# Patient Record
Sex: Female | Born: 1955 | Race: White | Hispanic: No | Smoking: Former smoker
Health system: Southern US, Community
[De-identification: ages and names within clinical notes are randomized; demographics above are authoritative.]

## PROBLEM LIST (undated history)

## (undated) ENCOUNTER — Emergency Department (HOSPITAL_COMMUNITY): Admission: EM | Payer: Self-pay

## (undated) DIAGNOSIS — M069 Rheumatoid arthritis, unspecified: Secondary | ICD-10-CM

## (undated) DIAGNOSIS — Q750 Craniosynostosis: Secondary | ICD-10-CM

## (undated) DIAGNOSIS — K2270 Barrett's esophagus without dysplasia: Secondary | ICD-10-CM

## (undated) DIAGNOSIS — Z8489 Family history of other specified conditions: Secondary | ICD-10-CM

## (undated) DIAGNOSIS — M545 Low back pain, unspecified: Secondary | ICD-10-CM

## (undated) DIAGNOSIS — E7800 Pure hypercholesterolemia, unspecified: Secondary | ICD-10-CM

## (undated) DIAGNOSIS — K589 Irritable bowel syndrome without diarrhea: Secondary | ICD-10-CM

## (undated) DIAGNOSIS — J45909 Unspecified asthma, uncomplicated: Secondary | ICD-10-CM

## (undated) DIAGNOSIS — K219 Gastro-esophageal reflux disease without esophagitis: Secondary | ICD-10-CM

## (undated) DIAGNOSIS — Z8719 Personal history of other diseases of the digestive system: Secondary | ICD-10-CM

## (undated) DIAGNOSIS — IMO0002 Reserved for concepts with insufficient information to code with codable children: Secondary | ICD-10-CM

## (undated) DIAGNOSIS — I10 Essential (primary) hypertension: Secondary | ICD-10-CM

## (undated) DIAGNOSIS — B029 Zoster without complications: Secondary | ICD-10-CM

## (undated) DIAGNOSIS — Z9289 Personal history of other medical treatment: Secondary | ICD-10-CM

## (undated) DIAGNOSIS — K8042 Calculus of bile duct with acute cholecystitis without obstruction: Secondary | ICD-10-CM

## (undated) DIAGNOSIS — F419 Anxiety disorder, unspecified: Secondary | ICD-10-CM

## (undated) DIAGNOSIS — G43909 Migraine, unspecified, not intractable, without status migrainosus: Secondary | ICD-10-CM

## (undated) DIAGNOSIS — Q75009 Craniosynostosis unspecified: Secondary | ICD-10-CM

## (undated) DIAGNOSIS — G8929 Other chronic pain: Secondary | ICD-10-CM

## (undated) DIAGNOSIS — R112 Nausea with vomiting, unspecified: Secondary | ICD-10-CM

## (undated) DIAGNOSIS — H34219 Partial retinal artery occlusion, unspecified eye: Secondary | ICD-10-CM

## (undated) DIAGNOSIS — J449 Chronic obstructive pulmonary disease, unspecified: Secondary | ICD-10-CM

## (undated) DIAGNOSIS — Z9889 Other specified postprocedural states: Secondary | ICD-10-CM

## (undated) DIAGNOSIS — M5126 Other intervertebral disc displacement, lumbar region: Secondary | ICD-10-CM

## (undated) HISTORY — DX: Craniosynostosis unspecified: Q75.009

## (undated) HISTORY — DX: Chronic obstructive pulmonary disease, unspecified: J44.9

## (undated) HISTORY — PX: CRANIECTOMY FOR CRANIOSYNOSTOSIS: SUR323

## (undated) HISTORY — DX: Other intervertebral disc displacement, lumbar region: M51.26

## (undated) HISTORY — PX: APPENDECTOMY: SHX54

## (undated) HISTORY — PX: COLONOSCOPY: SHX174

## (undated) HISTORY — PX: BACK SURGERY: SHX140

## (undated) HISTORY — PX: UPPER GASTROINTESTINAL ENDOSCOPY: SHX188

## (undated) HISTORY — DX: Craniosynostosis: Q75.0

## (undated) HISTORY — PX: POSTERIOR LUMBAR FUSION: SHX6036

## (undated) HISTORY — DX: Partial retinal artery occlusion, unspecified eye: H34.219

## (undated) HISTORY — DX: Calculus of bile duct with acute cholecystitis without obstruction: K80.42

## (undated) HISTORY — DX: Pure hypercholesterolemia, unspecified: E78.00

---

## 1989-08-01 DIAGNOSIS — M5126 Other intervertebral disc displacement, lumbar region: Secondary | ICD-10-CM

## 1989-08-01 HISTORY — DX: Other intervertebral disc displacement, lumbar region: M51.26

## 1989-08-01 HISTORY — PX: BACK SURGERY: SHX140

## 1989-08-01 HISTORY — PX: POSTERIOR LUMBAR FUSION: SHX6036

## 1990-12-01 HISTORY — PX: ABDOMINAL HYSTERECTOMY: SHX81

## 1998-06-12 ENCOUNTER — Encounter: Admission: RE | Admit: 1998-06-12 | Discharge: 1998-09-07 | Payer: Self-pay | Admitting: Anesthesiology

## 1998-09-11 ENCOUNTER — Encounter: Payer: Self-pay | Admitting: Neurosurgery

## 1998-09-11 ENCOUNTER — Ambulatory Visit (HOSPITAL_COMMUNITY): Admission: RE | Admit: 1998-09-11 | Discharge: 1998-09-11 | Payer: Self-pay | Admitting: Neurosurgery

## 1999-08-06 ENCOUNTER — Ambulatory Visit (HOSPITAL_COMMUNITY): Admission: RE | Admit: 1999-08-06 | Discharge: 1999-08-06 | Payer: Self-pay | Admitting: Neurosurgery

## 1999-08-06 ENCOUNTER — Encounter: Payer: Self-pay | Admitting: Neurosurgery

## 2004-12-31 HISTORY — PX: TOTAL HIP ARTHROPLASTY: SHX124

## 2005-12-01 HISTORY — PX: CHOLECYSTECTOMY: SHX55

## 2006-04-07 ENCOUNTER — Encounter: Admission: RE | Admit: 2006-04-07 | Discharge: 2006-04-07 | Payer: Self-pay | Admitting: Neurosurgery

## 2010-07-04 ENCOUNTER — Emergency Department (HOSPITAL_COMMUNITY): Admission: EM | Admit: 2010-07-04 | Discharge: 2010-07-04 | Payer: Self-pay | Admitting: Emergency Medicine

## 2010-12-22 ENCOUNTER — Encounter: Payer: Self-pay | Admitting: Neurosurgery

## 2011-02-07 ENCOUNTER — Emergency Department (HOSPITAL_COMMUNITY): Payer: Worker's Compensation

## 2011-02-07 ENCOUNTER — Emergency Department (HOSPITAL_COMMUNITY)
Admission: EM | Admit: 2011-02-07 | Discharge: 2011-02-07 | Disposition: A | Discharge status: Home / Self Care | Payer: Worker's Compensation | Attending: Emergency Medicine | Admitting: Emergency Medicine

## 2011-02-07 DIAGNOSIS — S5000XA Contusion of unspecified elbow, initial encounter: Secondary | ICD-10-CM | POA: Insufficient documentation

## 2011-02-07 DIAGNOSIS — S139XXA Sprain of joints and ligaments of unspecified parts of neck, initial encounter: Secondary | ICD-10-CM | POA: Insufficient documentation

## 2011-02-07 DIAGNOSIS — W010XXA Fall on same level from slipping, tripping and stumbling without subsequent striking against object, initial encounter: Secondary | ICD-10-CM | POA: Insufficient documentation

## 2011-02-07 DIAGNOSIS — G8929 Other chronic pain: Secondary | ICD-10-CM | POA: Insufficient documentation

## 2011-02-07 DIAGNOSIS — I10 Essential (primary) hypertension: Secondary | ICD-10-CM | POA: Insufficient documentation

## 2011-02-07 DIAGNOSIS — M25519 Pain in unspecified shoulder: Secondary | ICD-10-CM | POA: Insufficient documentation

## 2011-02-07 DIAGNOSIS — S300XXA Contusion of lower back and pelvis, initial encounter: Secondary | ICD-10-CM | POA: Insufficient documentation

## 2011-02-07 DIAGNOSIS — E785 Hyperlipidemia, unspecified: Secondary | ICD-10-CM | POA: Insufficient documentation

## 2011-02-07 DIAGNOSIS — Z79899 Other long term (current) drug therapy: Secondary | ICD-10-CM | POA: Insufficient documentation

## 2011-02-07 DIAGNOSIS — M542 Cervicalgia: Secondary | ICD-10-CM | POA: Insufficient documentation

## 2011-02-07 DIAGNOSIS — Y990 Civilian activity done for income or pay: Secondary | ICD-10-CM | POA: Insufficient documentation

## 2011-02-07 DIAGNOSIS — M79609 Pain in unspecified limb: Secondary | ICD-10-CM | POA: Insufficient documentation

## 2011-02-07 DIAGNOSIS — M25529 Pain in unspecified elbow: Secondary | ICD-10-CM | POA: Insufficient documentation

## 2011-02-07 DIAGNOSIS — M549 Dorsalgia, unspecified: Secondary | ICD-10-CM | POA: Insufficient documentation

## 2011-04-17 ENCOUNTER — Ambulatory Visit
Admission: RE | Admit: 2011-04-17 | Discharge: 2011-04-17 | Disposition: A | Discharge status: Home / Self Care | Payer: Worker's Compensation | Source: Ambulatory Visit | Attending: Family Medicine | Admitting: Family Medicine

## 2011-04-17 ENCOUNTER — Other Ambulatory Visit: Payer: Self-pay | Admitting: Family Medicine

## 2011-04-17 DIAGNOSIS — R05 Cough: Secondary | ICD-10-CM

## 2011-04-17 DIAGNOSIS — R053 Chronic cough: Secondary | ICD-10-CM

## 2011-08-26 ENCOUNTER — Emergency Department (HOSPITAL_BASED_OUTPATIENT_CLINIC_OR_DEPARTMENT_OTHER)
Admission: EM | Admit: 2011-08-26 | Discharge: 2011-08-26 | Disposition: A | Discharge status: Home / Self Care | Payer: No Typology Code available for payment source | Attending: Emergency Medicine | Admitting: Emergency Medicine

## 2011-08-26 DIAGNOSIS — I10 Essential (primary) hypertension: Secondary | ICD-10-CM | POA: Insufficient documentation

## 2011-08-26 DIAGNOSIS — M25519 Pain in unspecified shoulder: Secondary | ICD-10-CM | POA: Insufficient documentation

## 2011-08-26 DIAGNOSIS — F17200 Nicotine dependence, unspecified, uncomplicated: Secondary | ICD-10-CM | POA: Insufficient documentation

## 2011-08-26 DIAGNOSIS — G8929 Other chronic pain: Secondary | ICD-10-CM | POA: Insufficient documentation

## 2011-08-26 DIAGNOSIS — R109 Unspecified abdominal pain: Secondary | ICD-10-CM | POA: Insufficient documentation

## 2011-08-26 HISTORY — DX: Essential (primary) hypertension: I10

## 2011-08-26 MED ORDER — HYDROCODONE-ACETAMINOPHEN 5-325 MG PO TABS: 2.0000 | ORAL_TABLET | ORAL | Status: AC | PRN

## 2011-08-26 NOTE — ED Notes (Signed)
Pt reports she sustained a fall at work last year and has had pain in right leg, bilateral groin and hips and left shoulder

## 2011-08-26 NOTE — ED Provider Notes (Signed)
History     CSN: [id number] Arrival date & time: 08/26/2011  1:04 PM  Chief Complaint  Patient presents with  . Leg Pain  . Shoulder Pain  . Groin Pain    HPI  (Consider location/radiation/quality/duration/timing/severity/associated sxs/prior treatment)  HPI Pt reports multiple chronic pain complaints since falling at work 18 months ago. She has pain in R leg, hips, groin, L shoulder. No new injuries. She has been seen by a PA who has been prescribing her pain meds and xanax, but she is out. She was advised by her attorney to come to the ER. Pain is severe, aching and worse with movement.   Past Medical History  Diagnosis Date  . Hypertension     Past Surgical History  Procedure Date  . Abdominal hysterectomy   . Cholecystectomy   . Joint replacement   . Back surgery     No family history on file.  History  Substance Use Topics  . Smoking status: Current Everyday Smoker -- 1.0 packs/day  . Smokeless tobacco: Not on file  . Alcohol Use: Yes     occasional    OB History    Grav Para Term Preterm Abortions TAB SAB Ect Mult Living                  Review of Systems  Review of Systems All other systems reviewed and are negative except as noted in HPI.   Allergies  Penicillins; Morphine and related; and Sulfa antibiotics  Home Medications   Current Outpatient Rx  Name Route Sig Dispense Refill  . ALPRAZOLAM 0.25 MG PO TABS Oral Take 1 mg by mouth at bedtime as needed.      Marland Kitchen HYDROCODONE-ACETAMINOPHEN 5-500 MG PO TABS Oral Take 1 tablet by mouth 3 (three) times daily.        Physical Exam    BP 122/89  Pulse 103  Temp(Src) 98.5 F (36.9 C) (Oral)  Resp 16  Ht 5\' 2"  (1.575 m)  Wt 127 lb (57.607 kg)  BMI 23.23 kg/m2  SpO2 100%  Physical Exam  Nursing note and vitals reviewed. Constitutional: She is oriented to person, place, and time. She appears well-developed and well-nourished.  HENT:  Head: Normocephalic and atraumatic.  Eyes: EOM are normal.  Pupils are equal, round, and reactive to light.  Neck: Normal range of motion. Neck supple.  Cardiovascular: Normal rate, normal heart sounds and intact distal pulses.   Pulmonary/Chest: Effort normal and breath sounds normal.  Abdominal: Bowel sounds are normal. She exhibits no distension. There is no tenderness.  Musculoskeletal: Normal range of motion. She exhibits no edema and no tenderness.       Pt reports pain with ROM of multiple joints, but no deformity and ROM is normal, no physical exam evidence of acute injury  Neurological: She is alert and oriented to person, place, and time. She has normal strength. No cranial nerve deficit or sensory deficit.  Skin: Skin is warm and dry. No rash noted.  Psychiatric: She has a normal mood and affect.    ED Course  Procedures (including critical care time)    MDM Pt's pain syndrome is chronic. There is no evidence of acute injury. No need for further ED eval. Advised to see PCP and/or Ortho for further evaluation and for long term control of her chronic pain.         Natalija Mavis B. Bernette Mayers, MD 08/26/11 1408

## 2011-10-24 ENCOUNTER — Emergency Department (HOSPITAL_COMMUNITY)
Admission: EM | Admit: 2011-10-24 | Discharge: 2011-10-25 | Discharge status: Against Medical Advice | Payer: Self-pay | Attending: Emergency Medicine | Admitting: Emergency Medicine

## 2011-10-24 DIAGNOSIS — Z0389 Encounter for observation for other suspected diseases and conditions ruled out: Secondary | ICD-10-CM | POA: Insufficient documentation

## 2012-04-14 ENCOUNTER — Ambulatory Visit: Payer: Self-pay

## 2012-04-27 ENCOUNTER — Encounter (HOSPITAL_BASED_OUTPATIENT_CLINIC_OR_DEPARTMENT_OTHER): Payer: Self-pay | Admitting: *Deleted

## 2012-04-27 ENCOUNTER — Emergency Department (HOSPITAL_BASED_OUTPATIENT_CLINIC_OR_DEPARTMENT_OTHER)
Admission: EM | Admit: 2012-04-27 | Discharge: 2012-04-27 | Disposition: A | Discharge status: Home / Self Care | Payer: Self-pay | Attending: Emergency Medicine | Admitting: Emergency Medicine

## 2012-04-27 DIAGNOSIS — J019 Acute sinusitis, unspecified: Secondary | ICD-10-CM

## 2012-04-27 DIAGNOSIS — I10 Essential (primary) hypertension: Secondary | ICD-10-CM | POA: Insufficient documentation

## 2012-04-27 DIAGNOSIS — R062 Wheezing: Secondary | ICD-10-CM | POA: Insufficient documentation

## 2012-04-27 DIAGNOSIS — R51 Headache: Secondary | ICD-10-CM | POA: Insufficient documentation

## 2012-04-27 DIAGNOSIS — F17200 Nicotine dependence, unspecified, uncomplicated: Secondary | ICD-10-CM | POA: Insufficient documentation

## 2012-04-27 DIAGNOSIS — J029 Acute pharyngitis, unspecified: Secondary | ICD-10-CM | POA: Insufficient documentation

## 2012-04-27 DIAGNOSIS — H9209 Otalgia, unspecified ear: Secondary | ICD-10-CM | POA: Insufficient documentation

## 2012-04-27 DIAGNOSIS — J3489 Other specified disorders of nose and nasal sinuses: Secondary | ICD-10-CM | POA: Insufficient documentation

## 2012-04-27 MED ORDER — AZITHROMYCIN 250 MG PO TABS: 250.0000 mg | ORAL_TABLET | Freq: Every day | ORAL | Status: AC

## 2012-04-27 MED ORDER — ALBUTEROL SULFATE HFA 108 (90 BASE) MCG/ACT IN AERS
2.0000 | INHALATION_SPRAY | Freq: Once | RESPIRATORY_TRACT | Status: AC
  Administered 2012-04-27: 2 via RESPIRATORY_TRACT
  Filled 2012-04-27: qty 6.7

## 2012-04-27 MED ORDER — AZITHROMYCIN 250 MG PO TABS: 250.0000 mg | ORAL_TABLET | Freq: Every day | ORAL | Status: DC

## 2012-04-27 NOTE — ED Provider Notes (Signed)
History     CSN: [id number]  Arrival date & time 04/27/12  4098   First MD Initiated Contact with Patient 04/27/12 2213      Chief Complaint  Patient presents with  . Facial Pain    (Consider location/radiation/quality/duration/timing/severity/associated sxs/prior treatment) Patient is a 56 y.o. female presenting with URI. The history is provided by the patient. No language interpreter was used.  URI The primary symptoms include fever, headaches, ear pain, sore throat, cough and wheezing. The current episode started 3 to 5 days ago. This is a new problem. The problem has been gradually worsening.  The cough began 3 to 5 days ago. The cough is new. The cough is productive. The sputum is green.  Symptoms associated with the illness include facial pain, sinus pressure, congestion and rhinorrhea. The illness is not associated with chills.  Pt complains of sinus pain.  Pt reports she has had sinus infections in the past  Past Medical History  Diagnosis Date  . Hypertension     Past Surgical History  Procedure Date  . Abdominal hysterectomy   . Cholecystectomy   . Joint replacement   . Back surgery     No family history on file.  History  Substance Use Topics  . Smoking status: Current Everyday Smoker -- 1.0 packs/day  . Smokeless tobacco: Not on file  . Alcohol Use: Yes     occasional    OB History    Grav Para Term Preterm Abortions TAB SAB Ect Mult Living                  Review of Systems  Constitutional: Positive for fever. Negative for chills.  HENT: Positive for ear pain, congestion, sore throat, rhinorrhea and sinus pressure.   Respiratory: Positive for cough and wheezing.   Neurological: Positive for headaches.  All other systems reviewed and are negative.    Allergies  Penicillins; Demerol; Morphine and related; Sulfa antibiotics; and Benadryl  Home Medications   Current Outpatient Rx  Name Route Sig Dispense Refill  . BUTALBITAL-APAP-CAFFEINE  50-325-40 MG PO TABS Oral Take 1 tablet by mouth daily as needed. For migraines    . CIMETIDINE 800 MG PO TABS Oral Take 800 mg by mouth 3 (three) times daily as needed. For acid reflux    . LIDOCAINE 5 % EX PTCH Transdermal Place 1 patch onto the skin daily. Remove & Discard patch within 12 hours or as directed by MD    . ADULT MULTIVITAMIN W/MINERALS CH Oral Take 1 tablet by mouth daily.    Marland Kitchen OMEPRAZOLE 40 MG PO CPDR Oral Take 40 mg by mouth 2 (two) times daily.    Marland Kitchen PRESCRIPTION MEDICATION Oral Take 1 tablet by mouth daily. Blood pressure medication    . PRESCRIPTION MEDICATION Oral Take 1 tablet by mouth daily. Cholesterol medication    . VITAMIN D (ERGOCALCIFEROL) PO Oral Take 1 tablet by mouth daily.      BP 161/83  Pulse 96  Temp(Src) 98 F (36.7 C) (Oral)  Resp 22  SpO2 99%  Physical Exam  Nursing note and vitals reviewed. Constitutional: She is oriented to person, place, and time. She appears well-developed and well-nourished.  HENT:  Head: Normocephalic and atraumatic.  Right Ear: External ear normal.  Left Ear: External ear normal.  Nose: Nose normal.  Mouth/Throat: Oropharynx is clear and moist.       Tender maxillary sinuses  Eyes: Conjunctivae and EOM are normal. Pupils are equal, round, and  reactive to light.  Neck: Normal range of motion. Neck supple.  Cardiovascular: Normal rate and normal heart sounds.   Pulmonary/Chest: Effort normal. She has wheezes.  Abdominal: Soft.  Musculoskeletal: Normal range of motion.  Neurological: She is alert and oriented to person, place, and time. She has normal reflexes.  Skin: Skin is warm.  Psychiatric: She has a normal mood and affect.    ED Course  Procedures (including critical care time)  Labs Reviewed - No data to display No results found.   No diagnosis found.    MDM  Pt request z pack,  Pt given 2 puffs albuterol here.   Pt advised to see her MD for recheck        Lonia Skinner [place], [place] 04/27/12 2226

## 2012-04-27 NOTE — ED Notes (Signed)
Nasal drainage, facial pressure, cough, ear pain, and sore throat x 5 days. No relief with otc medication. Hx of sinusitis.

## 2012-04-27 NOTE — Discharge Instructions (Signed)

## 2012-04-27 NOTE — ED Provider Notes (Signed)
Medical screening examination/treatment/procedure(s) were performed by non-physician practitioner and as supervising physician I was immediately available for consultation/collaboration.   Rolan Bucco, MD 04/27/12 [phone]

## 2012-04-28 NOTE — ED Notes (Signed)
Pt called concerned about allergic reaction to medications. Pt states she was itching all over while in the ED after the inhaler, but did not notify anyone. Pt states she ate on the way home and then took the zithromax.with one episode of vomiting. Pt has vistaril at home, advised pt to take vistaril for the itching, avoid the inhaler and try to take the zithromax again tomorrow. Pt instructed to contact PMD tomorrow if symptoms persist.

## 2012-05-20 ENCOUNTER — Ambulatory Visit (INDEPENDENT_AMBULATORY_CARE_PROVIDER_SITE_OTHER): Payer: No Typology Code available for payment source | Admitting: Family Medicine

## 2012-05-20 ENCOUNTER — Encounter: Payer: Self-pay | Admitting: Family Medicine

## 2012-05-20 VITALS — BP 142/92 | HR 94 | Ht 60.83 in | Wt 130.0 lb

## 2012-05-20 DIAGNOSIS — F419 Anxiety disorder, unspecified: Secondary | ICD-10-CM

## 2012-05-20 DIAGNOSIS — E7800 Pure hypercholesterolemia, unspecified: Secondary | ICD-10-CM

## 2012-05-20 DIAGNOSIS — R0789 Other chest pain: Secondary | ICD-10-CM

## 2012-05-20 DIAGNOSIS — M549 Dorsalgia, unspecified: Secondary | ICD-10-CM

## 2012-05-20 DIAGNOSIS — G8929 Other chronic pain: Secondary | ICD-10-CM | POA: Insufficient documentation

## 2012-05-20 DIAGNOSIS — G2581 Restless legs syndrome: Secondary | ICD-10-CM

## 2012-05-20 DIAGNOSIS — G4700 Insomnia, unspecified: Secondary | ICD-10-CM

## 2012-05-20 DIAGNOSIS — I10 Essential (primary) hypertension: Secondary | ICD-10-CM

## 2012-05-20 DIAGNOSIS — K2270 Barrett's esophagus without dysplasia: Secondary | ICD-10-CM

## 2012-05-20 DIAGNOSIS — F17209 Nicotine dependence, unspecified, with unspecified nicotine-induced disorders: Secondary | ICD-10-CM

## 2012-05-20 DIAGNOSIS — F17200 Nicotine dependence, unspecified, uncomplicated: Secondary | ICD-10-CM

## 2012-05-20 DIAGNOSIS — R071 Chest pain on breathing: Secondary | ICD-10-CM

## 2012-05-20 DIAGNOSIS — K449 Diaphragmatic hernia without obstruction or gangrene: Secondary | ICD-10-CM

## 2012-05-20 DIAGNOSIS — F341 Dysthymic disorder: Secondary | ICD-10-CM

## 2012-05-20 NOTE — Progress Notes (Signed)
  Subjective:    Patient ID: Diana [name], female    DOB: [birth year], 56 y.o.   MRN: [id number]  HPI   Chest Tenderness: centrally located, worse with deep inspiration, not related to activity, does not radiate to shoulder or jaw, not associated with diaphoresis or nausea, no history of MI or trauma   Review of Systems Positive for chronic back pain Negative all other systems    Objective:   Physical Exam BP 142/92  Pulse 94  Ht 5' 0.83" (1.545 m)  Wt 130 lb (58.968 kg)  BMI 24.70 kg/m2 Gen: alert, oriented x 4, middle age WF, non distressed CV: RRR, no murmurs Lungs: CTA-B Chest: exquisite tenderness to palpation along right sterno-clavicular border      Assessment & Plan:  56 y.o. F who is new to our clinic who presents for her first visit and has chest tenderness which may be representative of costochondritis.

## 2012-05-20 NOTE — Patient Instructions (Addendum)
Dear Mrs. Diana [name],   Thank you for coming to clinic today. Please read below regarding the issues that we discussed.   1. Chest Tenderness - I believe that this is costochondritis, which is inflammation of the joints between your sternum and ribs. You can take up to 800 mg three times a day. I would take at least 400 mg three times a day for 2 weeks.   2. Blood Test - I will read about the concern for stryker hips and follow-up at the next visit.   Please follow up in clinic in 3 weeks . Please call earlier if you have any questions or concerns.   Sincerely,   Dr. [name] Sawyer

## 2012-05-28 DIAGNOSIS — E785 Hyperlipidemia, unspecified: Secondary | ICD-10-CM | POA: Insufficient documentation

## 2012-05-28 DIAGNOSIS — R0789 Other chest pain: Secondary | ICD-10-CM | POA: Insufficient documentation

## 2012-05-28 DIAGNOSIS — F17209 Nicotine dependence, unspecified, with unspecified nicotine-induced disorders: Secondary | ICD-10-CM | POA: Insufficient documentation

## 2012-05-28 DIAGNOSIS — G4700 Insomnia, unspecified: Secondary | ICD-10-CM | POA: Insufficient documentation

## 2012-05-28 DIAGNOSIS — E7800 Pure hypercholesterolemia, unspecified: Secondary | ICD-10-CM

## 2012-05-28 DIAGNOSIS — F419 Anxiety disorder, unspecified: Secondary | ICD-10-CM | POA: Insufficient documentation

## 2012-05-28 DIAGNOSIS — I10 Essential (primary) hypertension: Secondary | ICD-10-CM | POA: Insufficient documentation

## 2012-05-28 DIAGNOSIS — G2581 Restless legs syndrome: Secondary | ICD-10-CM | POA: Insufficient documentation

## 2012-05-28 HISTORY — DX: Pure hypercholesterolemia, unspecified: E78.00

## 2012-05-28 NOTE — Assessment & Plan Note (Signed)
Although this is my first time meeting the patient, I know that she has several chronic pain issues. This always makes is harder to delineate possible new acute problems. Currently, her chest pain is so localized and reproducible, that it will be treated as costochondritis. She should take ibuprofen 800 mg TID for 7 days. Follow-up in 4 weeks to reassess.

## 2012-06-07 ENCOUNTER — Ambulatory Visit (INDEPENDENT_AMBULATORY_CARE_PROVIDER_SITE_OTHER): Payer: Self-pay | Admitting: Family Medicine

## 2012-06-07 ENCOUNTER — Encounter: Payer: Self-pay | Admitting: Family Medicine

## 2012-06-07 VITALS — BP 146/85 | HR 86 | Ht 62.0 in | Wt 129.0 lb

## 2012-06-07 DIAGNOSIS — F341 Dysthymic disorder: Secondary | ICD-10-CM

## 2012-06-07 DIAGNOSIS — G2581 Restless legs syndrome: Secondary | ICD-10-CM

## 2012-06-07 DIAGNOSIS — R252 Cramp and spasm: Secondary | ICD-10-CM

## 2012-06-07 DIAGNOSIS — E7800 Pure hypercholesterolemia, unspecified: Secondary | ICD-10-CM

## 2012-06-07 DIAGNOSIS — G8929 Other chronic pain: Secondary | ICD-10-CM

## 2012-06-07 DIAGNOSIS — K589 Irritable bowel syndrome without diarrhea: Secondary | ICD-10-CM

## 2012-06-07 DIAGNOSIS — F17209 Nicotine dependence, unspecified, with unspecified nicotine-induced disorders: Secondary | ICD-10-CM

## 2012-06-07 DIAGNOSIS — R0789 Other chest pain: Secondary | ICD-10-CM

## 2012-06-07 DIAGNOSIS — M549 Dorsalgia, unspecified: Secondary | ICD-10-CM

## 2012-06-07 DIAGNOSIS — E785 Hyperlipidemia, unspecified: Secondary | ICD-10-CM

## 2012-06-07 DIAGNOSIS — F329 Major depressive disorder, single episode, unspecified: Secondary | ICD-10-CM

## 2012-06-07 DIAGNOSIS — F419 Anxiety disorder, unspecified: Secondary | ICD-10-CM

## 2012-06-07 DIAGNOSIS — F17200 Nicotine dependence, unspecified, uncomplicated: Secondary | ICD-10-CM

## 2012-06-07 MED ORDER — LOVASTATIN 10 MG PO TABS: 10.0000 mg | ORAL_TABLET | Freq: Every day | ORAL | Status: DC

## 2012-06-07 MED ORDER — HYDROCODONE-ACETAMINOPHEN 10-325 MG PO TABS: 1.0000 | ORAL_TABLET | Freq: Four times a day (QID) | ORAL | Status: DC | PRN

## 2012-06-07 NOTE — Assessment & Plan Note (Signed)
Has tried Chantix in past according to records from former PCP Philemon Kingdom, MD.

## 2012-06-07 NOTE — Assessment & Plan Note (Signed)
Check CK to rule-out statin myopathy. 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

## 2012-06-07 NOTE — Assessment & Plan Note (Signed)
No time for a full work up as this was the last of many chronic issues that she wanted adressed today. No red flags for MI. MSK still possible and patient most likely has a pain syndrome. Possibly GI related. Consider GI referral. Readdress at future visit.

## 2012-06-07 NOTE — Progress Notes (Signed)
  Subjective:    Patient ID: Diana [name], female    DOB: [birth year], 56 y.o.   MRN: [id number]  HPI  56 year old patient presenting with many complaints and concerns:  1. 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 The symptoms include a rash on her legs, cramping the back of the calves, hands and feet; spots in her vision and headache that she describes as distinct from her migraines. The chronological sequence of symptoms is not well noted. the vision problems are described as dots in front of the eye, described as a black space that is very transient. It last occurred 3 months ago and lasted a few minutes. It was painful, relieved after sleeping and taking alprazolam, and was accompanied by nausea and vomiting. It was not monocular blindness. At baseline she wears glasses and has poor vision at night.   2. Chest Pain - Seen for this at last visit and working diagnosis was costochondritis; patient tried on ibuprofen 800 mg TID, pain not relieved, comes in episodes that last from 12 hours to several days, located under right breast, denies shortness of breath, radiations, moderately relieved by tagament, exacerbated by nothing; has hx of work-up for MI multiples times that she reports as negative   3. Chronic Lower Back Pain - The patient is requesting a refill for hydrocodone-acetaminophen which she takes for lower back pain.   Back Pain: Location: lower Duration: since mid 1990's Quality: 8/10 Preceding Events: hx falls. hx trauma Alleviating Factors: percocet-acetaminophen Exacerbating Factors: movement Hx of intervention: L4-L5 Cage fusion in 1998 surgery, yes PT Hx of imaging: many - myelogram in 2007 negative for impingement in lumbar spine; X ray 2011 negative for acute changes  Red Flags: no recent  falls, no numbness and tingling, no impaired bowel or bladder function    Review of Systems Positive for left neck and shoulder pain (former physician thought it may be related to rotator cuff)    Objective:   Physical Exam  Constitutional: She is oriented to person, place, and time. She appears well-developed and well-nourished. No distress.  HENT:  Head: Normocephalic and atraumatic.  Eyes: Conjunctivae, EOM and lids are normal. Pupils are equal, round, and reactive to light.  Fundoscopic exam:      The right eye shows no arteriolar narrowing, no AV nicking, no hemorrhage and no papilledema.       The left eye shows no arteriolar narrowing, no AV nicking, no hemorrhage and no papilledema.  Musculoskeletal:       Hips non tender bilaterally without edema or erythema   Neurological: She is alert and oriented to person, place, and time. She has normal reflexes.  Skin: Skin is warm and dry. She is not diaphoretic.   BP 146/85  Pulse 86  Ht 5\' 2"  (1.575 m)  Wt 129 lb (58.514 kg)  BMI 23.59 kg/m2        Assessment & Plan:  56 year old female with many medical complaints that she is trying to relate to her hip implant, but there is an extremely low likeliod so no work-up will be performed at the moment for that.

## 2012-06-07 NOTE — Assessment & Plan Note (Signed)
Check direct LDL and refill statin.

## 2012-06-07 NOTE — Assessment & Plan Note (Signed)
The patient claims to have had this since age 56. Medical records show previous treatment with Dicyclomine.

## 2012-06-07 NOTE — Assessment & Plan Note (Signed)
Obtain UDS to screen for recreational drugs. Refill norco. Forgot to have the patient sign a pain contract. Have the patient sign contract and perform functional assessment at the next visit.

## 2012-06-07 NOTE — Patient Instructions (Addendum)
Dear Diana [name],   Thank you for coming to clinic today. Please read below regarding the issues that we discussed.   1. Lab Results - I will send you a letter with there results of your lab.   2. Chest Pain - We will address this at a future visit.   Please follow up in clinic in 4 weeks . Please call earlier if you have any questions or concerns.   Sincerely,   Dr. [name] Sawyer

## 2012-06-08 LAB — DRUG SCR UR, PAIN MGMT, REFLEX CONF
Benzodiazepines.: NEGATIVE
Cocaine Metabolites: NEGATIVE
Creatinine,U: 81.42 mg/dL
Marijuana Metabolite: NEGATIVE
Opiates: NEGATIVE
Phencyclidine (PCP): NEGATIVE
Propoxyphene: NEGATIVE

## 2012-06-12 ENCOUNTER — Encounter: Payer: Self-pay | Admitting: Family Medicine

## 2012-06-30 ENCOUNTER — Ambulatory Visit (HOSPITAL_COMMUNITY)
Admission: RE | Admit: 2012-06-30 | Discharge: 2012-06-30 | Disposition: A | Discharge status: Home / Self Care | Payer: Self-pay | Source: Ambulatory Visit | Attending: Family Medicine | Admitting: Family Medicine

## 2012-06-30 ENCOUNTER — Ambulatory Visit (INDEPENDENT_AMBULATORY_CARE_PROVIDER_SITE_OTHER): Payer: Self-pay | Admitting: Family Medicine

## 2012-06-30 ENCOUNTER — Encounter: Payer: Self-pay | Admitting: Family Medicine

## 2012-06-30 VITALS — BP 129/82 | HR 70 | Ht 61.0 in | Wt 133.0 lb

## 2012-06-30 DIAGNOSIS — E7800 Pure hypercholesterolemia, unspecified: Secondary | ICD-10-CM

## 2012-06-30 DIAGNOSIS — M549 Dorsalgia, unspecified: Secondary | ICD-10-CM

## 2012-06-30 DIAGNOSIS — G8929 Other chronic pain: Secondary | ICD-10-CM

## 2012-06-30 DIAGNOSIS — Z9181 History of falling: Secondary | ICD-10-CM | POA: Insufficient documentation

## 2012-06-30 DIAGNOSIS — M25559 Pain in unspecified hip: Secondary | ICD-10-CM | POA: Insufficient documentation

## 2012-06-30 DIAGNOSIS — M545 Low back pain, unspecified: Secondary | ICD-10-CM | POA: Insufficient documentation

## 2012-06-30 DIAGNOSIS — E785 Hyperlipidemia, unspecified: Secondary | ICD-10-CM

## 2012-06-30 DIAGNOSIS — Z96649 Presence of unspecified artificial hip joint: Secondary | ICD-10-CM | POA: Insufficient documentation

## 2012-06-30 MED ORDER — HYDROCODONE-ACETAMINOPHEN 10-325 MG PO TABS: 1.0000 | ORAL_TABLET | Freq: Three times a day (TID) | ORAL | Status: DC | PRN

## 2012-06-30 MED ORDER — LOVASTATIN 20 MG PO TABS: 20.0000 mg | ORAL_TABLET | Freq: Every day | ORAL | Status: DC

## 2012-06-30 MED ORDER — LOVASTATIN 10 MG PO TABS: 20.0000 mg | ORAL_TABLET | Freq: Every day | ORAL | Status: DC

## 2012-06-30 NOTE — Assessment & Plan Note (Addendum)
Worsening pain and needing to stay in bed. Taking Norco 10-325 BID or TID PRN, lidoderm patch, and voltaren. Patient also has recent falls therefore ordered a repeat imaging of her lumbar spine and right hip. Additionally have given her a prescription for Norco 10-325 3 times a day when necessary for 2 months. No other additional narcotic medication should be prescribing this period Unless there is an acute injury.

## 2012-06-30 NOTE — Progress Notes (Signed)
  Subjective:    Patient ID: Diana [name], female    DOB: 06-Mar-1956, 56 y.o.   MRN: [id number]  HPI  56 year old female chronic back pain and high cholesterol who presents for evaluation.   Back Pain: Location: lower back and right hip Duration: years, acute on chronic Starting in 1998 she had L4-L5 fusion. She reports worsening pain and multiple falls. Quality: 6/10 Preceding Events: Yes falls. Previous trauma Alleviating Factors: Rest, Norco, Lidoderm Exacerbating Factors: Activity Intervention: L4-L5 Cage fusion in 1998 surgery, yes PT, These massage and acupuncture in the past when she can afford it, has also tried Neurontin but did not tolerate it.  Hx of imaging: many - myelogram in 2007 negative for impingement in lumbar spine; X ray 2011 negative for acute changes  Red Flags: recent falls, tingling Since before the surgery 1998, no impaired bowel or bladder function    2. High cholesterol- the patient recently had an LDL measured 185. She is currently taking lovastatin 10 mg daily. She is not missing doses. She does note a history of CVA in her mother.   Review of Systems  All other systems reviewed and are negative.       Objective:   Physical Exam  Constitutional: She is oriented to person, place, and time. She appears well-developed and well-nourished. No distress.  HENT:  Head: Normocephalic and atraumatic.  Eyes: Conjunctivae are normal. Pupils are equal, round, and reactive to light.  Cardiovascular: Normal rate, regular rhythm and normal heart sounds.   Pulmonary/Chest: Effort normal and breath sounds normal.  Musculoskeletal:       Right hip: She exhibits decreased strength, tenderness and bony tenderness. She exhibits normal range of motion.       Lumbar back: She exhibits tenderness and bony tenderness. She exhibits normal range of motion, no edema and no deformity.       Back:  Neurological: She is alert and oriented to person, place, and time. She has  normal reflexes.  Skin: She is not diaphoretic.    BP 129/82  Pulse 70  Ht 5\' 1"  (1.549 m)  Wt 133 lb (60.328 kg)  BMI 25.13 kg/m2      Assessment & Plan:

## 2012-06-30 NOTE — Assessment & Plan Note (Signed)
Recent LDL is greater 180. Therefore we'll increase her lovastatin to 20 mg daily. This will likely not be sufficient and I see [place] increasing it to 40 within several months. I'll recheck her lipids a fasting lipid panel in 3-4 months.

## 2012-08-27 ENCOUNTER — Ambulatory Visit (INDEPENDENT_AMBULATORY_CARE_PROVIDER_SITE_OTHER): Payer: Self-pay | Admitting: Family Medicine

## 2012-08-27 ENCOUNTER — Encounter: Payer: Self-pay | Admitting: Family Medicine

## 2012-08-27 VITALS — BP 132/84 | HR 68 | Temp 98.5°F | Ht 61.0 in | Wt 133.0 lb

## 2012-08-27 DIAGNOSIS — N898 Other specified noninflammatory disorders of vagina: Secondary | ICD-10-CM

## 2012-08-27 DIAGNOSIS — G8929 Other chronic pain: Secondary | ICD-10-CM

## 2012-08-27 DIAGNOSIS — Z23 Encounter for immunization: Secondary | ICD-10-CM

## 2012-08-27 DIAGNOSIS — N952 Postmenopausal atrophic vaginitis: Secondary | ICD-10-CM

## 2012-08-27 DIAGNOSIS — G2581 Restless legs syndrome: Secondary | ICD-10-CM

## 2012-08-27 DIAGNOSIS — Z720 Tobacco use: Secondary | ICD-10-CM

## 2012-08-27 DIAGNOSIS — F17209 Nicotine dependence, unspecified, with unspecified nicotine-induced disorders: Secondary | ICD-10-CM

## 2012-08-27 DIAGNOSIS — F17200 Nicotine dependence, unspecified, uncomplicated: Secondary | ICD-10-CM

## 2012-08-27 DIAGNOSIS — M549 Dorsalgia, unspecified: Secondary | ICD-10-CM

## 2012-08-27 LAB — POCT URINALYSIS DIPSTICK
Bilirubin, UA: NEGATIVE
Blood, UA: NEGATIVE
Glucose, UA: NEGATIVE
Nitrite, UA: NEGATIVE
Urobilinogen, UA: 0.2

## 2012-08-27 LAB — POCT WET PREP (WET MOUNT)

## 2012-08-27 MED ORDER — ESTROGENS, CONJUGATED 0.625 MG/GM VA CREA: TOPICAL_CREAM | VAGINAL | Status: DC

## 2012-08-27 MED ORDER — VARENICLINE TARTRATE 0.5 MG PO TABS: 0.5000 mg | ORAL_TABLET | Freq: Two times a day (BID) | ORAL | Status: DC

## 2012-08-27 MED ORDER — CLONAZEPAM 0.5 MG PO TABS: 0.5000 mg | ORAL_TABLET | Freq: Two times a day (BID) | ORAL | Status: DC | PRN

## 2012-08-27 MED ORDER — HYDROCODONE-ACETAMINOPHEN 10-325 MG PO TABS: 1.0000 | ORAL_TABLET | Freq: Three times a day (TID) | ORAL | Status: DC | PRN

## 2012-08-28 DIAGNOSIS — N952 Postmenopausal atrophic vaginitis: Secondary | ICD-10-CM | POA: Insufficient documentation

## 2012-08-28 LAB — DRUG SCREEN, URINE
Amphetamine Screen, Ur: NEGATIVE
Barbiturate Quant, Ur: POSITIVE — AB
Cocaine Metabolites: NEGATIVE
Marijuana Metabolite: NEGATIVE
Opiates: POSITIVE — AB
Phencyclidine (PCP): NEGATIVE

## 2012-08-28 NOTE — Assessment & Plan Note (Signed)
Start premarin cream twice weekly with OTC lubricant as well. Follow up in 1 month.

## 2012-08-28 NOTE — Assessment & Plan Note (Signed)
Refill Norco 10-325 for BID use. 3 months prescription given.

## 2012-08-28 NOTE — Progress Notes (Signed)
  Subjective:    Patient ID: Diana [name], female    DOB: [birth year], 56 y.o.   MRN: [id number]  HPI 56 year old F with chronic MSK pain, restless leg syndrome and tobacco use disorder among other problems who presents for evaluation.   1. Smoking Cessation - Has > 20 pack year history. Smoke 0.5 ppd. Has quit previously using Chantix and felt that is it worked very well. Denies any side effects of mood changes, hallucinations, or problems with decision making while on Chantix. Would like to restart Chantix.  2. Chronic MSK Pain - Pain in lower back and right hip/groin. Has a history of lumbar spinal fusion and right hip total arthroplasty. Currently takes hydrocodone/acetominophen 10-325 BID for pain and has been on this regimen for several year prior to being my patient. Pain is mostly located in the groin, but not interrupting her ALD's since last visit.  Would like a refill on the hydrocodone/acetominophen.   3. Restless Leg Syndrome -  Treated by previous PCP: Diana Height, PA with hydroxyzine, vicodin and clonazepam. Last saw this provider 03/16/12. Appears to have been tried on Zyprexa and Requip in the past and taken off 2/2 side effects. The patient was also tried on Mirapex by pervious PCP but patient stopped that b/c she says it caused her to gamble and spend excessive money. She most recently took her clonazapem two nights ago and needs a refill.   4. Vaginal Discharge - Yellow color. 2 weeks duration. Associated with itching and dryness of the vagina. Is sexually active with one partner - her 1st husband to whom she is no longer married. Notes pain with intercourse secondary to dryness. Has not use OTC lube. Does not use condoms. Hysterectomy > 20 years ago. Denies malodorous d/c, fever, or abdominal pain.   Review of Systems Negative for chest pain, SOB, urinary retention, saddle anesthesia    Objective:   Physical Exam BP 132/84  Pulse 68  Temp 98.5 F (36.9 C) (Oral)  Ht 5\' 1"   (1.549 m)  Wt 133 lb (60.328 kg)  BMI 25.13 kg/m2 Gen: middle aged WF, appears older than stated age, non distressed MSK: TTP of lumbar spine and hips bilaterally, no obvious deformities, erythema or swelling GU: normal external genitalia, no inguinal lymphadenopathy, no lesions, normal vagina without blood or discharge, mild atrophy of vaginal tissue     Assessment & Plan:  56 year old F with complaints of vaginal discharge and atrophic vaginitis as well as chronic back pain and restless leg syndrome.

## 2012-08-28 NOTE — Assessment & Plan Note (Signed)
Patient to restart Chantix. Successful with this in past. Follow up in 1 month.

## 2012-08-28 NOTE — Assessment & Plan Note (Signed)
Continue current regimen of Clonazepam. Obtain urine drug screen today.

## 2012-12-23 ENCOUNTER — Ambulatory Visit (INDEPENDENT_AMBULATORY_CARE_PROVIDER_SITE_OTHER): Payer: Self-pay | Admitting: Family Medicine

## 2012-12-23 ENCOUNTER — Encounter: Payer: Self-pay | Admitting: Family Medicine

## 2012-12-23 ENCOUNTER — Telehealth: Payer: Self-pay | Admitting: Family Medicine

## 2012-12-23 VITALS — BP 139/83 | HR 90 | Temp 98.5°F | Ht 61.0 in | Wt 139.0 lb

## 2012-12-23 DIAGNOSIS — J209 Acute bronchitis, unspecified: Secondary | ICD-10-CM

## 2012-12-23 DIAGNOSIS — F17209 Nicotine dependence, unspecified, with unspecified nicotine-induced disorders: Secondary | ICD-10-CM

## 2012-12-23 DIAGNOSIS — F17200 Nicotine dependence, unspecified, uncomplicated: Secondary | ICD-10-CM

## 2012-12-23 DIAGNOSIS — R05 Cough: Secondary | ICD-10-CM

## 2012-12-23 DIAGNOSIS — R059 Cough, unspecified: Secondary | ICD-10-CM

## 2012-12-23 DIAGNOSIS — J329 Chronic sinusitis, unspecified: Secondary | ICD-10-CM

## 2012-12-23 MED ORDER — ALBUTEROL SULFATE (2.5 MG/3ML) 0.083% IN NEBU
2.5000 mg | INHALATION_SOLUTION | Freq: Once | RESPIRATORY_TRACT | Status: AC
  Administered 2012-12-23: 2.5 mg via RESPIRATORY_TRACT

## 2012-12-23 MED ORDER — PREDNISONE 20 MG PO TABS: 40.0000 mg | ORAL_TABLET | Freq: Every day | ORAL | Status: DC

## 2012-12-23 MED ORDER — IPRATROPIUM BROMIDE 0.02 % IN SOLN
0.5000 mg | Freq: Once | RESPIRATORY_TRACT | Status: AC
  Administered 2012-12-23: 0.5 mg via RESPIRATORY_TRACT

## 2012-12-23 MED ORDER — ALBUTEROL SULFATE HFA 108 (90 BASE) MCG/ACT IN AERS: 2.0000 | INHALATION_SPRAY | Freq: Four times a day (QID) | RESPIRATORY_TRACT | Status: DC | PRN

## 2012-12-23 MED ORDER — AZITHROMYCIN 250 MG PO TABS
500.0000 mg | ORAL_TABLET | Freq: Every day | ORAL | Status: DC
Start: 2012-12-23 — End: 2013-01-13

## 2012-12-23 MED ORDER — GUAIFENESIN-CODEINE 200-10 MG/5ML PO LIQD: 5.0000 mL | Freq: Three times a day (TID) | ORAL | Status: DC | PRN

## 2012-12-23 NOTE — Telephone Encounter (Signed)
Cough med called to [facility] per Dr Cristal Ford

## 2012-12-23 NOTE — Patient Instructions (Addendum)
I think you have a sinus infection. This may trigger your asthma and bronchitis. Use albuterol inhaler every 2-4 hours for next day. Start taking azithromycin. If you have fevers, worsened symptoms or do not improve then call doctor for another appointment. If you have trouble breathing or chest pain then return to ER.   Sinusitis Sinusitis is redness, soreness, and swelling (inflammation) of the paranasal sinuses. Paranasal sinuses are air pockets within the bones of your face (beneath the eyes, the middle of the forehead, or above the eyes). In healthy paranasal sinuses, mucus is able to drain out, and air is able to circulate through them by way of your nose. However, when your paranasal sinuses are inflamed, mucus and air can become trapped. This can allow bacteria and other germs to grow and cause infection. Sinusitis can develop quickly and last only a short time (acute) or continue over a long period (chronic). Sinusitis that lasts for more than 12 weeks is considered chronic.  CAUSES  Causes of sinusitis include:  Allergies.  Structural abnormalities, such as displacement of the cartilage that separates your nostrils (deviated septum), which can decrease the air flow through your nose and sinuses and affect sinus drainage.  Functional abnormalities, such as when the small hairs (cilia) that line your sinuses and help remove mucus do not work properly or are not present. SYMPTOMS  Symptoms of acute and chronic sinusitis are the same. The primary symptoms are pain and pressure around the affected sinuses. Other symptoms include:  Upper toothache.  Earache.  Headache.  Bad breath.  Decreased sense of smell and taste.  A cough, which worsens when you are lying flat.  Fatigue.  Fever.  Thick drainage from your nose, which often is green and may contain pus (purulent).  Swelling and warmth over the affected sinuses. DIAGNOSIS  Your caregiver will perform a physical exam.  During the exam, your caregiver may:  Look in your nose for signs of abnormal growths in your nostrils (nasal polyps).  Tap over the affected sinus to check for signs of infection.  View the inside of your sinuses (endoscopy) with a special imaging device with a light attached (endoscope), which is inserted into your sinuses. If your caregiver suspects that you have chronic sinusitis, one or more of the following tests may be recommended:  Allergy tests.  Nasal culture A sample of mucus is taken from your nose and sent to a lab and screened for bacteria.  Nasal cytology A sample of mucus is taken from your nose and examined by your caregiver to determine if your sinusitis is related to an allergy. TREATMENT  Most cases of acute sinusitis are related to a viral infection and will resolve on their own within 10 days. Sometimes medicines are prescribed to help relieve symptoms (pain medicine, decongestants, nasal steroid sprays, or saline sprays).  However, for sinusitis related to a bacterial infection, your caregiver will prescribe antibiotic medicines. These are medicines that will help kill the bacteria causing the infection.  Rarely, sinusitis is caused by a fungal infection. In theses cases, your caregiver will prescribe antifungal medicine. For some cases of chronic sinusitis, surgery is needed. Generally, these are cases in which sinusitis recurs more than 3 times per year, despite other treatments. HOME CARE INSTRUCTIONS   Drink plenty of water. Water helps thin the mucus so your sinuses can drain more easily.  Use a humidifier.  Inhale steam 3 to 4 times a day (for example, sit in the bathroom with  the shower running).  Apply a warm, moist washcloth to your face 3 to 4 times a day, or as directed by your caregiver.  Use saline nasal sprays to help moisten and clean your sinuses.  Take over-the-counter or prescription medicines for pain, discomfort, or fever only as directed by  your caregiver. SEEK IMMEDIATE MEDICAL CARE IF:  You have increasing pain or severe headaches.  You have nausea, vomiting, or drowsiness.  You have swelling around your face.  You have vision problems.  You have a stiff neck.  You have difficulty breathing. MAKE SURE YOU:   Understand these instructions.  Will watch your condition.  Will get help right away if you are not doing well or get worse. Document Released: 11/17/2005 Document Revised: 02/09/2012 Document Reviewed: 12/02/2011 [facility] Patient Information 2013 [place], [place].

## 2012-12-23 NOTE — Progress Notes (Signed)
  Subjective:    Patient ID: Diana [name], female    DOB: [birth year], 57 y.o.   MRN: [id number]  HPI  1. Cough. Started nearly 2 weeks ago. Seen at [facility] 4 days ago and diagnosed with flu, given cough syrup with codeine. Today she is still having cough productive of purulent yellow mucus, sinus pressure and headache which is not improved. She was having fevers up to 102, the last one was 2 days ago. She is also having some mild wheezing and chest tightness that is resolved with albuterol. Used albuterol inhaler once this morning, but requests refill for this as she may run out soon. Her main concern today is getting the sinus pressure and cough under control.  She is scheduled to get a new orange card later this month.  History of asthma.  Has not required treatment for attack in many years, never required hospitalization as an adult.  Active smoker for >30 years  Review of Systems Denies chest pain, dyspnea, ear pain, emesis, myalgias.     Objective:   Physical Exam  Vitals reviewed. Constitutional: She is oriented to person, place, and time. She appears well-developed and well-nourished. No distress.  HENT:  Right Ear: External ear normal.  Left Ear: External ear normal.       No OP lesions, slight erythema present. + TTP in bilateral maxillary Sinus, not frontal. Purulent nasal rhinorrhea.  Eyes: EOM are normal.  Neck: Normal range of motion. Neck supple.  Cardiovascular: Normal rate, regular rhythm and normal heart sounds.   No murmur heard. Pulmonary/Chest: Effort normal. No respiratory distress. She has wheezes. She has no rales.       Bilateral exp wheezing throughout. The wheezing resolved nearly completely after duoneb is administered in office.  No focal lung findings.  Abdominal: Soft.  Lymphadenopathy:    She has no cervical adenopathy.  Neurological: She is alert and oriented to person, place, and time.  Skin: No rash noted. She is not diaphoretic.    Psychiatric: She has a normal mood and affect.          Assessment & Plan:

## 2012-12-23 NOTE — Assessment & Plan Note (Signed)
Seems motivated to quit and I encouraged her to continue progress (down from 1,5 ppd to 0.5 ppd) and f/u with PCP for options if desired.

## 2012-12-23 NOTE — Assessment & Plan Note (Signed)
Post-influenza. Empiric azithromycin in this PCN allergic patient.

## 2012-12-23 NOTE — Assessment & Plan Note (Signed)
Smoker with history of asthma and symptoms of sinusitis with bronchitis. Her wheezing resolves with duonebs x1. No hypoxia, distress, sign of pneumonia, or history of hospitalization for asthma since adulthood. Will treat empirically for sinusitis and bronchitis with azithromycin, prednisone 40 mg x 5 days, refill albuterol. Refill codeine-guaf cough syrup. Advised to f/u if not improving after few days or if wheezing worsens as she is at risk for developing pneumonia with her active smoking. Advised to seek emergency care for chest pain, dyspnea or other concerning symptoms.

## 2013-01-13 ENCOUNTER — Encounter (HOSPITAL_COMMUNITY): Payer: Self-pay | Admitting: *Deleted

## 2013-01-13 ENCOUNTER — Telehealth: Payer: Self-pay | Admitting: Sports Medicine

## 2013-01-13 ENCOUNTER — Emergency Department (HOSPITAL_COMMUNITY)
Admission: EM | Admit: 2013-01-13 | Discharge: 2013-01-13 | Disposition: A | Discharge status: Home / Self Care | Payer: Self-pay | Attending: Emergency Medicine | Admitting: Emergency Medicine

## 2013-01-13 ENCOUNTER — Ambulatory Visit: Payer: Self-pay | Admitting: Family Medicine

## 2013-01-13 DIAGNOSIS — F17200 Nicotine dependence, unspecified, uncomplicated: Secondary | ICD-10-CM | POA: Insufficient documentation

## 2013-01-13 DIAGNOSIS — Z8619 Personal history of other infectious and parasitic diseases: Secondary | ICD-10-CM | POA: Insufficient documentation

## 2013-01-13 DIAGNOSIS — Z8739 Personal history of other diseases of the musculoskeletal system and connective tissue: Secondary | ICD-10-CM | POA: Insufficient documentation

## 2013-01-13 DIAGNOSIS — I10 Essential (primary) hypertension: Secondary | ICD-10-CM | POA: Insufficient documentation

## 2013-01-13 DIAGNOSIS — Q759 Congenital malformation of skull and face bones, unspecified: Secondary | ICD-10-CM | POA: Insufficient documentation

## 2013-01-13 DIAGNOSIS — E7800 Pure hypercholesterolemia, unspecified: Secondary | ICD-10-CM | POA: Insufficient documentation

## 2013-01-13 DIAGNOSIS — L089 Local infection of the skin and subcutaneous tissue, unspecified: Secondary | ICD-10-CM | POA: Insufficient documentation

## 2013-01-13 DIAGNOSIS — Z8719 Personal history of other diseases of the digestive system: Secondary | ICD-10-CM | POA: Insufficient documentation

## 2013-01-13 DIAGNOSIS — Z79899 Other long term (current) drug therapy: Secondary | ICD-10-CM | POA: Insufficient documentation

## 2013-01-13 HISTORY — DX: Zoster without complications: B02.9

## 2013-01-13 MED ORDER — OXYCODONE-ACETAMINOPHEN 5-325 MG PO TABS: 2.0000 | ORAL_TABLET | ORAL | Status: DC | PRN

## 2013-01-13 MED ORDER — DOXYCYCLINE HYCLATE 100 MG PO CAPS: 100.0000 mg | ORAL_CAPSULE | Freq: Two times a day (BID) | ORAL | Status: DC

## 2013-01-13 MED ORDER — OXYCODONE-ACETAMINOPHEN 5-325 MG PO TABS
2.0000 | ORAL_TABLET | Freq: Once | ORAL | Status: AC
  Administered 2013-01-13: 2 via ORAL
  Filled 2013-01-13: qty 2

## 2013-01-13 NOTE — Telephone Encounter (Signed)
24 Hour emergency Call Line: Snow day 01/13/13  Pt called stating that she was evaluated in the ED and dx with shingles recently.  Was to follow up with Dr. [name] Sawyer today but clinic closed due to weather.  Pain is significant from shingles and only has 1 Norco left.  ED offered pain medication when dx but due to contract with Dr. [name] Sawyer pt did not accept Rx.    Pt has tramadol at home and will try this.  If continued pain will need to be re-evaluated in ED.  I told the pt it is okay to accept short term (#20) pain medicine if felt appropriate by EDP.  Will need follow up with Dr. [name] Sawyer ASAP.  Also notes she is having problems with her hip prothesis that she would like to discuss with Dr. [name] Sawyer.  Pt agreeable to plans and will follow up in ED if pain is not controlled by Tramadol.  Pt to call and schedule appointment ASAP.  [name], DO [facility] Resident - PGY-2 01/13/2013 9:19 AM

## 2013-01-13 NOTE — ED Provider Notes (Signed)
History     CSN: [id number]  Arrival date & time 01/13/13  1002   First MD Initiated Contact with Patient 01/13/13 1008      Chief Complaint  Patient presents with  . Herpes Zoster    (Consider location/radiation/quality/duration/timing/severity/associated sxs/prior treatment) HPI Comments: Patient is a 57 year old who presents with a 1 day history of right lateral upper thigh rash. The rash started gradually and progressively worsened since the onset. The rash is located on right lateral upper thigh. Patient has tried nothing without relief. Patient denies new exposures to medications, soaps, lotions, detergent. Patient reports associated occasional itching. No aggravating/alleviating factors. Patient denies fever, chills, NVD, sore throat, oral lesions, ocular involvement, throat closing, wheezing, SOB, chest pain, abdominal pain. Patient reports scalp pain also. She was seen 2 days ago at urgent care for the scalp pain where she was diagnosed with zoster.      Past Medical History  Diagnosis Date  . Hypertension   . Ruptured lumbar disc 1990    L4-L5  . Choledocholithiasis with acute cholecystitis     removal of surgery   . Craniosynostosis     congenital  . Hypercholesterolemia 05/28/2012    Has taken Crestor and Lipitor in the past. Switched to lovastatin b/c its $4.    . Shingles     Past Surgical History  Procedure Laterality Date  . Abdominal hysterectomy  1992    Total for chronic pelvic pain   . Cholecystectomy  2007    Performed in [place]   . 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 Back surgery  1998    L4-L5 fusion @ [facility]  . Appendectomy    . Craniectomy for craniosynostosis  1959    surgery x 3; congenital     No family history on file.  History  Substance Use Topics  . Smoking status: Current Every Day Smoker -- 0.50 packs/day for 30 years     Types: Cigarettes  . Smokeless tobacco: Not on file  . Alcohol Use: Yes     Comment: occasional    OB History   Grav Para Term Preterm Abortions TAB SAB Ect Mult Living                  Review of Systems  Skin: Positive for rash.  All other systems reviewed and are negative.    Allergies  Penicillins; Demerol; Gabapentin; Morphine and related; Sulfa antibiotics; Benadryl; and Cephalexin  Home Medications   Current Outpatient Rx  Name  Route  Sig  Dispense  Refill  . albuterol (PROVENTIL HFA;VENTOLIN HFA) 108 (90 BASE) MCG/ACT inhaler   Inhalation   Inhale 3 puffs into the lungs every 6 (six) hours as needed for wheezing.         Marland Kitchen ALPRAZolam (XANAX) 1 MG tablet   Oral   Take 1 mg by mouth daily.         . cimetidine (TAGAMET) 800 MG tablet   Oral   Take 800 mg by mouth 3 (three) times daily as needed. For acid reflux         . clonazePAM (KLONOPIN) 0.5 MG tablet   Oral   Take 1 tablet (0.5 mg total) by mouth 2 (two) times daily as needed.   60 tablet   2   . diclofenac (VOLTAREN) 75 MG EC tablet   Oral  Take 75 mg by mouth 2 (two) times daily as needed.         . Guaifenesin-Codeine 200-10 MG/5ML LIQD   Oral   Take 5-10 mLs by mouth every 8 (eight) hours as needed.   200 mL   0   . HYDROcodone-acetaminophen (NORCO) 10-325 MG per tablet   Oral   Take 1 tablet by mouth every 8 (eight) hours as needed.   60 tablet   2   . lovastatin (MEVACOR) 20 MG tablet   Oral   Take 1 tablet (20 mg total) by mouth at bedtime.   30 tablet   3   . Multiple Vitamin (MULITIVITAMIN WITH MINERALS) TABS   Oral   Take 1 tablet by mouth daily.         Marland Kitchen omeprazole (PRILOSEC) 40 MG capsule   Oral   Take 40 mg by mouth 2 (two) times daily.         . propranolol (INDERAL) 20 MG tablet   Oral   Take 20 mg by mouth 2 (two) times daily.         Marland Kitchen triamterene-hydrochlorothiazide (MAXZIDE-25) 37.5-25 MG per tablet   Oral   Take 1 tablet by mouth  daily.         Marland Kitchen VITAMIN D, ERGOCALCIFEROL, PO   Oral   Take 1 tablet by mouth daily.         Marland Kitchen zolpidem (AMBIEN) 10 MG tablet   Oral   Take 10 mg by mouth at bedtime as needed.         . butalbital-acetaminophen-caffeine (FIORICET, ESGIC) 50-325-40 MG per tablet   Oral   Take 1 tablet by mouth daily as needed. For migraines         . conjugated estrogens (PREMARIN) vaginal cream   Vaginal   Place vaginally 2 (two) times a week.   42.5 g   12   . lidocaine (LIDODERM) 5 %   Transdermal   Place 1 patch onto the skin daily. Remove & Discard patch within 12 hours or as directed by MD           BP 144/73  Pulse 102  Temp(Src) 98.5 F (36.9 C) (Oral)  SpO2 100%  Physical Exam  Nursing note and vitals reviewed. Constitutional: She is oriented to person, place, and time. She appears well-developed and well-nourished. No distress.  HENT:  Head: Normocephalic and atraumatic.  Eyes: Conjunctivae and EOM are normal.  Neck: Normal range of motion.  Cardiovascular: Normal rate and regular rhythm.  Exam reveals no gallop and no friction rub.   No murmur heard. Pulmonary/Chest: Effort normal and breath sounds normal. She has no wheezes. She has no rales. She exhibits no tenderness.  Abdominal: Soft. There is no tenderness.  Musculoskeletal: Normal range of motion.  Neurological: She is alert and oriented to person, place, and time. Coordination normal.  Speech is goal-oriented. Moves limbs without ataxia.   Skin: Skin is warm and dry.  No rash or ulcerations noted on patient's scalp. Area of erythema and central ulceration on skin overlying patient's right lateral hip. Area of right hip is tender to palpation.   Psychiatric: She has a normal mood and affect. Her behavior is normal.    ED Course  Procedures (including critical care time)  Labs Reviewed - No data to display No results found.   1. Skin infection       MDM  10:57 AM I see no signs of zoster on  the patient's scalp where she complains of pain. Patient likely has cellulitis of right hip. I will treat her with Bactrim and Percocet for pain. Patient instructed to follow up with PCP for further evaluation and management. Vitals stable for discharge. Patient afebrile.         Emilia Hoaglin, PA-C 01/13/13 1359

## 2013-01-13 NOTE — ED Notes (Signed)
Pt was diagnosed with shingles 2 days ago and is here for evaluation of severe pain.  Pt has pain on her scalp ("like bees")

## 2013-01-14 ENCOUNTER — Telehealth: Payer: Self-pay | Admitting: Sports Medicine

## 2013-01-14 MED ORDER — CLINDAMYCIN HCL 300 MG PO CAPS: 300.0000 mg | ORAL_CAPSULE | Freq: Three times a day (TID) | ORAL | Status: DC

## 2013-01-14 NOTE — Telephone Encounter (Signed)
24 Hour Emergency Call Line (snow day)  Pt called after being seen in the ED yesterday for her pain from Shingles and Left hip cellulitis.  Given doxycycline but not tolerating.  Vomiting ~1 hour after each episode.   Requesting phenergan.  No appropriate given extensive polysubstance medication list and pt unknown to me.  (Told her policy was to not write different classes of meds; addition of anti-nausea medication vs changing ABX)  Will change her antibiotic to Clindamycin given extensive allergies and coverage for cellulitis.  Pt reports hx of diarrhea with Clindamycin and I encouraged her to increase her pro-biotic usage and to increase natural forms of probiotics.  To follow up next week with PCP.  [name], DO [facility] Resident - PGY-2 01/14/2013 3:37 PM

## 2013-01-15 NOTE — ED Provider Notes (Signed)
Medical screening examination/treatment/procedure(s) were conducted as a shared visit with non-physician practitioner(s) and myself.  I personally evaluated the patient during the encounter  Pt well appearing, no distress.  Stable for d/c  Joya Gaskins, MD 01/15/13 [phone]

## 2013-01-16 ENCOUNTER — Telehealth: Payer: Self-pay | Admitting: Sports Medicine

## 2013-01-16 NOTE — Telephone Encounter (Signed)
24 Hour Emergency Line:  Please see other phone notes: Pt called because wound is "worsening".  Reports some increased redness.  Minimal pain. Tolerating PO.  Taking ABX.    Gave red flags to be seen in ED including worsening fevers, uncontrolled pain, Vomiting, inability to tolerate PO.  Can use heat and ibuprofen.  Told to call when clinic opens to schedule f/u appointment.  [name], DO [facility] Resident - PGY-2 01/16/2013 12:33 PM

## 2013-01-17 ENCOUNTER — Encounter: Payer: Self-pay | Admitting: Family Medicine

## 2013-01-17 ENCOUNTER — Ambulatory Visit (INDEPENDENT_AMBULATORY_CARE_PROVIDER_SITE_OTHER): Payer: Self-pay | Admitting: Family Medicine

## 2013-01-17 VITALS — BP 144/88 | HR 101 | Temp 97.9°F | Ht 61.0 in | Wt 140.0 lb

## 2013-01-17 DIAGNOSIS — L0291 Cutaneous abscess, unspecified: Secondary | ICD-10-CM

## 2013-01-17 DIAGNOSIS — L0390 Cellulitis, unspecified: Secondary | ICD-10-CM | POA: Insufficient documentation

## 2013-01-17 MED ORDER — PROMETHAZINE HCL 12.5 MG PO TABS: 12.5000 mg | ORAL_TABLET | Freq: Four times a day (QID) | ORAL | Status: DC | PRN

## 2013-01-17 NOTE — Assessment & Plan Note (Signed)
Cellulitis improving based on patient's photos from several days ago.  Wound is closed and healing.  Cellulitis is mild.  Continue Clindamycin 300 TID (Day 4 or 7).  Will give Phenergan PRN for nausea prior to taking Clindamycin.  No signs of systemic infection today.  Red flags reviewed with patient.  Patient to follow up in 3-4 days to recheck wound.

## 2013-01-17 NOTE — Progress Notes (Signed)
  Subjective:    Patient ID: Diana [name], female    DOB: [birth year], 57 y.o.   MRN: [id number]  HPI  Patient presents to clinic for wound on RT hip.  Onset: 5 days ago.  Patient woke up and noticed a "boil" on RT hip over an old scar from a hip replacement.  She went to ED on Thursday for wound and given Rx for doxycycline.  She developed dry heaves and vomiting and stopped taking Doxy.  She was then started on Clindamycin over the weekend.  Patient thinks wound is getting worse.  Wound is very painful and started itching 2 days ago, but itching went away.  She has taken Percocet and Norco for pain which makes pain tolerable.  She has seen pus in wound, but no rupture of drainage.  No bleeding.  Denies any fever, chills, NS, or vomiting at this time.  Does endorse nausea.  Review of Systems Per HPI    Objective:   Physical Exam  Constitutional: She appears well-nourished. No distress.  Skin:  Rt hip: 1 cm round wound, with central opening that is scabbed and healing; mild erythema surrounding scab that is tender on palpation; no abscess or drainage at this time; old scar from previous total hip replacement      Assessment & Plan:

## 2013-01-25 ENCOUNTER — Encounter: Payer: Self-pay | Admitting: Family Medicine

## 2013-01-25 ENCOUNTER — Ambulatory Visit (INDEPENDENT_AMBULATORY_CARE_PROVIDER_SITE_OTHER): Payer: Self-pay | Admitting: Family Medicine

## 2013-01-25 VITALS — BP 126/85 | HR 82 | Ht 61.0 in | Wt 138.0 lb

## 2013-01-25 DIAGNOSIS — E785 Hyperlipidemia, unspecified: Secondary | ICD-10-CM

## 2013-01-25 DIAGNOSIS — M549 Dorsalgia, unspecified: Secondary | ICD-10-CM

## 2013-01-25 DIAGNOSIS — G8929 Other chronic pain: Secondary | ICD-10-CM

## 2013-01-25 MED ORDER — PROPRANOLOL HCL 20 MG PO TABS: 20.0000 mg | ORAL_TABLET | Freq: Two times a day (BID) | ORAL | Status: DC

## 2013-01-25 MED ORDER — ZOLPIDEM TARTRATE 10 MG PO TABS: 10.0000 mg | ORAL_TABLET | Freq: Every evening | ORAL | Status: DC | PRN

## 2013-01-25 MED ORDER — HYDROCODONE-ACETAMINOPHEN 10-325 MG PO TABS: 1.0000 | ORAL_TABLET | Freq: Three times a day (TID) | ORAL | Status: DC | PRN

## 2013-01-25 MED ORDER — BUTALBITAL-APAP-CAFFEINE 50-325-40 MG PO TABS: 1.0000 | ORAL_TABLET | Freq: Every day | ORAL | Status: DC | PRN

## 2013-01-25 MED ORDER — OMEPRAZOLE 40 MG PO CPDR: 40.0000 mg | DELAYED_RELEASE_CAPSULE | Freq: Two times a day (BID) | ORAL | Status: DC

## 2013-01-25 MED ORDER — CIMETIDINE 800 MG PO TABS: 800.0000 mg | ORAL_TABLET | Freq: Three times a day (TID) | ORAL | Status: DC | PRN

## 2013-01-25 NOTE — Patient Instructions (Addendum)
Please follow up at your convenience for a cholesterol panel and then with me a week later.   I am glad that you pain is improving.   See you soon,   Dr. [name] Sawyer

## 2013-01-25 NOTE — Progress Notes (Signed)
  Subjective:    Patient ID: Diana [name], female    DOB: 01/09/1956, 57 y.o.   MRN: [id number]  HPI  1. Cellulitis Right Hip - Improving after Clindamycin 300 md TID x 7 days; denies fever, still with tenderness, but swelling has decreased   2. Hypertension  Home BP monitoring:  BP Readings from Last 3 Encounters:  01/25/13 126/85  01/17/13 144/88  01/13/13 144/73    Prescribed meds: triamterene-HCTZ 37.5-25  Hypertension ROS: taking medications as instructed, no medication side effects noted, no TIA's, no chest pain on exertion, no dyspnea on exertion and no swelling of ankles    Review of Systems Positive: Nausea     Objective:   Physical Exam BP 126/85  Pulse 82  Ht 5\' 1"  (1.549 m)  Wt 138 lb (62.596 kg)  BMI 26.09 kg/m2  Gen: well appearing, middle aged WF, pleasant and conversant HEENT: NCAT, PERRLA CV: RRR, no m,r,g; no carotid bruits Pulm: CTA-B Skin over right hip: well healed midline incision with 1cm scab, no surrounding erythema or mass; tender Ankles: no edema      Assessment & Plan:  57 year old with stable HTN and resolved cellulitis.

## 2013-02-04 ENCOUNTER — Other Ambulatory Visit: Payer: Self-pay

## 2013-02-08 ENCOUNTER — Other Ambulatory Visit: Payer: No Typology Code available for payment source

## 2013-02-08 DIAGNOSIS — E785 Hyperlipidemia, unspecified: Secondary | ICD-10-CM

## 2013-02-08 NOTE — Progress Notes (Signed)
FLP DONE TODAY Diana [name] 

## 2013-02-09 LAB — LIPID PANEL
Cholesterol: 281 mg/dL — ABNORMAL HIGH (ref 0–200)
Total CHOL/HDL Ratio: 5.1 Ratio

## 2013-02-15 ENCOUNTER — Other Ambulatory Visit: Payer: Self-pay | Admitting: Family Medicine

## 2013-02-16 ENCOUNTER — Encounter: Payer: Self-pay | Admitting: Family Medicine

## 2013-02-16 ENCOUNTER — Ambulatory Visit (INDEPENDENT_AMBULATORY_CARE_PROVIDER_SITE_OTHER): Payer: No Typology Code available for payment source | Admitting: Family Medicine

## 2013-02-16 VITALS — BP 137/87 | HR 91 | Ht 61.0 in | Wt 143.0 lb

## 2013-02-16 DIAGNOSIS — R300 Dysuria: Secondary | ICD-10-CM

## 2013-02-16 DIAGNOSIS — E7800 Pure hypercholesterolemia, unspecified: Secondary | ICD-10-CM

## 2013-02-16 DIAGNOSIS — G2581 Restless legs syndrome: Secondary | ICD-10-CM

## 2013-02-16 DIAGNOSIS — E785 Hyperlipidemia, unspecified: Secondary | ICD-10-CM

## 2013-02-16 LAB — POCT URINALYSIS DIPSTICK
Bilirubin, UA: NEGATIVE
Ketones, UA: NEGATIVE
Leukocytes, UA: NEGATIVE

## 2013-02-16 MED ORDER — HYDROXYZINE PAMOATE 25 MG PO CAPS: 25.0000 mg | ORAL_CAPSULE | Freq: Every evening | ORAL | Status: DC | PRN

## 2013-02-16 MED ORDER — SIMVASTATIN 40 MG PO TABS: 40.0000 mg | ORAL_TABLET | Freq: Every day | ORAL | Status: DC

## 2013-02-16 NOTE — Patient Instructions (Addendum)
Dear Ms. Spenser,   Please read below regarding the issues that we discussed.   1. Urinary Frequency - I will call you this afternoon when we have he results back from the urine test.   2. Restless Leg - You can get 60 vistaril for 3.99 from [facility].   3. High Cholesterol - As I showed you, your risk for heart disease is 9.9% over 10 years. So we need to start a cholesterol medication called Simvastatin 40 mg, which is 4.99 at [facility]. Also, stopping smoking will decrease your risk significantly. Please let me know if I can help.   In the future, we need to get a colonoscopy and mammogram and also a Tetanus vaccine.   Please follow up in 1 month.   Take Care,   Dr. [name] Sawyer

## 2013-02-16 NOTE — Progress Notes (Signed)
  Subjective:    Patient ID: Diana [name], female    DOB: October 24, 1956, 57 y.o.   MRN: [id number]  HPI  57 year old F with chronic back pain, tobacco abuse, and HLD who presents for follow up.   1. Rt hip cellulits  - Started on month ago, took clindamycin x 7 days and continued to improve, still with scab but not red or swollen, not putting on any cream  2. Hyperlipidemia - Patient with recent Lipid screen, previously on Lovastatin 20 mg, denies chest pain, SOB, leg claudication; no hx of stroke  3. Urinary frequency/Right sided flank pain - 1.5 weeks duration, pelvic pain and dysuria, no discharge, right sided flank pain present, denies nausea, vomiting, fever, chills   Review of Systems Restless leg syndrome, needs refill of vistaril    Objective:   Physical Exam  BP 137/87  Pulse 91  Ht 5\' 1"  (1.549 m)  Wt 143 lb (64.864 kg)  BMI 27.03 kg/m2 Gen: middle age white female, well appearing, NAD, pleasant and conversant CV: RRR, no m/r/g, no JVD or carotid bruits Pulm: normal WOB, CTA-B Abd: lower abdominal pain with palpation;  Skin: warm, dry, no rashes  Lipid Panel     Component Value Date/Time   CHOL 281* 02/08/2013 0852   TRIG 144 02/08/2013 0852   HDL 55 02/08/2013 0852   CHOLHDL 5.1 02/08/2013 0852   VLDL 29 02/08/2013 0852   LDLCALC 197* 02/08/2013 0852    10 year CVD risk 9.9%     Assessment & Plan:  57 year old F with resolved cellulitis over the right hip, restless leg syndrome, hyperlipidemia, and possible UTI.

## 2013-02-17 NOTE — Assessment & Plan Note (Signed)
Patient with 10 year CVD risk of 9.9%, so needs treatment with moderate intensity stating. Therefore, start Simvastatin 40 mg daily. F/u in 1 year.

## 2013-02-17 NOTE — Assessment & Plan Note (Signed)
Refill vistaril 25 mg PRN.

## 2013-02-24 ENCOUNTER — Other Ambulatory Visit: Payer: Self-pay | Admitting: *Deleted

## 2013-02-25 MED ORDER — DICLOFENAC SODIUM 75 MG PO TBEC: 75.0000 mg | DELAYED_RELEASE_TABLET | Freq: Two times a day (BID) | ORAL | Status: DC | PRN

## 2013-03-21 ENCOUNTER — Encounter: Payer: Self-pay | Admitting: Family Medicine

## 2013-03-21 ENCOUNTER — Ambulatory Visit (INDEPENDENT_AMBULATORY_CARE_PROVIDER_SITE_OTHER): Payer: Self-pay | Admitting: Family Medicine

## 2013-03-21 VITALS — BP 132/67 | HR 89 | Temp 98.8°F | Resp 17 | Ht 61.0 in | Wt 141.5 lb

## 2013-03-21 DIAGNOSIS — K589 Irritable bowel syndrome without diarrhea: Secondary | ICD-10-CM

## 2013-03-21 DIAGNOSIS — Z1211 Encounter for screening for malignant neoplasm of colon: Secondary | ICD-10-CM

## 2013-03-21 DIAGNOSIS — Z23 Encounter for immunization: Secondary | ICD-10-CM

## 2013-03-21 NOTE — Patient Instructions (Addendum)
Please increase the use of stool softeners to at least twice a day.   Also, use the stool cards in order to make sure that there is no blood in the stool.   Please return in 2-3 months.   Dr. [name] Sawyer

## 2013-03-21 NOTE — Progress Notes (Signed)
  Subjective:    Patient ID: Diana [name], female    DOB: 11-17-1956, 57 y.o.   MRN: [id number]  HPI  57 year old F with chronic pain and HLD who presents for follow up and health maintenance visit.   Colon Cancer Screening - No history of colonoscopy, no family hx of colon or rectal cancer, denies rectal bleeding or dark stools   Breast Cancer Screening - Through health department of [facility] last year, has it done annually, no history of abnormal finding, denies current probs   History of Irritable Bowel Syndrome - diagnosed at age 49, constipation type, uses generic stool softener, worsening constipation over last year characterized by firm balls ; using miralax only once daily and wants to know what to do to improve stools   Review of Systems     Objective:   Physical Exam  BP 132/67  Pulse 89  Temp(Src) 98.8 F (37.1 C) (Oral)  Resp 17  Ht 5\' 1"  (1.549 m)  Wt 141 lb 8 oz (64.184 kg)  BMI 26.75 kg/m2  SpO2 100%  Gen: middle aged WF, non ill appearing, pleasant and conversant  Abd: soft, non distended, non tender, hypoactive bowel sounds      Assessment & Plan:

## 2013-03-27 DIAGNOSIS — Z1211 Encounter for screening for malignant neoplasm of colon: Secondary | ICD-10-CM | POA: Insufficient documentation

## 2013-03-27 NOTE — Assessment & Plan Note (Signed)
Patient without health insurance so it will be difficult to obtain colonoscopy. Given stool cards for collection.

## 2013-03-27 NOTE — Assessment & Plan Note (Signed)
Encouraged to use stool softener, two doses BID. If not effective add senna. If that is not effective, then suppository. Patient voices understanding.

## 2013-05-23 ENCOUNTER — Encounter: Payer: Self-pay | Admitting: Family Medicine

## 2013-05-23 ENCOUNTER — Ambulatory Visit (INDEPENDENT_AMBULATORY_CARE_PROVIDER_SITE_OTHER): Payer: Self-pay | Admitting: Family Medicine

## 2013-05-23 VITALS — BP 137/80 | HR 72 | Temp 98.3°F | Wt 141.0 lb

## 2013-05-23 DIAGNOSIS — E876 Hypokalemia: Secondary | ICD-10-CM

## 2013-05-23 DIAGNOSIS — G2581 Restless legs syndrome: Secondary | ICD-10-CM

## 2013-05-23 DIAGNOSIS — M549 Dorsalgia, unspecified: Secondary | ICD-10-CM

## 2013-05-23 DIAGNOSIS — G8929 Other chronic pain: Secondary | ICD-10-CM

## 2013-05-23 LAB — BASIC METABOLIC PANEL
CO2: 28 mEq/L (ref 19–32)
Calcium: 9.9 mg/dL (ref 8.4–10.5)
Chloride: 101 mEq/L (ref 96–112)
Sodium: 136 mEq/L (ref 135–145)

## 2013-05-23 MED ORDER — CLONAZEPAM 0.5 MG PO TABS: 0.5000 mg | ORAL_TABLET | Freq: Every evening | ORAL | Status: DC | PRN

## 2013-05-23 MED ORDER — HYDROCODONE-ACETAMINOPHEN 10-325 MG PO TABS: 1.0000 | ORAL_TABLET | Freq: Three times a day (TID) | ORAL | Status: DC | PRN

## 2013-05-23 NOTE — Patient Instructions (Signed)
We will check your potassium today and then readdress your use of the potassium pills. I will call you about that.   Please continue with your pain medication regimen and let me know if you have any falls. I will let you know when the forms are completed.   Sincerely,   Dr. [name] Sawyer

## 2013-05-23 NOTE — Progress Notes (Signed)
Subjective:    Patient ID: Diana [name], female    DOB: 1956-08-01, 57 y.o.   MRN: [id number]  HPI    Pain Follow Up  Location: central lower back and right hip Symptoms: pressure - patient concerned about flares   Source: lumbar back s/p fusion and right hip arthroplasty Current Treatment/Recent Course: Voltaren 75 mg q 2-3 days, Norco 10-325 1-2 pills per day during flare (last fill 01/10/13 # 60 with 3 refills), lidoderm 0.5 patches q 2 days, also gets most relief from lying down, getting into hot tub, TENS unit Character 8/10 Analgesia 6/10 - lidoderm patch every other day 1/2 patch  Activity Increased: No - specifically  Aberant Behavior: No Adverse Reaction: No Recent Imaging: No - 7/13 stable lumbar spine and right hip arthroplast Patient's Feelings About Condition: would like non-medicine method for reducing severe pain, does not want to try SNRI or Lyrica, Also couldn't tolerate Gabapentin in past   Restless Leg Syndrome: - Occurs nightly - Controlled with vistaril 25 mg PRN and clonazepam 0.5 mg PRN - Has not been well controlled on vistaril so would like refill on Clonazepam, last refilled in Sept 2013 - No adverse effects to meds   Past Surgical History  Procedure Laterality Date  . Abdominal hysterectomy  1992    Total for chronic pelvic pain   . Cholecystectomy  2007    Performed in [place]   . 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 Back surgery  1998    L4-L5 fusion @ [facility]  . Appendectomy    . 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 albuterol (PROVENTIL HFA;VENTOLIN HFA) 108 (90 BASE) MCG/ACT inhaler Inhale 3 puffs into the lungs every 6 (six) hours as needed for wheezing.      Marland Kitchen ALPRAZolam (XANAX) 1 MG tablet Take 1 mg by mouth  daily.      . butalbital-acetaminophen-caffeine (FIORICET, ESGIC) 50-325-40 MG per tablet Take 1 tablet by mouth daily as needed. For migraines  30 tablet  1  . cimetidine (TAGAMET) 800 MG tablet Take 1 tablet (800 mg total) by mouth 3 (three) times daily as needed. For acid reflux  90 tablet  11  . conjugated estrogens (PREMARIN) vaginal cream Place vaginally 2 (two) times a week.  42.5 g  12  . diclofenac (VOLTAREN) 75 MG EC tablet Take 1 tablet (75 mg total) by mouth 2 (two) times daily as needed.  60 tablet  2  . Guaifenesin-Codeine 200-10 MG/5ML LIQD Take 5-10 mLs by mouth every 8 (eight) hours as needed.  200 mL  0  . hydrOXYzine (VISTARIL) 25 MG capsule Take 1 capsule (25 mg total) by mouth at bedtime as needed for itching.  60 capsule  2  . lidocaine (LIDODERM) 5 % Place 1 patch onto the skin daily. Remove & Discard patch within 12 hours or as directed by MD      . Multiple Vitamin (MULITIVITAMIN WITH MINERALS) TABS Take 1 tablet by mouth daily.      Marland Kitchen omeprazole (PRILOSEC) 40 MG capsule Take 1 capsule (40 mg total) by mouth 2 (two) times daily.  60 capsule  11  . promethazine (PHENERGAN) 12.5 MG tablet Take 1 tablet (12.5 mg total) by mouth every 6 (  six) hours as needed for nausea.  30 tablet  0  . propranolol (INDERAL) 20 MG tablet Take 1 tablet (20 mg total) by mouth 2 (two) times daily.  60 tablet  11  . simvastatin (ZOCOR) 40 MG tablet Take 1 tablet (40 mg total) by mouth at bedtime.  30 tablet  11  . triamterene-hydrochlorothiazide (MAXZIDE-25) 37.5-25 MG per tablet Take 1 tablet by mouth daily.      Marland Kitchen VITAMIN D, ERGOCALCIFEROL, PO Take 1 tablet by mouth daily.      Marland Kitchen zolpidem (AMBIEN) 10 MG tablet Take 1 tablet (10 mg total) by mouth at bedtime as needed.  30 tablet  5   No current facility-administered medications on file prior to visit.     Review of Systems     Objective:   Physical Exam  Musculoskeletal:       Right hip: She exhibits decreased range of motion. She  exhibits normal strength and no tenderness.       Left hip: She exhibits normal range of motion, normal strength and no tenderness.       Right knee: Normal.       Left knee: Normal.  Neurological: She has normal strength.  Reflex Scores:      Patellar reflexes are 2+ on the right side and 2+ on the left side.      Achilles reflexes are 2+ on the right side and 2+ on the left side.  BP 137/80  Pulse 72  Temp(Src) 98.3 F (36.8 C) (Oral)  Wt 141 lb (63.957 kg)  BMI 26.66 kg/m2      Assessment & Plan:

## 2013-05-24 NOTE — Assessment & Plan Note (Signed)
No new intervention suggested as problem stable.  - Continue TENS unit, hot tub PRN - Cont voltaren daily as tolerated, Norco 10-325 PRN (given Rx for # 60 with 2 refills), lidoderm patches PRN

## 2013-05-24 NOTE — Assessment & Plan Note (Addendum)
Given Rx for clonazepam 0.5 mg PO QHS PRN, #30 with 2 refills

## 2013-06-01 ENCOUNTER — Telehealth: Payer: Self-pay | Admitting: Family Medicine

## 2013-06-01 NOTE — Telephone Encounter (Signed)
LMOM advising pt of Dr Yetta Numbers recommedations

## 2013-06-01 NOTE — Telephone Encounter (Signed)
Please call the patient and let her know that her potassium was normal at the last visit. Therefore, I do not think this is playing a role in her restless leg syndrome. I would like her to stop her potassium pills for right now and then recheck her potassium level in one month. Thank you.

## 2013-06-09 ENCOUNTER — Encounter: Payer: Self-pay | Admitting: Family Medicine

## 2013-06-09 ENCOUNTER — Ambulatory Visit (INDEPENDENT_AMBULATORY_CARE_PROVIDER_SITE_OTHER): Payer: Self-pay | Admitting: Family Medicine

## 2013-06-09 VITALS — BP 140/88 | HR 99 | Temp 98.4°F | Ht 61.0 in | Wt 140.5 lb

## 2013-06-09 DIAGNOSIS — J329 Chronic sinusitis, unspecified: Secondary | ICD-10-CM

## 2013-06-09 MED ORDER — OXYMETAZOLINE HCL 0.05 % NA SOLN: 2.0000 | Freq: Two times a day (BID) | NASAL | Status: DC

## 2013-06-09 MED ORDER — AZITHROMYCIN 250 MG PO TABS: ORAL_TABLET | ORAL | Status: DC

## 2013-06-09 NOTE — Patient Instructions (Addendum)
It was nice to meet you today!  I am sorry you are not feeling well. Your sinus infection is most likely related to a virus. I recommend you try afrin nasal spray for the next 3 days. If you aren't feeling better in four days, start the azithromycin. If you start feeling a lot worse, return to clinic to be seen again.  Be well, Dr. Pollie Meyer

## 2013-06-09 NOTE — Progress Notes (Signed)
Patient ID: Diana [name], female   DOB: 04/30/1956, 57 y.o.   MRN: [id number]   HPI:  Sinusitis: Pt presents for a same day appointment to discuss possible sinus infection. She has had symptoms for the last 3 days. She has had a lot of nasal congestion and pain underneath her cheekbones and forehead. Has been coughing a lot and has vomited from the cough. Has sore glands. Denies chest pain or severe shortness of breath (just SOB from coughing). Isn't producing much sputum but when she does it it is green. She is a current smoker. Denies teeth pain. Has used cepachol and generic sinus medicine, as well as a neti pot and tea tree oil. Has been getting "chunks" out with irrigation. Also tried sitting in a hot tub to let the steam clear her sinuses. Uses albuterol prn. She feels like she is overall getting worse. Has had fever of 100-101 at home.   ROS: See HPI  PMFSH: current smoker  PHYSICAL EXAM: BP 140/88  Pulse 99  Temp(Src) 98.4 F (36.9 C) (Oral)  Ht 5\' 1"  (1.549 m)  Wt 140 lb 8 oz (63.73 kg)  BMI 26.56 kg/m2 Gen: NAD HEENT: TM's with fluid behind bilateral drums, no erythema or bulging. Nasal turbinates red with some clear mucous present, oropharynx clear. Some anterior LAD present. Maxillary and frontal sinuses tender to palpation Heart: RRR Lungs: CTAB. Normal respiratory effort. Frequent cough. Neuro: grossly nonfocal, speech intact

## 2013-06-13 DIAGNOSIS — J329 Chronic sinusitis, unspecified: Secondary | ICD-10-CM | POA: Insufficient documentation

## 2013-06-13 NOTE — Assessment & Plan Note (Addendum)
Most likely viral in etiology given that pt has only had sx for 3 days. Afebrile here in clinic, and NAD. Ambulated with pulse ox and O2 sat was 99% on room air. Will rx afrin nasal spray to be used for no more than 3 days. I do not think pt's presentation warrants antibiotic therapy at this time but will give her a printed rx which she can fill in the event that her symptoms continue to last beyond a week without improvement. Pt has multiple rx allergies but can take azithromycin, so will rx z pack for use if no improvement in 4 days.

## 2013-07-25 ENCOUNTER — Encounter: Payer: Self-pay | Admitting: Family Medicine

## 2013-07-25 ENCOUNTER — Ambulatory Visit (HOSPITAL_COMMUNITY)
Admission: RE | Admit: 2013-07-25 | Discharge: 2013-07-25 | Disposition: A | Discharge status: Home / Self Care | Payer: Medicaid Other | Source: Ambulatory Visit | Attending: Family Medicine | Admitting: Family Medicine

## 2013-07-25 ENCOUNTER — Ambulatory Visit (INDEPENDENT_AMBULATORY_CARE_PROVIDER_SITE_OTHER): Payer: Medicaid Other | Admitting: Family Medicine

## 2013-07-25 ENCOUNTER — Telehealth: Payer: Self-pay | Admitting: Family Medicine

## 2013-07-25 VITALS — BP 153/89 | HR 99 | Ht 61.0 in | Wt 139.0 lb

## 2013-07-25 DIAGNOSIS — M25579 Pain in unspecified ankle and joints of unspecified foot: Secondary | ICD-10-CM

## 2013-07-25 DIAGNOSIS — M25476 Effusion, unspecified foot: Secondary | ICD-10-CM | POA: Insufficient documentation

## 2013-07-25 DIAGNOSIS — M25473 Effusion, unspecified ankle: Secondary | ICD-10-CM | POA: Insufficient documentation

## 2013-07-25 DIAGNOSIS — M25571 Pain in right ankle and joints of right foot: Secondary | ICD-10-CM

## 2013-07-25 NOTE — Telephone Encounter (Signed)
I called the patient to inform her that she does not have a fracture or dislocation of her ankle. She should continue with the plan we discussed at her visit and follow up in 7-10 days. She voiced understanding.

## 2013-07-25 NOTE — Patient Instructions (Addendum)
Dear Ms. Kight,   I am so sorry that you have had such an injury to your ankle. The first thing that we need to do is rule out a fracture. Please [facility] to have [facility] X-ray this morning. Ice it regularly and take anti-inflammatories consistently. Wear the brace while walking and try to minimize the amount that your walk today. Also, when the pain is improved, start to move your ankle in circles to maintain the range of motion.  I will call you with the results of the X-ray and discuss when you need to return.   Also, I will complete the forms and mail them to the lawyer.   Sincerely,   Dr. [name] Sawyer

## 2013-07-25 NOTE — Progress Notes (Signed)
  Subjective:    Patient ID: Diana [name], female    DOB: 10/27/1956, 57 y.o.   MRN: [id number]  HPI  57 year old F who presents for evaluation of right ankle pain. She also needs is completed for her disability claim.  Ankle pain:  Laterality: right sided Character: 9/10 most painful posterior lateral malleoulus Onset: fell while walking in her home, rolled ankle laterally and became swollen and "blue" immediately; she fell because she was having severe rt-sided groin pain which is typical for her Alleviating: none - tried Voltaren and Norco, which she takes for back pain Exacerbating: walking History: no hx of injury or surgeries the patient can remember  Disability paperwork: The patient has correspondence from a law firm in [place] but she is requesting for me to complete and fax back to them. Specifically address her disability and pain in her low back and right hip.  Review of Systems Positive for back pain and right hip pain which is chronic    Objective:   Physical Exam BP 153/89  Pulse 99  Ht 5\' 1"  (1.549 m)  Wt 139 lb (63.05 kg)  BMI 26.28 kg/m2  General: Middle-aged female, very uncomfortable Right ankle: Patient has it wrapped in Ace bandage  - No obvious bony deformity  - Swelling of the lateral midfoot and lateral malleolus  - Exquisite tenderness to palpation posterior-superior lateral malleolus, midfoot, and with ankle squeeze  - Negative Thompson test  - Decreased active plantar flexion and dorsiflexion  - Using a cane to walk and cannot walk without assistance Left ankle: No obvious bony deformity, no swelling or tenderness and normal range of motion       Assessment & Plan:  Greater than 15 minutes spent completing disability forms

## 2013-07-26 DIAGNOSIS — M25571 Pain in right ankle and joints of right foot: Secondary | ICD-10-CM | POA: Insufficient documentation

## 2013-07-26 NOTE — Assessment & Plan Note (Signed)
Assessment: Patient with acute injury of the right ankle due to inversion consistent with sprain of ATFL and PTFL. She could also have a syndesmosis injury based on a positive squeeze test. Additionally, the patient's exam is positive for severe tenderness over the lateral posterior malleolus and midfoot, therefore according to the [place] ankle rules a plain film of her leg will be ordered. Plan: - Immobilize with Aircast in office - Encouraged rest, ice, elevation and immobilization today - For pain control and, the patient will use Voltaren and Norco which she has at home - if the patient has a fracture, and referred to orthopedic surgery, if not return to clinic in one week

## 2013-08-05 ENCOUNTER — Ambulatory Visit: Payer: No Typology Code available for payment source

## 2013-08-10 ENCOUNTER — Ambulatory Visit (INDEPENDENT_AMBULATORY_CARE_PROVIDER_SITE_OTHER): Payer: No Typology Code available for payment source | Admitting: Family Medicine

## 2013-08-10 ENCOUNTER — Encounter: Payer: Self-pay | Admitting: Family Medicine

## 2013-08-10 ENCOUNTER — Telehealth: Payer: Self-pay | Admitting: Family Medicine

## 2013-08-10 VITALS — BP 170/94 | HR 96 | Ht 61.0 in | Wt 141.0 lb

## 2013-08-10 DIAGNOSIS — M25579 Pain in unspecified ankle and joints of unspecified foot: Secondary | ICD-10-CM

## 2013-08-10 DIAGNOSIS — M25571 Pain in right ankle and joints of right foot: Secondary | ICD-10-CM

## 2013-08-10 DIAGNOSIS — M549 Dorsalgia, unspecified: Secondary | ICD-10-CM

## 2013-08-10 DIAGNOSIS — I10 Essential (primary) hypertension: Secondary | ICD-10-CM

## 2013-08-10 DIAGNOSIS — IMO0002 Reserved for concepts with insufficient information to code with codable children: Secondary | ICD-10-CM

## 2013-08-10 DIAGNOSIS — G2581 Restless legs syndrome: Secondary | ICD-10-CM

## 2013-08-10 DIAGNOSIS — K2270 Barrett's esophagus without dysplasia: Secondary | ICD-10-CM

## 2013-08-10 DIAGNOSIS — S93401S Sprain of unspecified ligament of right ankle, sequela: Secondary | ICD-10-CM

## 2013-08-10 DIAGNOSIS — G8929 Other chronic pain: Secondary | ICD-10-CM

## 2013-08-10 MED ORDER — CIMETIDINE 800 MG PO TABS: 800.0000 mg | ORAL_TABLET | Freq: Three times a day (TID) | ORAL | Status: DC | PRN

## 2013-08-10 MED ORDER — HYDROCODONE-ACETAMINOPHEN 10-325 MG PO TABS: 1.0000 | ORAL_TABLET | Freq: Three times a day (TID) | ORAL | Status: DC | PRN

## 2013-08-10 MED ORDER — OMEPRAZOLE 40 MG PO CPDR: 40.0000 mg | DELAYED_RELEASE_CAPSULE | Freq: Two times a day (BID) | ORAL | Status: DC

## 2013-08-10 MED ORDER — TRIAMTERENE-HCTZ 37.5-25 MG PO TABS: 1.0000 | ORAL_TABLET | Freq: Every day | ORAL | Status: DC

## 2013-08-10 MED ORDER — CLONAZEPAM 0.5 MG PO TABS: 0.5000 mg | ORAL_TABLET | Freq: Every evening | ORAL | Status: DC | PRN

## 2013-08-10 MED ORDER — DICLOFENAC SODIUM 75 MG PO TBEC: 75.0000 mg | DELAYED_RELEASE_TABLET | Freq: Two times a day (BID) | ORAL | Status: DC | PRN

## 2013-08-10 NOTE — Patient Instructions (Addendum)
I think that you are suffering from several problems causing the pain in the back, hip, and right ankle.   First, I am concerned that the chronic pain in the right hip has caused lots of weakness to the muscles that stabilize your hip and pelvis and this leads to pain and increased risk of falls. With the fall in August when you turned your right ankle, you sprained ligaments of the ankle as well and a tendon that wraps around the outside of the ankle and possible the achilles.   For now, please continue to use the Norco, Voltaren, hot tub, and TENS units. We need to get you into sports medicine sometime this month, because I would like to look at the foot and hip with an ultrasound. I can be there anytime except Wednesdays and Friday mornings, but you should schedule whenever an appointment is available for you.   We will need to increase your exercise regimen once the pain is improved.   Sincerely,   Dr. [name] Sawyer

## 2013-08-10 NOTE — Telephone Encounter (Signed)
Patient wanted to let you know that she has ultrasound of ankle scheduled at [facility] on 09/22 at 2pm.

## 2013-08-14 ENCOUNTER — Telehealth: Payer: Self-pay | Admitting: Family Medicine

## 2013-08-14 ENCOUNTER — Emergency Department (HOSPITAL_COMMUNITY)
Admission: EM | Admit: 2013-08-14 | Discharge: 2013-08-14 | Disposition: A | Discharge status: Home / Self Care | Payer: Medicaid Other | Attending: Emergency Medicine | Admitting: Emergency Medicine

## 2013-08-14 ENCOUNTER — Emergency Department (HOSPITAL_COMMUNITY): Payer: Medicaid Other

## 2013-08-14 ENCOUNTER — Encounter (HOSPITAL_COMMUNITY): Payer: Self-pay | Admitting: *Deleted

## 2013-08-14 DIAGNOSIS — K219 Gastro-esophageal reflux disease without esophagitis: Secondary | ICD-10-CM | POA: Insufficient documentation

## 2013-08-14 DIAGNOSIS — G43909 Migraine, unspecified, not intractable, without status migrainosus: Secondary | ICD-10-CM | POA: Insufficient documentation

## 2013-08-14 DIAGNOSIS — Z880 Allergy status to penicillin: Secondary | ICD-10-CM | POA: Insufficient documentation

## 2013-08-14 DIAGNOSIS — K5289 Other specified noninfective gastroenteritis and colitis: Secondary | ICD-10-CM | POA: Insufficient documentation

## 2013-08-14 DIAGNOSIS — Z8719 Personal history of other diseases of the digestive system: Secondary | ICD-10-CM | POA: Insufficient documentation

## 2013-08-14 DIAGNOSIS — Z79899 Other long term (current) drug therapy: Secondary | ICD-10-CM | POA: Insufficient documentation

## 2013-08-14 DIAGNOSIS — J45909 Unspecified asthma, uncomplicated: Secondary | ICD-10-CM | POA: Insufficient documentation

## 2013-08-14 DIAGNOSIS — M069 Rheumatoid arthritis, unspecified: Secondary | ICD-10-CM | POA: Insufficient documentation

## 2013-08-14 DIAGNOSIS — F17200 Nicotine dependence, unspecified, uncomplicated: Secondary | ICD-10-CM | POA: Insufficient documentation

## 2013-08-14 DIAGNOSIS — Z9889 Other specified postprocedural states: Secondary | ICD-10-CM | POA: Insufficient documentation

## 2013-08-14 DIAGNOSIS — F411 Generalized anxiety disorder: Secondary | ICD-10-CM | POA: Insufficient documentation

## 2013-08-14 DIAGNOSIS — K529 Noninfective gastroenteritis and colitis, unspecified: Secondary | ICD-10-CM

## 2013-08-14 DIAGNOSIS — Z9089 Acquired absence of other organs: Secondary | ICD-10-CM | POA: Insufficient documentation

## 2013-08-14 DIAGNOSIS — Z8619 Personal history of other infectious and parasitic diseases: Secondary | ICD-10-CM | POA: Insufficient documentation

## 2013-08-14 DIAGNOSIS — G8929 Other chronic pain: Secondary | ICD-10-CM | POA: Insufficient documentation

## 2013-08-14 DIAGNOSIS — I10 Essential (primary) hypertension: Secondary | ICD-10-CM | POA: Insufficient documentation

## 2013-08-14 DIAGNOSIS — Z90710 Acquired absence of both cervix and uterus: Secondary | ICD-10-CM | POA: Insufficient documentation

## 2013-08-14 DIAGNOSIS — E7800 Pure hypercholesterolemia, unspecified: Secondary | ICD-10-CM | POA: Insufficient documentation

## 2013-08-14 DIAGNOSIS — Z8739 Personal history of other diseases of the musculoskeletal system and connective tissue: Secondary | ICD-10-CM | POA: Insufficient documentation

## 2013-08-14 LAB — URINALYSIS, ROUTINE W REFLEX MICROSCOPIC
Bilirubin Urine: NEGATIVE
Glucose, UA: NEGATIVE mg/dL
Leukocytes, UA: NEGATIVE
Nitrite: NEGATIVE
Specific Gravity, Urine: 1.022 (ref 1.005–1.030)
pH: 6.5 (ref 5.0–8.0)

## 2013-08-14 LAB — COMPREHENSIVE METABOLIC PANEL
AST: 25 U/L (ref 0–37)
Albumin: 4 g/dL (ref 3.5–5.2)
Alkaline Phosphatase: 105 U/L (ref 39–117)
BUN: 9 mg/dL (ref 6–23)
Chloride: 88 mEq/L — ABNORMAL LOW (ref 96–112)
Potassium: 3.1 mEq/L — ABNORMAL LOW (ref 3.5–5.1)
Sodium: 130 mEq/L — ABNORMAL LOW (ref 135–145)
Total Bilirubin: 0.6 mg/dL (ref 0.3–1.2)
Total Protein: 8.1 g/dL (ref 6.0–8.3)

## 2013-08-14 LAB — CBC WITH DIFFERENTIAL/PLATELET
Basophils Absolute: 0 10*3/uL (ref 0.0–0.1)
Basophils Relative: 0 % (ref 0–1)
Eosinophils Absolute: 0.3 10*3/uL (ref 0.0–0.7)
Hemoglobin: 16.7 g/dL — ABNORMAL HIGH (ref 12.0–15.0)
MCH: 30.5 pg (ref 26.0–34.0)
MCHC: 35.5 g/dL (ref 30.0–36.0)
Monocytes Relative: 6 % (ref 3–12)
Neutro Abs: 6.9 10*3/uL (ref 1.7–7.7)
Neutrophils Relative %: 53 % (ref 43–77)
Platelets: 218 10*3/uL (ref 150–400)
RDW: 12 % (ref 11.5–15.5)

## 2013-08-14 LAB — LIPASE, BLOOD: Lipase: 36 U/L (ref 11–59)

## 2013-08-14 LAB — ABO/RH: ABO/RH(D): A POS

## 2013-08-14 LAB — TYPE AND SCREEN: Antibody Screen: NEGATIVE

## 2013-08-14 MED ORDER — CIPROFLOXACIN HCL 500 MG PO TABS: 500.0000 mg | ORAL_TABLET | Freq: Two times a day (BID) | ORAL | Status: DC

## 2013-08-14 MED ORDER — HYDROMORPHONE HCL PF 1 MG/ML IJ SOLN
0.5000 mg | Freq: Once | INTRAMUSCULAR | Status: AC
  Administered 2013-08-14: 0.5 mg via INTRAVENOUS
  Filled 2013-08-14: qty 1

## 2013-08-14 MED ORDER — HYDROCODONE-ACETAMINOPHEN 5-325 MG PO TABS: 1.0000 | ORAL_TABLET | Freq: Four times a day (QID) | ORAL | Status: DC | PRN

## 2013-08-14 MED ORDER — IOHEXOL 300 MG/ML  SOLN
25.0000 mL | INTRAMUSCULAR | Status: DC | PRN
  Administered 2013-08-14: 25 mL via ORAL

## 2013-08-14 MED ORDER — METRONIDAZOLE 500 MG PO TABS
500.0000 mg | ORAL_TABLET | Freq: Once | ORAL | Status: AC
  Administered 2013-08-14: 500 mg via ORAL
  Filled 2013-08-14: qty 1

## 2013-08-14 MED ORDER — ONDANSETRON 8 MG PO TBDP: 8.0000 mg | ORAL_TABLET | Freq: Three times a day (TID) | ORAL | Status: DC | PRN

## 2013-08-14 MED ORDER — METRONIDAZOLE 500 MG PO TABS: 500.0000 mg | ORAL_TABLET | Freq: Two times a day (BID) | ORAL | Status: DC

## 2013-08-14 MED ORDER — ONDANSETRON HCL 4 MG/2ML IJ SOLN
4.0000 mg | Freq: Once | INTRAMUSCULAR | Status: AC
  Administered 2013-08-14: 4 mg via INTRAVENOUS
  Filled 2013-08-14: qty 2

## 2013-08-14 MED ORDER — CIPROFLOXACIN IN D5W 400 MG/200ML IV SOLN
400.0000 mg | Freq: Two times a day (BID) | INTRAVENOUS | Status: DC
  Administered 2013-08-14: 400 mg via INTRAVENOUS
  Filled 2013-08-14: qty 200

## 2013-08-14 MED ORDER — IOHEXOL 300 MG/ML  SOLN
100.0000 mL | Freq: Once | INTRAMUSCULAR | Status: AC | PRN
  Administered 2013-08-14: 100 mL via INTRAVENOUS

## 2013-08-14 NOTE — Telephone Encounter (Signed)
Received call on the emergency line.  The patient states started getting sick 2 days ago with nausea and stomach cramping followed by vomiting. Also notes some flushing. Now complaining of liquid BMs with bright red blood in them. Note several episodes of being light headed upon standing from a sitting position. Also notes cramping [ain underneath her right breast. I advised her to come to the ED to be evaluated for these bloody stools given that she has some symptoms with the light headedness. She agreed with this plan.  Marikay Alar, MD [facility]

## 2013-08-14 NOTE — ED Provider Notes (Signed)
CSN: [id number]     Arrival date & time 08/14/13  1139 History   First MD Initiated Contact with Patient 08/14/13 1217     Chief Complaint  Patient presents with  . Abdominal Pain    HPI Pt started having pain in her abdomen about three days ago.  The pain is on the right side under her ribs as well as in the lower abdomen.  She has had recent vomiting and diarrhea.  She has had 2-3 times of vomiting and diarrhea.  She denies fever or trouble urinating.  She has noticed some bright red blood in stool when wiping. Past Medical History  Diagnosis Date  . Hypertension   . Ruptured lumbar disc 1990    L4-L5  . Choledocholithiasis with acute cholecystitis     removal of surgery   . Craniosynostosis     congenital  . Hypercholesterolemia 05/28/2012    Has taken Crestor and Lipitor in the past. Switched to lovastatin b/c its $4.    . Shingles    Past Surgical History  Procedure Laterality Date  . Abdominal hysterectomy  1992    Total for chronic pelvic pain   . Cholecystectomy  2007    Performed in [place]   . 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 Back surgery  1998    L4[facility]L5 fusion @ [facility]  . Appendectomy    . 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 History  Substance Use Topics  . Smoking status: Current Every Day Smoker -- 0.50 packs/day for 30 years    Types: Cigarettes  . Smokeless tobacco: Not on file  . Alcohol Use: Yes     Comment: occasional   OB History   Grav Para Term Preterm Abortions TAB SAB Ect Mult Living                 Review of Systems  All other systems reviewed and are negative.    Allergies  Penicillins; Demerol; Doxycycline hyclate; Gabapentin; Morphine and related; Sulfa antibiotics; Benadryl; and Cephalexin  Home Medications   Current Outpatient Rx  Name  Route  Sig   Dispense  Refill  . albuterol (PROVENTIL HFA;VENTOLIN HFA) 108 (90 BASE) MCG/ACT inhaler   Inhalation   Inhale 3 puffs into the lungs every 6 (six) hours as needed for wheezing.         . butalbital-acetaminophen-caffeine (FIORICET, ESGIC) 50-325-40 MG per tablet   Oral   Take 1 tablet by mouth daily as needed. For migraines   30 tablet   1   . CALCIUM-MAGNESIUM-ZINC PO   Oral   Take 1 tablet by mouth daily.          . cimetidine (TAGAMET) 800 MG tablet   Oral   Take 1 tablet (800 mg total) by mouth 3 (three) times daily as needed. For acid reflux   90 tablet   3     Dispense as written.   . clonazePAM (KLONOPIN) 0.5 MG tablet   Oral   Take 1 tablet (0.5 mg total) by mouth at bedtime as needed.   30 tablet   2   . Coenzyme Q10 (CO Q-10 PO)   Oral   Take 1 capsule by mouth daily.          . diclofenac (VOLTAREN)  75 MG EC tablet   Oral   Take 1 tablet (75 mg total) by mouth 2 (two) times daily as needed.   60 tablet   2   . Ergocalciferol (VITAMIN D2) 2000 UNITS TABS   Oral   Take 2,000 Units by mouth daily.         Marland Kitchen HYDROcodone-acetaminophen (NORCO) 10-325 MG per tablet   Oral   Take 1 tablet by mouth every 8 (eight) hours as needed.   90 tablet   0   . hydrOXYzine (VISTARIL) 25 MG capsule   Oral   Take 1 capsule (25 mg total) by mouth at bedtime as needed for itching.   60 capsule   2   . lidocaine (LIDODERM) 5 %   Transdermal   Place 1 patch onto the skin daily. Remove & Discard patch within 12 hours or as directed by MD         . Multiple Vitamin (MULITIVITAMIN WITH MINERALS) TABS   Oral   Take 1 tablet by mouth daily.         Marland Kitchen omeprazole (PRILOSEC) 40 MG capsule   Oral   Take 1 capsule (40 mg total) by mouth 2 (two) times daily.   180 capsule   3   . propranolol (INDERAL) 20 MG tablet   Oral   Take 1 tablet (20 mg total) by mouth 2 (two) times daily.   60 tablet   11   . simvastatin (ZOCOR) 40 MG tablet   Oral   Take 1  tablet (40 mg total) by mouth at bedtime.   30 tablet   11   . triamterene-hydrochlorothiazide (MAXZIDE-25) 37.5-25 MG per tablet   Oral   Take 1 tablet by mouth daily.   90 tablet   3   . TURMERIC PO   Oral   Take 1 tablet by mouth 2 (two) times daily.          Marland Kitchen zolpidem (AMBIEN) 10 MG tablet   Oral   Take 1 tablet (10 mg total) by mouth at bedtime as needed.   30 tablet   5   . ciprofloxacin (CIPRO) 500 MG tablet   Oral   Take 1 tablet (500 mg total) by mouth every 12 (twelve) hours.   20 tablet   0   . HYDROcodone-acetaminophen (NORCO) 5-325 MG per tablet   Oral   Take 1-2 tablets by mouth every 6 (six) hours as needed for pain.   30 tablet   0   . metroNIDAZOLE (FLAGYL) 500 MG tablet   Oral   Take 1 tablet (500 mg total) by mouth 2 (two) times daily.   30 tablet   0   . ondansetron (ZOFRAN ODT) 8 MG disintegrating tablet   Oral   Take 1 tablet (8 mg total) by mouth every 8 (eight) hours as needed for nausea.   20 tablet   0    BP 115/74  Pulse 68  Temp(Src) 98.2 F (36.8 C) (Oral)  Resp 14  SpO2 97% Physical Exam  Nursing note and vitals reviewed. Constitutional: She appears well-developed and well-nourished. No distress.  HENT:  Head: Normocephalic and atraumatic.  Right Ear: External ear normal.  Left Ear: External ear normal.  Eyes: Conjunctivae are normal. Right eye exhibits no discharge. Left eye exhibits no discharge. No scleral icterus.  Neck: Neck supple. No tracheal deviation present.  Cardiovascular: Normal rate, regular rhythm and intact distal pulses.   Pulmonary/Chest: Effort normal and breath  sounds normal. No stridor. No respiratory distress. She has no wheezes. She has no rales.  Abdominal: Soft. Bowel sounds are normal. She exhibits no distension. There is tenderness in the right lower quadrant. There is no rebound and no guarding. No hernia.  Genitourinary:  Mucoid bloody stool on rectal exam  Musculoskeletal: She exhibits no  edema and no tenderness.  Neurological: She is alert. She has normal strength. No sensory deficit. Cranial nerve deficit:  no gross defecits noted. She exhibits normal muscle tone. She displays no seizure activity. Coordination normal.  Skin: Skin is warm and dry. No rash noted.  Psychiatric: She has a normal mood and affect.    ED Course  Procedures (including critical care time) Labs Review Labs Reviewed  CBC WITH DIFFERENTIAL - Abnormal; Notable for the following:    WBC 13.0 (*)    RBC 5.48 (*)    Hemoglobin 16.7 (*)    HCT 47.0 (*)    Lymphs Abs 5.0 (*)    All other components within normal limits  COMPREHENSIVE METABOLIC PANEL - Abnormal; Notable for the following:    Sodium 130 (*)    Potassium 3.1 (*)    Chloride 88 (*)    Glucose, Bld 102 (*)    GFR calc non Af Amer 56 (*)    GFR calc Af Amer 65 (*)    All other components within normal limits  LIPASE, BLOOD  URINALYSIS, ROUTINE W REFLEX MICROSCOPIC  OCCULT BLOOD GASTRIC / DUODENUM (SPECIMEN CUP)  TYPE AND SCREEN  ABO/RH   Imaging Review Ct Abdomen Pelvis W Contrast  08/14/2013   CLINICAL DATA:  Abdominal pain, cramping for 3 days  EXAM: CT ABDOMEN AND PELVIS WITH CONTRAST  TECHNIQUE: Multidetector CT imaging of the abdomen and pelvis was performed using the standard protocol following bolus administration of intravenous contrast.  CONTRAST:  OMNIPAQUE IOHEXOL 300 MG/ML  SOLN  COMPARISON:  12/06/2012  FINDINGS: Sagittal images of the spine shows no destructive bony lesions. Postsurgical changes are noted at L4-L5 disc space.  Lung bases are unremarkable. Enhanced liver shows no focal mass. The patient is status postcholecystectomy. CBD measures 9 mm in diameter. The pancreas, spleen and adrenal glands are unremarkable. Mild atherosclerotic calcifications and plaques are noted within abdominal aorta and iliac arteries.  No aortic aneurysm. Kidneys are symmetrical in size and enhancement. No hydronephrosis or  hydroureter.  Delayed renal images shows bilateral renal symmetrical excretion.  No small bowel obstruction. There is no pericecal inflammation. The terminal ileum is unremarkable.  There are streaky artifacts from right hip prosthesis. Urinary bladder is unremarkable.  In axial images 51 through 60 there is short segment thickening of the proximal sigmoid colon wall. Findings are consistent with short segment colitis. This is confirmed in coronal image 44. This abnormal segment of the colon measures at least 10 cm in length. Minimal stranding of surrounding fat. No pericolonic abscess or fluid collection. Some stool noted in distal sigmoid colon. The urinary bladder is unremarkable. The patient is status post hysterectomy. Appendix is surgically absent.  IMPRESSION: 1. There is abnormal thickening of colonic wall in proximal sigmoid colon with mild stranding of surrounding fat. Findings are consistent with segmental colitis. No pericolonic fluid collection or pericolonic abscess. 2. No small bowel obstruction. No ascites or free air. 3. No pericecal inflammation. Surgically absent appendix 4. Status postcholecystectomy. 5. Status post hysterectomy. 6. Limited evaluation of the pelvis due to streak artifacts from right hip prosthesis.   Electronically Signed  ByNatasha Mead   On: 08/14/2013 15:21   Medications  iohexol (OMNIPAQUE) 300 MG/ML solution 25 mL (25 mLs Oral Contrast Given 08/14/13 1357)  ciprofloxacin (CIPRO) IVPB 400 mg (400 mg Intravenous New Bag/Given 08/14/13 1618)  metroNIDAZOLE (FLAGYL) tablet 500 mg (not administered)  HYDROmorphone (DILAUDID) injection 0.5 mg (0.5 mg Intravenous Given 08/14/13 1330)  ondansetron (ZOFRAN) injection 4 mg (4 mg Intravenous Given 08/14/13 1328)  iohexol (OMNIPAQUE) 300 MG/ML solution 100 mL (100 mLs Intravenous Contrast Given 08/14/13 1455)  HYDROmorphone (DILAUDID) injection 0.5 mg (0.5 mg Intravenous Given 08/14/13 1528)  HYDROmorphone (DILAUDID) injection 0.5  mg (0.5 mg Intravenous Given 08/14/13 1616)    MDM   1. Colitis    Patient's CT scan shows a focal colitis. I suspect this is infectious in etiology. Patient has no risk factors for ischemic bowel disease.  She does not have a history of Crohn's or ulcerative colitis. I discussed inpatient versus outpatient treatment. Patient would prefer to be discharged home. I will discharge her home on a course of oral antibiotics and medications for pain and nausea. Patient understands to return for any worsening symptoms.  Recommended she follow up with her primary Dr. to make sure the symptoms resolve.    Celene Kras, MD 08/14/13 [phone]

## 2013-08-14 NOTE — ED Notes (Signed)
Pt is here for abdominal pain which began 3 days ago.  Pt has lower abdominal cramping and pressure and cramping in RUQ.  Pt also has had n/v/d with this.  Pt has had bright red blood in her stool.  Pt has been feeling generally weak with this.  She spoke with her PCP and he advised her to come to ED.

## 2013-08-14 NOTE — ED Notes (Signed)
Patient transported to X-ray 

## 2013-08-14 NOTE — Assessment & Plan Note (Signed)
Assessment: Patient with sprain of ATFL and possible inflammation of achilles tendon on exam, marked weakness of the right abductor muscles likely sequelae of deconditiong after hip arthoplasty years ago, but will likely continue to high risk for falls if nt strengthened Plan: continue pain medicine and treatment regimen that is used for chronic pain; use brace only if walking otherwise needs to work  ROM of ankle; will refer to sports med for further eval of ankle pain and hip weakness

## 2013-08-14 NOTE — Progress Notes (Signed)
  Subjective:    Patient ID: Diana [name], female    DOB: Apr 17, 1956, 57 y.o.   MRN: [id number]  HPI  57 year old F who presents today as follow up for right ankle pain that is subacute and right hip and back pain that is chronic. Patient seen for initial evaluation of right ankle pain on 07/25/13 and diagnosed with ankle sprain after obtaining X-ray negative for a fracture. Since that time, the patient has had persistent right ankle pain. She is wearing an air cast, walking with a cane for support, taking diclofenac and norco as usual for her chronic hip and back pain as well as using the TENs unit and hot tub which she also does routinely for back pain. The pain is worsened by walking. It wakes her from sleep. She has not had any new falls or trauma. Her right anterior hip pain and chronic lower back pain are also prevalent.    Review of Systems See HPI    Objective:   Physical Exam BP 170/94  Pulse 96  Ht 5\' 1"  (1.549 m)  Wt 141 lb (63.957 kg)  BMI 26.66 kg/m2 WUJ:[id number] non ill appearing but lying on table due to pain when I enter the room Right ankle: no edema of bruising, passive and active ROM limited by pain, poor effort when measuring strength of dorsi and plantar flexion,inversion and eversion; marked TTP on AFTL and sub-lateral malleolus region, marked tenderness along achilles 2-3 cm distal to gastrocnemius, negative thompson test Right Hip: marked weakness of hip abduction in neutral positon, flexion and extension       Assessment & Plan:

## 2013-08-17 ENCOUNTER — Encounter (HOSPITAL_COMMUNITY): Payer: Self-pay | Admitting: General Practice

## 2013-08-17 ENCOUNTER — Inpatient Hospital Stay (HOSPITAL_COMMUNITY)
Admission: AD | Admit: 2013-08-17 | Discharge: 2013-08-19 | DRG: 392 | Disposition: A | Discharge status: Home / Self Care | Payer: Medicaid Other | Source: Ambulatory Visit | Attending: Family Medicine | Admitting: Family Medicine

## 2013-08-17 ENCOUNTER — Inpatient Hospital Stay (HOSPITAL_COMMUNITY): Payer: Medicaid Other

## 2013-08-17 ENCOUNTER — Ambulatory Visit (INDEPENDENT_AMBULATORY_CARE_PROVIDER_SITE_OTHER): Payer: No Typology Code available for payment source | Admitting: Family Medicine

## 2013-08-17 ENCOUNTER — Encounter: Payer: Self-pay | Admitting: Family Medicine

## 2013-08-17 VITALS — BP 112/76 | HR 75 | Temp 97.6°F | Ht 61.0 in | Wt 136.0 lb

## 2013-08-17 DIAGNOSIS — M25559 Pain in unspecified hip: Secondary | ICD-10-CM | POA: Diagnosis present

## 2013-08-17 DIAGNOSIS — Z79899 Other long term (current) drug therapy: Secondary | ICD-10-CM

## 2013-08-17 DIAGNOSIS — F3289 Other specified depressive episodes: Secondary | ICD-10-CM | POA: Diagnosis present

## 2013-08-17 DIAGNOSIS — I10 Essential (primary) hypertension: Secondary | ICD-10-CM

## 2013-08-17 DIAGNOSIS — F411 Generalized anxiety disorder: Secondary | ICD-10-CM | POA: Diagnosis present

## 2013-08-17 DIAGNOSIS — Z23 Encounter for immunization: Secondary | ICD-10-CM

## 2013-08-17 DIAGNOSIS — E871 Hypo-osmolality and hyponatremia: Secondary | ICD-10-CM | POA: Diagnosis present

## 2013-08-17 DIAGNOSIS — Z981 Arthrodesis status: Secondary | ICD-10-CM

## 2013-08-17 DIAGNOSIS — A09 Infectious gastroenteritis and colitis, unspecified: Principal | ICD-10-CM | POA: Diagnosis present

## 2013-08-17 DIAGNOSIS — R112 Nausea with vomiting, unspecified: Secondary | ICD-10-CM

## 2013-08-17 DIAGNOSIS — Z880 Allergy status to penicillin: Secondary | ICD-10-CM

## 2013-08-17 DIAGNOSIS — E861 Hypovolemia: Secondary | ICD-10-CM | POA: Diagnosis present

## 2013-08-17 DIAGNOSIS — F329 Major depressive disorder, single episode, unspecified: Secondary | ICD-10-CM

## 2013-08-17 DIAGNOSIS — F17200 Nicotine dependence, unspecified, uncomplicated: Secondary | ICD-10-CM

## 2013-08-17 DIAGNOSIS — K529 Noninfective gastroenteritis and colitis, unspecified: Secondary | ICD-10-CM

## 2013-08-17 DIAGNOSIS — E7800 Pure hypercholesterolemia, unspecified: Secondary | ICD-10-CM | POA: Diagnosis present

## 2013-08-17 DIAGNOSIS — E876 Hypokalemia: Secondary | ICD-10-CM | POA: Diagnosis present

## 2013-08-17 DIAGNOSIS — G8929 Other chronic pain: Secondary | ICD-10-CM

## 2013-08-17 DIAGNOSIS — F17209 Nicotine dependence, unspecified, with unspecified nicotine-induced disorders: Secondary | ICD-10-CM

## 2013-08-17 DIAGNOSIS — F419 Anxiety disorder, unspecified: Secondary | ICD-10-CM

## 2013-08-17 DIAGNOSIS — Z96649 Presence of unspecified artificial hip joint: Secondary | ICD-10-CM

## 2013-08-17 DIAGNOSIS — E785 Hyperlipidemia, unspecified: Secondary | ICD-10-CM | POA: Diagnosis present

## 2013-08-17 DIAGNOSIS — M549 Dorsalgia, unspecified: Secondary | ICD-10-CM | POA: Diagnosis present

## 2013-08-17 DIAGNOSIS — K449 Diaphragmatic hernia without obstruction or gangrene: Secondary | ICD-10-CM

## 2013-08-17 DIAGNOSIS — K589 Irritable bowel syndrome without diarrhea: Secondary | ICD-10-CM | POA: Diagnosis present

## 2013-08-17 HISTORY — DX: Nausea with vomiting, unspecified: Z98.890

## 2013-08-17 HISTORY — DX: Unspecified asthma, uncomplicated: J45.909

## 2013-08-17 HISTORY — DX: Low back pain, unspecified: M54.50

## 2013-08-17 HISTORY — DX: Low back pain: M54.5

## 2013-08-17 HISTORY — DX: Irritable bowel syndrome, unspecified: K58.9

## 2013-08-17 HISTORY — DX: Personal history of other medical treatment: Z92.89

## 2013-08-17 HISTORY — DX: Other specified postprocedural states: R11.2

## 2013-08-17 HISTORY — DX: Migraine, unspecified, not intractable, without status migrainosus: G43.909

## 2013-08-17 HISTORY — DX: Reserved for concepts with insufficient information to code with codable children: IMO0002

## 2013-08-17 HISTORY — DX: Personal history of other diseases of the digestive system: Z87.19

## 2013-08-17 HISTORY — DX: Barrett's esophagus without dysplasia: K22.70

## 2013-08-17 HISTORY — DX: Family history of other specified conditions: Z84.89

## 2013-08-17 HISTORY — DX: Other chronic pain: G89.29

## 2013-08-17 HISTORY — DX: Rheumatoid arthritis, unspecified: M06.9

## 2013-08-17 HISTORY — DX: Anxiety disorder, unspecified: F41.9

## 2013-08-17 HISTORY — DX: Gastro-esophageal reflux disease without esophagitis: K21.9

## 2013-08-17 LAB — CBC WITH DIFFERENTIAL/PLATELET
Eosinophils Relative: 2 % (ref 0–5)
Hemoglobin: 13.8 g/dL (ref 12.0–15.0)
Lymphocytes Relative: 39 % (ref 12–46)
Lymphs Abs: 3.9 10*3/uL (ref 0.7–4.0)
MCV: 86.7 fL (ref 78.0–100.0)
Neutrophils Relative %: 51 % (ref 43–77)
Platelets: 169 10*3/uL (ref 150–400)
RBC: 4.58 MIL/uL (ref 3.87–5.11)
WBC: 10 10*3/uL (ref 4.0–10.5)

## 2013-08-17 LAB — COMPREHENSIVE METABOLIC PANEL
ALT: 18 U/L (ref 0–35)
Alkaline Phosphatase: 82 U/L (ref 39–117)
CO2: 27 mEq/L (ref 19–32)
GFR calc Af Amer: 37 mL/min — ABNORMAL LOW (ref >90)
GFR calc non Af Amer: 32 mL/min — ABNORMAL LOW (ref >90)
Glucose, Bld: 141 mg/dL — ABNORMAL HIGH (ref 70–99)
Potassium: 3.4 mEq/L — ABNORMAL LOW (ref 3.5–5.1)
Sodium: 131 mEq/L — ABNORMAL LOW (ref 135–145)
Total Bilirubin: 0.5 mg/dL (ref 0.3–1.2)

## 2013-08-17 MED ORDER — METRONIDAZOLE IN NACL 5-0.79 MG/ML-% IV SOLN
500.0000 mg | Freq: Three times a day (TID) | INTRAVENOUS | Status: DC
  Administered 2013-08-17 – 2013-08-19 (×6): 500 mg via INTRAVENOUS
  Filled 2013-08-17 (×9): qty 100

## 2013-08-17 MED ORDER — ACETAMINOPHEN 650 MG RE SUPP: 650.0000 mg | Freq: Four times a day (QID) | RECTAL | Status: DC | PRN

## 2013-08-17 MED ORDER — PNEUMOCOCCAL VAC POLYVALENT 25 MCG/0.5ML IJ INJ
0.5000 mL | INJECTION | INTRAMUSCULAR | Status: AC
  Administered 2013-08-18: 0.5 mL via INTRAMUSCULAR
  Filled 2013-08-17: qty 0.5

## 2013-08-17 MED ORDER — IOHEXOL 300 MG/ML  SOLN
80.0000 mL | Freq: Once | INTRAMUSCULAR | Status: AC | PRN
  Administered 2013-08-17: 80 mL via INTRAVENOUS

## 2013-08-17 MED ORDER — POTASSIUM CHLORIDE IN NACL 20-0.9 MEQ/L-% IV SOLN
INTRAVENOUS | Status: DC
  Administered 2013-08-17 – 2013-08-19 (×3): via INTRAVENOUS
  Filled 2013-08-17 (×7): qty 1000

## 2013-08-17 MED ORDER — IOHEXOL 300 MG/ML  SOLN
25.0000 mL | INTRAMUSCULAR | Status: AC
  Administered 2013-08-17 (×2): 25 mL via ORAL

## 2013-08-17 MED ORDER — HYDROMORPHONE HCL PF 1 MG/ML IJ SOLN
1.0000 mg | INTRAMUSCULAR | Status: DC | PRN
  Administered 2013-08-17 – 2013-08-18 (×3): 1 mg via INTRAVENOUS
  Filled 2013-08-17 (×3): qty 1

## 2013-08-17 MED ORDER — INFLUENZA VAC SPLIT QUAD 0.5 ML IM SUSP
0.5000 mL | INTRAMUSCULAR | Status: AC
  Administered 2013-08-18: 0.5 mL via INTRAMUSCULAR
  Filled 2013-08-17: qty 0.5

## 2013-08-17 MED ORDER — PROMETHAZINE HCL 25 MG PO TABS: 12.5000 mg | ORAL_TABLET | Freq: Four times a day (QID) | ORAL | Status: DC | PRN

## 2013-08-17 MED ORDER — PROMETHAZINE HCL 25 MG/ML IJ SOLN
12.5000 mg | Freq: Four times a day (QID) | INTRAMUSCULAR | Status: DC | PRN
  Administered 2013-08-17: 12.5 mg via INTRAVENOUS

## 2013-08-17 MED ORDER — HYDROMORPHONE HCL 2 MG PO TABS: 0.5000 mg | ORAL_TABLET | Freq: Once | ORAL | Status: DC

## 2013-08-17 MED ORDER — CIPROFLOXACIN IN D5W 400 MG/200ML IV SOLN
400.0000 mg | Freq: Two times a day (BID) | INTRAVENOUS | Status: DC
  Filled 2013-08-17 (×2): qty 200

## 2013-08-17 MED ORDER — CIPROFLOXACIN IN D5W 400 MG/200ML IV SOLN
400.0000 mg | Freq: Two times a day (BID) | INTRAVENOUS | Status: DC
  Administered 2013-08-17 – 2013-08-19 (×4): 400 mg via INTRAVENOUS
  Filled 2013-08-17 (×6): qty 200

## 2013-08-17 MED ORDER — ACETAMINOPHEN 325 MG PO TABS
650.0000 mg | ORAL_TABLET | Freq: Four times a day (QID) | ORAL | Status: DC | PRN
  Administered 2013-08-18: 650 mg via ORAL
  Filled 2013-08-17: qty 2

## 2013-08-17 MED ORDER — PROMETHAZINE HCL 25 MG/ML IJ SOLN
25.0000 mg | Freq: Once | INTRAMUSCULAR | Status: AC
  Administered 2013-08-17: 25 mg via INTRAMUSCULAR

## 2013-08-17 MED ORDER — PROMETHAZINE HCL 25 MG/ML IJ SOLN
25.0000 mg | Freq: Four times a day (QID) | INTRAMUSCULAR | Status: DC | PRN
  Filled 2013-08-17 (×2): qty 1

## 2013-08-17 NOTE — H&P (Signed)
Family Medicine Teaching [facility] Admission History and Physical Service Pager: [phone]  Patient name: Diana [name] Medical record number: [id number] Date of birth: 06-08-1956 Age: 56 y.o. Gender: female  Primary Care Provider: Mat Carne, MD Consultants: none Code Status: full  Chief Complaint: abdominal pain, nausea vomiting, hematochezia  Assessment and Plan: Diana [name] is a 57 y.o. female presenting with severe abdominal pain, nausea, vomiting and bloody diarrhea consistent with colitis which was diagnosed via CT scan on 08/14/2013 . PMH is significant for Barrett's esophagus, chronic pain, and irritable bowel syndrome.   # Infectious colitis - Nausea, vomiting, bloody diarrhea consistent with previous symptoms of colitis. Unfortunately, the patient is having worsening pain and symptoms and needs further evaluation. At this time her exam is not consistent with an acute abdomen, no need for emergent surgery consult. - Obtain CBC to assess blood loss and inflammatory reaction - Start IV Cipro and Flagyl - Obtain repeat CT scan of abdomen given worsening pain on the right side - Give Dilaudid when necessary for pain given intolerance of morphine, avoid NSAIDs - Phenergan when necessary for nausea - Consider alternative causes of bloody diarrhea such as colon cancer, diverticular bleed, or ischemic colitis is planned treatment course does not improve symptoms  # Electrolyte derangements - patient with hyponatremia and hypokalemia while in the ED 4 days ago, most likely due to diarrhea from colitis - Check metabolic panel to reassess  # Hypertension - restart home meds as needed for elevated blood pressure and as tolerated after treatment for nausea  # Hyperlipidemia - hold statin until tolerating by mouth  # Chronic back and hip pain - hold all by mouth pain medications and Lidoderm patches while patient receiving IV pain medication  # Tobacco abuse - patient  half-pack per day smoker, may need nicotine patch  # FEN/GI: - Start patient on normal saline with 20 of KCl at 100 mL per hour - Clear liquid diet  # Prophylaxis: for blood thinners and antithrombotic skin and active bleeding, use SCDs  Disposition: Admit to family medicine teaching service  History of Present Illness: Diana [name] is a 57 y.o. female presenting with severe abdominal pain, nausea and vomiting, and bloody diarrhea for 6 days duration. The patient presented to the emergency department on September 14 for this problem. She was noted to have evidence of colitis on CT scan. Therefore she was treated with Cipro and Flagyl as well as IV pain medications. It was suggested that she be admitted for pain control, but patient resisted and was discharged to home. The patient notes that since returning home her pain has worsened and is located on the right side of her lower abdomen. It is accompanied by persistent nausea and vomiting. She cannot keep down her antibiotics. She is also having persistent diarrhea and fecal incontinence. She has noted persistent bloody diarrhea. She denies fever or chills.   Review Of Systems: Per HPI with the following additions: none Otherwise 12 point review of systems was performed and was unremarkable.  Patient Active Problem List   Diagnosis Date Noted  . Right ankle pain 07/26/2013    Priority: Medium  . Irritable bowel syndrome (IBS) 06/07/2012    Priority: Medium  . Anxiety and depression 05/28/2012    Priority: Medium  . Hypertension, essential, benign 05/28/2012    Priority: Medium  . Tobacco use disorder, continuous 05/28/2012    Priority: Medium  . Hypercholesterolemia 05/28/2012    Priority: Medium  .  Chronic back pain 05/20/2012    Priority: Medium  . Barrett's esophagus 05/20/2012    Priority: Medium  . Colon cancer screening 03/27/2013    Priority: Low  . Atrophic vaginitis 08/28/2012    Priority: Low  . Restless leg syndrome  05/28/2012    Priority: Low  . Insomnia 05/28/2012    Priority: Low  . Hiatal hernia 05/20/2012    Priority: Low  . Sinusitis 06/13/2013   Past Medical History: Past Medical History  Diagnosis Date  . Hypertension   . Ruptured lumbar disc 1990    L4-L5  . Choledocholithiasis with acute cholecystitis     removal of surgery   . Craniosynostosis     congenital  . Hypercholesterolemia 05/28/2012    Has taken Crestor and Lipitor in the past. Switched to lovastatin b/c its $4.    . Shingles    Past Surgical History: Past Surgical History  Procedure Laterality Date  . Abdominal hysterectomy  1992    Total for chronic pelvic pain   . Cholecystectomy  2007    Performed in [place]   . 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 Back surgery  1998    L4-L5 fusion @ [facility]  . Appendectomy    . Craniectomy for craniosynostosis  1959    surgery x 3; congenital    Social History: History  Substance Use Topics  . Smoking status: Current Every Day Smoker -- 0.50 packs/day for 30 years    Types: Cigarettes  . Smokeless tobacco: Not on file  . Alcohol Use: Yes     Comment: occasional   Additional social history: none  Please also refer to relevant sections of EMR.  Family History: No family history on file. Allergies and Medications: Allergies  Allergen Reactions  . Penicillins Anaphylaxis  . Demerol [Meperidine] Nausea And Vomiting  . Doxycycline Hyclate Nausea And Vomiting  . Gabapentin Other (See Comments)    Malaise  . Morphine And Related Nausea And Vomiting  . Sulfa Antibiotics Hives  . Benadryl [Diphenhydramine Hcl] Rash  . Cephalexin Rash   No current facility-administered medications on file prior to encounter.   Current Outpatient Prescriptions on File Prior to Encounter  Medication Sig Dispense Refill  . albuterol (PROVENTIL HFA;VENTOLIN HFA) 108 (90  BASE) MCG/ACT inhaler Inhale 3 puffs into the lungs every 6 (six) hours as needed for wheezing.      . butalbital-acetaminophen-caffeine (FIORICET, ESGIC) 50-325-40 MG per tablet Take 1 tablet by mouth daily as needed. For migraines  30 tablet  1  . CALCIUM-MAGNESIUM-ZINC PO Take 1 tablet by mouth daily.       . cimetidine (TAGAMET) 800 MG tablet Take 1 tablet (800 mg total) by mouth 3 (three) times daily as needed. For acid reflux  90 tablet  3  . ciprofloxacin (CIPRO) 500 MG tablet Take 1 tablet (500 mg total) by mouth every 12 (twelve) hours.  20 tablet  0  . clonazePAM (KLONOPIN) 0.5 MG tablet Take 1 tablet (0.5 mg total) by mouth at bedtime as needed.  30 tablet  2  . Coenzyme Q10 (CO Q-10 PO) Take 1 capsule by mouth daily.       . diclofenac (VOLTAREN) 75 MG EC tablet Take 1 tablet (75 mg total) by mouth 2 (two) times daily as needed.  60 tablet  2  . Ergocalciferol (VITAMIN D2) 2000 UNITS TABS  Take 2,000 Units by mouth daily.      Marland Kitchen HYDROcodone-acetaminophen (NORCO) 10-325 MG per tablet Take 1 tablet by mouth every 8 (eight) hours as needed.  90 tablet  0  . HYDROcodone-acetaminophen (NORCO) 5-325 MG per tablet Take 1-2 tablets by mouth every 6 (six) hours as needed for pain.  30 tablet  0  . hydrOXYzine (VISTARIL) 25 MG capsule Take 1 capsule (25 mg total) by mouth at bedtime as needed for itching.  60 capsule  2  . lidocaine (LIDODERM) 5 % Place 1 patch onto the skin daily. Remove & Discard patch within 12 hours or as directed by MD      . metroNIDAZOLE (FLAGYL) 500 MG tablet Take 1 tablet (500 mg total) by mouth 2 (two) times daily.  30 tablet  0  . Multiple Vitamin (MULITIVITAMIN WITH MINERALS) TABS Take 1 tablet by mouth daily.      Marland Kitchen omeprazole (PRILOSEC) 40 MG capsule Take 1 capsule (40 mg total) by mouth 2 (two) times daily.  180 capsule  3  . ondansetron (ZOFRAN ODT) 8 MG disintegrating tablet Take 1 tablet (8 mg total) by mouth every 8 (eight) hours as needed for nausea.  20 tablet   0  . propranolol (INDERAL) 20 MG tablet Take 1 tablet (20 mg total) by mouth 2 (two) times daily.  60 tablet  11  . simvastatin (ZOCOR) 40 MG tablet Take 1 tablet (40 mg total) by mouth at bedtime.  30 tablet  11  . triamterene-hydrochlorothiazide (MAXZIDE-25) 37.5-25 MG per tablet Take 1 tablet by mouth daily.  90 tablet  3  . TURMERIC PO Take 1 tablet by mouth 2 (two) times daily.       Marland Kitchen zolpidem (AMBIEN) 10 MG tablet Take 1 tablet (10 mg total) by mouth at bedtime as needed.  30 tablet  5    Objective: BP 134/71  Pulse 72  Temp(Src) 98.1 F (36.7 C) (Oral)  Resp 16  Ht 5\' 1"  (1.549 m)  Wt 135 lb 4.8 oz (61.372 kg)  BMI 25.58 kg/m2  SpO2 100% Exam: General: middle-aged white female, very distressed, nontoxic-appearing HEENT: pupils equal round react to light, oropharynx clear and moist, no lymphadenopathy Cardiovascular: regular rate and rhythm, no murmurs rubs or gallops Respiratory: lungs clear to auscultation bilaterally Abdomen: soft without guarding exquisitely tender to palpation in the right lower quadrant, hypoactive bowel sounds Rectal: normal tone no gross bleeding Extremities: chronic tenderness palpation of right hip and currently with right ankle sprain in brace Skin: warm and dry, no rashes or mottling Neuro: alert and oriented x3, no focal deficits  Labs and Imaging: CBC BMET   Recent Labs Lab 08/14/13 1211  WBC 13.0*  HGB 16.7*  HCT 47.0*  PLT 218    Recent Labs Lab 08/14/13 1211  NA 130*  K 3.1*  CL 88*  CO2 28  BUN 9  CREATININE 1.10  GLUCOSE 102*  CALCIUM 10.0      Garnetta Buddy, MD 08/17/2013, 1:55 PM PGY-3, [place] [facility] FPTS Intern pager: [phone], text pages welcome   FMTS Attending Admit Note  Patient seen and examined with Dr [name] Sawyer, discussed care plan with him and I agree with assess/plan.  Patient presents in follow up for worsening abdominal pain and bloody diarrhea with nausea and intractable  vomiting, inability to tolerate by mouth.  Abdominal tenderness that is worse in the RLQ than the LLQ; abdomen is soft, she has audible bowel sounds. She is  status post appy and hysterectomy.  Reviewed results of labs and CT done in ED 3 days prior. Plan to admit for IV hydration, labs including CBC with diff, metabolic panel, C diff PCR, stool guaiac.  Repeat CT abd/pelvis imaging given worsening pain that is lateralizing to the RLQ.  IV Cipro/Flagyl (patient doubts she has been able to tolerate oral abx given at discharge from ED). Paula Compton, MD

## 2013-08-17 NOTE — Progress Notes (Signed)
Interim Progress Note S: Patient reports improvement in her nausea with the IM phenergan.  Just received dilaudid for pain.  Abdominal pain currently in the RLQ.  Tolerating some clears now.  Still with liquid stools.  O: BP 134/71  Pulse 72  Temp(Src) 98.1 F (36.7 C) (Oral)  Resp 16  Ht 5\' 1"  (1.549 m)  Wt 135 lb 4.8 oz (61.372 kg)  BMI 25.58 kg/m2  SpO2 100% Gen: alert, cooperative, NAD, drinking sweet tea CV: RRR, no murmurs Pulm: CTAB Abd: +BS, soft, ND, tender in RLQ less so in LLQ, no rebound or guarding, no peritoneal signs  A/P: 57 yo woman with n/v/d/abd pain and hematochezia.  Colitis seen on CT on Sunday. - Likely colitis based on symptoms and CT scan, some concern for progression of infection - IV cipro and flagyl - phenergan and dilaudid prn - will add esr to evaluate for new onset IBD - will also check for c diff - clears for now, advance as tolerated  No SQ heparin for now given positive FOBT.  SCDs for DVT ppx.  If hemoglobin stable will likely start pharmacologic ppx.  Vishnu Moeller 08/17/2013, 3:56 PM

## 2013-08-17 NOTE — Progress Notes (Signed)
  Subjective:    Patient ID: Rance Muir, female    DOB: 22-Mar-1956, 57 y.o.   MRN: [id number]  HPI  57 year old female with chronic low back and right hip pain who presents for followup from her recent visit to the emergency department. On September 14, the patient presented to the ED with pain in her abdomen vomiting and diarrhea as well as bright red blood in her stool. Physical exam noted bloody mucoid stool.a CT scan demonstrated segmental colitis in the proximal sigmoid colon. She was diagnosed with infectious colitis and started on ciprofloxacin and metronidazole. Additional studies noted hypokalemia and hyponatremia.   Patient with worsening pain, nausea, vomiting and diarrhea.   Review of Systems     Objective:   Physical Exam BP 112/76  Pulse 75  Temp(Src) 97.6 F (36.4 C) (Oral)  Ht 5\' 1"  (1.549 m)  Wt 136 lb (61.689 kg)  BMI 25.71 kg/m2   See history and physical.      Assessment & Plan:  Patient to be admitted to the hospital and all documentation of the history and physical.

## 2013-08-17 NOTE — Progress Notes (Signed)
Dr Waynetta Sandy notified of arrival

## 2013-08-17 NOTE — Progress Notes (Signed)
Arrived to 6n11 from office, c/o abd pain, oriented to room and surroundings,

## 2013-08-18 DIAGNOSIS — K529 Noninfective gastroenteritis and colitis, unspecified: Secondary | ICD-10-CM

## 2013-08-18 LAB — BASIC METABOLIC PANEL
BUN: 6 mg/dL (ref 6–23)
Calcium: 8.5 mg/dL (ref 8.4–10.5)
Calcium: 8.8 mg/dL (ref 8.4–10.5)
Creatinine, Ser: 1.26 mg/dL — ABNORMAL HIGH (ref 0.50–1.10)
GFR calc non Af Amer: 47 mL/min — ABNORMAL LOW (ref >90)
GFR calc non Af Amer: 59 mL/min — ABNORMAL LOW (ref >90)
Glucose, Bld: 111 mg/dL — ABNORMAL HIGH (ref 70–99)
Glucose, Bld: 92 mg/dL (ref 70–99)
Sodium: 132 mEq/L — ABNORMAL LOW (ref 135–145)

## 2013-08-18 LAB — CBC
Hemoglobin: 12.4 g/dL (ref 12.0–15.0)
MCH: 30.2 pg (ref 26.0–34.0)
MCHC: 35.3 g/dL (ref 30.0–36.0)
Platelets: 159 10*3/uL (ref 150–400)

## 2013-08-18 MED ORDER — METOCLOPRAMIDE HCL 5 MG PO TABS
5.0000 mg | ORAL_TABLET | Freq: Four times a day (QID) | ORAL | Status: DC | PRN
Start: 2013-08-18 — End: 2013-08-19

## 2013-08-18 MED ORDER — HYDROCODONE-ACETAMINOPHEN 5-325 MG PO TABS
1.0000 | ORAL_TABLET | Freq: Four times a day (QID) | ORAL | Status: DC | PRN
  Administered 2013-08-18 – 2013-08-19 (×2): 2 via ORAL
  Filled 2013-08-18 (×2): qty 2

## 2013-08-18 MED ORDER — SIMVASTATIN 40 MG PO TABS
40.0000 mg | ORAL_TABLET | Freq: Every day | ORAL | Status: DC
  Administered 2013-08-18: 40 mg via ORAL
  Filled 2013-08-18: qty 4
  Filled 2013-08-18: qty 1

## 2013-08-18 MED ORDER — CLONAZEPAM 0.5 MG PO TABS
0.5000 mg | ORAL_TABLET | Freq: Once | ORAL | Status: AC
  Administered 2013-08-18: 0.5 mg via ORAL
  Filled 2013-08-18: qty 1

## 2013-08-18 MED ORDER — PROMETHAZINE HCL 25 MG RE SUPP
25.0000 mg | Freq: Four times a day (QID) | RECTAL | Status: DC | PRN
  Administered 2013-08-18: 25 mg via RECTAL
  Filled 2013-08-18: qty 1

## 2013-08-18 NOTE — Progress Notes (Signed)
Family Medicine Teaching Service Daily Progress Note Intern Pager: [phone]  Patient name: Diana [name] Medical record number: [id number] Date of birth: 1955-12-11 Age: 57 y.o. Gender: female  Primary Care Provider: Mat Carne, MD Consultants: None Code Status: Full  Pt Overview and Major Events to Date:  09/17- no evidence of perf, interval improvement  Assessment and Plan: # Infectious colitis - Nausea, vomiting, bloody diarrhea consistent with previous symptoms of colitis. CT scan shows interval improvement no perf or abscess. On IV cipro and flagyl. Still having RLQ pain and diarrhea but no longer bloody (only with wiping). Symptomatically improved. Less concerning for IBD picture given ESR 8. Plan: -phenergan and reglan prn -tylenol and norco -advance diet as tolerated, blands today -continue rehdyration -will need transition to po abx and completion of 14 day course -need for colonoscopy as outpt?  # Electrolyte derangements - patient still with hyponatremia and hypokalemia most likely due to diarrhea from colitis  Plan -NS with 20kcl -advance diet -BMET  # Hypertension - normotensive this am Plan -cont home meds as needed for elevated blood pressure   # Hyperlipidemia - was holding statin until tolerating by mouth  Plan -give statin today  # Chronic back and hip pain - on pain meds at home Plan -managing as above  # Tobacco abuse - patient half-pack per day smoker Plan -may need nicotine patch  FEN/GI: NS @100  with 20kcl PPx: SCDs  Disposition: pending toleration of orals, symptomatic improvement  Subjective: Still with copious diarrhea but no more n/v. Having abd discomfort and rectal soreness. Burning at IV site  Objective: Temp:  [97.6 F (36.4 C)-98.8 F (37.1 C)] 98.8 F (37.1 C) (09/18 0534) Pulse Rate:  [70-76] 76 (09/18 0534) Resp:  [15-16] 16 (09/18 0534) BP: (112-134)/(51-76) 119/64 mmHg (09/18 0534) SpO2:  [100 %] 100 % (09/18  0534) Weight:  [135 lb 4.8 oz (61.372 kg)-136 lb (61.689 kg)] 135 lb 4.8 oz (61.372 kg) (09/17 1353) Physical Exam: General: NAD, ambulating from bathroom to bed Cardiovascular: RRR, no murmurs Respiratory: CTAB Abdomen: hyperactive BS, soft/ND/tender in right lower diet, no peritoneal signs Extremities: warm and well perfused   Laboratory:  Recent Labs Lab 08/14/13 1211 08/17/13 1524 08/18/13 0420  WBC 13.0* 10.0 8.5  HGB 16.7* 13.8 12.4  HCT 47.0* 39.7 35.1*  PLT 218 169 159    Recent Labs Lab 08/14/13 1211 08/17/13 1524 08/18/13 0420  NA 130* 131* 128*  K 3.1* 3.4* 3.2*  CL 88* 91* 89*  CO2 28 27 31   BUN 9 10 6   CREATININE 1.10 1.76* 1.26*  CALCIUM 10.0 9.3 8.5  PROT 8.1 6.6  --   BILITOT 0.6 0.5  --   ALKPHOS 105 82  --   ALT 28 18  --   AST 25 20  --   GLUCOSE 102* 141* 111*    Imaging/Diagnostic Tests: CT 08/17/13 Improved colitis. No evidence of perforation or abscess.  Anselm Lis, MD 08/18/2013, 7:52 AM PGY-1, [facility] FPTS Intern pager: [phone], text pages welcome

## 2013-08-18 NOTE — Progress Notes (Signed)
Pt assessed by 2 RN's KOA and SA peripheral IV insertion

## 2013-08-18 NOTE — Progress Notes (Signed)
INITIAL NUTRITION ASSESSMENT  DOCUMENTATION CODES Per approved criteria  -Not Applicable   INTERVENTION: Diet advancement per MD discretion Encouraged adequate PO intake as tolerated RD to monitor and add supplements if po intake remains <75% of meals  NUTRITION DIAGNOSIS: Unintentional wt loss related to nausea and vomiting as evidenced by 4% wt loss in one week.   Goal: Pt to meet >/= 90% of their estimated nutrition needs   Monitor:  PO intake Diet advancement Weight Labs  Reason for Assessment: Malnutrition Screening Tool, score of 2  57 y.o. female  Admitting Dx: Colitis  ASSESSMENT: 57 y.o. female presenting with severe abdominal pain, nausea, vomiting and bloody diarrhea consistent with colitis which was diagnosed via CT scan on 08/14/2013 . PMH is significant for Barrett's esophagus, chronic pain, and irritable bowel syndrome.  Pt reports that she usually weighs 140 lbs; for the past week she has been eating but, has been unable to keep any food down causing her to lose 5 lbs. Pt reports feeling better today with a good appetite, no nausea and vomiting but, continued diarrhea. Discussed diet tips to help resolve diarrhea. She states she ate 100 % of clear liquid tray early this morning but, only 25% of bland diet breakfast tray because she didn't like the food and was already somewhat full.   Height: Ht Readings from Last 1 Encounters:  08/17/13 5\' 1"  (1.549 m)    Weight: Wt Readings from Last 1 Encounters:  08/17/13 135 lb 4.8 oz (61.372 kg)    Ideal Body Weight: 105 lbs  % Ideal Body Weight: 129%  Wt Readings from Last 10 Encounters:  08/17/13 135 lb 4.8 oz (61.372 kg)  08/17/13 136 lb (61.689 kg)  08/10/13 141 lb (63.957 kg)  07/25/13 139 lb (63.05 kg)  06/09/13 140 lb 8 oz (63.73 kg)  05/23/13 141 lb (63.957 kg)  03/21/13 141 lb 8 oz (64.184 kg)  02/16/13 143 lb (64.864 kg)  01/25/13 138 lb (62.596 kg)  01/17/13 140 lb (63.504 kg)    Usual Body  Weight: 140 lbs  % Usual Body Weight: 96%  BMI:  Body mass index is 25.58 kg/(m^2).  Estimated Nutritional Needs: Kcal: 1500-1700 Protein: 60-70 grams Fluid: 1.8-2.1 L/day  Skin: intact  Diet Order: Parke Simmers  EDUCATION NEEDS: -No education needs identified at this time   Intake/Output Summary (Last 24 hours) at 08/18/13 1309 Last data filed at 08/18/13 0600  Gross per 24 hour  Intake   1600 ml  Output      4 ml  Net   1596 ml    Last BM: 9/17   Labs:   Recent Labs Lab 08/14/13 1211 08/17/13 1524 08/18/13 0420  NA 130* 131* 128*  K 3.1* 3.4* 3.2*  CL 88* 91* 89*  CO2 28 27 31   BUN 9 10 6   CREATININE 1.10 1.76* 1.26*  CALCIUM 10.0 9.3 8.5  GLUCOSE 102* 141* 111*    CBG (last 3)  No results found for this basename: GLUCAP,  in the last 72 hours  Scheduled Meds: . ciprofloxacin  400 mg Intravenous Q12H  . metronidazole  500 mg Intravenous Q8H  . simvastatin  40 mg Oral q1800    Continuous Infusions: . 0.9 % NaCl with KCl 20 mEq / L 100 mL/hr at 08/18/13 1102    Past Medical History  Diagnosis Date  . Hypertension   . Ruptured lumbar disc 1990's    L4-L5  . Choledocholithiasis with acute cholecystitis   . Craniosynostosis  congenital  . Hypercholesterolemia 05/28/2012    Has taken Crestor and Lipitor in the past. Switched to lovastatin b/c its $4.    . Shingles   . IBS (irritable bowel syndrome)   . PONV (postoperative nausea and vomiting)   . Family history of anesthesia complication     "PONV; both parents" (08/17/2013)  . Barrett esophagus     "don't know that I've ever been stretched" (08/17/2013)  . Asthma   . History of blood transfusion 1959-1960  . GERD (gastroesophageal reflux disease)   . H/O hiatal hernia   . Migraines     "since age 67; probably twice/month" (08/17/2013)  . Rheumatoid arthritis     "in my knuckles" (08/17/2013)  . Chronic lower back pain   . DDD (degenerative disc disease)   . Anxiety     Past Surgical  History  Procedure Laterality Date  . Abdominal hysterectomy  1992    Total for chronic pelvic pain   . Cholecystectomy  2007    Performed in [place]   . 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 Craniectomy for craniosynostosis  19591 1960    "1st time got infected; 2nd time didn't take; fix the 3rd time" (08/17/2013)  . Appendectomy    . Back surgery  1990's    L4-L5 fusion @ [facility]  . Posterior lumbar fusion  1990's    "took piece off my hip" (08/17/2013)    Ian Malkin RD, LDN Inpatient Clinical Dietitian Pager: [phone] After Hours Pager: [phone]

## 2013-08-18 NOTE — Progress Notes (Signed)
FMTS Attending Note  I personally saw and evaluated the patient. The plan of care was discussed with the resident team. I agree with the assessment and plan as documented by the resident.   Patient still has RLQ abdominal pain, improved from yesterday, still has nausea and loose stools, has had previous cholecystectomy and appendectomy  1. Infectious Colitis - improved on Cipro/Flagyl, continue IVF and antiemetics, advance diet as tolerated 2. Hypokalemia/hypovolemia hyponatremia - due to #1, replace electrolytes, monitor BMP's  If tolerating diet could consider discharge in next 24-48 hours.  Donnella Sham MD

## 2013-08-18 NOTE — H&P (Signed)
Patient seen and examined by me on day of admission; please see my note appended to the resident H&P for this patient.  Paula Compton, MD

## 2013-08-19 DIAGNOSIS — F341 Dysthymic disorder: Secondary | ICD-10-CM

## 2013-08-19 NOTE — Care Management Note (Signed)
  Page 1 of 1   08/19/2013     10:14:26 AM   CARE MANAGEMENT NOTE 08/19/2013  Patient:  Diana [name], Diana [name]   Account Number:  [id number]  Date Initiated:  08/19/2013  Documentation initiated by:  Ronny Flurry  Subjective/Objective Assessment:     Action/Plan:   Anticipated DC Date:  08/19/2013   Anticipated DC Plan:  HOME/SELF CARE      DC Planning Services  MATCH Program      Choice offered to / List presented to:             Status of service:  Completed, signed off Medicare Important Message given?   (If response is "NO", the following Medicare IM given date fields will be blank) Date Medicare IM given:   Date Additional Medicare IM given:    Discharge Disposition:    Per UR Regulation:  Reviewed for med. necessity/level of care/duration of stay  If discussed at Long Length of Stay Meetings, dates discussed:    Comments:  08-19-13 medication assistance letter given and explained to patient . Patient voiced understanding and that she can pay $3 , and that pain medication is not covered . Ronny Flurry RN [name] [phone]

## 2013-08-19 NOTE — Discharge Summary (Signed)
FMTS Attending Note  I personally saw and evaluated the patient. The plan of care was discussed with the resident team. I agree with the assessment and plan as documented by the resident.   Agree with discharge home.   Donnella Sham MD

## 2013-08-19 NOTE — Discharge Summary (Signed)
[facility] Discharge Summary  Patient name: Diana [name] Medical record number: [id number] Date of birth: February 12, 1956 Age: 57 y.o. Gender: female Date of Admission: 08/17/2013  Date of Discharge: 08/19/13 Admitting Physician: Uvaldo Rising, MD  Primary Care Provider: Mat Carne, MD Consultants: None  Indication for Hospitalization: N/V/copious diarrhea  Discharge Diagnoses/Problem List:  IBS Colitis HTN Tobacco use Chronic back pain Anxiety/depression  Disposition: discharge to home  Discharge Condition: improved  Discharge Exam:  BP 111/55  Pulse 67  Temp(Src) 98 F (36.7 C) (Oral)  Resp 16  Ht 5\' 1"  (1.549 m)  Wt 135 lb 4.8 oz (61.372 kg)  BMI 25.58 kg/m2  SpO2 98% General: NAD, appears much better this am,ambulating from bathroom to bed, husband sleeping quietly at side Cardiovascular: RRR, no murmurs  Respiratory: CTAB  Abdomen: hyperactive BS, soft/ND/tender in left lower diet, no peritoneal signs  Extremities: warm and well perfused    Brief Hospital Course:  Diana [name] is a 57 y.o. female who presented for persistent severe abdominal pain, nausea, vomiting and bloody diarrhea which was consistent with colitis prev diagnosed via CT scan on 08/14/2013. PMH is significant for Barrett's esophagus, chronic pain, and irritable bowel syndrome  # Infectious colitis - Patient initially presented to the ED 09/14 with symptoms of abd pain, n, v and diarrhea where she was noted to have colitis on CT. She was given Rx of cipro and flagyl and was d/c per patient's wishes (although ED wanted admission for pain control) Since d/c patient had worsening pain in right lower quad, persistent bloody diarrhea and episodes of fecal incontinence which prompted her to rtc 09/17. Patient was admitted for iv hydration, CBC with diff, BMET, cdiff and stool guaiac. Was placed on cipro/flaygl IV. Started on dilaudid and tylenol for pain control. Cdiff noted  to be neg. CT show interval improvement in colitis no perf or abscess. FOBT was post. ESR was obtained given concern for IBD picture with bloody diarrhea, but was 8. Of note patient has not been able to have colonoscopy due to lack of insurance. Has had normal fobt in the past. Patient continued to improve slowly with rehydration and small introduction of solid foods. Pain meds transitioned to tylenol and norco for breakthrough. By day of discharge was taking reg full diet, had significant less abdominal discomfort and was having more formed non bloody stools. She was d/c to complete 14 day abx course. (will need an additional 8 days)  # Hyponatremia/hypokalemia - Metabolic derangements most likely 2/2 to dehydration/GI losses. Seems to be consistent with hypovolemic hyponatremia. K was added to NS given concern for poor absorption with diarrhea. Bmets were trended. Expect to improve with continued improvement in diet over the next few days. K 3.5 after repletion.  # Hypertension - Stable. Continued home meds for elevated BPs.    # Hyperlipidemia -Initially held statin until pt tolerating PO. Restarted prior to d/c.   # Chronic back and hip pain - On pain meds at home. Patient's home meds were held and she was started on tylenol and dilaudid for break through. Once she was tolerating PO she was transitioned to norco.  # Tobacco abuse - Patient is a half-pack per day smoker  Would recommend discuss of cessation as outpt.  Issues for Follow Up:  1. Medication compliance- completion of 14 day course abx  2. Resolution of hematochezia 3. FOBT cards if patient cannot get colonoscopy 2/2 no insurance 4. Tobacco cessation  Significant Procedures: None  Significant Labs and Imaging:   Recent Labs Lab 08/14/13 1211 08/17/13 1524 08/18/13 0420  WBC 13.0* 10.0 8.5  HGB 16.7* 13.8 12.4  HCT 47.0* 39.7 35.1*  PLT 218 169 159    Recent Labs Lab 08/14/13 1211 08/17/13 1524 08/18/13 0420  08/18/13 1537  NA 130* 131* 128* 132*  K 3.1* 3.4* 3.2* 3.5  CL 88* 91* 89* 95*  CO2 28 27 31 28   GLUCOSE 102* 141* 111* 92  BUN 9 10 6  4*  CREATININE 1.10 1.76* 1.26* 1.05  CALCIUM 10.0 9.3 8.5 8.8  ALKPHOS 105 82  --   --   AST 25 20  --   --   ALT 28 18  --   --   ALBUMIN 4.0 3.5  --   --      Results/Tests Pending at Time of Discharge: None  Discharge Medications:    Medication List         albuterol 108 (90 BASE) MCG/ACT inhaler  Commonly known as:  PROVENTIL HFA;VENTOLIN HFA  Inhale 3 puffs into the lungs every 6 (six) hours as needed for wheezing.     butalbital-acetaminophen-caffeine 50-325-40 MG per tablet  Commonly known as:  FIORICET, ESGIC  Take 1 tablet by mouth daily as needed. For migraines     CALCIUM-MAGNESIUM-ZINC PO  Take 1 tablet by mouth daily.     cimetidine 800 MG tablet  Commonly known as:  TAGAMET  Take 1 tablet (800 mg total) by mouth 3 (three) times daily as needed. For acid reflux     ciprofloxacin 500 MG tablet  Commonly known as:  CIPRO  Take 1 tablet (500 mg total) by mouth every 12 (twelve) hours.     clonazePAM 0.5 MG tablet  Commonly known as:  KLONOPIN  Take 1 tablet (0.5 mg total) by mouth at bedtime as needed.     CO Q-10 PO  Take 1 capsule by mouth daily.     diclofenac 75 MG EC tablet  Commonly known as:  VOLTAREN  Take 1 tablet (75 mg total) by mouth 2 (two) times daily as needed.     HYDROcodone-acetaminophen 10-325 MG per tablet  Commonly known as:  NORCO  Take 1 tablet by mouth every 8 (eight) hours as needed.     hydrOXYzine 25 MG capsule  Commonly known as:  VISTARIL  Take 1 capsule (25 mg total) by mouth at bedtime as needed for itching.     lidocaine 5 %  Commonly known as:  LIDODERM  Place 1 patch onto the skin daily. Remove & Discard patch within 12 hours or as directed by MD     metroNIDAZOLE 500 MG tablet  Commonly known as:  FLAGYL  Take 1 tablet (500 mg total) by mouth 2 (two) times daily.      multivitamin with minerals Tabs tablet  Take 1 tablet by mouth daily.     omeprazole 40 MG capsule  Commonly known as:  PRILOSEC  Take 1 capsule (40 mg total) by mouth 2 (two) times daily.     ondansetron 8 MG disintegrating tablet  Commonly known as:  ZOFRAN ODT  Take 1 tablet (8 mg total) by mouth every 8 (eight) hours as needed for nausea.     PROBIOTIC DAILY PO  Take 2 tablets by mouth daily.     propranolol 20 MG tablet  Commonly known as:  INDERAL  Take 1 tablet (20 mg total) by mouth 2 (two)  times daily.     simvastatin 40 MG tablet  Commonly known as:  ZOCOR  Take 1 tablet (40 mg total) by mouth at bedtime.     triamterene-hydrochlorothiazide 37.5-25 MG per tablet  Commonly known as:  MAXZIDE-25  Take 1 tablet by mouth daily.     TURMERIC PO  Take 1 tablet by mouth 2 (two) times daily.     Vitamin D2 2000 UNITS Tabs  Take 2,000 Units by mouth daily.     zolpidem 10 MG tablet  Commonly known as:  AMBIEN  Take 1 tablet (10 mg total) by mouth at bedtime as needed.        Discharge Instructions: Please refer to Patient Instructions section of EMR for full details.  Patient was counseled important signs and symptoms that should prompt return to medical care, changes in medications, dietary instructions, activity restrictions, and follow up appointments.   Follow-Up Appointments: Follow-up Information   Follow up with Mat Carne, MD On 08/24/2013. (at 1015)    Specialty:  Family Medicine   Contact information:   [address] [place] [address] [phone]       Anselm Lis, MD 08/19/2013, 12:22 PM PGY-1, [facility]

## 2013-08-19 NOTE — Progress Notes (Signed)
DC home with daughter, verbally understood dc instructions, no questions ask

## 2013-08-22 ENCOUNTER — Ambulatory Visit (INDEPENDENT_AMBULATORY_CARE_PROVIDER_SITE_OTHER): Payer: No Typology Code available for payment source | Admitting: Family Medicine

## 2013-08-22 ENCOUNTER — Encounter: Payer: Self-pay | Admitting: Family Medicine

## 2013-08-22 VITALS — BP 108/69 | HR 83 | Ht 61.0 in | Wt 135.0 lb

## 2013-08-22 DIAGNOSIS — M25569 Pain in unspecified knee: Secondary | ICD-10-CM

## 2013-08-22 DIAGNOSIS — M25571 Pain in right ankle and joints of right foot: Secondary | ICD-10-CM

## 2013-08-22 DIAGNOSIS — M25579 Pain in unspecified ankle and joints of unspecified foot: Secondary | ICD-10-CM

## 2013-08-22 DIAGNOSIS — M25561 Pain in right knee: Secondary | ICD-10-CM | POA: Insufficient documentation

## 2013-08-22 NOTE — Assessment & Plan Note (Signed)
Assessment: persistent ankle pain due to torn ATFL ligament Plan: patient given instructions for phase I of ankle rehabilitation exercises as well as a rubber band to use to perform these exercises. She will do this for 4 weeks and followup with family medicine clinic. No medications were given. Indications for further imaging.

## 2013-08-22 NOTE — Assessment & Plan Note (Signed)
Assessment: right posterior knee pain likely due to effusion from antalgic gait, but also the Baker cyst, no evidence of infection or gout Plan: Ice when necessary, reassess in 4 weeks

## 2013-08-22 NOTE — Progress Notes (Signed)
  Subjective:    Patient ID: Diana [name], female    DOB: [birth year], 57 y.o.   MRN: [id number]  HPI  57 year old female with history of ankle injury one month ago. It occurred while she was walking in her home and turned her ankle. She was evaluated by her primary care physician immediately after this injury. Plain films were obtained to rule out fracture, and the patient was diagnosed with a severe sprain of the ATFL. She is instructed to use an air cast for immobilization and continue with her home pain medication regimen for her chronic pain which includes Voltaren twice a day and Norco when necessary. She presented to research her PCP with worsening pain around the lateral malleolus and decreased range of motion concern for possible tear of the peroneal tendons. Therefore she was sent to sports medicine for further evaluation. Today she notes that her pain is worsening. It is located right lateral ankle posterior and inferior to the lateral malleolus. Also moves onto the dorsum of her midfoot. She continues to wear her Aircast. She is also taking her medication daily. She's had no new falls.  Of note patient hospitalized last week for infectious colitis.  Review of Systems Right posterior knee pain of one week's duration     Objective:   Physical Exam BP 108/69  Pulse 83  Ht 5\' 1"  (1.549 m)  Wt 135 lb (61.236 kg)  BMI 25.52 kg/m2 Gen. Middle-aged white female, non-ill appearing, pleasant and conversant VOZ:DGUYQIH non ill appearing but lying on table due to pain when I enter the room  Right ankle: no edema of bruising, passive and active ROM limited by pain, poor effort when measuring strength of dorsi and plantar flexion,inversion and eversion; marked TTP on AFTL and sub-lateral malleolus region, no tenderness along achilles, negative thompson test Knee: right posterior knee with tenderness to palpation and fullness consistent with effusion  Musculoskeletal ultrasound performed in long  and short axis views: hernias longus and Proteus brevis tendons visualized posterior and inferior to the lateral malleolus and both were intact without evidence of injury; ATFL visualize with large hypoechoic area and possible complete tear of the ligament     Assessment & Plan:

## 2013-08-22 NOTE — Patient Instructions (Addendum)
Dear [name],   It was nice to see you today. Please read below about the issues that we discussed.   1. Right ankle pain - The good news is that your tendons are intact, so we don't need surgery. The bad news is you have a severe injury and tear to the ligaments on the outside of the foot. We need to rehab your foot. Please see handout.   2. Right Hip Weakness - Please use the sheet for guided exercises.   3. Poster Knee Pain on Right - this may be due to your walking style. We will have to take this a couple weeks at a time.   Please follow up with me in 4 weeks.   Sincerely,   Dr. [name] Sawyer

## 2013-08-24 ENCOUNTER — Ambulatory Visit: Payer: Self-pay | Admitting: Family Medicine

## 2013-09-08 ENCOUNTER — Encounter (HOSPITAL_COMMUNITY): Payer: Self-pay | Admitting: Emergency Medicine

## 2013-09-08 ENCOUNTER — Emergency Department (HOSPITAL_COMMUNITY)
Admission: EM | Admit: 2013-09-08 | Discharge: 2013-09-09 | Disposition: A | Discharge status: Home / Self Care | Payer: Medicaid Other | Attending: Emergency Medicine | Admitting: Emergency Medicine

## 2013-09-08 DIAGNOSIS — Z79899 Other long term (current) drug therapy: Secondary | ICD-10-CM | POA: Insufficient documentation

## 2013-09-08 DIAGNOSIS — F411 Generalized anxiety disorder: Secondary | ICD-10-CM | POA: Insufficient documentation

## 2013-09-08 DIAGNOSIS — Z8739 Personal history of other diseases of the musculoskeletal system and connective tissue: Secondary | ICD-10-CM | POA: Insufficient documentation

## 2013-09-08 DIAGNOSIS — T07XXXA Unspecified multiple injuries, initial encounter: Secondary | ICD-10-CM | POA: Insufficient documentation

## 2013-09-08 DIAGNOSIS — S79919A Unspecified injury of unspecified hip, initial encounter: Secondary | ICD-10-CM | POA: Insufficient documentation

## 2013-09-08 DIAGNOSIS — Z880 Allergy status to penicillin: Secondary | ICD-10-CM | POA: Insufficient documentation

## 2013-09-08 DIAGNOSIS — E7800 Pure hypercholesterolemia, unspecified: Secondary | ICD-10-CM | POA: Insufficient documentation

## 2013-09-08 DIAGNOSIS — Z8619 Personal history of other infectious and parasitic diseases: Secondary | ICD-10-CM | POA: Insufficient documentation

## 2013-09-08 DIAGNOSIS — K219 Gastro-esophageal reflux disease without esophagitis: Secondary | ICD-10-CM | POA: Insufficient documentation

## 2013-09-08 DIAGNOSIS — J45909 Unspecified asthma, uncomplicated: Secondary | ICD-10-CM | POA: Insufficient documentation

## 2013-09-08 DIAGNOSIS — W19XXXA Unspecified fall, initial encounter: Secondary | ICD-10-CM

## 2013-09-08 DIAGNOSIS — I10 Essential (primary) hypertension: Secondary | ICD-10-CM | POA: Insufficient documentation

## 2013-09-08 DIAGNOSIS — W010XXA Fall on same level from slipping, tripping and stumbling without subsequent striking against object, initial encounter: Secondary | ICD-10-CM | POA: Insufficient documentation

## 2013-09-08 DIAGNOSIS — IMO0002 Reserved for concepts with insufficient information to code with codable children: Secondary | ICD-10-CM | POA: Insufficient documentation

## 2013-09-08 DIAGNOSIS — Y9389 Activity, other specified: Secondary | ICD-10-CM | POA: Insufficient documentation

## 2013-09-08 DIAGNOSIS — Y9289 Other specified places as the place of occurrence of the external cause: Secondary | ICD-10-CM | POA: Insufficient documentation

## 2013-09-08 DIAGNOSIS — F17200 Nicotine dependence, unspecified, uncomplicated: Secondary | ICD-10-CM | POA: Insufficient documentation

## 2013-09-09 ENCOUNTER — Emergency Department (HOSPITAL_COMMUNITY): Payer: Medicaid Other

## 2013-09-09 MED ORDER — ONDANSETRON 4 MG PO TBDP
8.0000 mg | ORAL_TABLET | Freq: Once | ORAL | Status: AC
  Administered 2013-09-09: 8 mg via ORAL
  Filled 2013-09-09: qty 2

## 2013-09-09 MED ORDER — HYDROMORPHONE HCL PF 1 MG/ML IJ SOLN
1.0000 mg | Freq: Once | INTRAMUSCULAR | Status: AC
  Administered 2013-09-09: 1 mg via INTRAMUSCULAR
  Filled 2013-09-09: qty 1

## 2013-09-09 NOTE — ED Provider Notes (Signed)
CSN: [id number]     Arrival date & time 09/08/13  2159 History   First MD Initiated Contact with Patient 09/09/13 0034     Chief Complaint  Patient presents with  . Fall  . Shoulder Pain  . Back Pain  . Hip Pain   HPI  History provided by the patient and significant other. Patient is a 57 year old female with history of hypertension, IBS, chronic back pain who presents with complaints of left shoulder and left hip pain after a fall. Patient reports coming out of the restroom and slipping on a wet floor causing her to fall onto her left hip and shoulder area. Patient denies any head injury or LOC. She has been able to stand and walk some with her cane which she always uses. This was very difficult with severe pains in the hip specialist bearing weight as well as pain in her left shoulder and arm area. Patient did not use any medications for her symptoms. She denies any other aggravating or alleviating factors. She denies any associated weakness or numbness in extremities. Denies any neck or back pain. No other associated symptoms.    Past Medical History  Diagnosis Date  . Hypertension   . Ruptured lumbar disc 1990's    L4-L5  . Choledocholithiasis with acute cholecystitis   . Craniosynostosis     congenital  . Hypercholesterolemia 05/28/2012    Has taken Crestor and Lipitor in the past. Switched to lovastatin b/c its $4.    . Shingles   . IBS (irritable bowel syndrome)   . PONV (postoperative nausea and vomiting)   . Family history of anesthesia complication     "PONV; both parents" (08/17/2013)  . Barrett esophagus     "don't know that I've ever been stretched" (08/17/2013)  . Asthma   . History of blood transfusion 1959-1960  . GERD (gastroesophageal reflux disease)   . H/O hiatal hernia   . Migraines     "since age 48; probably twice/month" (08/17/2013)  . Rheumatoid arthritis     "in my knuckles" (08/17/2013)  . Chronic lower back pain   . DDD (degenerative disc disease)   .  Anxiety    Past Surgical History  Procedure Laterality Date  . Abdominal hysterectomy  1992    Total for chronic pelvic pain   . Cholecystectomy  2007    Performed in [place]   . 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 Craniectomy for craniosynostosis  19591 1960    "1st time got infected; 2nd time didn't take; fix the 3rd time" (08/17/2013)  . Appendectomy    . Back surgery  1990's    L4-L5 fusion @ [facility]  . Posterior lumbar fusion  1990's    "took piece off my hip" (08/17/2013)   No family history on file. History  Substance Use Topics  . Smoking status: Current Every Day Smoker -- 1.00 packs/day for 32 years    Types: Cigarettes  . Smokeless tobacco: Never Used  . Alcohol Use: Yes     Comment: 08/17/2013 "couple mixed drinks/wk"   OB History   Grav Para Term Preterm Abortions TAB SAB Ect Mult Living                 Review of Systems  Respiratory: Negative for shortness of breath.   Cardiovascular: Negative for chest pain.  Musculoskeletal: Negative for back pain,  neck pain and neck stiffness.  Neurological: Negative for weakness, numbness and headaches.  All other systems reviewed and are negative.    Allergies  Penicillins; Demerol; Doxycycline hyclate; Gabapentin; Morphine and related; Sulfa antibiotics; Benadryl; and Cephalexin  Home Medications   Current Outpatient Rx  Name  Route  Sig  Dispense  Refill  . albuterol (PROVENTIL HFA;VENTOLIN HFA) 108 (90 BASE) MCG/ACT inhaler   Inhalation   Inhale 3 puffs into the lungs every 6 (six) hours as needed for wheezing.         . butalbital-acetaminophen-caffeine (FIORICET, ESGIC) 50-325-40 MG per tablet   Oral   Take 1 tablet by mouth daily as needed. For migraines   30 tablet   1   . CALCIUM-MAGNESIUM-ZINC PO   Oral   Take 1 tablet by mouth daily.          . cimetidine (TAGAMET) 800 MG tablet    Oral   Take 1 tablet (800 mg total) by mouth 3 (three) times daily as needed. For acid reflux   90 tablet   3     Dispense as written.   . clonazePAM (KLONOPIN) 0.5 MG tablet   Oral   Take 1 tablet (0.5 mg total) by mouth at bedtime as needed.   30 tablet   2   . Coenzyme Q10 (CO Q-10 PO)   Oral   Take 1 capsule by mouth daily.          . diclofenac (VOLTAREN) 75 MG EC tablet   Oral   Take 1 tablet (75 mg total) by mouth 2 (two) times daily as needed.   60 tablet   2   . Ergocalciferol (VITAMIN D2) 2000 UNITS TABS   Oral   Take 2,000 Units by mouth daily.         Marland Kitchen HYDROcodone-acetaminophen (NORCO) 10-325 MG per tablet   Oral   Take 1 tablet by mouth every 8 (eight) hours as needed.   90 tablet   0   . hydrOXYzine (VISTARIL) 25 MG capsule   Oral   Take 1 capsule (25 mg total) by mouth at bedtime as needed for itching.   60 capsule   2   . lidocaine (LIDODERM) 5 %   Transdermal   Place 1 patch onto the skin daily. Remove & Discard patch within 12 hours or as directed by MD         . Multiple Vitamin (MULITIVITAMIN WITH MINERALS) TABS   Oral   Take 1 tablet by mouth daily.         Marland Kitchen omeprazole (PRILOSEC) 40 MG capsule   Oral   Take 1 capsule (40 mg total) by mouth 2 (two) times daily.   180 capsule   3   . ondansetron (ZOFRAN ODT) 8 MG disintegrating tablet   Oral   Take 1 tablet (8 mg total) by mouth every 8 (eight) hours as needed for nausea.   20 tablet   0   . Probiotic Product (PROBIOTIC DAILY PO)   Oral   Take 2 tablets by mouth daily.         . propranolol (INDERAL) 20 MG tablet   Oral   Take 1 tablet (20 mg total) by mouth 2 (two) times daily.   60 tablet   11   . simvastatin (ZOCOR) 40 MG tablet   Oral   Take 1 tablet (40 mg total) by mouth at bedtime.   30 tablet   11   .  triamterene-hydrochlorothiazide (MAXZIDE-25) 37.5-25 MG per tablet   Oral   Take 1 tablet by mouth daily.   90 tablet   3   . TURMERIC PO   Oral    Take 1 tablet by mouth 2 (two) times daily.          Marland Kitchen zolpidem (AMBIEN) 10 MG tablet   Oral   Take 1 tablet (10 mg total) by mouth at bedtime as needed.   30 tablet   5    BP 150/71  Pulse 116  Temp(Src) 98.4 F (36.9 C) (Oral)  Resp 12  SpO2 97% Physical Exam  Nursing note and vitals reviewed. Constitutional: She is oriented to person, place, and time. She appears well-developed and well-nourished. No distress.  HENT:  Head: Normocephalic.  Neck: Normal range of motion. Neck supple.  No cervical spine tenderness  Cardiovascular: Normal rate and regular rhythm.   Pulmonary/Chest: Effort normal and breath sounds normal. No respiratory distress. She has no wheezes. She has no rales.  Abdominal: Soft. There is no tenderness.  Musculoskeletal: She exhibits tenderness.  Reduced range of motion of left shoulder secondary to pain. There is no gross deformity. Mild pains also along the clavicle without deformity or step-off. Normal distal pulses in the left wrist. Normal grip strength, sensation in hand and capillary refill.  Pain over the lateral left hip area. Pain with passive range of motion otherwise full. No gross deformity. No shortening or rotation of the lower leg. Normal sensation and pulses in the foot.  Neurological: She is alert and oriented to person, place, and time.  Skin: Skin is warm and dry. No rash noted.  Psychiatric: She has a normal mood and affect. Her behavior is normal.    ED Course  Procedures   DIAGNOSTIC STUDIES: Oxygen Saturation is 97% on room air.    COORDINATION OF CARE:  Nursing notes reviewed. Vital signs reviewed. Initial pt interview and examination performed.  Treatment plan initiated: Medications  HYDROmorphone (DILAUDID) injection 1 mg (1 mg Intramuscular Given 09/09/13 0144)  ondansetron (ZOFRAN-ODT) disintegrating tablet 8 mg (8 mg Oral Given 09/09/13 0144)   Patient feeling better after medications. X-rays were reviewed without  signs of acute fractures or other injuries. Patient was able to stand and bear weight on her left leg. She did have some pain and slight limp but was able to take several steps with mild assistance.  Discussed x-ray findings and treatment plan with pt at bedside, which includes continued home pain medications and rest with ice over her sore areas. Pt agrees with plan.        Imaging Review Dg Hip Complete Left  09/09/2013   *RADIOLOGY REPORT*  Clinical Data: Status post fall; left lateral hip pain.  LEFT HIP - COMPLETE 2+ VIEW  Comparison: None.  Findings: There is no evidence of fracture or dislocation.  The left femoral head remains seated at its acetabulum.  No significant degenerative change is seen.  The right hip total arthroplasty is grossly unremarkable in appearance, though the femoral stem is incompletely imaged.  The patient is status post interbody fusion at L4-L5.  The lower lumbar spine is otherwise unremarkable in appearance.  The sacroiliac joints are within normal limits.  The visualized bowel gas pattern is grossly unremarkable in appearance.  IMPRESSION: No evidence of fracture or dislocation.   Original Report Authenticated By: Tonia Ghent, M.D.   Dg Shoulder Left  09/09/2013   *RADIOLOGY REPORT*  Clinical Data: Status post fall; anterior left  shoulder pain.  LEFT SHOULDER - 2+ VIEW  Comparison: Left shoulder radiographs performed 01/15/2006  Findings: There is no evidence of fracture or dislocation.  The left humeral head is seated within the glenoid fossa.  The acromioclavicular joint is unremarkable in appearance.  No significant soft tissue abnormalities are seen.  The visualized portions of the left lung are clear.  IMPRESSION: No evidence of fracture or dislocation.   Original Report Authenticated By: Tonia Ghent, M.D.      MDM   1. Fall, initial encounter   2. Multiple contusions          Angus Seller, PA-C 09/09/13 [phone]

## 2013-09-09 NOTE — ED Notes (Signed)
Pt st's she slipped in water and fell earlier tonight.  Pt c/o pain in left hip, back and left shoulder.

## 2013-09-09 NOTE — ED Provider Notes (Signed)
Medical screening examination/treatment/procedure(s) were performed by non-physician practitioner and as supervising physician I was immediately available for consultation/collaboration.  Sunnie Nielsen, MD 09/09/13 [phone]

## 2013-09-19 ENCOUNTER — Encounter: Payer: Self-pay | Admitting: Family Medicine

## 2013-09-19 ENCOUNTER — Ambulatory Visit (INDEPENDENT_AMBULATORY_CARE_PROVIDER_SITE_OTHER): Payer: No Typology Code available for payment source | Admitting: Family Medicine

## 2013-09-19 ENCOUNTER — Ambulatory Visit (HOSPITAL_COMMUNITY)
Admission: RE | Admit: 2013-09-19 | Discharge: 2013-09-19 | Disposition: A | Discharge status: Home / Self Care | Payer: No Typology Code available for payment source | Source: Ambulatory Visit | Attending: Family Medicine | Admitting: Family Medicine

## 2013-09-19 VITALS — BP 137/84 | HR 77 | Ht 61.0 in | Wt 134.0 lb

## 2013-09-19 DIAGNOSIS — M25519 Pain in unspecified shoulder: Secondary | ICD-10-CM

## 2013-09-19 DIAGNOSIS — I10 Essential (primary) hypertension: Secondary | ICD-10-CM

## 2013-09-19 DIAGNOSIS — M542 Cervicalgia: Secondary | ICD-10-CM | POA: Insufficient documentation

## 2013-09-19 DIAGNOSIS — W19XXXA Unspecified fall, initial encounter: Secondary | ICD-10-CM | POA: Insufficient documentation

## 2013-09-19 DIAGNOSIS — G8929 Other chronic pain: Secondary | ICD-10-CM

## 2013-09-19 DIAGNOSIS — M25512 Pain in left shoulder: Secondary | ICD-10-CM | POA: Insufficient documentation

## 2013-09-19 DIAGNOSIS — F1120 Opioid dependence, uncomplicated: Secondary | ICD-10-CM

## 2013-09-19 DIAGNOSIS — M549 Dorsalgia, unspecified: Secondary | ICD-10-CM

## 2013-09-19 DIAGNOSIS — F1190 Opioid use, unspecified, uncomplicated: Secondary | ICD-10-CM

## 2013-09-19 MED ORDER — HYDROCODONE-ACETAMINOPHEN 10-325 MG PO TABS: 1.0000 | ORAL_TABLET | Freq: Four times a day (QID) | ORAL | Status: DC | PRN

## 2013-09-19 MED ORDER — HYDROCODONE-ACETAMINOPHEN 10-325 MG PO TABS: 1.0000 | ORAL_TABLET | Freq: Three times a day (TID) | ORAL | Status: DC | PRN

## 2013-09-19 NOTE — Assessment & Plan Note (Signed)
Patient given refill for Norco 10-325 mg #90x2; obtain urine drug screen

## 2013-09-19 NOTE — Patient Instructions (Signed)
Dear Ms. Greenbaum,   Thank you for coming to clinic today. Please read below regarding the issues that we discussed.   1. Neck Pain - I do not think he injured the bones of your neck however since you are so tender in that area I would like to get an x-ray to make sure. Please continue with your normal pain medication regimen.   2. Left shoulder pain - I think this is due to some inflammation and bursitis after the fall. I do not think you have a rotator cuff tear. We can continue to watch it and get studies in the future if needed.   3. High blood pressure - please continue on your triamterene- HCTZ  and propranolol  Please follow up in clinic in 2 months or sooner if needed. Please call earlier if you have any questions or concerns.   Sincerely,   Dr. [name] Sawyer

## 2013-09-19 NOTE — Assessment & Plan Note (Signed)
Assessment: well controlled Plan: continue triamterene HCTZ and propranolol

## 2013-09-19 NOTE — Assessment & Plan Note (Signed)
Assessment: shoulder with tenderness and decreased range of motion likely due to traumatic bursitis making full violation difficult at this time Plan: continue to monitor for improving symptoms and if patient still has persistent deficits in the next few months and I'll consider an MRI

## 2013-09-19 NOTE — Assessment & Plan Note (Signed)
Assessment: tenderness over spinous process C7 makes for possible fracture in that location however doubtful based on history Plan: will rule out injury to C7 process with x-ray

## 2013-09-19 NOTE — Progress Notes (Signed)
  Subjective:    Patient ID: Diana [name], female    DOB: 14-Apr-1956, 57 y.o.   MRN: [id number]  HPI  57 year old F presenting for evaluation of MSK pain.   Left Shoulder Pain/Neck Pain: the patient fell on a wet floor approximately 10 days ago and struck her left shoulder and left hip. At the time she went to the emergency room for dilation and was found to have contusions of those areas but no evidence of fracture or dislocations on x-rays of her left shoulder and left hip. She complains of soreness in the series today with her left shoulder pain more painful than her left hip. However she says overall it is improving. She does have pain radiating to her neck from her shoulder she believes is muscular in origin. She is continuing to use her regular regimen for chronic pain including Lidoderm patches, Norco when necessary, and diclofenac BID.   Hypertension  Office BP: BP Readings from Last 3 Encounters:  09/19/13 137/84  09/09/13 119/66  08/22/13 108/69    Prescribed meds: triameterene-HCTZ, propranolol  Hypertension ROS:  Taking medications as prescribed:Yes Chest pain: No Shortness of breath: No Swelling of extremities: No TIA symptoms: No   Review of Systems Right ankle pain resolved     Objective:   Physical Exam Gen: middle age WF, non ill appearing, pleasant and conversant  CV: RRR, no murmurs  Back: tenderness to palpation over spinous process of C7 Shoulder Exam - left Inspection: normal Palpation:   Clavicle:  Non tender, normal  AC Joint: Non tender, normal  Scapula: Non tender, normal             Biceps Tendon: Non tender ROM/Strength:  Abduction (Suprapsinatus): normal  Internal Rotation/Liftoff (Subsapularis):normal  External Rotation (Infraspinatus/Teres Minor): normal Maneuvers:   Neer's: limited  Empty Can: limited  Obrien's: limited   Lower extremities: no edema, right ankle tenderness much improved from previous examination      Assessment &  Plan:

## 2013-09-20 LAB — DRUG SCREEN, URINE
Benzodiazepines.: NEGATIVE
Cocaine Metabolites: NEGATIVE
Creatinine,U: 183.43 mg/dL
Phencyclidine (PCP): NEGATIVE
Propoxyphene: NEGATIVE

## 2013-11-15 ENCOUNTER — Encounter: Payer: Self-pay | Admitting: Family Medicine

## 2013-11-15 ENCOUNTER — Ambulatory Visit (INDEPENDENT_AMBULATORY_CARE_PROVIDER_SITE_OTHER): Payer: No Typology Code available for payment source | Admitting: Family Medicine

## 2013-11-15 VITALS — BP 153/88 | HR 76 | Temp 97.6°F | Ht 61.0 in | Wt 136.0 lb

## 2013-11-15 DIAGNOSIS — Z9181 History of falling: Secondary | ICD-10-CM

## 2013-11-15 DIAGNOSIS — G2581 Restless legs syndrome: Secondary | ICD-10-CM

## 2013-11-15 DIAGNOSIS — M549 Dorsalgia, unspecified: Secondary | ICD-10-CM

## 2013-11-15 DIAGNOSIS — G8929 Other chronic pain: Secondary | ICD-10-CM

## 2013-11-15 DIAGNOSIS — R296 Repeated falls: Secondary | ICD-10-CM | POA: Insufficient documentation

## 2013-11-15 MED ORDER — HYDROCODONE-ACETAMINOPHEN 10-325 MG PO TABS: 1.0000 | ORAL_TABLET | Freq: Three times a day (TID) | ORAL | Status: DC | PRN

## 2013-11-15 MED ORDER — HYDROCODONE-ACETAMINOPHEN 10-325 MG PO TABS: 1.0000 | ORAL_TABLET | Freq: Four times a day (QID) | ORAL | Status: DC | PRN

## 2013-11-15 MED ORDER — CLONAZEPAM 0.5 MG PO TABS: 0.5000 mg | ORAL_TABLET | Freq: Every evening | ORAL | Status: DC | PRN

## 2013-11-15 NOTE — Assessment & Plan Note (Signed)
A: likely due to pain and weakness, but also worried about polypharmacy P: - stop vistaril - only use clonazepam at night (already doing) - encouraged continued hip strengthening exercises on right - would like for patient to get eval by orthopedist for cont'd right groin pain, but pt does not want referral for financial reasons at this time

## 2013-11-15 NOTE — Assessment & Plan Note (Signed)
A: stable back pain P: Cont hydrocodone, lidocaine, voltaren, TENS unit, heating; refill for Norco 10-325 # 90 x 2 given today

## 2013-11-15 NOTE — Progress Notes (Signed)
Subjective:    Patient ID: Diana [name], female    DOB: [birth year], 57 y.o.   MRN: [id number]  HPI  57 year old F with chronic pain right hip and lower back pain who is s/p right hip replacement and lumbar spinal fusion who presents with frequent falls and numerous pain complaints.   Falls: She fell in August and sprained her ankle and fell in October and had a contusion of the left hip and shoulder. Today she notes that she had a fall this morning after her right leg failed to prop her up. She was using a cane but still could not stabilize herself. She struck her right side including elbow and hip. She denies hitting her head or LOC. She is taking numerous medications that could lead to falls including clonazepam, vistaril, and hydrocodone. She has been on all of these medications for several years.   Muscle Cramps/Pain in her legs: bilateral, "shin splints" on right side, "in bone", this occurred while at rest, it brought her to tears, this was distinct from typical pain, this was not preceded by any falls, it resolved slowly after 4-5 days for resolution, no pain today  Chronic Lower Back Pain: Location: central lower back and right hip  Source: lumbar back s/p fusion 1998 and right hip arthroplasty 1998  Current Treatment/Recent Course: Voltaren 75 mg q 2-3 days, Norco 10-325 1-2 pills per day during flare (last fill 09/19/13 # 90 with 3 refills), lidoderm 0.5 patches q 2 days, also gets most relief from lying down, getting into hot tub, TENS unit  Character 8/10  Analgesia 6/10 - lidoderm patch every other day 1/2 patch  Activity Increased: No - specifically  Aberant Behavior: No  Adverse Reaction: No  Recent Imaging: No - 7/13 stable lumbar spine and right hip arthroplast   Red flags: pt with recent falls and chronic weakness of RLE, no saddle anesthesia or bowel/bladder dysfunction   Patient Active Problem List   Diagnosis Date Noted  . Right ankle pain 07/26/2013    Priority:  Medium  . Irritable bowel syndrome (IBS) 06/07/2012    Priority: Medium  . Anxiety and depression 05/28/2012    Priority: Medium  . Hypertension, essential, benign 05/28/2012    Priority: Medium  . Tobacco use disorder, continuous 05/28/2012    Priority: Medium  . Hypercholesterolemia 05/28/2012    Priority: Medium  . Chronic back pain 05/20/2012    Priority: Medium  . Barrett's esophagus 05/20/2012    Priority: Medium  . Right knee pain 08/22/2013    Priority: Low  . Colon cancer screening 03/27/2013    Priority: Low  . Atrophic vaginitis 08/28/2012    Priority: Low  . Restless leg syndrome 05/28/2012    Priority: Low  . Insomnia 05/28/2012    Priority: Low  . Hiatal hernia 05/20/2012    Priority: Low  . Neck pain 09/19/2013  . Left shoulder pain 09/19/2013     Review of Systems     Objective:   Physical Exam BP 153/88  Pulse 76  Temp(Src) 97.6 F (36.4 C) (Oral)  Ht 5\' 1"  (1.549 m)  Wt 136 lb (61.689 kg)  BMI 25.71 kg/m2  Gen: middle aged WF, appears stated aged, non distressed Back:  Appearance: sciolosis no Palpation: tenderness of paraspinal muscles yes, spinous process no; pelvis no  Hip:  Right Rotation Reduced: internal no, external yes Neuro: Strength hip flexion 4/5, knee extension 5/5, knee flexion 5/5, dorsiflexion 5/5, plantar flexion 5/5  Reflexes: patella 2/2 Bilateral  Achilles 2/2 Bilateral Straight Leg Raise: negative Sensation to light touch intact: yes Legs: bilateral no tenderness of anterior tibia      Assessment & Plan:

## 2013-11-15 NOTE — Patient Instructions (Signed)
Dear Ms. Hofstra,   Thank you for coming to clinic today. Please read below regarding the issues that we discussed.   Falls - You are on several medications which increase your risk of falls. Please discontinue the vistaril. You may use the clonazepam but only at night. Please take the pain medication as needed. You are doing a great job with strengthening exercises, so please keep it up.   Please follow up in clinic in 2-3 months. Please call earlier if you have any questions or concerns.   Sincerely,   Dr. [name] Sawyer

## 2014-01-03 ENCOUNTER — Encounter: Payer: Self-pay | Admitting: Emergency Medicine

## 2014-01-03 ENCOUNTER — Ambulatory Visit (INDEPENDENT_AMBULATORY_CARE_PROVIDER_SITE_OTHER): Payer: No Typology Code available for payment source | Admitting: Emergency Medicine

## 2014-01-03 VITALS — BP 152/84 | HR 68 | Temp 98.4°F | Ht 61.0 in | Wt 136.0 lb

## 2014-01-03 DIAGNOSIS — L03019 Cellulitis of unspecified finger: Secondary | ICD-10-CM

## 2014-01-03 DIAGNOSIS — L02519 Cutaneous abscess of unspecified hand: Secondary | ICD-10-CM

## 2014-01-03 DIAGNOSIS — L03011 Cellulitis of right finger: Secondary | ICD-10-CM

## 2014-01-03 MED ORDER — CLINDAMYCIN HCL 300 MG PO CAPS: 300.0000 mg | ORAL_CAPSULE | Freq: Three times a day (TID) | ORAL | Status: DC

## 2014-01-03 NOTE — Assessment & Plan Note (Signed)
No signs of abscess. Given her allergies, will treat with Clindamycin 300mg  TID x10 days. Continue epsom salt soaks and neosporin. Return to clinic if not improving by Friday.

## 2014-01-03 NOTE — Patient Instructions (Signed)
It was nice to meet you!  I think you have an infection in your thumb. Keep doing the epsom salt soaks. You can continue the neosporin as well. DO NOT use the liquid bandaid.  Start Clindamycin 300mg  3 times a day for 10 days.  If it is not starting to improve by Friday, please come back and see [place].

## 2014-01-03 NOTE — Progress Notes (Signed)
Subjective:    Patient ID: Diana [name], female    DOB: May 27, 1956, 58 y.o.   MRN: [id number]  HPI Diana [name] is here for a SDA for right thumb pain and swelling.  She reports that she cut the top of her thumb at the corner of the nail about 1 week ago.  Since then, it has been swollen and very painful.  Worse with bending the thumb or palpation.  She denies any red streaks or fevers.  No drainage from the cut.  She has been doing epsom salt soaks, neosporin and liquid bandaid.  It has slowly been feeling worse.  Current Outpatient Prescriptions on File Prior to Visit  Medication Sig Dispense Refill  . albuterol (PROVENTIL HFA;VENTOLIN HFA) 108 (90 BASE) MCG/ACT inhaler Inhale 3 puffs into the lungs every 6 (six) hours as needed for wheezing.      . butalbital-acetaminophen-caffeine (FIORICET, ESGIC) 50-325-40 MG per tablet Take 1 tablet by mouth daily as needed. For migraines  30 tablet  1  . CALCIUM-MAGNESIUM-ZINC PO Take 1 tablet by mouth daily.       . cimetidine (TAGAMET) 800 MG tablet Take 1 tablet (800 mg total) by mouth 3 (three) times daily as needed. For acid reflux  90 tablet  3  . clonazePAM (KLONOPIN) 0.5 MG tablet Take 1 tablet (0.5 mg total) by mouth at bedtime as needed.  30 tablet  2  . Coenzyme Q10 (CO Q-10 PO) Take 1 capsule by mouth daily.       . diclofenac (VOLTAREN) 75 MG EC tablet Take 1 tablet (75 mg total) by mouth 2 (two) times daily as needed.  60 tablet  2  . Ergocalciferol (VITAMIN D2) 2000 UNITS TABS Take 2,000 Units by mouth daily.      Marland Kitchen HYDROcodone-acetaminophen (NORCO) 10-325 MG per tablet Take 1 tablet by mouth every 8 (eight) hours as needed.  90 tablet  0  . HYDROcodone-acetaminophen (NORCO) 10-325 MG per tablet Take 1 tablet by mouth every 6 (six) hours as needed.  90 tablet  0  . hydrOXYzine (VISTARIL) 25 MG capsule Take 1 capsule (25 mg total) by mouth at bedtime as needed for itching.  60 capsule  2  . lidocaine (LIDODERM) 5 % Place 1 patch onto the  skin daily. Remove & Discard patch within 12 hours or as directed by MD      . Multiple Vitamin (MULITIVITAMIN WITH MINERALS) TABS Take 1 tablet by mouth daily.      Marland Kitchen omeprazole (PRILOSEC) 40 MG capsule Take 1 capsule (40 mg total) by mouth 2 (two) times daily.  180 capsule  3  . ondansetron (ZOFRAN ODT) 8 MG disintegrating tablet Take 1 tablet (8 mg total) by mouth every 8 (eight) hours as needed for nausea.  20 tablet  0  . Probiotic Product (PROBIOTIC DAILY PO) Take 2 tablets by mouth daily.      . propranolol (INDERAL) 20 MG tablet Take 1 tablet (20 mg total) by mouth 2 (two) times daily.  60 tablet  11  . simvastatin (ZOCOR) 40 MG tablet Take 1 tablet (40 mg total) by mouth at bedtime.  30 tablet  11  . triamterene-hydrochlorothiazide (MAXZIDE-25) 37.5-25 MG per tablet Take 1 tablet by mouth daily.  90 tablet  3  . TURMERIC PO Take 1 tablet by mouth 2 (two) times daily.       Marland Kitchen zolpidem (AMBIEN) 10 MG tablet Take 1 tablet (10 mg total) by mouth at  bedtime as needed.  30 tablet  5   No current facility-administered medications on file prior to visit.    I have reviewed and updated the following as appropriate: allergies and current medications SHx: current smoker  Review of Systems See HPI    Objective:   Physical Exam BP 152/84  Pulse 68  Temp(Src) 98.4 F (36.9 C) (Oral)  Ht 5\' 1"  (1.549 m)  Wt 136 lb (61.689 kg)  BMI 25.71 kg/m2 Gen: alert, cooperative, NAD Right thumb: cut on top of thumb at top of medial nail fold; surrounding mild swelling; very tender; no frank erythema; no drainage; has full range of motion but with pain; no fluctuance     Assessment & Plan:

## 2014-01-23 ENCOUNTER — Encounter: Payer: Self-pay | Admitting: Family Medicine

## 2014-01-23 ENCOUNTER — Ambulatory Visit (INDEPENDENT_AMBULATORY_CARE_PROVIDER_SITE_OTHER): Payer: Medicaid Other | Admitting: Family Medicine

## 2014-01-23 VITALS — BP 164/95 | HR 93 | Temp 98.3°F | Ht 61.0 in | Wt 134.0 lb

## 2014-01-23 DIAGNOSIS — R112 Nausea with vomiting, unspecified: Secondary | ICD-10-CM

## 2014-01-23 DIAGNOSIS — M545 Low back pain, unspecified: Secondary | ICD-10-CM

## 2014-01-23 DIAGNOSIS — M25512 Pain in left shoulder: Secondary | ICD-10-CM

## 2014-01-23 DIAGNOSIS — G8929 Other chronic pain: Secondary | ICD-10-CM

## 2014-01-23 DIAGNOSIS — M25519 Pain in unspecified shoulder: Secondary | ICD-10-CM

## 2014-01-23 MED ORDER — LIDOCAINE 5 % EX PTCH: 1.0000 | MEDICATED_PATCH | CUTANEOUS | Status: DC

## 2014-01-23 MED ORDER — PROMETHAZINE HCL 12.5 MG PO TABS: 12.5000 mg | ORAL_TABLET | Freq: Four times a day (QID) | ORAL | Status: DC | PRN

## 2014-01-23 NOTE — Progress Notes (Signed)
Subjective:    Patient ID: Diana [name], female    DOB: Nov 23, 1956, 58 y.o.   MRN: [id number]  HPI  58 year old F with HTN, Chronic lower back pain, and IBS who presents for follow up.   Nausea/Vomiting Duration: 4 days, comes in "waves", 4 vomitus in 24 hours  Alleviating: none Exacerbating: unknown  PO intake: only taking in ginger ale in that time, has not been able to take in BP meds Associated Symptoms: diarrhea has resolved,no sick contacts, + chills, no fever, no abdominal distension or pain    Left Shoulder Pain:  Location: posterior Character: 7/10, stabbing Course: worsening Duration: pt says chronic due to a fall while working years ago, although when I evaluated this same pain in October 2014, she stated that it was due a recent fall Alleviating: nothing, previously her lidoderm patches for chronic back pain helped Exacerbating: reaching and lifting with the left hand  Previous Evaluation: Pt states that Dr. 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 She cannot recall the test, and there are no documents to clarify. She also does not know what caused the nerve damage.  Recent Imaging:  > X ray left shoulder Oct 2014: no fracture or arthritis > X-ray cervical spine Oct 2014: no fracture or arthritis     Current Outpatient Prescriptions on File Prior to Visit  Medication Sig Dispense Refill  . albuterol (PROVENTIL HFA;VENTOLIN HFA) 108 (90 BASE) MCG/ACT inhaler Inhale 3 puffs into the lungs every 6 (six) hours as needed for wheezing.      . butalbital-acetaminophen-caffeine (FIORICET, ESGIC) 50-325-40 MG per tablet Take 1 tablet by mouth daily as needed. For migraines  30 tablet  1  . CALCIUM-MAGNESIUM-ZINC PO Take 1 tablet by mouth daily.       . cimetidine (TAGAMET) 800 MG tablet Take 1 tablet (800 mg total) by mouth 3 (three) times daily as  needed. For acid reflux  90 tablet  3  . clindamycin (CLEOCIN) 300 MG capsule Take 1 capsule (300 mg total) by mouth 3 (three) times daily.  30 capsule  0  . clonazePAM (KLONOPIN) 0.5 MG tablet Take 1 tablet (0.5 mg total) by mouth at bedtime as needed.  30 tablet  2  . Coenzyme Q10 (CO Q-10 PO) Take 1 capsule by mouth daily.       . diclofenac (VOLTAREN) 75 MG EC tablet Take 1 tablet (75 mg total) by mouth 2 (two) times daily as needed.  60 tablet  2  . Ergocalciferol (VITAMIN D2) 2000 UNITS TABS Take 2,000 Units by mouth daily.      Marland Kitchen HYDROcodone-acetaminophen (NORCO) 10-325 MG per tablet Take 1 tablet by mouth every 8 (eight) hours as needed.  90 tablet  0  . HYDROcodone-acetaminophen (NORCO) 10-325 MG per tablet Take 1 tablet by mouth every 6 (six) hours as needed.  90 tablet  0  . hydrOXYzine (VISTARIL) 25 MG capsule Take 1 capsule (25 mg total) by mouth at bedtime as needed for itching.  60 capsule  2  . lidocaine (LIDODERM) 5 % Place 1 patch onto the skin daily. Remove & Discard patch within 12 hours or as directed by MD      . Multiple Vitamin (MULITIVITAMIN WITH MINERALS) TABS Take 1 tablet by mouth daily.      Marland Kitchen omeprazole (PRILOSEC) 40 MG capsule Take  1 capsule (40 mg total) by mouth 2 (two) times daily.  180 capsule  3  . ondansetron (ZOFRAN ODT) 8 MG disintegrating tablet Take 1 tablet (8 mg total) by mouth every 8 (eight) hours as needed for nausea.  20 tablet  0  . Probiotic Product (PROBIOTIC DAILY PO) Take 2 tablets by mouth daily.      . propranolol (INDERAL) 20 MG tablet Take 1 tablet (20 mg total) by mouth 2 (two) times daily.  60 tablet  11  . simvastatin (ZOCOR) 40 MG tablet Take 1 tablet (40 mg total) by mouth at bedtime.  30 tablet  11  . triamterene-hydrochlorothiazide (MAXZIDE-25) 37.5-25 MG per tablet Take 1 tablet by mouth daily.  90 tablet  3  . TURMERIC PO Take 1 tablet by mouth 2 (two) times daily.       Marland Kitchen zolpidem (AMBIEN) 10 MG tablet Take 1 tablet (10 mg total) by  mouth at bedtime as needed.  30 tablet  5   No current facility-administered medications on file prior to visit.   '  Review of Systems Negative for weakness of LUE; no vertigo; otherwise see HPI    Objective:   Physical Exam BP 164/95  Pulse 93  Temp(Src) 98.3 F (36.8 C) (Oral)  Ht 5\' 1"  (1.549 m)  Wt 134 lb (60.782 kg)  BMI 25.33 kg/m2 Gen: middle age WF, older appearing than stated age, non ill appearing and non distressed CV: RRR, no murmurs Pulm: CTA-B Abdomen: soft, NDNDT, no masses, no guarding, normal bowel sounds in all quadrants  Neck: normal ROM; equivocal Spurling's test bilaterally, marked tenderness to left trapezius and cervical paraspinal muscles  Shoulder Exam - left Inspection: normal Palpation:   Clavicle:  normal  AC Joint: normal  Scapula: tenderness around scapula but no swelling, winging, or deformity ROM/Strength:  Abduction (Suprapsinatus): normal  Internal Rotation/Liftoff (Subsapularis):normal  External Rotation (Infraspinatus/Teres Minor): normal Maneuvers:   Neer's: Negative  Hawkin's: Negative  Empty Can/Jobes for supraspinatus: Negative Obrien's test for labrum tear: Negative   Left upper extremities: 5/5 strength, no muscle atrophy       Assessment & Plan:

## 2014-01-23 NOTE — Patient Instructions (Addendum)
Ms. Melchior,   It was great to see you. Sorry about the pain and nausea.   Pain - For the shoulder pain, I would like for the sports medicine doctor to take a look. Please call to set up appointment.   Nausea - Use the phenergan as needed.   Follow up in 1 week for eval of nausea and blood pressure.   Sincerely,   Dr. Maricela Bo

## 2014-01-24 ENCOUNTER — Other Ambulatory Visit: Payer: Self-pay | Admitting: *Deleted

## 2014-01-24 DIAGNOSIS — R112 Nausea with vomiting, unspecified: Secondary | ICD-10-CM | POA: Insufficient documentation

## 2014-01-24 DIAGNOSIS — M545 Low back pain, unspecified: Secondary | ICD-10-CM

## 2014-01-24 DIAGNOSIS — G8929 Other chronic pain: Secondary | ICD-10-CM

## 2014-01-24 DIAGNOSIS — M25512 Pain in left shoulder: Secondary | ICD-10-CM | POA: Insufficient documentation

## 2014-01-24 MED ORDER — LIDOCAINE 5 % EX PTCH: 1.0000 | MEDICATED_PATCH | CUTANEOUS | Status: DC

## 2014-01-24 NOTE — Assessment & Plan Note (Addendum)
A: pt stable without acute abdomen, but rather a completely normal abdominal exam and well hydrated so likely cause could be viral gastro but most likely to be self-limited P: phenergan for support, otherwise nothing needed

## 2014-01-24 NOTE — Assessment & Plan Note (Signed)
A: chronic left posterior shoulder pain with reported nerve damage of unknown etiology without reports in a patient with chronic pain of the lower back makes for a difficult assessment; nonetheless, posterior shoulder pain is always concerning for cervical spine etiology, but with a completely normal cervical spine X-ray 4 months ago, this is less likely; Scapular dyskinesis or dysfunction is probably my leading diagnosis but difficult to confirm at this point P:  - Continue lidoderm patches - Refer to sports medicine for a second opinion

## 2014-01-27 ENCOUNTER — Other Ambulatory Visit: Payer: Self-pay | Admitting: *Deleted

## 2014-01-30 ENCOUNTER — Telehealth: Payer: Self-pay | Admitting: *Deleted

## 2014-01-30 MED ORDER — FAMOTIDINE 40 MG PO TABS: 40.0000 mg | ORAL_TABLET | Freq: Every day | ORAL | Status: DC

## 2014-01-30 MED ORDER — DICLOFENAC SODIUM 1 % TD GEL: 2.0000 g | Freq: Four times a day (QID) | TRANSDERMAL | Status: DC | PRN

## 2014-01-30 MED ORDER — ZOLPIDEM TARTRATE 10 MG PO TABS: 10.0000 mg | ORAL_TABLET | Freq: Every evening | ORAL | Status: DC | PRN

## 2014-01-30 MED ORDER — PROPRANOLOL HCL 20 MG PO TABS: 20.0000 mg | ORAL_TABLET | Freq: Two times a day (BID) | ORAL | Status: DC

## 2014-01-30 NOTE — Telephone Encounter (Signed)
Medication changes were made. The patient will need to pick up her prescription for ambien. Please let her know that is is ready.

## 2014-01-30 NOTE — Telephone Encounter (Signed)
Left message for pt regarding medication changes and Rx for Lorrin Mais is ready for pick up. See message below from provider.  Derl Barrow, RN

## 2014-01-30 NOTE — Telephone Encounter (Signed)
Medicaid will only cover famotidine or Ranitidine can this be changed from Cimetidine 800 mg tab.  Medicaid will not pay for lidocaine (lidoderm) 5% patch, they will pay for Voltaren gel can this be changed? [facility] also sent in a new Rx request for Zolpidem 10 mg tab.  In provider box for review.  Derl Barrow, RN

## 2014-02-01 ENCOUNTER — Telehealth: Payer: Self-pay | Admitting: *Deleted

## 2014-02-01 ENCOUNTER — Telehealth: Payer: Self-pay | Admitting: Family Medicine

## 2014-02-01 NOTE — Telephone Encounter (Signed)
Prior Authorization received from [facility] for Zolpidem 10 mg tab.  Formulary and PA form placed in provider box for completion. Derl Barrow, RN

## 2014-02-01 NOTE — Telephone Encounter (Signed)
Please let patient know that a letter was completed and that for any letters in the future, she will need an appointment to have it written.

## 2014-02-01 NOTE — Telephone Encounter (Signed)
Pt informed that letter is complete, ready for pick up and will be faxed to Dr. Leonarda Salon office [phone].  Pt also informed she will need to schedule appt with provider in the future for any letters needed.   Please see Provider note below.  Derl Barrow, RN

## 2014-02-01 NOTE — Telephone Encounter (Signed)
Will place form from dentist in MD box for review.  Barby Colvard, Loralyn [place], [place]

## 2014-02-01 NOTE — Telephone Encounter (Signed)
Pt came by stating that dentist needed to know what types of antibiotics to put [facility] on and Dr. Maricela Bo recommendation of taking out rest of the bottom teeth.  Marcus has left a letter from her dentist that pt states needs to be faxed to [phone] Keri states that she need it today.

## 2014-02-02 ENCOUNTER — Encounter: Payer: Self-pay | Admitting: Sports Medicine

## 2014-02-02 ENCOUNTER — Encounter: Payer: Self-pay | Admitting: Family Medicine

## 2014-02-02 ENCOUNTER — Ambulatory Visit (INDEPENDENT_AMBULATORY_CARE_PROVIDER_SITE_OTHER): Payer: Medicaid Other | Admitting: Sports Medicine

## 2014-02-02 ENCOUNTER — Ambulatory Visit (INDEPENDENT_AMBULATORY_CARE_PROVIDER_SITE_OTHER): Payer: Medicaid Other | Admitting: Family Medicine

## 2014-02-02 VITALS — BP 124/84 | HR 87 | Ht 61.0 in | Wt 129.0 lb

## 2014-02-02 VITALS — BP 143/78 | HR 86 | Temp 98.3°F | Ht 61.0 in | Wt 129.5 lb

## 2014-02-02 DIAGNOSIS — R110 Nausea: Secondary | ICD-10-CM

## 2014-02-02 DIAGNOSIS — J449 Chronic obstructive pulmonary disease, unspecified: Secondary | ICD-10-CM

## 2014-02-02 DIAGNOSIS — I10 Essential (primary) hypertension: Secondary | ICD-10-CM

## 2014-02-02 DIAGNOSIS — G4700 Insomnia, unspecified: Secondary | ICD-10-CM

## 2014-02-02 DIAGNOSIS — M25519 Pain in unspecified shoulder: Secondary | ICD-10-CM

## 2014-02-02 DIAGNOSIS — M542 Cervicalgia: Secondary | ICD-10-CM

## 2014-02-02 DIAGNOSIS — K2270 Barrett's esophagus without dysplasia: Secondary | ICD-10-CM

## 2014-02-02 MED ORDER — ZOLPIDEM TARTRATE 10 MG PO TABS: 10.0000 mg | ORAL_TABLET | Freq: Every evening | ORAL | Status: DC | PRN

## 2014-02-02 MED ORDER — LIDOCAINE 5 % EX PTCH: 1.0000 | MEDICATED_PATCH | CUTANEOUS | Status: DC

## 2014-02-02 MED ORDER — ALBUTEROL SULFATE HFA 108 (90 BASE) MCG/ACT IN AERS: 2.0000 | INHALATION_SPRAY | Freq: Four times a day (QID) | RESPIRATORY_TRACT | Status: DC | PRN

## 2014-02-02 MED ORDER — ONDANSETRON HCL 4 MG/2ML IJ SOLN
4.0000 mg | Freq: Once | INTRAMUSCULAR | Status: AC
  Administered 2014-02-02: 4 mg via INTRAMUSCULAR

## 2014-02-02 NOTE — Patient Instructions (Signed)
You have been scheduled for a MRI of your neck on 02/08/14 at 7 pm [facility] [place]  [phone]  Please scheduled at follow up appt with Dr. Micheline Chapman for 02/13/14  Thank you for seeing [place] today!

## 2014-02-02 NOTE — Progress Notes (Signed)
   Subjective:    Patient ID: Diana [name], female    DOB: [birth year], 58 y.o.   MRN: [id number]  HPI  58 year old F with recent episodes of nausea and vomiting and elevated BP who presents for evaluation.   Nausea and Vomiting Seen for this issues on 01/23/14, pt thought to have be viral gastro vs effects of previously taking clindamycin; pt prescribed phenergan and expected to improve; pt notes that she is improving with no vomiting but still with intermittent nausea; pt is able to swallow and keep down pills; denies dysphagia or odynophagia but does have a long history of barrett's esophagus that has not been evaluated for > 10 years; pt denies fever or diarrhea  Hypertension  Office BP: BP Readings from Last 3 Encounters:  02/02/14 143/78  01/23/14 164/95  01/03/14 152/84    Prescribed meds: propranolol 20 mg BID, triamterene HCTZ 37.5-25  Hypertension ROS:  Taking medications as prescribed:Yes Chest pain: No Shortness of breath: No Swelling of extremities: No TIA symptoms: No Regular exercise: No Low Na+ diet: No Alcohol/tobacco/drug use: Yes - pt with persistent tobacco use    Review of Systems See HPI    Objective:   Physical Exam BP 143/78  Pulse 86  Temp(Src) 98.3 F (36.8 C) (Oral)  Ht 5\' 1"  (1.549 m)  Wt 129 lb 8 oz (58.741 kg)  BMI 24.48 kg/m2  Gen: middle age WF, older appearing than stated age, non ill appearing and non distressed  CV: RRR, no murmurs  Pulm: CTA-B  Abdomen: soft, NDNDT, no masses, no guarding, normal bowel sounds in all quadrants        Assessment & Plan:

## 2014-02-02 NOTE — Telephone Encounter (Signed)
Order modified for medicaid. Prescription given to patient at office visit.

## 2014-02-02 NOTE — Patient Instructions (Signed)
Ms. Botkins,   It was nice to see you. We are going to do the following:   1. Referral to Gastroenterology for evaluation of barrett's esophagus and routine colonoscopy for colon cancer screening  2. Refill albuterol and ambien; follow up about cholesterol medication  Let me know if I can do anything else. Come back in 4 weeks or sooner if needed.   Sincerely,   Dr. Maricela Bo

## 2014-02-02 NOTE — Progress Notes (Signed)
   Subjective:    Patient ID: Diana [name], female    DOB: 1956-09-24, 58 y.o.   MRN: [id number]  HPI chief complaint: Left shoulder pain  58 year old right-hand-dominant female comes in today for evaluation of left shoulder pain. She has a history of a fall 5 years ago which left her with "nerve damage". This was initially cared for by Dr. Jenny Reichmann. Since that injury, she has had a burning and stinging discomfort that she localizes to the periscapular area. For the most part, over the past few years her symptoms have been tolerable but a few months ago her pain began to worsen. Although it is constantly present, it is aggravated by certain shoulder motions such as reaching away from her body. She denies pain elsewhere in the shoulder. She denies numbness or tingling down the left arm but she does experience intermittent neck pain. No prior neck or left shoulder surgeries. Over the years, she has been treated with cortisone injections, anti-inflammatories, gabapentin, and Lidoderm patches. Cortisone injections were not very effective and she was intolerant of the gabapentin. Lidoderm patches have been the only thing that seems to help but unfortunately her insurance is not currently covering them. She denies any loss of range of motion in the shoulder. She denies any weakness. Past medical history is reviewed Current medications review Allergies reviewed    Review of Systems As above    Objective:   Physical Exam Well-developed, well-nourished. No acute distress. Awake alert and oriented x3. Vital signs are reviewed  Left shoulder: Patient demonstrates full range of motion. No scapular winging. She is tender to palpation along the left paraspinal musculature and left parascapular area. No spasm. No soft tissue swelling. Negative Spurling's. Rotator cuff strength is 5/5. Negative empty can, negative Hawkins. Negative O'Briens.  Neurological exam: Strength is 5/5 both upper extremities. Reflexes are  1/4 at the biceps, triceps, and brachial radialis tendons. No atrophy. Sensation is intact to light touch grossly.  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 I would also like to get an MRI of her cervical spine to rule out referred pain although this would be unusual due to the fact that she has no true radiculopathy down the left arm. Phone followup with her following the MRI to discuss those results and delineate further treatment.

## 2014-02-05 NOTE — Assessment & Plan Note (Addendum)
A: pt with history of barrett's esophagus without proper surveillance in > 10 years and pt with continued tobacco abuse which increases risk for dysplasia, could also be contributing to nauase P: referral to GI for mgt/eval of barrett's esophagus with continuation of omeprazole and famotidine

## 2014-02-05 NOTE — Assessment & Plan Note (Signed)
A: stable P: cont propranolol 20 mg BID, triamterene-hctz 37.5-25

## 2014-02-08 ENCOUNTER — Encounter: Payer: Self-pay | Admitting: Gastroenterology

## 2014-02-08 ENCOUNTER — Ambulatory Visit
Admission: RE | Admit: 2014-02-08 | Discharge: 2014-02-08 | Disposition: A | Discharge status: Home / Self Care | Payer: Medicaid Other | Source: Ambulatory Visit | Attending: Sports Medicine | Admitting: Sports Medicine

## 2014-02-08 DIAGNOSIS — M542 Cervicalgia: Secondary | ICD-10-CM

## 2014-02-13 ENCOUNTER — Encounter: Payer: Self-pay | Admitting: Sports Medicine

## 2014-02-13 ENCOUNTER — Ambulatory Visit (INDEPENDENT_AMBULATORY_CARE_PROVIDER_SITE_OTHER): Payer: Medicaid Other | Admitting: Sports Medicine

## 2014-02-13 VITALS — BP 126/80 | HR 84 | Ht 61.0 in | Wt 129.0 lb

## 2014-02-13 DIAGNOSIS — M25519 Pain in unspecified shoulder: Secondary | ICD-10-CM

## 2014-02-13 MED ORDER — METHYLPREDNISOLONE ACETATE 80 MG/ML IJ SUSP
80.0000 mg | Freq: Once | INTRAMUSCULAR | Status: AC
  Administered 2014-02-13: 80 mg via INTRAMUSCULAR

## 2014-02-13 NOTE — Progress Notes (Signed)
Patient ID: Diana [name], female   DOB: [birth year], 58 y.o.   MRN: [id number]  Patient comes in today to discuss MRI findings of her cervical spine. Only finding is a small shallow disc bulge at C5-C6 and C6-C7 without any significant stenosis. I have filed an appeal with her insurance company in hopes of getting her Lidoderm patches approved. These really have helped her in the past. She has also had good success with cortisone injections in the past. Dr. Jenny Reichmann used to administer IM injections as needed and along with the Lidoderm patches this seemed to improve her symptoms tremendously. Therefore, we will inject her with 80 mg of Depo-Medrol IM today. I would be happy to repeat this injection up to 3 times a year as needed. I've also asked her to contact [employer] to see if her Lidoderm patches have been approved. I reassured her that the MRI scan of her cervical spine shows no operative pathology. She will followup when necessary.

## 2014-03-02 ENCOUNTER — Other Ambulatory Visit: Payer: Self-pay | Admitting: *Deleted

## 2014-03-02 ENCOUNTER — Ambulatory Visit (INDEPENDENT_AMBULATORY_CARE_PROVIDER_SITE_OTHER): Payer: Medicaid Other | Admitting: Family Medicine

## 2014-03-02 ENCOUNTER — Encounter: Payer: Self-pay | Admitting: Family Medicine

## 2014-03-02 VITALS — BP 146/87 | HR 76 | Ht 61.0 in | Wt 127.0 lb

## 2014-03-02 DIAGNOSIS — M25512 Pain in left shoulder: Secondary | ICD-10-CM

## 2014-03-02 DIAGNOSIS — M549 Dorsalgia, unspecified: Secondary | ICD-10-CM

## 2014-03-02 DIAGNOSIS — M25519 Pain in unspecified shoulder: Secondary | ICD-10-CM

## 2014-03-02 DIAGNOSIS — G2581 Restless legs syndrome: Secondary | ICD-10-CM

## 2014-03-02 DIAGNOSIS — E785 Hyperlipidemia, unspecified: Secondary | ICD-10-CM

## 2014-03-02 DIAGNOSIS — G8929 Other chronic pain: Secondary | ICD-10-CM

## 2014-03-02 MED ORDER — HYDROCODONE-ACETAMINOPHEN 10-325 MG PO TABS: 1.0000 | ORAL_TABLET | Freq: Four times a day (QID) | ORAL | Status: DC | PRN

## 2014-03-02 MED ORDER — CLONAZEPAM 0.5 MG PO TABS: 0.5000 mg | ORAL_TABLET | Freq: Two times a day (BID) | ORAL | Status: DC | PRN

## 2014-03-02 MED ORDER — DICLOFENAC SODIUM 75 MG PO TBEC: 75.0000 mg | DELAYED_RELEASE_TABLET | Freq: Two times a day (BID) | ORAL | Status: DC | PRN

## 2014-03-02 MED ORDER — SIMVASTATIN 40 MG PO TABS: 40.0000 mg | ORAL_TABLET | Freq: Every day | ORAL | Status: DC

## 2014-03-02 MED ORDER — HYDROCODONE-ACETAMINOPHEN 10-325 MG PO TABS: 1.0000 | ORAL_TABLET | Freq: Three times a day (TID) | ORAL | Status: DC | PRN

## 2014-03-02 MED ORDER — LIDOCAINE 5 % EX PTCH: 1.0000 | MEDICATED_PATCH | CUTANEOUS | Status: DC

## 2014-03-02 NOTE — Progress Notes (Signed)
Subjective:    Patient ID: Diana [name], female    DOB: Jun 15, 1956, 58 y.o.   MRN: [id number]  HPI  Chronic Lower Back Pain:  Location: central lower back and right hip  Source: lumbar back s/p fusion 1998 and right hip arthroplasty 1998  Current Treatment/Recent Course: Voltaren 75 mg q 2-3 days, Norco 10-325 1-2 pills per day during flare (last fill 112/16/14 # 90 with 1 refills), lidoderm patches not approved by insurance but previously used those until she got medicaid which wouldn't cover cost; Dr. Micheline Chapman is trying to get approval from [facility] for this , also gets most relief from lying down, getting into hot tub, TENS unit  Character 8/10  Analgesia 5/10 Activity Increased: No - specifically  Aberant Behavior: No  Adverse Reaction: No  Recent Imaging: 7/13 stable lumbar spine and right hip arthroplast Red flags: no saddle anesthesia or bowel/bladder dysfunction  Posterior Shoulder Pain: The patient is being followed by Dr. Micheline Chapman at sports medicine for this issue. 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 Dr. Micheline Chapman recommended using Lidoderm patches and gave her an injection of steroids. He is seeking approval of the use of lidoderm patches. Pt notes that she uses the TENS unit and gets relief in this regard. Currently, she notes that using clonazepam relaxes the muscles in her shoulder and greatly improves the pain.  Current Outpatient Prescriptions on File Prior to Visit  Medication Sig Dispense Refill  . albuterol (PROVENTIL HFA;VENTOLIN HFA) 108 (90 BASE) MCG/ACT inhaler Inhale 2 puffs into the lungs every 6 (six) hours as needed for wheezing.  8.5 g  5  . butalbital-acetaminophen-caffeine (FIORICET, ESGIC) 50-325-40 MG per tablet Take 1 tablet by mouth daily as needed. For migraines  30 tablet  1  . CALCIUM-MAGNESIUM-ZINC PO Take 1 tablet by mouth daily.       . clindamycin (CLEOCIN) 300 MG capsule Take 1 capsule (300 mg total) by mouth 3 (three) times daily.   30 capsule  0  . clonazePAM (KLONOPIN) 0.5 MG tablet Take 1 tablet (0.5 mg total) by mouth at bedtime as needed.  30 tablet  2  . Coenzyme Q10 (CO Q-10 PO) Take 1 capsule by mouth daily.       . diclofenac (VOLTAREN) 75 MG EC tablet Take 1 tablet (75 mg total) by mouth 2 (two) times daily as needed.  60 tablet  2  . diclofenac sodium (VOLTAREN) 1 % GEL Apply 2 g topically 4 (four) times daily as needed.  100 g  5  . Ergocalciferol (VITAMIN D2) 2000 UNITS TABS Take 2,000 Units by mouth daily.      . famotidine (PEPCID) 40 MG tablet Take 1 tablet (40 mg total) by mouth daily.  30 tablet  11  . HYDROcodone-acetaminophen (NORCO) 10-325 MG per tablet Take 1 tablet by mouth every 8 (eight) hours as needed.  90 tablet  0  . HYDROcodone-acetaminophen (NORCO) 10-325 MG per tablet Take 1 tablet by mouth every 6 (six) hours as needed.  90 tablet  0  . hydrOXYzine (VISTARIL) 25 MG capsule Take 1 capsule (25 mg total) by mouth at bedtime as needed for itching.  60 capsule  2  . lidocaine (LIDODERM) 5 % Place 1 patch onto the skin daily. Remove & Discard patch within 12 hours or as directed by MD  30 patch  0  . Multiple Vitamin (MULITIVITAMIN WITH MINERALS) TABS Take 1 tablet by mouth daily.      Marland Kitchen  omeprazole (PRILOSEC) 40 MG capsule Take 1 capsule (40 mg total) by mouth 2 (two) times daily.  180 capsule  3  . ondansetron (ZOFRAN ODT) 8 MG disintegrating tablet Take 1 tablet (8 mg total) by mouth every 8 (eight) hours as needed for nausea.  20 tablet  0  . Probiotic Product (PROBIOTIC DAILY PO) Take 2 tablets by mouth daily.      . promethazine (PHENERGAN) 12.5 MG tablet Take 1 tablet (12.5 mg total) by mouth every 6 (six) hours as needed for nausea or vomiting.  30 tablet  0  . propranolol (INDERAL) 20 MG tablet Take 1 tablet (20 mg total) by mouth 2 (two) times daily.  60 tablet  11  . simvastatin (ZOCOR) 40 MG tablet Take 1 tablet (40 mg total) by mouth at bedtime.  30 tablet  11  .  triamterene-hydrochlorothiazide (MAXZIDE-25) 37.5-25 MG per tablet Take 1 tablet by mouth daily.  90 tablet  3  . TURMERIC PO Take 1 tablet by mouth 2 (two) times daily.       Marland Kitchen zolpidem (AMBIEN) 10 MG tablet Take 1 tablet (10 mg total) by mouth at bedtime as needed.  15 tablet  5   No current facility-administered medications on file prior to visit.     Review of Systems Negative unless stated in HPI    Objective:   Physical Exam BP 146/87  Pulse 76  Ht 5\' 1"  (1.549 m)  Wt 127 lb (57.607 kg)  BMI 24.01 kg/m2  Gen: middle age WF, older appearing than stated age, non ill appearing and non distressed  Back:  Appearance: sciolosis no Palpation: tenderness of paraspinal muscles no, spinous process no; pelvis no   Neuro: Strength hip flexion 5/5, hip abduction 5/5, hip adduction 5/5,  knee extension 5/5, knee flexion 5/5, dorsiflexion 5/5, plantar flexion 5/5 bilaterally (much improved strength of right hip flexion on right side compared to 11/15/14) Sensation to light touch intact: yes       Assessment & Plan:

## 2014-03-02 NOTE — Patient Instructions (Signed)
Ms. Hemann,   It was nice to see you. Continue with you current regimen for pain treatment. You can use clonazepam up to twice a day as needed. However, be very cautious when taking this with hydrocodone. Also, do not take this with ambien and do not drive while taking these medications.   Please pick up your simvastatin at the pharmacy.   I will see you in 2 months.   Dr. Maricela Bo

## 2014-03-02 NOTE — Assessment & Plan Note (Signed)
A: stable pain with improving strength P: cont TENS, exercises, refill hydrocodone 10-325 # 90, 1 refill; refill voltaren

## 2014-03-02 NOTE — Assessment & Plan Note (Signed)
A: persistent pain, mild disc protrusion C5-6, C6-7 which may not account for all of pain P: given refill of pain med and clonazepam; will ideally go back down to once daily PRN clonazepam if lidoderm patches approved given increased fall risk in patient with hx of falls

## 2014-03-08 ENCOUNTER — Other Ambulatory Visit: Payer: Self-pay | Admitting: *Deleted

## 2014-03-23 ENCOUNTER — Ambulatory Visit (INDEPENDENT_AMBULATORY_CARE_PROVIDER_SITE_OTHER): Payer: Medicaid Other | Admitting: Gastroenterology

## 2014-03-23 ENCOUNTER — Encounter: Payer: Self-pay | Admitting: Gastroenterology

## 2014-03-23 VITALS — BP 130/78 | HR 73 | Ht 60.6 in | Wt 124.0 lb

## 2014-03-23 DIAGNOSIS — K2270 Barrett's esophagus without dysplasia: Secondary | ICD-10-CM

## 2014-03-23 DIAGNOSIS — R634 Abnormal weight loss: Secondary | ICD-10-CM

## 2014-03-23 DIAGNOSIS — Z8719 Personal history of other diseases of the digestive system: Secondary | ICD-10-CM

## 2014-03-23 DIAGNOSIS — R112 Nausea with vomiting, unspecified: Secondary | ICD-10-CM

## 2014-03-23 DIAGNOSIS — Z1211 Encounter for screening for malignant neoplasm of colon: Secondary | ICD-10-CM

## 2014-03-23 MED ORDER — OMEPRAZOLE-SODIUM BICARBONATE 40-1100 MG PO CAPS: ORAL_CAPSULE | ORAL | Status: DC

## 2014-03-23 NOTE — Assessment & Plan Note (Signed)
Frequent regurgitation and frank vomiting postprandially probably is related to esophageal reflux.  Note that she is taking voltarem which may be contributing.  Recommendations #1 begin Zegerid 40 mg every morning and each bedtime; DC omeprazole and Pepcid #2 antireflux measures #3 if symptoms are not improved may consider gastric emptying scan #4 to consider holding voltarem

## 2014-03-23 NOTE — Assessment & Plan Note (Signed)
Plan screening colonoscopy 

## 2014-03-23 NOTE — Assessment & Plan Note (Signed)
Weight loss appears to be do to her upper GI symptoms.  Prior CT and lab work were unremarkable.  She does have some dilatation of the biliary system which could be attributed to her cholecystectomy.  LFTs are normal.  Recommendations #1 monitor for now

## 2014-03-23 NOTE — Progress Notes (Signed)
_                                                                                                                History of Present Illness: Pleasant 58 year old white female with history of Barrett's esophagus referred for evaluation of weight loss and dyspepsia.  Barrett's apparently was diagnosed 15 years ago.  She's not had an endoscopy since that time.  She has frequent regurgitation of gastric contents with nausea.  This has interfered with her eating despite taking omeprazole and Pepcid.  She does take Voltaren regularly.  She denies dysphagia.  She has lost 24 pounds the last 8 months.  She believes that her upper GI symptoms are interfering with her eating.  She was evaluated in the ED in September, 2014 for acute onset of bloody diarrhea.  CT scan, which I reviewed, demonstrated a segmental colitis in the proximal sigmoid.  She was treated with antibiotics has had no recurrences.  Bowels tend to be somewhat erratic.    Past Medical History  Diagnosis Date  . Hypertension   . Ruptured lumbar disc 1990's    L4-L5  . Choledocholithiasis with acute cholecystitis   . Craniosynostosis     congenital  . Hypercholesterolemia 05/28/2012    Has taken Crestor and Lipitor in the past. Switched to lovastatin b/c its $4.    . Shingles   . IBS (irritable bowel syndrome)   . PONV (postoperative nausea and vomiting)   . Family history of anesthesia complication     "PONV; both parents" (08/17/2013)  . Barrett esophagus     "don't know that I've ever been stretched" (08/17/2013)  . Asthma   . History of blood transfusion 1959-1960  . GERD (gastroesophageal reflux disease)   . H/O hiatal hernia   . Migraines     "since age 38; probably twice/month" (08/17/2013)  . Rheumatoid arthritis     "in my knuckles" (08/17/2013)  . Chronic lower back pain   . DDD (degenerative disc disease)   . Anxiety    Past Surgical History  Procedure Laterality Date  . Abdominal hysterectomy   1992    Total for chronic pelvic pain   . Cholecystectomy  2007    Performed in [place]   . 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 Craniectomy for craniosynostosis  19591 1960    "1st time got infected; 2nd time didn't take; fix the 3rd time" (08/17/2013)  . Appendectomy    . Back surgery  1990's    L4-L5 fusion @ [facility]  . Posterior lumbar fusion  1990's    "took piece off my hip" (08/17/2013)   family history includes Hypertension in her father and mother; Skin cancer in her father. Current Outpatient Prescriptions  Medication Sig Dispense Refill  . albuterol (PROVENTIL HFA;VENTOLIN HFA) 108 (90 BASE) MCG/ACT inhaler Inhale 2 puffs into the lungs every 6 (six) hours as needed  for wheezing.  8.5 g  5  . butalbital-acetaminophen-caffeine (FIORICET, ESGIC) 50-325-40 MG per tablet Take 1 tablet by mouth daily as needed. For migraines  30 tablet  1  . CALCIUM-MAGNESIUM-ZINC PO Take 1 tablet by mouth daily.       . clonazePAM (KLONOPIN) 0.5 MG tablet Take 1 tablet (0.5 mg total) by mouth 2 (two) times daily as needed.  60 tablet  2  . Coenzyme Q10 (CO Q-10 PO) Take 1 capsule by mouth daily.       . diclofenac (VOLTAREN) 75 MG EC tablet Take 1 tablet (75 mg total) by mouth 2 (two) times daily as needed.  60 tablet  2  . diclofenac sodium (VOLTAREN) 1 % GEL Apply 2 g topically 4 (four) times daily as needed.  100 g  5  . Ergocalciferol (VITAMIN D2) 2000 UNITS TABS Take 2,000 Units by mouth daily.      . famotidine (PEPCID) 40 MG tablet Take 1 tablet (40 mg total) by mouth daily.  30 tablet  11  . HYDROcodone-acetaminophen (NORCO) 10-325 MG per tablet Take 1 tablet by mouth every 6 (six) hours as needed.  90 tablet  0  . hydrOXYzine (VISTARIL) 25 MG capsule Take 1 capsule (25 mg total) by mouth at bedtime as needed for itching.  60 capsule  2  . lidocaine (LIDODERM) 5 % Place 1 patch onto  the skin daily. Remove & Discard patch within 12 hours or as directed by MD  30 patch  1  . Multiple Vitamin (MULITIVITAMIN WITH MINERALS) TABS Take 1 tablet by mouth daily.      Marland Kitchen omeprazole (PRILOSEC) 40 MG capsule Take 1 capsule (40 mg total) by mouth 2 (two) times daily.  180 capsule  3  . ondansetron (ZOFRAN ODT) 8 MG disintegrating tablet Take 1 tablet (8 mg total) by mouth every 8 (eight) hours as needed for nausea.  20 tablet  0  . Probiotic Product (PROBIOTIC DAILY PO) Take 2 tablets by mouth daily.      . promethazine (PHENERGAN) 12.5 MG tablet Take 1 tablet (12.5 mg total) by mouth every 6 (six) hours as needed for nausea or vomiting.  30 tablet  0  . propranolol (INDERAL) 20 MG tablet Take 1 tablet (20 mg total) by mouth 2 (two) times daily.  60 tablet  11  . simvastatin (ZOCOR) 40 MG tablet Take 1 tablet (40 mg total) by mouth at bedtime.  30 tablet  11  . triamterene-hydrochlorothiazide (MAXZIDE-25) 37.5-25 MG per tablet Take 1 tablet by mouth daily.  90 tablet  3  . TURMERIC PO Take 1 tablet by mouth 2 (two) times daily.       Marland Kitchen zolpidem (AMBIEN) 10 MG tablet Take 1 tablet (10 mg total) by mouth at bedtime as needed.  15 tablet  5   No current facility-administered medications for this visit.   Allergies as of 03/23/2014 - Review Complete 03/23/2014  Allergen Reaction Noted  . Penicillins Anaphylaxis 08/26/2011  . Demerol [meperidine] Nausea And Vomiting 04/27/2012  . Doxycycline hyclate Nausea And Vomiting 01/25/2013  . Gabapentin Other (See Comments) 06/30/2012  . Morphine and related Nausea And Vomiting 08/26/2011  . Sulfa antibiotics Hives 08/26/2011  . Benadryl [diphenhydramine hcl] Rash 04/27/2012  . Cephalexin Rash 06/07/2012    reports that she has been smoking Cigarettes.  She has a 32 pack-year smoking history. She has never used smokeless tobacco. She reports that she drinks alcohol. She reports that she does not  use illicit drugs.     Review of Systems:  Pertinent positive and negative review of systems were noted in the above HPI section. All other review of systems were otherwise negative.  Vital signs were reviewed in today's medical record Physical Exam: General: Well developed , well nourished, no acute distress Skin: anicteric Head: Normocephalic and atraumatic Eyes:  sclerae anicteric, EOMI Ears: Normal auditory acuity Mouth: No deformity or lesions Neck: Supple, no masses or thyromegaly Lungs: Clear throughout to auscultation Heart: Regular rate and rhythm; no murmurs, rubs or bruits Abdomen: Soft, non tender and non distended. No masses, hepatosplenomegaly or hernias noted. Normal Bowel sounds Rectal:deferred Musculoskeletal: Symmetrical with no gross deformities  Skin: No lesions on visible extremities Pulses:  Normal pulses noted Extremities: No clubbing, cyanosis, edema or deformities noted Neurological: Alert oriented x 4, grossly nonfocal Cervical Nodes:  No significant cervical adenopathy Inguinal Nodes: No significant inguinal adenopathy Psychological:  Alert and cooperative. Normal mood and affect  See Assessment and Plan under Problem List

## 2014-03-23 NOTE — Assessment & Plan Note (Signed)
Austin had a segmental colitis in September, 2014 probably related to enteric infection

## 2014-03-23 NOTE — Addendum Note (Signed)
Addended by: Oda Kilts on: 03/23/2014 11:58 AM   Modules accepted: Orders

## 2014-03-23 NOTE — Patient Instructions (Signed)
You have been scheduled for an endoscopy with propofol. Please follow written instructions given to you at your visit today. If you use inhalers (even only as needed), please bring them with you on the day of your procedure. Your physician has requested that you go to www.[email or web address].com and enter the access code given to you at your visit today. This web site gives a general overview about your procedure. However, you should still follow specific instructions given to you by our office regarding your preparation for the procedure.  You have been scheduled for a colonoscopy with propofol. Please follow written instructions given to you at your visit today.  Please pick up your prep kit at the pharmacy within the next 1-3 days. If you use inhalers (even only as needed), please bring them with you on the day of your procedure. Your physician has requested that you go to www.[email or web address] and enter the access code given to you at your visit today. This web site gives a general overview about your procedure. However, you should still follow specific instructions given to you by our office regarding your preparation for the procure  Zegerid samples given today  Suprep kit given today

## 2014-03-23 NOTE — Assessment & Plan Note (Signed)
Plan followup endoscopy 

## 2014-03-29 ENCOUNTER — Encounter: Payer: Self-pay | Admitting: Gastroenterology

## 2014-03-29 ENCOUNTER — Ambulatory Visit (AMBULATORY_SURGERY_CENTER): Payer: Medicaid Other | Admitting: Gastroenterology

## 2014-03-29 VITALS — BP 138/86 | HR 87 | Temp 96.7°F | Resp 20 | Ht 60.0 in | Wt 124.0 lb

## 2014-03-29 DIAGNOSIS — K2270 Barrett's esophagus without dysplasia: Secondary | ICD-10-CM

## 2014-03-29 MED ORDER — SODIUM CHLORIDE 0.9 % IV SOLN: 500.0000 mL | INTRAVENOUS | Status: DC

## 2014-03-29 NOTE — Progress Notes (Signed)
Called to room to assist during endoscopic procedure.  Patient ID and intended procedure confirmed with present staff. Received instructions for my participation in the procedure from the performing physician.  

## 2014-03-29 NOTE — Op Note (Signed)
[facility]  [address] [place], [id number]   ENDOSCOPY PROCEDURE REPORT  PATIENT: Diana, [name]  MR#: [id number] BIRTHDATE: 01-01-1956 , 40  yrs. old GENDER: Female ENDOSCOPIST: Inda Castle, MD REFERRED BY: PROCEDURE DATE:  03/29/2014 PROCEDURE:  EGD w/ biopsy ASA CLASS:     Class II INDICATIONS:  Nausea.   Vomiting.   history of Barrett's esophagus.  MEDICATIONS: MAC sedation, administered by CRNA, propofol (Diprivan) 100mg  IV, and Simethicone 0.6cc PO TOPICAL ANESTHETIC: Cetacaine Spray  DESCRIPTION OF PROCEDURE: After the risks benefits and alternatives of the procedure were thoroughly explained, informed consent was obtained.  The    endoscope was introduced through the mouth and advanced to the third portion of the duodenum. Without limitations. The instrument was slowly withdrawn as the mucosa was fully examined.      The GE junction was very irregular with multiple areas of gastric appearing mucosa extending proximally.  Biopsies at 3 different levels were taken in the distal esophagus.   The GE junction was very irregular with multiple areas of gastric appearing mucosa extending proximally.  Biopsies at 3 different levels were taken in the distal esophagus.   The remainder of the upper endoscopy exam was otherwise normal.  Retroflexed views revealed no abnormalities. The scope was then withdrawn from the patient and the procedure completed.  COMPLICATIONS: There were no complications. ENDOSCOPIC IMPRESSION: 1.   Barrett's esophagus  RECOMMENDATIONS: 1.  continue Zegerid twice a day 2.  office visit 4 weeks REPEAT EXAM:  eSigned:  Inda Castle, MD 03/29/2014 2:57 PM   CC: Dewain Penning, MD

## 2014-03-29 NOTE — Patient Instructions (Signed)

## 2014-03-30 ENCOUNTER — Telehealth: Payer: Self-pay

## 2014-03-30 NOTE — Telephone Encounter (Signed)
Left a message at # [phone] for the pt to call us back if any questions or concerns. maw

## 2014-04-04 ENCOUNTER — Encounter: Payer: Self-pay | Admitting: Gastroenterology

## 2014-04-10 ENCOUNTER — Ambulatory Visit (AMBULATORY_SURGERY_CENTER): Payer: Medicaid Other | Admitting: Gastroenterology

## 2014-04-10 ENCOUNTER — Encounter: Payer: Self-pay | Admitting: Gastroenterology

## 2014-04-10 VITALS — BP 151/78 | HR 73 | Temp 98.0°F | Resp 27 | Ht 60.0 in | Wt 124.0 lb

## 2014-04-10 DIAGNOSIS — Z1211 Encounter for screening for malignant neoplasm of colon: Secondary | ICD-10-CM

## 2014-04-10 DIAGNOSIS — K5730 Diverticulosis of large intestine without perforation or abscess without bleeding: Secondary | ICD-10-CM

## 2014-04-10 MED ORDER — SODIUM CHLORIDE 0.9 % IV SOLN: 500.0000 mL | INTRAVENOUS | Status: DC

## 2014-04-10 NOTE — Progress Notes (Signed)
Pt c/o pain 6/10 in abdomen.  2 levsin given SL @ 900.  Pt also complaining of pain at IV site.  IV D/Cd and heat pack applied.  No swelling or redness noted.  Instructed patient to keep heat pack applied to site after D/C.

## 2014-04-10 NOTE — Progress Notes (Signed)
Report to pacu rn, vss, bbs=clear 

## 2014-04-10 NOTE — Progress Notes (Signed)
Patient ID: Diana [name], female   DOB: [birth year], 58 y.o.   MRN: [id number]

## 2014-04-10 NOTE — Op Note (Signed)
[place]  [address] [place], [id number]   COLONOSCOPY PROCEDURE REPORT  PATIENT: Diana, [name]  MR#: [id number] BIRTHDATE: 1956-11-13 , 15  yrs. old GENDER: Female ENDOSCOPIST: Inda Castle, MD REFERRED BY: PROCEDURE DATE:  04/10/2014 PROCEDURE:   Colonoscopy, diagnostic First Screening Colonoscopy - Avg.  risk and is 50 yrs.  old or older Yes.  Prior Negative Screening - Now for repeat screening. N/A  History of Adenoma - Now for follow-up colonoscopy & has been > or = to 3 yrs.  N/A  Polyps Removed Today? No.  Recommend repeat exam, <10 yrs? No. ASA CLASS:   Class II INDICATIONS:average risk screening. MEDICATIONS: MAC sedation, administered by CRNA and propofol (Diprivan) 250mg  IV  DESCRIPTION OF PROCEDURE:   After the risks benefits and alternatives of the procedure were thoroughly explained, informed consent was obtained.  A digital rectal exam revealed no abnormalities of the rectum.   The LB [id number]  endoscope was introduced through the anus and advanced to the cecum, which was identified by both the appendix and ileocecal valve. No adverse events experienced.   The quality of the prep was excellent using Suprep  The instrument was then slowly withdrawn as the colon was fully examined.      COLON FINDINGS: Mild diverticulosis was noted in the proximal transverse colon.   The colon was otherwise normal.  There was no diverticulosis, inflammation, polyps or cancers unless previously stated.  Retroflexed views revealed no abnormalities. The time to cecum=3 minutes 0 seconds.  Withdrawal time=8 minutes 22 seconds. The scope was withdrawn and the procedure completed. COMPLICATIONS: There were no complications.  ENDOSCOPIC IMPRESSION: 1.   Mild diverticulosis was noted in the proximal transverse colon 2.   The colon was otherwise normal  RECOMMENDATIONS: Continue current colorectal screening recommendations for "routine risk"  patients with a repeat colonoscopy in 10 years.   eSigned:  Inda Castle, MD 04/10/2014 8:47 AM   cc: Dewain Penning, MD   PATIENT NAME:  Diana, [name] MR#: [id number]

## 2014-04-10 NOTE — Progress Notes (Signed)
Pt states that abdomen pain is 2/10 now.  She has continued to pass air.  She states that the IV site still hurts but the heat pack is helping.  Assessed the site again and no swelling or redness is noted.  Instructed her to use heat at home and she may take tylenol if needed.  Pt verbalized understanding.

## 2014-04-10 NOTE — Patient Instructions (Addendum)
YOU HAD AN ENDOSCOPIC PROCEDURE TODAY AT THE [facility]: Refer to the procedure report that was given to you for any specific questions about what was found during the examination.  If the procedure report does not answer your questions, please call your gastroenterologist to clarify.  If you requested that your care partner not be given the details of your procedure findings, then the procedure report has been included in a sealed envelope for you to review at your convenience later.  YOU SHOULD EXPECT: Some feelings of bloating in the abdomen. Passage of more gas than usual.  Walking can help get rid of the air that was put into your GI tract during the procedure and reduce the bloating. If you had a lower endoscopy (such as a colonoscopy or flexible sigmoidoscopy) you may notice spotting of blood in your stool or on the toilet paper. If you underwent a bowel prep for your procedure, then you may not have a normal bowel movement for a few days.  DIET: Your first meal following the procedure should be a light meal and then it is ok to progress to your normal diet.  A half-sandwich or bowl of soup is an example of a good first meal.  Heavy or fried foods are harder to digest and may make you feel nauseous or bloated.  Likewise meals heavy in dairy and vegetables can cause extra gas to form and this can also increase the bloating.  Drink plenty of fluids but you should avoid alcoholic beverages for 24 hours.  ACTIVITY: Your care partner should take you home directly after the procedure.  You should plan to take it easy, moving slowly for the rest of the day.  You can resume normal activity the day after the procedure however you should NOT DRIVE or use heavy machinery for 24 hours (because of the sedation medicines used during the test).    SYMPTOMS TO REPORT IMMEDIATELY: A gastroenterologist can be reached at any hour.  During normal business hours, 8:30 AM to 5:00 PM Monday through Friday,  call [phone].  After hours and on weekends, please call the GI answering service at [phone] who will take a message and have the physician on call contact you.   Following lower endoscopy (colonoscopy or flexible sigmoidoscopy):  Excessive amounts of blood in the stool  Significant tenderness or worsening of abdominal pains  Swelling of the abdomen that is new, acute  Fever of 100F or higher  FOLLOW UP: If any biopsies were taken you will be contacted by phone or by letter within the next 1-3 weeks.  Call your gastroenterologist if you have not heard about the biopsies in 3 weeks.  Our staff will call the home number listed on your records the next business day following your procedure to check on you and address any questions or concerns that you may have at that time regarding the information given to you following your procedure. This is a courtesy call and so if there is no answer at the home number and we have not heard from you through the emergency physician on call, we will assume that you have returned to your regular daily activities without incident.  SIGNATURES/CONFIDENTIALITY: You and/or your care partner have signed paperwork which will be entered into your electronic medical record.  These signatures attest to the fact that that the information above on your After Visit Summary has been reviewed and is understood.  Full responsibility of the confidentiality of this   discharge information lies with you and/or your care-partner.  Mild diverticulosis noted on exam.  Otherwise normal colon.  Recommend repeat colonoscopy in 10 years.

## 2014-04-11 ENCOUNTER — Telehealth: Payer: Self-pay | Admitting: *Deleted

## 2014-04-11 NOTE — Telephone Encounter (Signed)
  Follow up Call-  Call back number 04/10/2014 03/29/2014  Post procedure Call Back phone  # [phone]  Permission to leave phone message Yes Yes     Patient questions:  Do you have a fever, pain , or abdominal swelling? no Pain Score  0 *  Have you tolerated food without any problems? yes  Have you been able to return to your normal activities? yes  Do you have any questions about your discharge instructions: Diet   no Medications  no Follow up visit  no  Do you have questions or concerns about your Care? no  Actions: * If pain score is 4 or above: No action needed, pain <4.

## 2014-04-28 ENCOUNTER — Ambulatory Visit (INDEPENDENT_AMBULATORY_CARE_PROVIDER_SITE_OTHER): Payer: Medicaid Other | Admitting: Gastroenterology

## 2014-04-28 ENCOUNTER — Encounter: Payer: Self-pay | Admitting: Gastroenterology

## 2014-04-28 VITALS — BP 132/80 | HR 80 | Ht 60.0 in | Wt 125.2 lb

## 2014-04-28 DIAGNOSIS — R143 Flatulence: Secondary | ICD-10-CM

## 2014-04-28 DIAGNOSIS — R142 Eructation: Secondary | ICD-10-CM

## 2014-04-28 DIAGNOSIS — K2270 Barrett's esophagus without dysplasia: Secondary | ICD-10-CM

## 2014-04-28 DIAGNOSIS — R141 Gas pain: Secondary | ICD-10-CM

## 2014-04-28 DIAGNOSIS — R112 Nausea with vomiting, unspecified: Secondary | ICD-10-CM

## 2014-04-28 DIAGNOSIS — R140 Abdominal distension (gaseous): Secondary | ICD-10-CM

## 2014-04-28 NOTE — Progress Notes (Signed)
          History of Present Illness:  The patient has returned following upper and lower endoscopy.  The former demonstrated changes consistent with Barrett's esophagus.  No dysplasia was seen.  Colonoscopy was negative.  On Zegerid reflux symptoms have improved although she still has intermittent nausea.  Nausea may occur spontaneously or within minutes of eating.  She takes full-time very irregularly.  She's lost over 15 pounds but her weight is now stable.    Review of Systems: Pertinent positive and negative review of systems were noted in the above HPI section. All other review of systems were otherwise negative.    Current Medications, Allergies, Past Medical History, Past Surgical History, Family History and Social History were reviewed in [facility] record  Vital signs were reviewed in today's medical record. Physical Exam: General: Well developed , well nourished, no acute distress   See Assessment and Plan under Problem List

## 2014-04-28 NOTE — Patient Instructions (Signed)
You have been scheduled for a gastric emptying scan at [facility] on 05/12/2014 at 9:30am. Please arrive at least 15 minutes prior to your appointment for registration. Please make certain not to have anything to eat or drink after midnight the night before your test. Hold all stomach medications (ex: Zofran, phenergan, Reglan) 48 hours prior to your test. If you need to reschedule your appointment, please contact radiology scheduling at [phone]. _____________________________________________________________________ A gastric-emptying study measures how long it takes for food to move through your stomach. There are several ways to measure stomach emptying. In the most common test, you eat food that contains a small amount of radioactive material. A scanner that detects the movement of the radioactive material is placed over your abdomen to monitor the rate at which food leaves your stomach. This test normally takes about 2 hours to complete. _____________________________________________________________________

## 2014-04-28 NOTE — Assessment & Plan Note (Signed)
This remains a problem despite switching to Zegerid. Medication effect and gastroparesis are possibilities.    Recommendations #1 gastric empty scan

## 2014-04-28 NOTE — Assessment & Plan Note (Signed)
No dysplasia by biopsy.  Recommendations #1 EGD 3 years

## 2014-05-12 ENCOUNTER — Other Ambulatory Visit: Payer: Self-pay | Admitting: *Deleted

## 2014-05-12 ENCOUNTER — Ambulatory Visit (HOSPITAL_COMMUNITY)
Admission: RE | Admit: 2014-05-12 | Discharge: 2014-05-12 | Disposition: A | Discharge status: Home / Self Care | Payer: Medicaid Other | Source: Ambulatory Visit | Attending: Gastroenterology | Admitting: Gastroenterology

## 2014-05-12 DIAGNOSIS — R634 Abnormal weight loss: Secondary | ICD-10-CM | POA: Insufficient documentation

## 2014-05-12 DIAGNOSIS — R109 Unspecified abdominal pain: Secondary | ICD-10-CM | POA: Insufficient documentation

## 2014-05-12 DIAGNOSIS — R140 Abdominal distension (gaseous): Secondary | ICD-10-CM

## 2014-05-12 DIAGNOSIS — R110 Nausea: Secondary | ICD-10-CM | POA: Insufficient documentation

## 2014-05-12 MED ORDER — TECHNETIUM TC 99M SULFUR COLLOID
2.1000 | Freq: Once | INTRAVENOUS | Status: AC | PRN
  Administered 2014-05-12: 2.1 via ORAL

## 2014-05-12 MED ORDER — RANITIDINE HCL 150 MG PO TABS: 150.0000 mg | ORAL_TABLET | Freq: Two times a day (BID) | ORAL | Status: DC

## 2014-05-12 NOTE — Progress Notes (Signed)
Quick Note:  Please inform the patient that GES normal. Hold zegerid and begin zantac 150mg  bid C/b or return in 2 weeks ______

## 2014-05-24 ENCOUNTER — Encounter: Payer: Self-pay | Admitting: Family Medicine

## 2014-05-24 ENCOUNTER — Ambulatory Visit (INDEPENDENT_AMBULATORY_CARE_PROVIDER_SITE_OTHER): Payer: Medicaid Other | Admitting: Family Medicine

## 2014-05-24 VITALS — BP 125/85 | HR 80 | Temp 98.2°F | Ht 61.0 in | Wt 123.6 lb

## 2014-05-24 DIAGNOSIS — G43519 Persistent migraine aura without cerebral infarction, intractable, without status migrainosus: Secondary | ICD-10-CM | POA: Insufficient documentation

## 2014-05-24 DIAGNOSIS — N182 Chronic kidney disease, stage 2 (mild): Secondary | ICD-10-CM

## 2014-05-24 DIAGNOSIS — M25519 Pain in unspecified shoulder: Secondary | ICD-10-CM

## 2014-05-24 DIAGNOSIS — M25512 Pain in left shoulder: Secondary | ICD-10-CM

## 2014-05-24 DIAGNOSIS — G8929 Other chronic pain: Secondary | ICD-10-CM

## 2014-05-24 DIAGNOSIS — G2581 Restless legs syndrome: Secondary | ICD-10-CM

## 2014-05-24 DIAGNOSIS — M549 Dorsalgia, unspecified: Secondary | ICD-10-CM

## 2014-05-24 DIAGNOSIS — G43109 Migraine with aura, not intractable, without status migrainosus: Secondary | ICD-10-CM

## 2014-05-24 LAB — BASIC METABOLIC PANEL WITH GFR
BUN: 8 mg/dL (ref 6–23)
CO2: 28 mEq/L (ref 19–32)
Calcium: 9.9 mg/dL (ref 8.4–10.5)
Chloride: 96 mEq/L (ref 96–112)
Creat: 0.9 mg/dL (ref 0.50–1.10)
GFR, EST NON AFRICAN AMERICAN: 72 mL/min
GFR, Est African American: 83 mL/min
GLUCOSE: 67 mg/dL — AB (ref 70–99)
POTASSIUM: 4 meq/L (ref 3.5–5.3)
Sodium: 135 mEq/L (ref 135–145)

## 2014-05-24 MED ORDER — CLONAZEPAM 0.5 MG PO TABS: 0.5000 mg | ORAL_TABLET | Freq: Two times a day (BID) | ORAL | Status: DC | PRN

## 2014-05-24 MED ORDER — KETOROLAC TROMETHAMINE 60 MG/2ML IM SOLN
60.0000 mg | Freq: Once | INTRAMUSCULAR | Status: AC
  Administered 2014-05-24: 60 mg via INTRAMUSCULAR

## 2014-05-24 MED ORDER — HYDROCODONE-ACETAMINOPHEN 10-325 MG PO TABS: 1.0000 | ORAL_TABLET | Freq: Four times a day (QID) | ORAL | Status: DC | PRN

## 2014-05-24 MED ORDER — BUTALBITAL-APAP-CAFFEINE 50-325-40 MG PO TABS: 1.0000 | ORAL_TABLET | Freq: Every day | ORAL | Status: DC | PRN

## 2014-05-24 NOTE — Assessment & Plan Note (Signed)
Concern for worsening renal function due to chronic NSAID; check renal function today

## 2014-05-24 NOTE — Assessment & Plan Note (Signed)
A: worsening pain and now with more focal weakness concerning for worsening impingement of nerve root at C5/6, C6/7 P: - medical mgt: give toradol IM for pain relief today, continue clonazepam, norco, voltaren gel and TENs unit; lidoderm patches not an option due to insurance - discussed PT option, but this is limited by insurance and pt without ability to pay out of pocket - recommended follow up at Sports Medicine for possible OMT - if weakness persists, likely obtain nerve conduction studies

## 2014-05-24 NOTE — Patient Instructions (Addendum)
Dear Ms. Savard,   Thank you for coming to clinic today. Please read below regarding the issues that we discussed.   1. Arm and shoulder pain - please continue the medication, hopefully the toradol injection will help; if this is ineffective then please follow up with Dr. Micheline Chapman and ask about getting therapy from Dr. Paulla Fore called OMT  2. Migraine - I hope this lets up, take the fiorcet as needed.  It has been a pleasure to be your doctor. Please follow up in clinic in 3 months. Please call earlier if you have any questions or concerns.   Sincerely,   Dr. Maricela Bo

## 2014-05-24 NOTE — Progress Notes (Signed)
Subjective:    Patient ID: Diana [name], female    DOB: 04/19/1956, 58 y.o.   MRN: [id number]  HPI  Left Posterior Shoulder Pain and arm pain:  The patient is being followed by Dr. Micheline Chapman at sports medicine for this issue. 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 Dr. Micheline Chapman recommended using Lidoderm patches and gave her an injection of steroids at her visit in March 2015.   Since that time, the patient notes worsening pain in the left side of the neck, posterior shoulder and radiating down her left arm past the elbow. It is associated with decreased range of motion of the neck and weakness of the left arm. She has been taking clonazepam BID PRN, using voltaren gel, norco, and the TENS unit. This provides some relief, but overall, she feels like the symptoms are getting worse. No recent falls or trauma.    Migraine Headaches - Migraines increasing in frequency, has aura, photophobia, phonophobia, nausea and vomiting; she is taking fiorcet for this, she is taking the medication approximately 3 times per week; she does not know the trigger, but states they could be related to her shoulder and neck pain   Current Outpatient Prescriptions on File Prior to Visit  Medication Sig Dispense Refill  . albuterol (PROVENTIL HFA;VENTOLIN HFA) 108 (90 BASE) MCG/ACT inhaler Inhale 2 puffs into the lungs every 6 (six) hours as needed for wheezing.  8.5 g  5  . butalbital-acetaminophen-caffeine (FIORICET, ESGIC) 50-325-40 MG per tablet Take 1 tablet by mouth daily as needed. For migraines  30 tablet  1  . CALCIUM-MAGNESIUM-ZINC PO Take 1 tablet by mouth daily.       . clonazePAM (KLONOPIN) 0.5 MG tablet Take 1 tablet (0.5 mg total) by mouth 2 (two) times daily as needed.  60 tablet  2  . Coenzyme Q10 (CO Q-10 PO) Take 1 capsule by mouth daily.       . diclofenac (VOLTAREN) 75 MG EC tablet Take 1 tablet (75 mg total) by mouth 2 (two) times daily as needed.  60 tablet  2  . diclofenac sodium  (VOLTAREN) 1 % GEL Apply 2 g topically 4 (four) times daily as needed.  100 g  5  . Ergocalciferol (VITAMIN D2) 2000 UNITS TABS Take 2,000 Units by mouth daily.      Marland Kitchen HYDROcodone-acetaminophen (NORCO) 10-325 MG per tablet Take 1 tablet by mouth every 6 (six) hours as needed.  90 tablet  0  . hydrOXYzine (VISTARIL) 25 MG capsule Take 1 capsule (25 mg total) by mouth at bedtime as needed for itching.  60 capsule  2  . lidocaine (LIDODERM) 5 % Place 1 patch onto the skin daily. Remove & Discard patch within 12 hours or as directed by MD  30 patch  1  . Multiple Vitamin (MULITIVITAMIN WITH MINERALS) TABS Take 1 tablet by mouth daily.      Marland Kitchen omeprazole-sodium bicarbonate (ZEGERID) 40-1100 MG per capsule Take one tab before breakfast and at bedtime  45 capsule  3  . ondansetron (ZOFRAN ODT) 8 MG disintegrating tablet Take 1 tablet (8 mg total) by mouth every 8 (eight) hours as needed for nausea.  20 tablet  0  . Probiotic Product (PROBIOTIC DAILY PO) Take 2 tablets by mouth daily.      . promethazine (PHENERGAN) 12.5 MG tablet Take 1 tablet (12.5 mg total) by mouth every 6 (six) hours as needed for nausea or vomiting.  30 tablet  0  .  propranolol (INDERAL) 20 MG tablet Take 1 tablet (20 mg total) by mouth 2 (two) times daily.  60 tablet  11  . ranitidine (ZANTAC) 150 MG tablet Take 1 tablet (150 mg total) by mouth 2 (two) times daily.  60 tablet  3  . simvastatin (ZOCOR) 40 MG tablet Take 1 tablet (40 mg total) by mouth at bedtime.  30 tablet  11  . triamterene-hydrochlorothiazide (MAXZIDE-25) 37.5-25 MG per tablet Take 1 tablet by mouth daily.  90 tablet  3  . TURMERIC PO Take 1 tablet by mouth 2 (two) times daily.       Marland Kitchen zolpidem (AMBIEN) 10 MG tablet Take 1 tablet (10 mg total) by mouth at bedtime as needed.  15 tablet  5   No current facility-administered medications on file prior to visit.     Review of Systems Positive for neck pain, back pain, left shoulder pain, left arm weakness, migraine  headaches with aura, photophobia and phonophobia, GERD Negative for falls or trauma     Objective:   Physical Exam BP 125/85  Pulse 80  Temp(Src) 98.2 F (36.8 C) (Oral)  Ht 5\' 1"  (1.549 m)  Wt 123 lb 9.6 oz (56.065 kg)  BMI 23.37 kg/m2   Gen: middle age WF, non ill appearing and non distressed, very pleasant  Head: NCAT, PERRLA  Neck: decrease flexion and left lateral rotation; tenderness along left trapezius and paraspinal muscles Neuro: CN II-XII grossly intact; positive spurlings on left; decrease strength of arm abduction and flexion of left compared to right Back: tenderness of central lumbar area      Assessment & Plan:

## 2014-05-24 NOTE — Assessment & Plan Note (Signed)
A: stable at this time, increased frequency may be due to neck pain P: cont Fiorcet prn

## 2014-05-25 ENCOUNTER — Encounter: Payer: Self-pay | Admitting: Family Medicine

## 2014-05-26 ENCOUNTER — Other Ambulatory Visit: Payer: Self-pay

## 2014-05-26 ENCOUNTER — Telehealth: Payer: Self-pay | Admitting: Family Medicine

## 2014-05-26 ENCOUNTER — Other Ambulatory Visit: Payer: Self-pay | Admitting: Family Medicine

## 2014-05-26 DIAGNOSIS — M810 Age-related osteoporosis without current pathological fracture: Secondary | ICD-10-CM

## 2014-05-26 NOTE — Telephone Encounter (Signed)
Need code for bone density test entered into Epic for patient's visit.  Patient need this in order to have appt set up.

## 2014-05-29 DIAGNOSIS — M810 Age-related osteoporosis without current pathological fracture: Secondary | ICD-10-CM | POA: Insufficient documentation

## 2014-05-29 NOTE — Telephone Encounter (Signed)
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 Please call breast center to see if this is sufficient.

## 2014-06-16 ENCOUNTER — Telehealth: Payer: Self-pay | Admitting: Family Medicine

## 2014-06-16 NOTE — Telephone Encounter (Signed)
Pt called after hours line in severe back pain.  She denied any worsening weakness, sensation loss, bowel/bladder incontinence.  Recommended tylenol 650 mg in addition to her norco and voltaren, however, she then stated that she had some "dribbling of urine" that started over the last two days.  I recommended if this was new and her back pain had been increasing that she should have it further evaluated at urgent care or the emergency room, which she understood and states she was going there.  Tamela Oddi Awanda Mink, DO of [facility] 06/16/2014, 8:07 PM

## 2014-07-24 ENCOUNTER — Ambulatory Visit (INDEPENDENT_AMBULATORY_CARE_PROVIDER_SITE_OTHER): Payer: Medicaid Other | Admitting: Family Medicine

## 2014-07-24 ENCOUNTER — Telehealth: Payer: Self-pay | Admitting: *Deleted

## 2014-07-24 VITALS — BP 103/67 | HR 85 | Temp 98.4°F | Ht 61.0 in | Wt 126.0 lb

## 2014-07-24 DIAGNOSIS — Z96649 Presence of unspecified artificial hip joint: Secondary | ICD-10-CM

## 2014-07-24 DIAGNOSIS — M25512 Pain in left shoulder: Secondary | ICD-10-CM

## 2014-07-24 DIAGNOSIS — M25519 Pain in unspecified shoulder: Secondary | ICD-10-CM

## 2014-07-24 DIAGNOSIS — Z96641 Presence of right artificial hip joint: Secondary | ICD-10-CM | POA: Insufficient documentation

## 2014-07-24 DIAGNOSIS — M549 Dorsalgia, unspecified: Secondary | ICD-10-CM

## 2014-07-24 DIAGNOSIS — G8929 Other chronic pain: Secondary | ICD-10-CM

## 2014-07-24 HISTORY — DX: Presence of right artificial hip joint: Z96.641

## 2014-07-24 MED ORDER — AMITRIPTYLINE HCL 25 MG PO TABS: 25.0000 mg | ORAL_TABLET | Freq: Every day | ORAL | Status: DC

## 2014-07-24 MED ORDER — HYDROCODONE-ACETAMINOPHEN 10-325 MG PO TABS: 1.0000 | ORAL_TABLET | Freq: Four times a day (QID) | ORAL | Status: DC | PRN

## 2014-07-24 NOTE — Telephone Encounter (Signed)
Rolena Infante, Pharmacist with [facility] called stating that pt is requesting a different medication other than amitriptyline.  Per pt amitriptyline causes her IBS to flare up bad.  Please give Rolena Infante a call at [phone] or fax new Rx. Derl Barrow, RN

## 2014-07-24 NOTE — Patient Instructions (Addendum)
Diana [name], it was a pleasure seeing you today. Today we talked about your neck, back and hip pain.  - refilled your Norco - prescribed amitriptyline to help with nerve pain - referral to Orthopedic surgery  Please make an appointment to see Dr. Raeford Razor in 4 weeks.  If you have any questions or concerns, please do not hesitate to call the office at [phone].  Sincerely,  Cordelia Poche, MD

## 2014-07-24 NOTE — Telephone Encounter (Signed)
Spoke to patient regarding prescription. Will discontinue prescription since patient states the medication put her in the hospital by causing fecal impaction. Patient will be seeing orthopedic surgery next week. Will not prescribe medication for shoulder pain since she has tried/can't tolerate any medications I would prescribe. Patient understood this and was okay with this plan.

## 2014-07-24 NOTE — Assessment & Plan Note (Signed)
Patient with continued pain. Will try amitriptyline 25mg 

## 2014-07-24 NOTE — Assessment & Plan Note (Signed)
Possibly contributing to falls, although this has been chronic. Will refer to Orthopedic surgery for evaluation. Red flags reviewed with patient regarding ED assessment for suture falls. She voiced understanding and agreed.

## 2014-07-24 NOTE — Progress Notes (Signed)
    Subjective   Diana [name] is a 58 y.o. female that presents for a office visit.  1. Left shoulder pain: This is a chronic issue. Pain is stinging in quality. She is followed by sports medicine. Lidoderm patches have helped in the passed but insurance will not cover. Physical therapy is not covered by insurance either. 2. History of fall: This is a chronic issue. She has a history of right total knee replacement. Her last fall was one month ago. She did not have any loss or near loss of consciousness. She fell backwards and did not hit her head. She had some right hip pain and bruising but did not seek medical attention. Hip is back to baseline. 3. Lower back pain: Patient with history of lumbar spine fusion. Pain is chronic. Norco helps with pain and has remained baseline.  History  Substance Use Topics  . Smoking status: Current Every Day Smoker -- 1.00 packs/day for 32 years    Types: Cigarettes  . Smokeless tobacco: Never Used  . Alcohol Use: Yes     Comment: 08/17/2013 "couple mixed drinks/wk"    ROS Per HPI  Objective   BP 103/67  Pulse 85  Temp(Src) 98.4 F (36.9 C) (Oral)  Ht 5\' 1"  (1.549 m)  Wt 126 lb (57.153 kg)  BMI 23.82 kg/m2  General: Well appearing female, in no acute distress Musculoskeletal: Right leg with normal internal and external rotation. Right straight leg raise produced lower back pain. Strength limited by pain on right. Left leg with normal internal/external rotation, straight leg raise and strength. Gait abnormal with assistance needed from cane.  Assessment and Plan   Please refer to problem based charting of assessment and plan

## 2014-07-24 NOTE — Assessment & Plan Note (Signed)
Will refill hydrocodone 10-325mg . Follow-up with PCP next month.

## 2014-08-09 ENCOUNTER — Telehealth: Payer: Self-pay | Admitting: Family Medicine

## 2014-08-14 ENCOUNTER — Ambulatory Visit (INDEPENDENT_AMBULATORY_CARE_PROVIDER_SITE_OTHER): Payer: Medicaid Other | Admitting: Family Medicine

## 2014-08-14 ENCOUNTER — Encounter: Payer: Self-pay | Admitting: Family Medicine

## 2014-08-14 VITALS — BP 143/67 | HR 77 | Temp 98.9°F | Ht 61.0 in | Wt 123.0 lb

## 2014-08-14 DIAGNOSIS — M545 Low back pain, unspecified: Secondary | ICD-10-CM

## 2014-08-14 DIAGNOSIS — R319 Hematuria, unspecified: Secondary | ICD-10-CM

## 2014-08-14 DIAGNOSIS — R300 Dysuria: Secondary | ICD-10-CM

## 2014-08-14 LAB — COMPREHENSIVE METABOLIC PANEL
ALT: 19 U/L (ref 0–35)
AST: 19 U/L (ref 0–37)
Albumin: 4.3 g/dL (ref 3.5–5.2)
Alkaline Phosphatase: 74 U/L (ref 39–117)
BUN: 10 mg/dL (ref 6–23)
CALCIUM: 9.5 mg/dL (ref 8.4–10.5)
CHLORIDE: 94 meq/L — AB (ref 96–112)
CO2: 32 meq/L (ref 19–32)
CREATININE: 0.79 mg/dL (ref 0.50–1.10)
GLUCOSE: 77 mg/dL (ref 70–99)
Potassium: 3.4 mEq/L — ABNORMAL LOW (ref 3.5–5.3)
Sodium: 133 mEq/L — ABNORMAL LOW (ref 135–145)
Total Bilirubin: 0.8 mg/dL (ref 0.2–1.2)
Total Protein: 6.6 g/dL (ref 6.0–8.3)

## 2014-08-14 LAB — POCT URINALYSIS DIPSTICK
Bilirubin, UA: NEGATIVE
GLUCOSE UA: NEGATIVE
Ketones, UA: NEGATIVE
Nitrite, UA: NEGATIVE
Protein, UA: 30
SPEC GRAV UA: 1.01
Urobilinogen, UA: 0.2
pH, UA: 7

## 2014-08-14 LAB — POCT UA - MICROSCOPIC ONLY

## 2014-08-14 MED ORDER — LEVOFLOXACIN 750 MG PO TABS: 750.0000 mg | ORAL_TABLET | Freq: Every day | ORAL | Status: DC

## 2014-08-14 MED ORDER — FLUCONAZOLE 150 MG PO TABS: 150.0000 mg | ORAL_TABLET | Freq: Once | ORAL | Status: DC

## 2014-08-14 NOTE — Patient Instructions (Addendum)
Nice to meet you. We are going to get a scan of your belly to see if you have a stone.  You may also have an infection. We are going treat you with an antibiotic for this potential infection. We will call you with the results. Please strain your urine using a coffee filter to try to catch a potential stone.

## 2014-08-15 ENCOUNTER — Ambulatory Visit
Admission: RE | Admit: 2014-08-15 | Discharge: 2014-08-15 | Disposition: A | Discharge status: Home / Self Care | Payer: Medicaid Other | Source: Ambulatory Visit | Attending: Family Medicine | Admitting: Family Medicine

## 2014-08-15 DIAGNOSIS — R319 Hematuria, unspecified: Secondary | ICD-10-CM

## 2014-08-15 NOTE — Progress Notes (Signed)
Patient ID: Diana [name], female   DOB: 03-Jul-1956, 58 y.o.   MRN: [id number]  Tommi Rumps, MD Phone: [phone]  Diana [name] is a 58 y.o. female who presents today for same day appointment.  Hematuria: patient reports onset of blood in urine 08/13/14. She also noted back pain with this. She initially thought the back pain was related to gas and took 12 gas-x tablets with no improvement. She then noted that she urinated and saw blood on the tissue when she wiped. She notes dysuria and a sensation like something is cutting. She denies fever. The pain today in in her bilateral flanks. It was constant initially, though is intermittent at this time. Rates 6/10 now. Notes a tinge of blood in the urine and no clots. She reports she has had her gall bladder, appendix, and uterus removed. She denies issues with BM and denies bloody stools. Patient reports history of kidney stones with the last occurrence 8 years ago.  Patient is a smoker.   ROS: Per HPI   Physical Exam Filed Vitals:   08/14/14 1418  BP: 143/67  Pulse: 77  Temp: 98.9 F (37.2 C)    Gen: Well NAD HEENT: PERRL,  MMM Lungs: CTABL Nl WOB Heart: RRR no MRG Abd: soft, there is tenderness to palpation in the right upper quadrant with no guarding or rebound, negative murphy sign, no RLQ tenderness, ND, left sided CVA tenderness Exts: Non edematous BL  LE, warm and well perfused.    Assessment/Plan: Please see individual problem list.  Tommi Rumps, MD [place] PGY-3

## 2014-08-15 NOTE — Assessment & Plan Note (Addendum)
Patient with new onset of hematuria. UA micro significant for loaded WBCs and 2+ bacteria. Only occasional RBCs. Given history of kidney stones this would be a potential cause. Must also consider pyelonephritis given loaded WBCs and bacteria in urine. Patient is without gall bladder, appendix, and uterus making pathology in those areas an unlikely cause of her discomfort on exam. Will obtain a stone protocol CT abd/pelvis. Will obtain a CMET to ensure no liver abnormalities given RUQ pain. Will treat with levaquin for possible pyelonephritis. Urine culture sent. Given return precautions. Will call patient with results of tests.

## 2014-08-16 ENCOUNTER — Telehealth: Payer: Self-pay | Admitting: Family Medicine

## 2014-08-16 NOTE — Telephone Encounter (Signed)
Called patient to inform her that her CT scan did not reveal a stone. Her urine revealed >100,000 E coli. She is on levaquin at this time. Sensitivities have not returned at this time. I will call the patient back to inform her if the E coli is not sensitive to levaquin. She is to follow-up this Friday to ensure improvement in her symptoms. I discussed return precautions with her including fever, worsening pain, nausea, and vomiting. She voiced understanding.

## 2014-08-17 LAB — URINE CULTURE

## 2014-08-18 ENCOUNTER — Encounter: Payer: Self-pay | Admitting: Family Medicine

## 2014-08-18 ENCOUNTER — Ambulatory Visit (INDEPENDENT_AMBULATORY_CARE_PROVIDER_SITE_OTHER): Payer: Medicaid Other | Admitting: Family Medicine

## 2014-08-18 VITALS — BP 137/82 | HR 73 | Temp 98.1°F | Wt 121.0 lb

## 2014-08-18 DIAGNOSIS — K589 Irritable bowel syndrome without diarrhea: Secondary | ICD-10-CM

## 2014-08-18 DIAGNOSIS — M545 Low back pain, unspecified: Secondary | ICD-10-CM

## 2014-08-18 DIAGNOSIS — R112 Nausea with vomiting, unspecified: Secondary | ICD-10-CM

## 2014-08-18 DIAGNOSIS — R319 Hematuria, unspecified: Secondary | ICD-10-CM

## 2014-08-18 LAB — POCT URINALYSIS DIPSTICK
Bilirubin, UA: NEGATIVE
GLUCOSE UA: NEGATIVE
Ketones, UA: NEGATIVE
LEUKOCYTES UA: NEGATIVE
NITRITE UA: NEGATIVE
Protein, UA: NEGATIVE
Spec Grav, UA: 1.01
Urobilinogen, UA: 0.2
pH, UA: 6

## 2014-08-18 LAB — POCT UA - MICROSCOPIC ONLY

## 2014-08-18 MED ORDER — PROMETHAZINE HCL 12.5 MG PO TABS: 12.5000 mg | ORAL_TABLET | Freq: Four times a day (QID) | ORAL | Status: DC | PRN

## 2014-08-18 NOTE — Assessment & Plan Note (Signed)
Suspect this is likely the major player in her sx Explained to her that she also has diverticulosis Will tx with daily 1/2 cap miralax and stool softner Also plan for referral to Dr. Jenne Campus for FODMAPs diet suggestions

## 2014-08-18 NOTE — Progress Notes (Signed)
Patient ID: DIMA MINI, female   DOB: [birth year], 58 y.o.   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 Specifically, no evidence of nephrolithiasis or  ureteral stone.  Small hiatal hernia.  All relevant systems were reviewed and were negative unless otherwise noted in the HPI  Past Medical History Reviewed problem list.  Medications- reviewed and updated Current Outpatient Prescriptions  Medication Sig Dispense Refill  . albuterol (PROVENTIL HFA;VENTOLIN HFA) 108 (90 BASE) MCG/ACT inhaler Inhale 2 puffs into the lungs every 6 (six) hours as needed for wheezing.  8.5 g  5  . butalbital-acetaminophen-caffeine (FIORICET, ESGIC) 50-325-40 MG per tablet Take 1 tablet by mouth daily as needed. For migraines  30 tablet  1  . CALCIUM-MAGNESIUM-ZINC PO Take 1 tablet by mouth daily.       . clonazePAM (KLONOPIN) 0.5 MG tablet Take 1 tablet (0.5 mg total) by mouth 2 (two) times daily as needed.  60 tablet  2  . Coenzyme Q10 (CO Q-10 PO) Take 1 capsule by mouth daily.       . diclofenac sodium (VOLTAREN) 1 % GEL Apply 2 g topically 4 (four) times daily as needed.  100 g  5  . Ergocalciferol (VITAMIN D2) 2000 UNITS TABS Take 2,000 Units by mouth daily.      . fluconazole (DIFLUCAN) 150 MG tablet Take 1  tablet (150 mg total) by mouth once.  1 tablet  0  . HYDROcodone-acetaminophen (NORCO) 10-325 MG per tablet Take 1 tablet by mouth every 6 (six) hours as needed.  90 tablet  0  . hydrOXYzine (VISTARIL) 25 MG capsule Take 1 capsule (25 mg total) by mouth at bedtime as needed for itching.  60 capsule  2  . levofloxacin (LEVAQUIN) 750 MG tablet Take 1 tablet (750 mg total) by mouth daily.  7 tablet  0  . Multiple Vitamin (MULITIVITAMIN WITH MINERALS) TABS Take 1 tablet by mouth daily.      Marland Kitchen omeprazole-sodium bicarbonate (ZEGERID) 40-1100 MG per capsule Take one tab before breakfast and at bedtime  45 capsule  3  . ondansetron (ZOFRAN ODT) 8 MG disintegrating tablet Take 1 tablet (8 mg total) by mouth every 8 (eight) hours as needed for nausea.  20 tablet  0  . Probiotic Product (PROBIOTIC DAILY PO) Take 2 tablets by mouth daily.      . promethazine (PHENERGAN) 12.5 MG tablet Take 1 tablet (12.5 mg total) by mouth every 6 (six) hours as needed for nausea or vomiting.  30 tablet  0  . propranolol (INDERAL) 20 MG tablet Take 1 tablet (20 mg total) by mouth 2 (two) times daily.  60 tablet  11  . ranitidine (ZANTAC) 150 MG tablet Take 1 tablet (150 mg total) by mouth 2 (two)  times daily.  60 tablet  3  . simvastatin (ZOCOR) 40 MG tablet Take 1 tablet (40 mg total) by mouth at bedtime.  30 tablet  11  . triamterene-hydrochlorothiazide (MAXZIDE-25) 37.5-25 MG per tablet Take 1 tablet by mouth daily.  90 tablet  3  . TURMERIC PO Take 1 tablet by mouth 2 (two) times daily.       Marland Kitchen zolpidem (AMBIEN) 10 MG tablet Take 1 tablet (10 mg total) by mouth at bedtime as needed.  15 tablet  5   No current facility-administered medications for this visit.   Chief complaint-noted No additions to family history Social history- patient is a current every day smoker  Objective: BP 137/82  Pulse 73  Temp(Src) 98.1 F (36.7 C) (Oral)  Wt 121 lb (54.885 kg) Gen: NAD, alert, cooperative with exam HEENT: NCAT,  EOMI Neck: FROM, supple Abd: SNTND, BS present, no guarding or organomegaly  Assessment/Plan: See problem based a/p

## 2014-08-18 NOTE — Assessment & Plan Note (Signed)
Improved  Pan sensitive ecoli Will finish course of levaquin

## 2014-08-18 NOTE — Patient Instructions (Signed)
Ms Koffler it was great to meet you today!  I am sorry that you are not feeling well  I think a lot of these symptoms could be related to your IBS I am reassured that the infection looks improved Please call Dr. Jenne Campus to make an appointment for dietary management Continue to take 1/2-1 cap full of miralax a day along with daily colace to keep constipation in check  Please return to clinic if symptoms do not improve or worsen Feel better soon Bernadene Bell, MD

## 2014-09-08 ENCOUNTER — Encounter: Payer: Self-pay | Admitting: Family Medicine

## 2014-09-08 ENCOUNTER — Other Ambulatory Visit: Payer: Self-pay | Admitting: Family Medicine

## 2014-09-08 ENCOUNTER — Ambulatory Visit (INDEPENDENT_AMBULATORY_CARE_PROVIDER_SITE_OTHER): Payer: Medicaid Other | Admitting: Family Medicine

## 2014-09-08 VITALS — BP 139/87 | HR 72 | Temp 98.4°F | Ht 61.0 in | Wt 122.6 lb

## 2014-09-08 DIAGNOSIS — M549 Dorsalgia, unspecified: Secondary | ICD-10-CM

## 2014-09-08 DIAGNOSIS — J3489 Other specified disorders of nose and nasal sinuses: Secondary | ICD-10-CM

## 2014-09-08 DIAGNOSIS — J309 Allergic rhinitis, unspecified: Secondary | ICD-10-CM

## 2014-09-08 DIAGNOSIS — L298 Other pruritus: Secondary | ICD-10-CM

## 2014-09-08 DIAGNOSIS — G8929 Other chronic pain: Secondary | ICD-10-CM

## 2014-09-08 DIAGNOSIS — N898 Other specified noninflammatory disorders of vagina: Secondary | ICD-10-CM

## 2014-09-08 DIAGNOSIS — G2581 Restless legs syndrome: Secondary | ICD-10-CM

## 2014-09-08 LAB — POCT URINALYSIS DIPSTICK
Bilirubin, UA: NEGATIVE
Glucose, UA: NEGATIVE
Ketones, UA: NEGATIVE
Leukocytes, UA: NEGATIVE
Nitrite, UA: NEGATIVE
PH UA: 5.5
Protein, UA: NEGATIVE
RBC UA: NEGATIVE
UROBILINOGEN UA: 0.2

## 2014-09-08 MED ORDER — CLONAZEPAM 0.5 MG PO TABS: 0.5000 mg | ORAL_TABLET | Freq: Two times a day (BID) | ORAL | Status: DC | PRN

## 2014-09-08 MED ORDER — FLUCONAZOLE 150 MG PO TABS: 150.0000 mg | ORAL_TABLET | Freq: Once | ORAL | Status: DC

## 2014-09-08 MED ORDER — HYDROCODONE-ACETAMINOPHEN 10-325 MG PO TABS: 1.0000 | ORAL_TABLET | Freq: Four times a day (QID) | ORAL | Status: DC | PRN

## 2014-09-08 MED ORDER — CETIRIZINE HCL 10 MG PO TABS: 10.0000 mg | ORAL_TABLET | Freq: Every day | ORAL | Status: DC

## 2014-09-08 NOTE — Progress Notes (Signed)
Patient ID: Diana [name], female   DOB: August 18, 1956, 58 y.o.   MRN: [id number]   Subjective:   Diana [name] is a 58 y.o. female with a history of refill of medications, vaginal itching, and dry cough here for pain management.  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  Lumbar films in 2013 showing L4-L5 PLIF and right total hip arthroplasty and 2014 hip showing no fracture or dislocation.   Intensity/Location: lower back, right hip Quality: Back: ache, HIp: sharp,  Duration: Back since the 1990's. Hip since 2006.  Timing: consistent  Associated Sx: weakness/numbess radiating down right leg.  Radiating pain: none Worse/Better: hot tub helps, sitting and standing make worse   Effect of pain on sleep, appetite, mood, relationships, concentration, physical activity  Expected improvements with better pain control:   Any prescriptions by other providers: none.  Review of Systems:  HA, dizziness, vision or hearing changes, CP, SOB, abd pain, N/V/D, rashes, swollen joints are negative. Positive for constipation and urinary problems.    Past Medical History: Anxiety  Medications: reviewed and updated Current Outpatient Prescriptions  Medication Sig Dispense Refill  . albuterol (PROVENTIL HFA;VENTOLIN HFA) 108 (90 BASE) MCG/ACT inhaler Inhale 2 puffs into the lungs every 6 (six) hours as needed for wheezing.  8.5 g  5  . butalbital-acetaminophen-caffeine (FIORICET, ESGIC) 50-325-40 MG per tablet Take 1 tablet by mouth daily as needed. For migraines  30 tablet  1  . CALCIUM-MAGNESIUM-ZINC PO Take 1 tablet by mouth daily.       . clonazePAM (KLONOPIN) 0.5 MG tablet Take 1 tablet (0.5 mg total) by mouth 2 (two) times daily as needed.  60 tablet  2  . Coenzyme Q10 (CO Q-10 PO) Take 1 capsule by mouth daily.       . diclofenac sodium (VOLTAREN) 1 % GEL Apply 2 g topically 4 (four) times daily as needed.  100 g  5  . Ergocalciferol (VITAMIN D2) 2000  UNITS TABS Take 2,000 Units by mouth daily.      . fluconazole (DIFLUCAN) 150 MG tablet Take 1 tablet (150 mg total) by mouth once.  1 tablet  0  . HYDROcodone-acetaminophen (NORCO) 10-325 MG per tablet Take 1 tablet by mouth every 6 (six) hours as needed.  90 tablet  0  . hydrOXYzine (VISTARIL) 25 MG capsule Take 1 capsule (25 mg total) by mouth at bedtime as needed for itching.  60 capsule  2  . levofloxacin (LEVAQUIN) 750 MG tablet Take 1 tablet (750 mg total) by mouth daily.  7 tablet  0  . Multiple Vitamin (MULITIVITAMIN WITH MINERALS) TABS Take 1 tablet by mouth daily.      Marland Kitchen omeprazole-sodium bicarbonate (ZEGERID) 40-1100 MG per capsule Take one tab before breakfast and at bedtime  45 capsule  3  . ondansetron (ZOFRAN ODT) 8 MG disintegrating tablet Take 1 tablet (8 mg total) by mouth every 8 (eight) hours as needed for nausea.  20 tablet  0  . Probiotic Product (PROBIOTIC DAILY PO) Take 2 tablets by mouth daily.      . promethazine (PHENERGAN) 12.5 MG tablet Take 1 tablet (12.5 mg total) by mouth every 6 (six) hours as needed for nausea or vomiting.  30 tablet  0  . propranolol (INDERAL) 20 MG tablet Take 1 tablet (20 mg total) by mouth 2 (two) times daily.  60 tablet  11  . ranitidine (ZANTAC) 150 MG tablet Take 1  tablet (150 mg total) by mouth 2 (two) times daily.  60 tablet  3  . simvastatin (ZOCOR) 40 MG tablet Take 1 tablet (40 mg total) by mouth at bedtime.  30 tablet  11  . triamterene-hydrochlorothiazide (MAXZIDE-25) 37.5-25 MG per tablet Take 1 tablet by mouth daily.  90 tablet  3  . TURMERIC PO Take 1 tablet by mouth 2 (two) times daily.       Marland Kitchen zolpidem (AMBIEN) 10 MG tablet Take 1 tablet (10 mg total) by mouth at bedtime as needed.  15 tablet  5   No current facility-administered medications for this visit.    SH:  Lives with ex-husband.  substance abuse issues in the household? none Work history: disabled .  History of substance abuse: none, Alcohol, illicit drugs,  prescription drugs.   Current substance abuse: none Smoking history: 20 years. 0.5 PPD, recently cut down.   FH:  ADD  Objective:  Physical Exam: BP 139/87  Pulse 72  Temp(Src) 98.4 F (36.9 C) (Oral)  Ht 5\' 1"  (1.549 m)  Wt 122 lb 9.6 oz (55.611 kg)  BMI 23.18 kg/m2  Gen:  58 y.o. female in NAD HEENT: MMM, EOMI, anicteric sclerae CV: RRR, no MRG, no JVD Resp: Non-labored, CTAB, no wheezes noted MSK: No edema noted, full ROM with nocifensive response  Back:  Appearance: sciolosis no Palpation: tenderness of paraspinal muscles no, spinous process no; pelvis no  Flexion: limited  Extension: limited  Hip:  Appearance: normal  Palpation: tenderness of greater trochanter no Rotation Reduced: internal yes, external yes FABER: tight b/l  Neuro: Strength hip flexion 5/5, hip abduction 5/5, hip adduction 5/5,  knee extension 5/5, knee flexion 5/5, dorsiflexion 5/5, plantar flexion 5/5 Reflexes: patella 2/2 Bilateral   Sensation to light touch intact: yes Neuro: Alert and oriented, speech normal, antalgic gait  Assessment:     Diana [name] is a 58 y.o. female here for pain management.     Plan:     See problem list for problem-specific plans.  Chronic Pain Disorder:  - Progress toward pain control and promoting increased function:  yes - Current Non-narcotic therapies: none - Prescribed Norco 10 mg BID #60 expected to last one month. Morphine equivalent dose: 40 - Discussed goals of opioid treatment including: significant improvement in functional status and not necessarily pain resolution.  - Risks communicated including: nausea, vomiting, confusion, sleepiness, fatigue, constipation, worsening sleep apnea, impairment of operation of equipment or vehicles, possible long-term effects on neuro-endocrine system.  - Written pain agreement discussed and signed.  - UDS - Asking for early refills; frequent telephone calls or lost prescriptions no  - Narcotics from other health  providers: no - UDS reviewed: no - [place] drug database reviewed: no - Follow up in 1 month.

## 2014-09-08 NOTE — Patient Instructions (Signed)
Thank you for coming in,   We will call you with the results of your labs today.   Please follow up with me one in month.    Please feel free to call with any questions or concerns at any time, at [phone]. --Dr. Raeford Razor   Studies show opiates have only mild to moderate efficacy for chronic pain. Low doses are as likely to work as higher doses for chronic pain. High doses may have long term effects on the neuro-endocrine system.  - Our goal of treatment for you is to take the "edge" off your pain. Getting completely rid of your pain is not possible.  - Some rules are required for [place] to treat your pain with opiate pain medications (narcotics). 1. I will be the only prescriber of narcotic medications for you.  2. You should only use 1 pharmacy. 3. Medications will not be refilled early. 4. Refills require a clinic visit by appointment.  5. No urgent requests - appointments for refills must be requested at least 2 days in advance.  6. Lost or stolen scripts or medications cannot be refilled. They must be safe-guarded. 7. You may not use recreational drugs while on this medication, including marijuana, cocaine, and amphetamines.  8. Failure to follow these policies may result in discontinuation of pain medications.  If you agree to these conditions, please sign below indicating your understanding and agreement to comply with these conditions.

## 2014-09-09 LAB — DRUG SCR UR, PAIN MGMT, REFLEX CONF
Amphetamine Screen, Ur: NEGATIVE
Benzodiazepines.: NEGATIVE
CREATININE, U: 71.2 mg/dL
Cocaine Metabolites: NEGATIVE
MARIJUANA METABOLITE: NEGATIVE
Methadone: NEGATIVE
PROPOXYPHENE: NEGATIVE
Phencyclidine (PCP): NEGATIVE

## 2014-09-10 DIAGNOSIS — J309 Allergic rhinitis, unspecified: Secondary | ICD-10-CM | POA: Insufficient documentation

## 2014-09-10 DIAGNOSIS — N898 Other specified noninflammatory disorders of vagina: Secondary | ICD-10-CM | POA: Insufficient documentation

## 2014-09-10 NOTE — Assessment & Plan Note (Signed)
Recently completed course of ABX. Reports getting yeast infections after treatment of ABX. Has had a yeast infection before and this feels the same.  - Diflucan 150 mg x1

## 2014-09-10 NOTE — Assessment & Plan Note (Signed)
Chronic Pain Disorder:  - Progress toward pain control and promoting increased function:  yes - Current Non-narcotic therapies: none - Prescribed Norco 10 mg BID #60 expected to last one month. Morphine equivalent dose: 40 - Discussed goals of opioid treatment including: significant improvement in functional status and not necessarily pain resolution.  - Risks communicated including: nausea, vomiting, confusion, sleepiness, fatigue, constipation, worsening sleep apnea, impairment of operation of equipment or vehicles, possible long-term effects on neuro-endocrine system.  - Written pain agreement discussed and signed.  - UDS - Asking for early refills; frequent telephone calls or lost prescriptions no  - Narcotics from other health providers: no - UDS reviewed: no - [place] drug database reviewed: no - Follow up in 1 month.

## 2014-09-10 NOTE — Assessment & Plan Note (Signed)
She reports this is getting worse.  - refilled klonopin 0.5 BID #60  - consider trying alternative therapies instead of increasing klonopin.

## 2014-09-10 NOTE — Assessment & Plan Note (Signed)
Symptoms most likely related to allergies.  - zyrtec 10 mg daily  - doesn't want to try flonase.

## 2014-09-12 LAB — OPIATES/OPIOIDS (LC/MS-MS)
Codeine Urine: NEGATIVE ng/mL (ref <50)
Hydrocodone: 81 ng/mL — ABNORMAL HIGH (ref <50)
Hydromorphone: 91 ng/mL — ABNORMAL HIGH (ref <50)
Morphine Urine: NEGATIVE ng/mL (ref <50)
NORHYDROCODONE, UR: 258 ng/mL — AB (ref <50)
NOROXYCODONE, UR: NEGATIVE ng/mL (ref <50)
OXYCODONE, UR: NEGATIVE ng/mL (ref <50)
Oxymorphone: NEGATIVE ng/mL (ref <50)

## 2014-09-12 LAB — BARBITURATES (GC/LC/MS), URINE
Amobarbital: NEGATIVE ng/mL (ref <100)
BUTALBITAL GC/MS, URINE: 1051 ng/mL — AB (ref <100)
PHENOBARBITAL GC/MS, URINE: NEGATIVE ng/mL (ref <100)
Pentabarbital: NEGATIVE ng/mL (ref <100)
Secobarbital: NEGATIVE ng/mL (ref <100)

## 2014-09-21 ENCOUNTER — Ambulatory Visit (INDEPENDENT_AMBULATORY_CARE_PROVIDER_SITE_OTHER): Payer: Medicaid Other | Admitting: Family Medicine

## 2014-09-21 VITALS — BP 120/82 | HR 85 | Temp 98.2°F | Wt 122.8 lb

## 2014-09-21 DIAGNOSIS — Z90710 Acquired absence of both cervix and uterus: Secondary | ICD-10-CM

## 2014-09-21 DIAGNOSIS — N952 Postmenopausal atrophic vaginitis: Secondary | ICD-10-CM

## 2014-09-21 DIAGNOSIS — N760 Acute vaginitis: Secondary | ICD-10-CM

## 2014-09-21 LAB — POCT WET PREP (WET MOUNT): CLUE CELLS WET PREP WHIFF POC: NEGATIVE

## 2014-09-21 MED ORDER — TERCONAZOLE 0.4 % VA CREA: 1.0000 | TOPICAL_CREAM | Freq: Every day | VAGINAL | Status: DC

## 2014-09-21 MED ORDER — ESTRADIOL 0.1 MG/GM VA CREA: 1.0000 | TOPICAL_CREAM | VAGINAL | Status: DC

## 2014-09-22 DIAGNOSIS — N952 Postmenopausal atrophic vaginitis: Secondary | ICD-10-CM

## 2014-09-22 DIAGNOSIS — Z90710 Acquired absence of both cervix and uterus: Secondary | ICD-10-CM | POA: Insufficient documentation

## 2014-09-22 HISTORY — DX: Postmenopausal atrophic vaginitis: N95.2

## 2014-09-22 HISTORY — DX: Acquired absence of both cervix and uterus: Z90.710

## 2014-09-22 NOTE — Progress Notes (Signed)
Patient ID: Diana [name], female   DOB: 1956/05/24, 58 y.o.   MRN: [id number] Here with complaint of dyspareunia for the last 2-3 weeks. She had a yeast infection which she says was treated with the Diflucan pill. Symptoms improved but did not totally go away. She's also had some vaginal dryness for the last several months and since the yeast infection that has seemed to be more of a problem causing her to be unable to have intercourse. She is postmenopausal. Other than yeast infection she's had no unusual vaginal discharge, no vaginal bleeding. She's had no dysuria. OBJECTIVE: Well-developed female no acute distress GU: Externally somewhat atrophic. There is no discharge. There are no lesions. A full bimanual was not done secondary to patient discomfort. I did perform a wet prep. ABDOMEN: Soft, positive bowel sounds nontender nondistended   ASSESSMENT: Postmenopausal vulva vaginitis which I think is a combination of atrophic vaginitis with probable untreated candidate for laparotomy infection. We'll treat her with 7 days of Terazol vaginal cream and after that we'll start her on 3 times a week estradiol vaginal cream

## 2014-09-27 ENCOUNTER — Telehealth: Payer: Self-pay | Admitting: *Deleted

## 2014-09-27 NOTE — Telephone Encounter (Signed)
Medication changed to Premarin vaginal cream; verbal order given by Dr. Mingo Amber.  Derl Barrow, RN

## 2014-09-27 NOTE — Telephone Encounter (Signed)
Please change vaginal cream to Premarin vaginal cream.  Estrace is not covered under medicaid.  Derl Barrow, RN

## 2014-10-09 ENCOUNTER — Encounter: Payer: Self-pay | Admitting: Family Medicine

## 2014-10-09 ENCOUNTER — Ambulatory Visit (INDEPENDENT_AMBULATORY_CARE_PROVIDER_SITE_OTHER): Payer: Medicaid Other | Admitting: Family Medicine

## 2014-10-09 ENCOUNTER — Telehealth: Payer: Self-pay | Admitting: Family Medicine

## 2014-10-09 ENCOUNTER — Ambulatory Visit
Admission: RE | Admit: 2014-10-09 | Discharge: 2014-10-09 | Disposition: A | Discharge status: Home / Self Care | Payer: Medicaid Other | Source: Ambulatory Visit | Attending: Family Medicine | Admitting: Family Medicine

## 2014-10-09 VITALS — BP 140/78 | HR 116 | Temp 98.6°F | Ht 61.0 in | Wt 131.0 lb

## 2014-10-09 DIAGNOSIS — R059 Cough, unspecified: Secondary | ICD-10-CM

## 2014-10-09 DIAGNOSIS — J449 Chronic obstructive pulmonary disease, unspecified: Secondary | ICD-10-CM

## 2014-10-09 DIAGNOSIS — R05 Cough: Secondary | ICD-10-CM

## 2014-10-09 MED ORDER — BENZONATATE 100 MG PO CAPS: 100.0000 mg | ORAL_CAPSULE | Freq: Two times a day (BID) | ORAL | Status: DC | PRN

## 2014-10-09 MED ORDER — AZITHROMYCIN 250 MG PO TABS: ORAL_TABLET | ORAL | Status: DC

## 2014-10-09 MED ORDER — PREDNISONE 20 MG PO TABS: 40.0000 mg | ORAL_TABLET | Freq: Every day | ORAL | Status: DC

## 2014-10-09 MED ORDER — ALBUTEROL SULFATE HFA 108 (90 BASE) MCG/ACT IN AERS: 2.0000 | INHALATION_SPRAY | Freq: Four times a day (QID) | RESPIRATORY_TRACT | Status: DC | PRN

## 2014-10-09 NOTE — Assessment & Plan Note (Signed)
Cough productive of sputum in a smoker brings diagnosis of COPD exacerbation in to question, though patient does not appear to have a formal diagnosis of this issue. O2 sat in normal range on ambulation. CXR with possible mild lingular PNA vs atelectasis. Will treat with prednisone 40 mg daily for 5 days, azithromycin for 5 days, albuterol q6 hrs for the next 2 days, and tessalon for cough. Will likely need PFTs once over this illness. Given return precautions. She will follow-up with her PCP this Friday.

## 2014-10-09 NOTE — Progress Notes (Signed)
Patient ID: AXEL MEAS, female   DOB: 02-05-1956, 58 y.o.   MRN: [id number]  Tommi Rumps, MD Phone: [phone]  LAYAL JAVID is a 58 y.o. female who presents today for same day appointment.   Cough: reports productive cough and wheezing since Thursday. White sputum. Notes some mild shortness of breath with cough, though no chest pain. She endorses congestion and post nasal drip. Has had fever to 102 F last night. Has been using albuterol 3x/day. Smoked 1/2 PPD for 20 years. States has not been formally diagnosed with COPD.   Patient is a smoker.   ROS: Per HPI   Physical Exam Filed Vitals:   10/09/14 1419  BP: 140/78  Pulse:   Temp:   Walking O2 sat 92%  Gen: NAD, coughing intermittently HEENT: PERRL,  MMM, bilateral TMs normal, mild OP erythema Lungs: mild wheezing, no rales noted Nl WOB Heart: RRR no MRG Exts: Non edematous BL  LE, warm and well perfused.    Assessment/Plan: Please see individual problem list.  Tommi Rumps, MD [facility] PGY-3

## 2014-10-09 NOTE — Patient Instructions (Signed)
Nice to see you. We will start you on prednisone, azithromycin, and tessalon for your cough. It is likely that you are having a COPD exacerbation. Once you are over this acute illness we will have you get breathing testing.  If you develop fevers, worsening shortness of breath, cough up blood, have chest pain, or have fevers please seek medical attention.

## 2014-10-09 NOTE — Progress Notes (Signed)
Pt reports 102 fever yesterday. No flu shot today. Diana [name], [name]

## 2014-10-09 NOTE — Telephone Encounter (Signed)
Advised patient of CXR findings. She is already being covered for a possible PNA with azithromycin.

## 2014-10-13 ENCOUNTER — Ambulatory Visit (INDEPENDENT_AMBULATORY_CARE_PROVIDER_SITE_OTHER): Payer: Medicaid Other | Admitting: Family Medicine

## 2014-10-13 ENCOUNTER — Encounter: Payer: Self-pay | Admitting: Family Medicine

## 2014-10-13 ENCOUNTER — Ambulatory Visit (INDEPENDENT_AMBULATORY_CARE_PROVIDER_SITE_OTHER): Payer: Medicaid Other | Admitting: *Deleted

## 2014-10-13 VITALS — BP 157/77 | HR 98 | Temp 98.2°F | Ht 61.0 in | Wt 132.0 lb

## 2014-10-13 DIAGNOSIS — Z23 Encounter for immunization: Secondary | ICD-10-CM

## 2014-10-13 DIAGNOSIS — G2581 Restless legs syndrome: Secondary | ICD-10-CM

## 2014-10-13 DIAGNOSIS — M549 Dorsalgia, unspecified: Secondary | ICD-10-CM

## 2014-10-13 DIAGNOSIS — G8929 Other chronic pain: Secondary | ICD-10-CM

## 2014-10-13 DIAGNOSIS — Z720 Tobacco use: Secondary | ICD-10-CM

## 2014-10-13 DIAGNOSIS — R05 Cough: Secondary | ICD-10-CM

## 2014-10-13 DIAGNOSIS — R059 Cough, unspecified: Secondary | ICD-10-CM

## 2014-10-13 DIAGNOSIS — F17209 Nicotine dependence, unspecified, with unspecified nicotine-induced disorders: Secondary | ICD-10-CM

## 2014-10-13 MED ORDER — MONTELUKAST SODIUM 10 MG PO TABS: 10.0000 mg | ORAL_TABLET | Freq: Every day | ORAL | Status: DC

## 2014-10-13 MED ORDER — HYDROCODONE-ACETAMINOPHEN 10-325 MG PO TABS: 1.0000 | ORAL_TABLET | Freq: Four times a day (QID) | ORAL | Status: DC | PRN

## 2014-10-13 MED ORDER — CLONAZEPAM 0.5 MG PO TABS: 0.5000 mg | ORAL_TABLET | Freq: Two times a day (BID) | ORAL | Status: DC | PRN

## 2014-10-13 MED ORDER — FLUTICASONE PROPIONATE 50 MCG/ACT NA SUSP: 2.0000 | Freq: Every day | NASAL | Status: DC

## 2014-10-13 MED ORDER — PREDNISONE 20 MG PO TABS
40.0000 mg | ORAL_TABLET | Freq: Every day | ORAL | Status: DC
Start: 2014-10-13 — End: 2014-10-20

## 2014-10-13 NOTE — Assessment & Plan Note (Addendum)
No change with pain. Still occuring. Exercises have improved her symptoms. Still relying on her medication to perform normal ADL's  - refilled norco  - UDS not performed today, will need on f/u  - continue home exercises.  - may need to consider hip or lumbar x-rays. Could consider injections or referral for injections  - if SI joint dysfunction manipulation or injections are best treatment  - encourage to add tylenol to supplement pain management  - continue to try to wean

## 2014-10-13 NOTE — Assessment & Plan Note (Addendum)
Cough still present but no SOB with ambulating and oxygenating well. Worse at night. Still wheezing on exam. Doesn't report the use of oral steroid more than maybe once per year if that. Pack year history between 10-20  - continue prednisone with additional Rx for prednisone 40 mg for 5 days  - albuterol scheduled at night but PRN during the day  - referral to Dr. Valentina Lucks for PFT's  - Singulair started today for any allergy component.  - flonase added for any sinus  - will call if still having problems in 10-14 days. May need to

## 2014-10-13 NOTE — Progress Notes (Signed)
Subjective:    Patient ID: Diana [name], female    DOB: 07/02/1956, 58 y.o.   MRN: [id number]  HPI [name] is here for f/u for PNA and chronic pain.  Pain Follow Up  Location: lower back/hip  Symptoms: achy in back and sharp in hip  Duration: Back since the 1990's. Hip since 2006.  Current Treatment/Recent Course: Norco 10 mg TID  Character 9/10, shooting and stabbing in nature  Analgesia 6/10 Activity Increased: Yes - the ability to have normal function  Aberant Behavior: No Adverse Reaction: No Recent Imaging: No Has been doing home exercises with some improvement of her symptoms.   Tobacco abuse: still smoking at least 0.5 PPD. Smoking for the past 20 years. Has quit before with chantix but insurance doesn't cover it and started again. No one else smokes in the house. She has tried gum and patches with no help. 6/10 in terms of level of importance of quitting.   COPD exacerbation vs PNA. Recently seen on Monday and thought to have PNA and possible COPD exacerbation. She has no formal diagnosis of COPD. This was based off her exam and history of smoking. She has history of asthma. She was given prednisone 40 mg daily, ABX and using albuterol Q6. Her cough is still present and worse at night. She denies any fevers or chills. Tessalon hasn't been helping her cough. Cough has been white mucous production.   Current Outpatient Prescriptions on File Prior to Visit  Medication Sig Dispense Refill  . albuterol (PROVENTIL HFA;VENTOLIN HFA) 108 (90 BASE) MCG/ACT inhaler Inhale 2 puffs into the lungs every 6 (six) hours as needed for wheezing. 8.5 g 5  . azithromycin (ZITHROMAX) 250 MG tablet Take 500 mg (2 tablets) on day 1, take 250 mg (one tablet) days 2-5 6 tablet 0  . benzonatate (TESSALON) 100 MG capsule Take 1 capsule (100 mg total) by mouth 2 (two) times daily as needed for cough. 20 capsule 0  . butalbital-acetaminophen-caffeine (FIORICET, ESGIC) 50-325-40 MG per tablet Take 1  tablet by mouth daily as needed. For migraines 30 tablet 1  . CALCIUM-MAGNESIUM-ZINC PO Take 1 tablet by mouth daily.     . cetirizine (ZYRTEC) 10 MG tablet Take 1 tablet (10 mg total) by mouth daily. 30 tablet 1  . clonazePAM (KLONOPIN) 0.5 MG tablet Take 1 tablet (0.5 mg total) by mouth 2 (two) times daily as needed. 60 tablet 0  . Coenzyme Q10 (CO Q-10 PO) Take 1 capsule by mouth daily.     . diclofenac sodium (VOLTAREN) 1 % GEL Apply 2 g topically 4 (four) times daily as needed. 100 g 5  . Ergocalciferol (VITAMIN D2) 2000 UNITS TABS Take 2,000 Units by mouth daily.    Marland Kitchen estradiol (ESTRACE) 0.1 MG/GM vaginal cream Place 1 Applicatorful vaginally 3 (three) times a week. 42.5 g 12  . fluconazole (DIFLUCAN) 150 MG tablet Take 1 tablet (150 mg total) by mouth once. 1 tablet 0  . HYDROcodone-acetaminophen (NORCO) 10-325 MG per tablet Take 1 tablet by mouth every 6 (six) hours as needed. 80 tablet 0  . hydrOXYzine (VISTARIL) 25 MG capsule Take 1 capsule (25 mg total) by mouth at bedtime as needed for itching. 60 capsule 2  . Multiple Vitamin (MULITIVITAMIN WITH MINERALS) TABS Take 1 tablet by mouth daily.    Marland Kitchen omeprazole-sodium bicarbonate (ZEGERID) 40-1100 MG per capsule Take one tab before breakfast and at bedtime 45 capsule 3  . predniSONE (DELTASONE) 20 MG tablet  Take 2 tablets (40 mg total) by mouth daily with breakfast. 10 tablet 0  . Probiotic Product (PROBIOTIC DAILY PO) Take 2 tablets by mouth daily.    . promethazine (PHENERGAN) 12.5 MG tablet Take 1 tablet (12.5 mg total) by mouth every 6 (six) hours as needed for nausea or vomiting. 30 tablet 0  . propranolol (INDERAL) 20 MG tablet Take 1 tablet (20 mg total) by mouth 2 (two) times daily. 60 tablet 11  . ranitidine (ZANTAC) 150 MG tablet Take 1 tablet (150 mg total) by mouth 2 (two) times daily. 60 tablet 3  . simvastatin (ZOCOR) 40 MG tablet Take 1 tablet (40 mg total) by mouth at bedtime. 30 tablet 11  . terconazole (TERAZOL 7) 0.4 %  vaginal cream Place 1 applicator vaginally at bedtime. 45 g 1  . triamterene-hydrochlorothiazide (MAXZIDE-25) 37.5-25 MG per tablet Take 1 tablet by mouth daily. 90 tablet 3  . TURMERIC PO Take 1 tablet by mouth 2 (two) times daily.     Marland Kitchen zolpidem (AMBIEN) 10 MG tablet Take 1 tablet (10 mg total) by mouth at bedtime as needed. 15 tablet 5   No current facility-administered medications on file prior to visit.    SHx: still smoking.   Health Maintenance: Need flu vaccine   Review of Systems See HPI     Objective:   Physical Exam BP 157/77 mmHg  Pulse 98  Temp(Src) 98.2 F (36.8 C) (Oral)  Ht 5\' 1"  (1.549 m)  Wt 132 lb (59.875 kg)  BMI 24.95 kg/m2  SpO2 100% Gen: NAD, alert, cooperative with exam,  HEENT: NCAT, EOMI, clear conjunctiva, oropharynx clear,  CV: RRR, good S1/S2, no murmur, no edema,  Resp: exp wheezing, rhonchorus, cough developed with deep breathing  MSK: neurovasculary intact  Lower ext: 5/5 strength with flexion and extension,  Ambulating with cane        Assessment & Plan:

## 2014-10-13 NOTE — Assessment & Plan Note (Signed)
Continued use. Wants to quit but no set date. Has previously quit with chantix before but unable to afford it due to insurance. Has reasons to quit such as grandchildren  - continue to encourage to quit  - may address bupropion in future if set date is within 7-12 weeks.

## 2014-10-13 NOTE — Patient Instructions (Signed)
Thank you for coming in,   I have sent in another five days of prednisone. I have also started a medication called Singulair that will help with your cough/breathing.   Please make an appointment with Dr. Valentina Lucks to have your pulmonary function test completed.   Use your albterol inhaler every 6 hours at night but only as needed during the day.   Please follow up with me in 10-14 days if you are not improved.    Please feel free to call with any questions or concerns at any time, at [phone]. --Dr. Raeford Razor

## 2014-10-20 ENCOUNTER — Encounter: Payer: Self-pay | Admitting: Pharmacist

## 2014-10-20 ENCOUNTER — Ambulatory Visit (INDEPENDENT_AMBULATORY_CARE_PROVIDER_SITE_OTHER): Payer: Medicaid Other | Admitting: Pharmacist

## 2014-10-20 VITALS — BP 142/87 | HR 89 | Ht 62.0 in | Wt 128.0 lb

## 2014-10-20 DIAGNOSIS — R05 Cough: Secondary | ICD-10-CM

## 2014-10-20 DIAGNOSIS — F17209 Nicotine dependence, unspecified, with unspecified nicotine-induced disorders: Secondary | ICD-10-CM

## 2014-10-20 DIAGNOSIS — J449 Chronic obstructive pulmonary disease, unspecified: Secondary | ICD-10-CM

## 2014-10-20 DIAGNOSIS — R059 Cough, unspecified: Secondary | ICD-10-CM

## 2014-10-20 DIAGNOSIS — Z720 Tobacco use: Secondary | ICD-10-CM

## 2014-10-20 DIAGNOSIS — G43109 Migraine with aura, not intractable, without status migrainosus: Secondary | ICD-10-CM

## 2014-10-20 MED ORDER — NORTRIPTYLINE HCL 50 MG PO CAPS: 50.0000 mg | ORAL_CAPSULE | Freq: Every day | ORAL | Status: DC

## 2014-10-20 MED ORDER — BENZONATATE 100 MG PO CAPS: 100.0000 mg | ORAL_CAPSULE | Freq: Two times a day (BID) | ORAL | Status: DC | PRN

## 2014-10-20 NOTE — Patient Instructions (Addendum)
Thank you for visiting [place]!  The pulmonary function test indicates there is significant restriction in lungs.  We are glad you are willing to stop smoking.    Today we are going to begin Nortriptyline 50 mg daily at bedtime.  This medication can help with you quit smoking as well as sleep.  In addition, we will also start the nicotine patches  If you would like additional assistance for quitting smoking, please call 1-800-QUITNOW.      We will call you in 7-10 days to see how you are doing!

## 2014-10-20 NOTE — Assessment & Plan Note (Signed)
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 Patient has Medicaid and Chantix not covered. Initiated nicotine replacement patches 14 mg daily. Patient counseled on purpose, proper use, disposal and potential adverse effects, including skin irritation and sleep disturbances. Counseled to remove the patch at night if nightmares occur and to not smoke while wearing patches. Also initiated nortriptyline 50 mg every night for smoking cessation. Patient counseled on purpose, proper use, and potential adverse effects, somnolence and anticholinergic side effects. Will consider nicotine lozenges if patient needs further therapy.

## 2014-10-20 NOTE — Progress Notes (Signed)
S:    Patient arrives troubled by her breathing condition and recent episode of pneumonia (finished course of antibiotics). Patient presents today for lung function evaluation.   Patient reports breathing has been worsened over the past 4 weeks.  Patient is experiencing coughing and wheezing that worsens at night. Patient is currently on albuterol inhaler but reports little improvement. She reports sleeping in a recliner at night due to her difficulty sleeping.   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 She also reports exposure to bleach fumes as she uses it to clean.   Patient is smoker (0.5 ppd) for the past 30 years.  In late 2013, she had quit for about 2 years successfully with chantix but due to martial stress she began smoking again.  In addition, reports insurance does not cover it.  Has tried gum and patches but did not work for her. She is very interested in using Chantix again if insurance will cover it.   Age when started using tobacco on a daily basis 27. Number of Cigarettes per day 10-20. Smokes first cigarette minutes after waking. Denies waking to smoke  Most recent quit attempt 2 years ago (late 2013) Longest time ever been tobacco free 2 years. What Medications (NRT, bupropion, varenicline) used in past includes NRT (gum and patch), bupropion, and varenicline.  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  See "scanned report" or Documentation Flowsheet (discrete results - PFTs) for  Spirometry results. Patient provided good effort while attempting spirometry.  Albuterol Neb:  Lot [id number] Exp: July/2017  Lung Age = 57 years  A/P: Cough: Spirometry evaluation reveals Moderate restrictive lung disease. Post nebulized  albuterol tx revealed no significant improvement in FVC or FEV1.  Patient has been experiencing coughing for 3-4 weeks and taking albuterol as needed and benzonatate. No changes to respiratory medications at this time but benzonatate was reordered.  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 Patient has Medicaid and Chantix not covered. Initiated nicotine replacement patches 14 mg daily. Patient counseled on purpose, proper use, disposal and potential adverse effects, including skin irritation and sleep disturbances. Counseled to remove the patch at night if nightmares occur and to not smoke while wearing patches. Also initiated nortriptyline 50 mg every night for smoking cessation. Patient counseled on purpose, proper use, and potential adverse effects, somnolence and anticholinergic side effects. Will consider nicotine lozenges if patient needs further therapy.  Written information provided. Provided information on 1 800-QUIT NOW support program.  F/U phone call in 7-10 days.  F/U Rx Clinic Visit as needed.   Total time in face-to-face counseling 30 minutes.  Patient seen with Angela Burke, PharmD Candidate; Hassie Bruce,  PharmD Resident, and Nicoletta Ba, PharmD Resident.

## 2014-10-20 NOTE — Assessment & Plan Note (Signed)
Cough: Spirometry evaluation reveals Moderate restrictive lung disease. Post nebulized albuterol tx revealed no significant improvement in FVC or FEV1.  Patient has been experiencing coughing for 3-4 weeks and taking albuterol as needed and benzonatate. No changes to respiratory medications at this time but benzonatate was reordered.

## 2014-10-23 NOTE — Progress Notes (Signed)
Patient ID: Diana [name], female   DOB: 09/11/1956, 58 y.o.   MRN: [id number] Reviewed: Agree with Dr. Graylin Shiver documentation and management.

## 2014-10-25 ENCOUNTER — Other Ambulatory Visit: Payer: Self-pay | Admitting: Family Medicine

## 2014-10-25 DIAGNOSIS — G2581 Restless legs syndrome: Secondary | ICD-10-CM

## 2014-10-25 MED ORDER — HYDROXYZINE PAMOATE 25 MG PO CAPS: 25.0000 mg | ORAL_CAPSULE | Freq: Every evening | ORAL | Status: DC | PRN

## 2014-10-25 NOTE — Telephone Encounter (Signed)
Patient getting Vistaril for rash due to TCA. On Nortriptyline but need to address if she has an allergy to this and if it needs to be stopped. Reported that she cannot take Benadryl.   Refilled vistaril 25 mg.   Rosemarie Ax, MD PGY-2, [facility] 10/25/2014, 12:11 PM

## 2014-10-30 ENCOUNTER — Other Ambulatory Visit: Payer: Self-pay | Admitting: *Deleted

## 2014-11-01 MED ORDER — DICLOFENAC SODIUM 1 % TD GEL: 2.0000 g | Freq: Four times a day (QID) | TRANSDERMAL | Status: DC | PRN

## 2014-11-02 ENCOUNTER — Ambulatory Visit (INDEPENDENT_AMBULATORY_CARE_PROVIDER_SITE_OTHER): Payer: Medicaid Other | Admitting: Family Medicine

## 2014-11-02 ENCOUNTER — Encounter: Payer: Self-pay | Admitting: Family Medicine

## 2014-11-02 ENCOUNTER — Telehealth: Payer: Self-pay | Admitting: *Deleted

## 2014-11-02 ENCOUNTER — Ambulatory Visit
Admission: RE | Admit: 2014-11-02 | Discharge: 2014-11-02 | Disposition: A | Discharge status: Home / Self Care | Payer: Medicaid Other | Source: Ambulatory Visit | Attending: Family Medicine | Admitting: Family Medicine

## 2014-11-02 VITALS — BP 145/79 | HR 105 | Temp 98.1°F | Ht 61.0 in | Wt 123.0 lb

## 2014-11-02 DIAGNOSIS — R0602 Shortness of breath: Secondary | ICD-10-CM

## 2014-11-02 DIAGNOSIS — R05 Cough: Secondary | ICD-10-CM

## 2014-11-02 DIAGNOSIS — R059 Cough, unspecified: Secondary | ICD-10-CM

## 2014-11-02 MED ORDER — LEVOFLOXACIN 500 MG PO TABS: 500.0000 mg | ORAL_TABLET | Freq: Every day | ORAL | Status: DC

## 2014-11-02 MED ORDER — HYDROCODONE-HOMATROPINE 5-1.5 MG/5ML PO SYRP: 5.0000 mL | ORAL_SOLUTION | Freq: Three times a day (TID) | ORAL | Status: DC | PRN

## 2014-11-02 MED ORDER — PROMETHAZINE HCL 12.5 MG RE SUPP: 12.5000 mg | Freq: Four times a day (QID) | RECTAL | Status: DC | PRN

## 2014-11-02 NOTE — Patient Instructions (Signed)
It was nice to see you today.  Please get your xray asap.  Take the antibiotic as prescribed.  Follow up if you worsen or fail to improve.  Take care  Dr. Lacinda Axon

## 2014-11-02 NOTE — Telephone Encounter (Signed)
Received a call from [place] regarding pt's Rx for promethazine (PHENERGAN) 12.5 MG suppository.  Medication is back order; no pharmacy has it.  Ok to change to the 25 mg which [facility] has in stock.  Please send new Rx for the 25mg  is change.  Derl Barrow, RN

## 2014-11-02 NOTE — Progress Notes (Signed)
   Subjective:    Patient ID: Diana [name], female    DOB: 1956-11-14, 58 y.o.   MRN: [id number]  HPI 58 year old female who is a current every day smoker presents today with complaints of continued cough, shortness of breath, and recent fever.  Patient was seen on 11/9 with similar complaints. At that time given her physical exam and history of smoking she was diagnosed with a COPD exacerbation. Chest x-ray was obtained and revealed lingular atelectasis versus infiltrate. She was treated with prednisone 40 mg daily 5 days as well as a five-day course of azithromycin. She was subsequently seen for follow-up with her primary care physician on 11/13. At that time she was given an additional prescription for a five-day course of prednisone. Her symptoms are also treated with Singulair and Flonase.  Patient presents today with continued complaints of cough and associated shortness of breath. She states that her cough is productive (yellow sputum).  She's been experiencing shortness of breath particularly with the cough. She denies shortness of breath with exertion. Additionally, she reports that she has had intermittent fever over the past 4 days.  Her last temperature was last night and was 102.7 orally.  She reports that she's had no improvement with the above treatments.  She states that she feels awful at this time.   Of note, patient had spirometry done on 11/20 which revealed findings consistent with moderate restrictive lung disease.  Social Hx - Current every day smoker; recently cut back to 1/4 PPD.  Review of Systems Per HPI with the following additions: Patient endorses nausea and vomiting (appears to be more posttussive). No reported lower extremity edema.      Objective:   Physical Exam Filed Vitals:   11/02/14 1047  BP: 145/79  Pulse: 105  Temp: 98.1 F (36.7 C)   Exam: General: Chronically ill-appearing female, appears older than stated age; no acute distress. HEENT: NCAT.   TMs with sclerotic changes bilaterally. No erythema. Oropharynx clear. Cardiovascular: Tachycardic. Regular rhythm. No murmurs. Respiratory: Diffuse wheezing noted. Scattered rhonchi also noted. No increased work of breathing. Abdomen: soft, nontender, nondistended. Extremities: No LE edema.    Assessment & Plan:  See problem list.

## 2014-11-02 NOTE — Assessment & Plan Note (Addendum)
Ongoing issue for the patient. Recent office visits and prior chest x-ray reviewed today. Given history and physical exam findings, I'm obtaining chest x-ray today to further evaluate.  I also started the patient on empiric Levaquin 500 mg daily 7 days to cover for community acquired pneumonia in the setting of underlying lung disease (may not have been covered by prior antibiotic).  I'm treating the cough with Hycodan as patient did not respond to [employer] Regarding her recent nausea and vomiting, patient requested Phenergan suppositories. Rx was sent electronically today. Return precautions given. Patient to follow closely with PCP. Patient to call she fails to improve or worsens.

## 2014-11-03 MED ORDER — PROMETHAZINE HCL 25 MG RE SUPP: 25.0000 mg | Freq: Four times a day (QID) | RECTAL | Status: DC | PRN

## 2014-11-03 NOTE — Telephone Encounter (Signed)
Rx for Phenergan suppository 25 mg sent in.

## 2014-11-17 ENCOUNTER — Telehealth: Payer: Self-pay | Admitting: Pharmacist

## 2014-11-17 NOTE — Telephone Encounter (Signed)
Called patient to follow up on smoking cessation status. She was started on nortriptyline and nicotine patches.   Of note, earlier this month, patient saw Dr. Lacinda Axon for possible community-acquired pneumonia.   She was unable to answer but a family member took my name and number to call me back.  Will follow up next week if I do not hear from her.

## 2014-11-25 ENCOUNTER — Emergency Department (HOSPITAL_COMMUNITY)
Admission: EM | Admit: 2014-11-25 | Discharge: 2014-11-25 | Disposition: A | Discharge status: Home / Self Care | Payer: Medicaid Other | Attending: Emergency Medicine | Admitting: Emergency Medicine

## 2014-11-25 ENCOUNTER — Emergency Department (HOSPITAL_COMMUNITY): Payer: Medicaid Other

## 2014-11-25 ENCOUNTER — Encounter (HOSPITAL_COMMUNITY): Payer: Self-pay | Admitting: Family Medicine

## 2014-11-25 DIAGNOSIS — Y9289 Other specified places as the place of occurrence of the external cause: Secondary | ICD-10-CM | POA: Insufficient documentation

## 2014-11-25 DIAGNOSIS — W109XXA Fall (on) (from) unspecified stairs and steps, initial encounter: Secondary | ICD-10-CM | POA: Insufficient documentation

## 2014-11-25 DIAGNOSIS — S4991XA Unspecified injury of right shoulder and upper arm, initial encounter: Secondary | ICD-10-CM | POA: Insufficient documentation

## 2014-11-25 DIAGNOSIS — Z79899 Other long term (current) drug therapy: Secondary | ICD-10-CM | POA: Insufficient documentation

## 2014-11-25 DIAGNOSIS — E780 Pure hypercholesterolemia: Secondary | ICD-10-CM | POA: Insufficient documentation

## 2014-11-25 DIAGNOSIS — M069 Rheumatoid arthritis, unspecified: Secondary | ICD-10-CM | POA: Diagnosis not present

## 2014-11-25 DIAGNOSIS — Z7951 Long term (current) use of inhaled steroids: Secondary | ICD-10-CM | POA: Diagnosis not present

## 2014-11-25 DIAGNOSIS — K219 Gastro-esophageal reflux disease without esophagitis: Secondary | ICD-10-CM | POA: Insufficient documentation

## 2014-11-25 DIAGNOSIS — F419 Anxiety disorder, unspecified: Secondary | ICD-10-CM | POA: Insufficient documentation

## 2014-11-25 DIAGNOSIS — Z8619 Personal history of other infectious and parasitic diseases: Secondary | ICD-10-CM | POA: Insufficient documentation

## 2014-11-25 DIAGNOSIS — G43909 Migraine, unspecified, not intractable, without status migrainosus: Secondary | ICD-10-CM | POA: Diagnosis not present

## 2014-11-25 DIAGNOSIS — Y998 Other external cause status: Secondary | ICD-10-CM | POA: Insufficient documentation

## 2014-11-25 DIAGNOSIS — S6990XA Unspecified injury of unspecified wrist, hand and finger(s), initial encounter: Secondary | ICD-10-CM | POA: Insufficient documentation

## 2014-11-25 DIAGNOSIS — Z720 Tobacco use: Secondary | ICD-10-CM | POA: Diagnosis not present

## 2014-11-25 DIAGNOSIS — G8929 Other chronic pain: Secondary | ICD-10-CM | POA: Insufficient documentation

## 2014-11-25 DIAGNOSIS — Z792 Long term (current) use of antibiotics: Secondary | ICD-10-CM | POA: Diagnosis not present

## 2014-11-25 DIAGNOSIS — Z880 Allergy status to penicillin: Secondary | ICD-10-CM | POA: Diagnosis not present

## 2014-11-25 DIAGNOSIS — Z87728 Personal history of other specified (corrected) congenital malformations of nervous system and sense organs: Secondary | ICD-10-CM | POA: Insufficient documentation

## 2014-11-25 DIAGNOSIS — K589 Irritable bowel syndrome without diarrhea: Secondary | ICD-10-CM | POA: Diagnosis not present

## 2014-11-25 DIAGNOSIS — S4992XA Unspecified injury of left shoulder and upper arm, initial encounter: Secondary | ICD-10-CM | POA: Diagnosis not present

## 2014-11-25 DIAGNOSIS — I10 Essential (primary) hypertension: Secondary | ICD-10-CM | POA: Insufficient documentation

## 2014-11-25 DIAGNOSIS — S29001A Unspecified injury of muscle and tendon of front wall of thorax, initial encounter: Secondary | ICD-10-CM | POA: Diagnosis not present

## 2014-11-25 DIAGNOSIS — Z791 Long term (current) use of non-steroidal anti-inflammatories (NSAID): Secondary | ICD-10-CM | POA: Insufficient documentation

## 2014-11-25 DIAGNOSIS — J45909 Unspecified asthma, uncomplicated: Secondary | ICD-10-CM | POA: Diagnosis not present

## 2014-11-25 DIAGNOSIS — Y9389 Activity, other specified: Secondary | ICD-10-CM | POA: Insufficient documentation

## 2014-11-25 DIAGNOSIS — M542 Cervicalgia: Secondary | ICD-10-CM

## 2014-11-25 DIAGNOSIS — S199XXA Unspecified injury of neck, initial encounter: Secondary | ICD-10-CM | POA: Diagnosis present

## 2014-11-25 DIAGNOSIS — W19XXXA Unspecified fall, initial encounter: Secondary | ICD-10-CM

## 2014-11-25 DIAGNOSIS — R0789 Other chest pain: Secondary | ICD-10-CM

## 2014-11-25 MED ORDER — OXYCODONE-ACETAMINOPHEN 5-325 MG PO TABS
1.0000 | ORAL_TABLET | Freq: Once | ORAL | Status: DC
  Filled 2014-11-25: qty 1

## 2014-11-25 MED ORDER — HYDROCODONE-ACETAMINOPHEN 5-325 MG PO TABS: 1.0000 | ORAL_TABLET | Freq: Four times a day (QID) | ORAL | Status: DC | PRN

## 2014-11-25 MED ORDER — CYCLOBENZAPRINE HCL 10 MG PO TABS
5.0000 mg | ORAL_TABLET | Freq: Once | ORAL | Status: AC
  Administered 2014-11-25: 5 mg via ORAL
  Filled 2014-11-25: qty 1

## 2014-11-25 MED ORDER — OXYCODONE-ACETAMINOPHEN 5-325 MG PO TABS
1.0000 | ORAL_TABLET | Freq: Once | ORAL | Status: AC
  Administered 2014-11-25: 1 via ORAL

## 2014-11-25 NOTE — Discharge Instructions (Signed)

## 2014-11-25 NOTE — ED Notes (Signed)
Patient returned from X-ray 

## 2014-11-25 NOTE — ED Notes (Signed)
Pt sts that she fell down a few stairs trying to step over her cat. sts weight went on her palms. sts she is having head pain, neck pain, shoulder pain and hand pain. Denies hitting head or LOC.

## 2014-11-25 NOTE — ED Notes (Signed)
NP at the bedside

## 2014-11-25 NOTE — ED Provider Notes (Signed)
CSN: [id number]     Arrival date & time 11/25/14  1233 History   First MD Initiated Contact with Patient 11/25/14 1249     Chief Complaint  Patient presents with  . Fall     (Consider location/radiation/quality/duration/timing/severity/associated sxs/prior Treatment) HPI Comments: Pt comes in after a fall down 5 stairs today. Pt is c/o neck pain, bilateral shoulder and hand pain. Denies loc with fall. Was able to get up immediately after fall. States has some chronic c spine pain at baseline but this is worse then normal. Denies numbness or weakness.  The history is provided by the patient. No language interpreter was used.    Past Medical History  Diagnosis Date  . Hypertension   . Ruptured lumbar disc 1990's    L4-L5  . Choledocholithiasis with acute cholecystitis   . Craniosynostosis     congenital  . Hypercholesterolemia 05/28/2012    Has taken Crestor and Lipitor in the past. Switched to lovastatin b/c its $4.    . Shingles   . IBS (irritable bowel syndrome)   . PONV (postoperative nausea and vomiting)   . Family history of anesthesia complication     "PONV; both parents" (08/17/2013)  . Barrett esophagus     "don't know that I've ever been stretched" (08/17/2013)  . Asthma   . History of blood transfusion 1959-1960  . GERD (gastroesophageal reflux disease)   . H/O hiatal hernia   . Migraines     "since age 75; probably twice/month" (08/17/2013)  . Rheumatoid arthritis     "in my knuckles" (08/17/2013)  . Chronic lower back pain   . DDD (degenerative disc disease)   . Anxiety    Past Surgical History  Procedure Laterality Date  . Abdominal hysterectomy  1992    Total for chronic pelvic pain   . Cholecystectomy  2007    Performed in [place]   . 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 Craniectomy for craniosynostosis  19591 1960    "1st time got infected; 2nd time didn't  take; fix the 3rd time" (08/17/2013)  . Appendectomy    . Back surgery  1990's    L4-L5 fusion @ [facility]  . Posterior lumbar fusion  1990's    "took piece off my hip" (08/17/2013)   Family History  Problem Relation Age of Onset  . Hypertension Mother   . Hypertension Father   . Skin cancer Father   . Colon cancer Paternal Grandfather    History  Substance Use Topics  . Smoking status: Current Every Day Smoker -- 1.00 packs/day for 32 years    Types: Cigarettes  . Smokeless tobacco: Never Used  . Alcohol Use: 0.0 oz/week    0 Not specified per week     Comment: 08/17/2013 "couple mixed drinks/wk"   OB History    No data available     Review of Systems  All other systems reviewed and are negative.     Allergies  Penicillins; Demerol; Doxycycline hyclate; Gabapentin; Sulfa antibiotics; Benadryl; Cephalexin; and Morphine and related  Home Medications   Prior to Admission medications   Medication Sig Start Date End Date Taking? Authorizing Provider  albuterol (PROVENTIL HFA;VENTOLIN HFA) 108 (90 BASE) MCG/ACT inhaler Inhale 2 puffs into the lungs every 6 (six) hours as needed for wheezing. 10/09/14   Leone Haven, MD  benzonatate (TESSALON) 100 MG capsule Take  1 capsule (100 mg total) by mouth 2 (two) times daily as needed for cough. 10/20/14   Zigmund Gottron, MD  butalbital-acetaminophen-caffeine (FIORICET, ESGIC) [phone] MG per tablet Take 1 tablet by mouth daily as needed. For migraines 05/24/14   Angelica Ran, MD  CALCIUM-MAGNESIUM-ZINC PO Take 1 tablet by mouth daily.     Historical Provider, MD  cetirizine (ZYRTEC) 10 MG tablet Take 1 tablet (10 mg total) by mouth daily. 09/08/14   Rosemarie Ax, MD  clonazePAM (KLONOPIN) 0.5 MG tablet Take 1 tablet (0.5 mg total) by mouth 2 (two) times daily as needed. 10/13/14   Rosemarie Ax, MD  Coenzyme Q10 (CO Q-10 PO) Take 1 capsule by mouth daily.     Historical Provider, MD  diclofenac sodium  (VOLTAREN) 1 % GEL Apply 2 g topically 4 (four) times daily as needed. 11/01/14   Rosemarie Ax, MD  Ergocalciferol (VITAMIN D2) 2000 UNITS TABS Take 2,000 Units by mouth daily.    Historical Provider, MD  estradiol (ESTRACE) 0.1 MG/GM vaginal cream Place 1 Applicatorful vaginally 3 (three) times a week. 09/21/14   Dickie La, MD  fluconazole (DIFLUCAN) 150 MG tablet Take 1 tablet (150 mg total) by mouth once. 09/08/14   Rosemarie Ax, MD  fluticasone (FLONASE) 50 MCG/ACT nasal spray Place 2 sprays into both nostrils daily. 10/13/14   Rosemarie Ax, MD  HYDROcodone-acetaminophen [facility]) 10-325 MG per tablet Take 1 tablet by mouth every 6 (six) hours as needed. 10/13/14   Rosemarie Ax, MD  HYDROcodone-homatropine [facility]) 5-1.5 MG/5ML syrup Take 5 mLs by mouth every 8 (eight) hours as needed for cough. 11/02/14   Coral Spikes, DO  hydrOXYzine (VISTARIL) 25 MG capsule Take 1 capsule (25 mg total) by mouth at bedtime as needed for itching. 10/25/14   Rosemarie Ax, MD  levofloxacin (LEVAQUIN) 500 MG tablet Take 1 tablet (500 mg total) by mouth daily. 11/02/14   Jayce G Cook, DO  montelukast (SINGULAIR) 10 MG tablet Take 1 tablet (10 mg total) by mouth at bedtime. 10/13/14   Rosemarie Ax, MD  Multiple Vitamin (MULITIVITAMIN WITH MINERALS) TABS Take 1 tablet by mouth daily.    Historical Provider, MD  nortriptyline (PAMELOR) 50 MG capsule Take 1 capsule (50 mg total) by mouth at bedtime. 10/20/14   Zigmund Gottron, MD  omeprazole-sodium bicarbonate (ZEGERID) 40-1100 MG per capsule Take one tab before breakfast and at bedtime 03/23/14   Inda Castle, MD  Probiotic Product (PROBIOTIC DAILY PO) Take 2 tablets by mouth daily.    Historical Provider, MD  promethazine (PHENERGAN) 12.5 MG suppository Place 1 suppository (12.5 mg total) rectally every 6 (six) hours as needed for nausea or vomiting. 11/02/14   Coral Spikes, DO  promethazine (PHENERGAN) 12.5 MG tablet Take 1 tablet (12.5 mg  total) by mouth every 6 (six) hours as needed for nausea or vomiting. 08/18/14   Bernadene Bell, MD  promethazine (PHENERGAN) 25 MG suppository Place 1 suppository (25 mg total) rectally every 6 (six) hours as needed for nausea or vomiting. 11/03/14   Coral Spikes, DO  propranolol (INDERAL) 20 MG tablet Take 1 tablet (20 mg total) by mouth 2 (two) times daily.    Angelica Ran, MD  ranitidine (ZANTAC) 150 MG tablet Take 1 tablet (150 mg total) by mouth 2 (two) times daily. 05/12/14   Inda Castle, MD  simvastatin (ZOCOR) 40 MG tablet Take 1 tablet (40 mg  total) by mouth at bedtime. 03/02/14   Angelica Ran, MD  terconazole (TERAZOL 7) 0.4 % vaginal cream Place 1 applicator vaginally at bedtime. 09/21/14   Dickie La, MD  triamterene-hydrochlorothiazide (MAXZIDE-25) 37.5-25 MG per tablet Take 1 tablet by mouth daily. 08/10/13   Angelica Ran, MD  TURMERIC PO Take 1 tablet by mouth daily.     Historical Provider, MD  zolpidem (AMBIEN) 10 MG tablet Take 1 tablet (10 mg total) by mouth at bedtime as needed. 02/02/14   Angelica Ran, MD   BP 171/69 mmHg  Pulse 74  Temp(Src) 97.8 F (36.6 C) (Oral)  Resp 18  SpO2 98% Physical Exam  Constitutional: She is oriented to person, place, and time. She appears well-developed and well-nourished.  HENT:  Head: Normocephalic and atraumatic.  Eyes: Conjunctivae and EOM are normal. Pupils are equal, round, and reactive to light.  Neck: Neck supple.  Cardiovascular: Normal rate and regular rhythm.   Pulmonary/Chest: Effort normal and breath sounds normal.  Abdominal: Soft. Bowel sounds are normal. There is no tenderness.  Musculoskeletal:       Cervical back: She exhibits bony tenderness.       Thoracic back: Normal.       Lumbar back: Normal.  Bilateral upper rib pain.full rom and equal grip to upper extremity. Able to do bilateral straight leg raises  Neurological: She is alert and oriented to person, place, and time. She exhibits  normal muscle tone.  Skin: Skin is warm and dry.  Psychiatric: She has a normal mood and affect.  Nursing note and vitals reviewed.   ED Course  Procedures (including critical care time) Labs Review Labs Reviewed - No data to display  Imaging Review Dg Chest 2 View  11/25/2014   CLINICAL DATA:  Fall today  EXAM: CHEST  2 VIEW  COMPARISON:  11/02/2014  FINDINGS: Normal heart size. Clear lungs. No pneumothorax. No pleural effusion. Hyperaeration.  IMPRESSION: No active cardiopulmonary disease.   Electronically Signed   By: Maryclare Bean M.D.   On: 11/25/2014 14:45   Dg Cervical Spine Complete  11/25/2014   CLINICAL DATA:  Fall down steps with neck pain, initial encounter  EXAM: CERVICAL SPINE  4+ VIEWS  COMPARISON:  None.  FINDINGS: Seven cervical segments are well visualized. Vertebral body height is well maintained. The neural foramina are widely patent bilaterally. Only minimal facet hypertrophic changes are seen. Right-sided carotid calcifications are noted. No other soft tissue abnormality is seen.  IMPRESSION: No acute abnormality noted.   Electronically Signed   By: Inez Catalina M.D.   On: 11/25/2014 14:43     EKG Interpretation None      MDM   Final diagnoses:  Fall  Neck pain  Chest wall pain    No acute bony abnormality noted. Pt is neurologically intact. Will treat symptomatically with hydrocodone; pt given ortho referal    Glendell Docker, NP 11/25/14 [place], MD 11/25/14 [phone]

## 2014-12-04 ENCOUNTER — Telehealth: Payer: Self-pay | Admitting: Family Medicine

## 2014-12-04 NOTE — Telephone Encounter (Signed)
Pt called and wanted the doctor to know that she fell over the holidays and was seen in the ER. She wanted the doctor to look at the results before her visit on 12/07/14. jw

## 2014-12-05 ENCOUNTER — Telehealth: Payer: Self-pay | Admitting: Pharmacist

## 2014-12-05 NOTE — Telephone Encounter (Signed)
I called Diana [name] to follow up on her smoking cessation progress.  She reports that she has recently had pneumonia and sprained her neck.   She reports that she is doing well and is down to 2 cigarettes per day using nortriptyline and a nicotine replacement patch.  She denies using the nicotine patch and smoking at the same time. She feels that she will be able to abstain from cigarette use in the near future.   She denies that she needs any further assistance from the pharmacy clinic and will call us with any questions or concerns.

## 2014-12-07 ENCOUNTER — Encounter: Payer: Self-pay | Admitting: Family Medicine

## 2014-12-07 ENCOUNTER — Ambulatory Visit (INDEPENDENT_AMBULATORY_CARE_PROVIDER_SITE_OTHER): Payer: Medicaid Other | Admitting: Family Medicine

## 2014-12-07 VITALS — BP 136/80 | HR 84 | Temp 98.1°F | Ht 61.0 in | Wt 131.1 lb

## 2014-12-07 DIAGNOSIS — I10 Essential (primary) hypertension: Secondary | ICD-10-CM

## 2014-12-07 DIAGNOSIS — Z7189 Other specified counseling: Secondary | ICD-10-CM | POA: Diagnosis not present

## 2014-12-07 DIAGNOSIS — Z720 Tobacco use: Secondary | ICD-10-CM

## 2014-12-07 DIAGNOSIS — G2581 Restless legs syndrome: Secondary | ICD-10-CM | POA: Diagnosis present

## 2014-12-07 DIAGNOSIS — M549 Dorsalgia, unspecified: Secondary | ICD-10-CM | POA: Diagnosis not present

## 2014-12-07 DIAGNOSIS — W19XXXD Unspecified fall, subsequent encounter: Secondary | ICD-10-CM

## 2014-12-07 DIAGNOSIS — F17209 Nicotine dependence, unspecified, with unspecified nicotine-induced disorders: Secondary | ICD-10-CM

## 2014-12-07 DIAGNOSIS — G8929 Other chronic pain: Secondary | ICD-10-CM

## 2014-12-07 LAB — FERRITIN: Ferritin: 90 ng/mL (ref 10–291)

## 2014-12-07 MED ORDER — TRIAMTERENE-HCTZ 37.5-25 MG PO TABS: 1.0000 | ORAL_TABLET | Freq: Every day | ORAL | Status: DC

## 2014-12-07 MED ORDER — NICOTINE 7 MG/24HR TD PT24: 7.0000 mg | MEDICATED_PATCH | Freq: Every day | TRANSDERMAL | Status: DC

## 2014-12-07 MED ORDER — NICOTINE POLACRILEX 4 MG MT LOZG: 4.0000 mg | LOZENGE | OROMUCOSAL | Status: DC | PRN

## 2014-12-07 MED ORDER — PROPRANOLOL HCL 20 MG PO TABS: 20.0000 mg | ORAL_TABLET | Freq: Every evening | ORAL | Status: DC

## 2014-12-07 MED ORDER — CYCLOBENZAPRINE HCL 5 MG PO TABS: 5.0000 mg | ORAL_TABLET | Freq: Three times a day (TID) | ORAL | Status: DC | PRN

## 2014-12-07 MED ORDER — CLONAZEPAM 0.5 MG PO TABS: 0.5000 mg | ORAL_TABLET | Freq: Two times a day (BID) | ORAL | Status: DC | PRN

## 2014-12-07 MED ORDER — HYDROCODONE-ACETAMINOPHEN 10-325 MG PO TABS: 1.0000 | ORAL_TABLET | Freq: Four times a day (QID) | ORAL | Status: DC | PRN

## 2014-12-07 NOTE — Patient Instructions (Signed)
Thank you for coming in,   We decreased your patches to 7 mg and started a lozenge. Please let me know if you can get the lozenges.   I added flexeril to help with your muscle tightness.   I will call you about your iron.   Please follow up with me in a month.    Please feel free to call with any questions or concerns at any time, at [phone]. --Dr. Raeford Razor

## 2014-12-08 ENCOUNTER — Telehealth: Payer: Self-pay | Admitting: *Deleted

## 2014-12-08 NOTE — Progress Notes (Signed)
Subjective:    Patient ID: Diana [name], female    DOB: 07-Apr-1956, 59 y.o.   MRN: [id number]  HPI  GILDA ABBOUD is here for pain f/u, ED f/u, tobacco abuse, GERD.  Indication for chronic opioid: low back pain  Medication and dose: norco 10 mg Q6 PRN  # pills per month: 80 Last UDS date: October 2015 Pain contract signed (Y/N): Yes  Date narcotic database last reviewed (include red flags): no  ED F/U: Recently fell down 5 stairs and was evaluated in the ED. Imaging was neg but she reports worsening of back pain in the thoracic and cervical region. She was given flexeril in the ED and this improved her symptoms. She is scheduled to see her neurosurgeon next Friday in order to received an epidural. Her pain is improved today.   GERD: hx of Barrett's esophagus after EGD. Has been PPI for about 1.5 years now. She takes it on a daily basis. Her indigestion is worse 3 days out of the week. Previously taken nexium and it was more effective. Has taken Ranitidine before and it didn't improve her symptoms.   Tobacco abuse: was seen by Dr. Valentina Lucks and started on 14 mg patches. She has been loyal to placing them and hasn't smoke in 2 days. She stopped taking the nortrytaline because it seemed to make her Restless leg syndrome worse. She takes the patch off at night because it was giving her nightmares. She does feel more jittery since being on the patch.    Current Outpatient Prescriptions on File Prior to Visit  Medication Sig Dispense Refill  . albuterol (PROVENTIL HFA;VENTOLIN HFA) 108 (90 BASE) MCG/ACT inhaler Inhale 2 puffs into the lungs every 6 (six) hours as needed for wheezing. 8.5 g 5  . butalbital-acetaminophen-caffeine (FIORICET, ESGIC) 50-325-40 MG per tablet Take 1 tablet by mouth daily as needed. For migraines 30 tablet 1  . CALCIUM-MAGNESIUM-ZINC PO Take 1 tablet by mouth daily.     . cetirizine (ZYRTEC) 10 MG tablet Take 1 tablet (10 mg total) by mouth daily. 30 tablet 1  . Coenzyme  Q10 (CO Q-10 PO) Take 1 capsule by mouth daily.     . diclofenac sodium (VOLTAREN) 1 % GEL Apply 2 g topically 4 (four) times daily as needed. 100 g 5  . Ergocalciferol (VITAMIN D2) 2000 UNITS TABS Take 2,000 Units by mouth daily.    . fluticasone (FLONASE) 50 MCG/ACT nasal spray Place 2 sprays into both nostrils daily. 16 g 6  . hydrOXYzine (VISTARIL) 25 MG capsule Take 1 capsule (25 mg total) by mouth at bedtime as needed for itching. 60 capsule 1  . montelukast (SINGULAIR) 10 MG tablet Take 1 tablet (10 mg total) by mouth at bedtime. 30 tablet 0  . Multiple Vitamin (MULITIVITAMIN WITH MINERALS) TABS Take 1 tablet by mouth daily.    Marland Kitchen omeprazole-sodium bicarbonate (ZEGERID) 40-1100 MG per capsule Take one tab before breakfast and at bedtime 45 capsule 3  . Probiotic Product (PROBIOTIC DAILY PO) Take 2 tablets by mouth daily.    . promethazine (PHENERGAN) 25 MG suppository Place 1 suppository (25 mg total) rectally every 6 (six) hours as needed for nausea or vomiting. 30 each 0  . simvastatin (ZOCOR) 40 MG tablet Take 1 tablet (40 mg total) by mouth at bedtime. 30 tablet 11  . TURMERIC PO Take 1 tablet by mouth daily.     Marland Kitchen zolpidem (AMBIEN) 10 MG tablet Take 1 tablet (10 mg total) by  mouth at bedtime as needed. 15 tablet 5   No current facility-administered medications on file prior to visit.    Review of Systems See HPI     Objective:   Physical Exam BP 136/80 mmHg  Pulse 84  Temp(Src) 98.1 F (36.7 C) (Oral)  Ht 5\' 1"  (1.549 m)  Wt 131 lb 1.6 oz (59.467 kg)  BMI 24.78 kg/m2 Gen: NAD, alert, cooperative with exam, antalgic gait  CV: RRR, good S1/S2, no murmur, Resp: exp wheeze, non-labored Back:  Appearance: sciolosis no Palpation: tenderness of paraspinal muscles yes, spinous process yes; pelvis no  Flexion: limited due to pain  Extension: limited due to pain   Hip:  Appearance: no erythema or ecchymosis  Palpation: tenderness of greater trochanter no Rotation Reduced:  internal yes more on the right compared to left, external yes more on right compared to left  FADIR: normal  FABER: + b/l    Neuro: Strength hip flexion 5/5, hip abduction 5/5, hip adduction 5/5,  knee extension 5/5, knee flexion 5/5, dorsiflexion 5/5, plantar flexion 5/5 Reflexes: patella 2/2 Bilateral  Straight Leg Raise: negative Sensation to light touch intact: yes Neurovascularly intact    Assessment & Plan:

## 2014-12-08 NOTE — Assessment & Plan Note (Signed)
Smoke free for 2 days. Stopped the nortriptyline due to it worsening her RLS. Feels jittery at night.  - decrease patch to 7 mg  - added lozenges 4 mg

## 2014-12-08 NOTE — Telephone Encounter (Signed)
-----   Message from Rosemarie Ax, MD sent at 12/08/2014  8:23 AM EST ----- Please inform that her lab value was normal. Thanks.

## 2014-12-08 NOTE — Assessment & Plan Note (Signed)
Pain is exacerbated by the recent fall. Her pain is improved today.  - refilled Norco  - flexeril added to regimen for short term  - She will see her neurosurgeon for epidural injections.  - - cannot afford PT because her insurance doesn't cover it.

## 2014-12-08 NOTE — Assessment & Plan Note (Signed)
Has been worsened by the nortriptyline so this was stopped.  - refilled klonopin  - ferritin: normal

## 2014-12-08 NOTE — Telephone Encounter (Signed)
Pt informed of below message in reference to labs. Katharina Caper, Trenell Concannon D

## 2015-01-08 ENCOUNTER — Other Ambulatory Visit: Payer: Self-pay | Admitting: *Deleted

## 2015-01-08 DIAGNOSIS — G4700 Insomnia, unspecified: Secondary | ICD-10-CM

## 2015-01-12 MED ORDER — ZOLPIDEM TARTRATE 10 MG PO TABS: 10.0000 mg | ORAL_TABLET | Freq: Every evening | ORAL | Status: DC | PRN

## 2015-01-12 NOTE — Telephone Encounter (Signed)
Spoke with patient and informed her of rx up front for pick up

## 2015-04-20 ENCOUNTER — Encounter: Payer: Self-pay | Admitting: Family Medicine

## 2015-04-20 ENCOUNTER — Ambulatory Visit (INDEPENDENT_AMBULATORY_CARE_PROVIDER_SITE_OTHER): Payer: Medicaid Other | Admitting: Family Medicine

## 2015-04-20 VITALS — BP 112/76 | HR 80 | Temp 98.0°F | Ht 61.0 in | Wt 137.4 lb

## 2015-04-20 DIAGNOSIS — Z7189 Other specified counseling: Secondary | ICD-10-CM | POA: Diagnosis not present

## 2015-04-20 DIAGNOSIS — E7800 Pure hypercholesterolemia, unspecified: Secondary | ICD-10-CM

## 2015-04-20 DIAGNOSIS — M549 Dorsalgia, unspecified: Secondary | ICD-10-CM

## 2015-04-20 DIAGNOSIS — G2581 Restless legs syndrome: Secondary | ICD-10-CM

## 2015-04-20 DIAGNOSIS — Z114 Encounter for screening for human immunodeficiency virus [HIV]: Secondary | ICD-10-CM | POA: Diagnosis not present

## 2015-04-20 DIAGNOSIS — I10 Essential (primary) hypertension: Secondary | ICD-10-CM

## 2015-04-20 DIAGNOSIS — E785 Hyperlipidemia, unspecified: Secondary | ICD-10-CM | POA: Diagnosis not present

## 2015-04-20 DIAGNOSIS — G43109 Migraine with aura, not intractable, without status migrainosus: Secondary | ICD-10-CM | POA: Diagnosis not present

## 2015-04-20 DIAGNOSIS — G8929 Other chronic pain: Secondary | ICD-10-CM | POA: Diagnosis not present

## 2015-04-20 LAB — LIPID PANEL
CHOL/HDL RATIO: 5.7 ratio
Cholesterol: 301 mg/dL — ABNORMAL HIGH (ref 0–200)
HDL: 53 mg/dL (ref >=46)
LDL Cholesterol: 214 mg/dL — ABNORMAL HIGH (ref 0–99)
TRIGLYCERIDES: 171 mg/dL — AB (ref <150)
VLDL: 34 mg/dL (ref 0–40)

## 2015-04-20 MED ORDER — CLONAZEPAM 0.5 MG PO TABS: 0.5000 mg | ORAL_TABLET | Freq: Two times a day (BID) | ORAL | Status: DC | PRN

## 2015-04-20 MED ORDER — TRIAMTERENE-HCTZ 37.5-25 MG PO TABS: 1.0000 | ORAL_TABLET | Freq: Every day | ORAL | Status: DC

## 2015-04-20 MED ORDER — HYDROCODONE-ACETAMINOPHEN 10-325 MG PO TABS: 1.0000 | ORAL_TABLET | Freq: Four times a day (QID) | ORAL | Status: DC | PRN

## 2015-04-20 MED ORDER — BUTALBITAL-APAP-CAFFEINE 50-325-40 MG PO TABS: 1.0000 | ORAL_TABLET | Freq: Every day | ORAL | Status: DC | PRN

## 2015-04-20 MED ORDER — PROPRANOLOL HCL 10 MG PO TABS
10.0000 mg | ORAL_TABLET | Freq: Every day | ORAL | Status: DC
Start: 2015-04-20 — End: 2017-02-24

## 2015-04-20 NOTE — Assessment & Plan Note (Signed)
Pain is under control with medication  - flexeril helping  - norco refilled.  - f/u in three months.

## 2015-04-20 NOTE — Progress Notes (Signed)
I was available as preceptor to resident for this patient's office visit.  

## 2015-04-20 NOTE — Patient Instructions (Signed)
Thank you for coming in,   Please send me the notes from your neurologist and the results of your MRI.   Please follow up with me in 3 months.   Try to check your blood pressure at least once a month. Call and let me know how it is doing since we decreased your medications.   Please bring all of your medications with you to each visit.    Please feel free to call with any questions or concerns at any time, at [phone]. --Dr. Raeford Razor

## 2015-04-20 NOTE — Assessment & Plan Note (Signed)
Currently on zocor. Will check lipids today.  - encouraged to take ASA 81 mg daily

## 2015-04-20 NOTE — Progress Notes (Signed)
Subjective:    Patient ID: Diana [name], female    DOB: 05-06-1956, 59 y.o.   MRN: [id number]  HPI  Diana [name] is here for f/u of chronic problems.  Indication for chronic opioid: chronic back pain  Medication and dose: Norco  # pills per month: 80  Last UDS date: October 1015 Pain contract signed (Y/N): y Date narcotic database last reviewed (include red flags): Concerning that she is on Klonopin for restless leg and also taking Fioricet for migraines. She reports being on these long term and they were started by a provider long ago.    HLD: she is on a statin and hasn't had lab drawn in some time. She is compliant with medications with no problems.   HTN Disease Monitoring: Home BP Monitoring has home BP cuff Chest pain- no    Dyspnea- no Medications:propranolol, HCTZ, triamterene  Compliance-  yes. Lightheadedness-  yes  Edema- no  Migraines/Headaches: She is followed by Dr. Metta Clines in [place]. He recently ordered an MRI on her head and shoulder. Her migraines seem to be getting worse and she takes Fiorocet for them. She takes this once daily PRN. The pain occur behind her left eye and in the back of her head.  They are throbbing in nature with photophobia and phonophobia. She has auras but no injection or tearing of her eyes.  The migraines lasted for the past three weeks and intermittent in nature. They seem to be getting worse.        Current Outpatient Prescriptions on File Prior to Visit  Medication Sig Dispense Refill  . albuterol (PROVENTIL HFA;VENTOLIN HFA) 108 (90 BASE) MCG/ACT inhaler Inhale 2 puffs into the lungs every 6 (six) hours as needed for wheezing. 8.5 g 5  . butalbital-acetaminophen-caffeine (FIORICET, ESGIC) 50-325-40 MG per tablet Take 1 tablet by mouth daily as needed. For migraines 30 tablet 1  . CALCIUM-MAGNESIUM-ZINC PO Take 1 tablet by mouth daily.     . cetirizine (ZYRTEC) 10 MG tablet Take 1 tablet (10 mg total) by mouth daily. 30 tablet 1  .  clonazePAM (KLONOPIN) 0.5 MG tablet Take 1 tablet (0.5 mg total) by mouth 2 (two) times daily as needed. 60 tablet 0  . Coenzyme Q10 (CO Q-10 PO) Take 1 capsule by mouth daily.     Marland Kitchen conjugated estrogens (PREMARIN) vaginal cream Place 1 Applicatorful vaginally at bedtime.    . cyclobenzaprine (FLEXERIL) 5 MG tablet Take 1 tablet (5 mg total) by mouth 3 (three) times daily as needed for muscle spasms. 30 tablet 0  . diclofenac sodium (VOLTAREN) 1 % GEL Apply 2 g topically 4 (four) times daily as needed. 100 g 5  . Ergocalciferol (VITAMIN D2) 2000 UNITS TABS Take 2,000 Units by mouth daily.    . fluticasone (FLONASE) 50 MCG/ACT nasal spray Place 2 sprays into both nostrils daily. 16 g 6  . HYDROcodone-acetaminophen (NORCO) 10-325 MG per tablet Take 1 tablet by mouth every 6 (six) hours as needed. 80 tablet 0  . hydrOXYzine (VISTARIL) 25 MG capsule Take 1 capsule (25 mg total) by mouth at bedtime as needed for itching. 60 capsule 1  . montelukast (SINGULAIR) 10 MG tablet Take 1 tablet (10 mg total) by mouth at bedtime. 30 tablet 0  . Multiple Vitamin (MULITIVITAMIN WITH MINERALS) TABS Take 1 tablet by mouth daily.    . nicotine (NICODERM CQ - DOSED IN MG/24 HR) 7 mg/24hr patch Place 1 patch (7 mg total) onto the skin daily.  28 patch 0  . nicotine polacrilex (COMMIT) 4 MG lozenge Take 1 lozenge (4 mg total) by mouth as needed for smoking cessation. 100 tablet 0  . omeprazole-sodium bicarbonate (ZEGERID) 40-1100 MG per capsule Take one tab before breakfast and at bedtime 45 capsule 3  . Probiotic Product (PROBIOTIC DAILY PO) Take 2 tablets by mouth daily.    . promethazine (PHENERGAN) 25 MG suppository Place 1 suppository (25 mg total) rectally every 6 (six) hours as needed for nausea or vomiting. 30 each 0  . propranolol (INDERAL) 20 MG tablet Take 1 tablet (20 mg total) by mouth every evening. 90 tablet 1  . simvastatin (ZOCOR) 40 MG tablet Take 1 tablet (40 mg total) by mouth at bedtime. 30 tablet 11    . triamterene-hydrochlorothiazide (MAXZIDE-25) 37.5-25 MG per tablet Take 1 tablet by mouth daily. 90 tablet 3  . TURMERIC PO Take 1 tablet by mouth daily.     Marland Kitchen zolpidem (AMBIEN) 10 MG tablet Take 1 tablet (10 mg total) by mouth at bedtime as needed. 15 tablet 5   No current facility-administered medications on file prior to visit.   SHx: on disability   Health Maintenance:  Has mammogram scheduled for next week   Review of Systems See HPI     Objective:   Physical Exam BP 112/76 mmHg  Pulse 80  Temp(Src) 98 F (36.7 C) (Oral)  Ht 5\' 1"  (1.549 m)  Wt 137 lb 6 oz (62.313 kg)  BMI 25.97 kg/m2 Gen: NAD, alert, cooperative with exam HEENT: NCAT, clear conjunctiva,  CV: RRR, good S1/S2, no murmur, no edema, capillary refill brisk  Resp: CTABL, no wheezes, non-labored Skin: no rashes, normal turgor  Neuro: no gross deficits. Normal sensation in b/l LE, 5/5 strength in LE b/l, +2 DTR's patellar b/l  Back: tenderness of central lumbar area       Assessment & Plan:

## 2015-04-20 NOTE — Assessment & Plan Note (Signed)
Asymptomatic today. She is following Dr. Metta Clines (Neurologist) for this problem. Practices in [place].  He recently ordered an MRI Head and shoulder. No red flags today.  Unclear if this is a migraine or related to tension HA (back in the back of the next) and medication overuse (several pain medications for several years).  - she will need to forward his notes to me and the MRI results - given enough Fiorocet #10 in order to get her to the neurologist appt. Schedule for next week.   - given warning of her taking this medication with the other medications that she is currently taking (Norco, klonopin)  - if her neurologist thinks that is an ok medication to keep prescribing then I will prescribe it but need to continue this discussion.  - may benefit in a referral to pharmacy clinic for polypharmacy

## 2015-04-20 NOTE — Assessment & Plan Note (Signed)
Controlled. Reporting some lightheadedness with standing.  - decrease propranolol to 10 mg daily  - continue HCTZ/Triamterene 25/37.5 mg

## 2015-04-21 LAB — HIV ANTIBODY (ROUTINE TESTING W REFLEX): HIV 1&2 Ab, 4th Generation: NONREACTIVE

## 2015-05-03 ENCOUNTER — Encounter: Payer: Self-pay | Admitting: Family Medicine

## 2015-10-19 ENCOUNTER — Ambulatory Visit (INDEPENDENT_AMBULATORY_CARE_PROVIDER_SITE_OTHER): Payer: Medicaid Other | Admitting: Family Medicine

## 2015-10-19 VITALS — BP 154/79 | HR 95 | Temp 98.5°F | Wt 130.0 lb

## 2015-10-19 DIAGNOSIS — J441 Chronic obstructive pulmonary disease with (acute) exacerbation: Secondary | ICD-10-CM | POA: Diagnosis present

## 2015-10-19 DIAGNOSIS — J984 Other disorders of lung: Secondary | ICD-10-CM

## 2015-10-19 MED ORDER — PREDNISONE 50 MG PO TABS
50.0000 mg | ORAL_TABLET | Freq: Every day | ORAL | Status: DC
Start: 2015-10-19 — End: 2015-10-31

## 2015-10-19 MED ORDER — HYDROCODONE-HOMATROPINE 5-1.5 MG PO TABS: 1.0000 | ORAL_TABLET | Freq: Four times a day (QID) | ORAL | Status: DC | PRN

## 2015-10-19 MED ORDER — LEVOFLOXACIN 500 MG PO TABS: 500.0000 mg | ORAL_TABLET | Freq: Every day | ORAL | Status: DC

## 2015-10-19 MED ORDER — FLUCONAZOLE 150 MG PO TABS: 150.0000 mg | ORAL_TABLET | Freq: Once | ORAL | Status: DC

## 2015-10-19 NOTE — Patient Instructions (Addendum)
Thank you so much for coming to visit today! Please complete a seven day course of Levaquin and a five day course of steroids. I am also giving you a medication for cough. If you develop a yeast infection from the [facility], you may fill your prescription for Diflucan. If your shortness of breath worsens, please schedule another appointment or call [phone] depending on the severity of your symptoms.  Thanks again! Dr. Gerlean Ren

## 2015-10-23 DIAGNOSIS — J441 Chronic obstructive pulmonary disease with (acute) exacerbation: Secondary | ICD-10-CM | POA: Insufficient documentation

## 2015-10-23 DIAGNOSIS — J984 Other disorders of lung: Secondary | ICD-10-CM | POA: Insufficient documentation

## 2015-10-23 NOTE — Progress Notes (Signed)
Subjective:     Patient ID: Diana [name], female   DOB: 1956-01-21, 58 y.o.   MRN: [id number]  HPI Diana [name] is a 59yo female presenting today with cough. - Notes non-productive cough x2 weeks, wheezing - Sinus headache, rhinorrhea preceded cough. Resolved. - Albuterol treatments not effective - Denies fever - Has been taking Tylenol with some relief - Denies chest pain, but admits to some rib pain with breathing and coughing - Notes shortness of breath with cough and exertion - Notes history of COPD. Chart review shows moderate restrictive lung disease from PFT in 2015. - Smokes 1/2 pack per day  Review of Systems Per HPI    Objective:   Physical Exam  Constitutional: She appears well-developed and well-nourished. No distress.  Cardiovascular: Normal rate and regular rhythm.  Exam reveals no gallop and no friction rub.   No murmur heard. Pulmonary/Chest: Effort normal. No respiratory distress.  Mild wheezing  Musculoskeletal: She exhibits no edema.       Assessment and Plan:     Restrictive lung disease - Duoneb given in office with some improvement - Course of Levaquin given. Short course of Prednisone x5 days. - Chart review shows Tessalon not effective. Hycodan #15 given. Instructed not to take unless needed. Cautioned on risks of medication if not taken as prescribed. - Prescription for Diflucan given to take if yeast infection develops during antibiotic course - Counseling on smoking cessation given - Follow up if not improvement

## 2015-10-23 NOTE — Assessment & Plan Note (Addendum)
-   Duoneb given in office with some improvement - Course of Levaquin given. Short course of Prednisone x5 days. - Chart review shows Tessalon not effective. Hycodan #15 given. Instructed not to take unless needed. Cautioned on risks of medication if not taken as prescribed. - Prescription for Diflucan given to take if yeast infection develops during antibiotic course - Counseling on smoking cessation given - Follow up if not improvement

## 2015-10-31 ENCOUNTER — Encounter: Payer: Self-pay | Admitting: Family Medicine

## 2015-10-31 ENCOUNTER — Ambulatory Visit (INDEPENDENT_AMBULATORY_CARE_PROVIDER_SITE_OTHER): Payer: Medicaid Other | Admitting: Family Medicine

## 2015-10-31 VITALS — BP 140/68 | HR 87 | Temp 98.3°F | Ht 61.0 in | Wt 130.8 lb

## 2015-10-31 DIAGNOSIS — I10 Essential (primary) hypertension: Secondary | ICD-10-CM | POA: Diagnosis not present

## 2015-10-31 DIAGNOSIS — E7800 Pure hypercholesterolemia, unspecified: Secondary | ICD-10-CM

## 2015-10-31 DIAGNOSIS — Z7189 Other specified counseling: Secondary | ICD-10-CM | POA: Diagnosis not present

## 2015-10-31 DIAGNOSIS — G2581 Restless legs syndrome: Secondary | ICD-10-CM | POA: Diagnosis not present

## 2015-10-31 DIAGNOSIS — F17209 Nicotine dependence, unspecified, with unspecified nicotine-induced disorders: Secondary | ICD-10-CM

## 2015-10-31 DIAGNOSIS — Z720 Tobacco use: Secondary | ICD-10-CM

## 2015-10-31 DIAGNOSIS — G4700 Insomnia, unspecified: Secondary | ICD-10-CM | POA: Diagnosis not present

## 2015-10-31 DIAGNOSIS — E785 Hyperlipidemia, unspecified: Secondary | ICD-10-CM | POA: Diagnosis not present

## 2015-10-31 DIAGNOSIS — G8929 Other chronic pain: Secondary | ICD-10-CM | POA: Diagnosis not present

## 2015-10-31 DIAGNOSIS — M549 Dorsalgia, unspecified: Secondary | ICD-10-CM | POA: Diagnosis not present

## 2015-10-31 DIAGNOSIS — R112 Nausea with vomiting, unspecified: Secondary | ICD-10-CM

## 2015-10-31 DIAGNOSIS — Z23 Encounter for immunization: Secondary | ICD-10-CM | POA: Diagnosis present

## 2015-10-31 LAB — CBC
HCT: 41.5 % (ref 36.0–46.0)
Hemoglobin: 14.6 g/dL (ref 12.0–15.0)
MCH: 30.6 pg (ref 26.0–34.0)
MCHC: 35.2 g/dL (ref 30.0–36.0)
MCV: 87 fL (ref 78.0–100.0)
MPV: 9.4 fL (ref 8.6–12.4)
Platelets: 240 10*3/uL (ref 150–400)
RBC: 4.77 MIL/uL (ref 3.87–5.11)
RDW: 12.5 % (ref 11.5–15.5)
WBC: 11.8 10*3/uL — ABNORMAL HIGH (ref 4.0–10.5)

## 2015-10-31 LAB — TSH: TSH: 2.184 u[IU]/mL (ref 0.350–4.500)

## 2015-10-31 MED ORDER — NICOTINE 7 MG/24HR TD PT24: 7.0000 mg | MEDICATED_PATCH | Freq: Every day | TRANSDERMAL | Status: DC

## 2015-10-31 MED ORDER — ROSUVASTATIN CALCIUM 20 MG PO TABS: 20.0000 mg | ORAL_TABLET | Freq: Every day | ORAL | Status: DC

## 2015-10-31 MED ORDER — SIMVASTATIN 40 MG PO TABS: 40.0000 mg | ORAL_TABLET | Freq: Every day | ORAL | Status: DC

## 2015-10-31 MED ORDER — HYDROXYZINE PAMOATE 25 MG PO CAPS: 25.0000 mg | ORAL_CAPSULE | Freq: Every evening | ORAL | Status: DC | PRN

## 2015-10-31 MED ORDER — TRIAMTERENE-HCTZ 37.5-25 MG PO TABS
1.0000 | ORAL_TABLET | Freq: Every day | ORAL | Status: DC
Start: 2015-10-31 — End: 2017-02-24

## 2015-10-31 MED ORDER — OMEPRAZOLE-SODIUM BICARBONATE 40-1100 MG PO CAPS: ORAL_CAPSULE | ORAL | Status: DC

## 2015-10-31 MED ORDER — ZOLPIDEM TARTRATE 10 MG PO TABS: 10.0000 mg | ORAL_TABLET | Freq: Every evening | ORAL | Status: DC | PRN

## 2015-10-31 MED ORDER — HYDROCODONE-ACETAMINOPHEN 10-325 MG PO TABS: 1.0000 | ORAL_TABLET | Freq: Four times a day (QID) | ORAL | Status: DC | PRN

## 2015-10-31 MED ORDER — VARENICLINE TARTRATE 1 MG PO TABS: 1.0000 mg | ORAL_TABLET | Freq: Two times a day (BID) | ORAL | Status: DC

## 2015-10-31 MED ORDER — DICLOFENAC SODIUM 1 % TD GEL: 2.0000 g | Freq: Four times a day (QID) | TRANSDERMAL | Status: DC | PRN

## 2015-10-31 NOTE — Progress Notes (Signed)
Subjective:    Diana [name] - 59 y.o. female MRN [id number]  Date of birth: 02-09-1956  HPI  Diana [name] is here for HTN and chronic pain .  HTN Disease Monitoring: Home BP Monitoring none Medications: Chest pain- no     Dyspnea- no Compliance-  yes.  Lightheadedness-  no   Edema- no   Indication for chronic opioid: chronic back pain  Medication and dose: Norco  # pills per month: 80 Last UDS date: October 1015 Pain contract signed (Y/N): y Date narcotic database last reviewed (include red flags): Concerning that she is on Klonopin for restless leg and also taking Fioricet for migraines. She reports being on these long term and they were started by a provider long ago.  Tobacco abuse:  Has quit three times  She lasted 2 years when she quit.  She was having martial problems as to why she started smoking again.  No one else at home is smoking  She reports that her stress is low currently  10/10 importance in that she knows she needs to stop She enjoys smoking and smokes first thing in the morning  She notices her craving the most when she is driving.   She has used chantix before and that is when she was able to stop for 2 years.   HYPERLIPIDEMIA Symptoms Chest pain on exertion:  no   . Leg claudication:   Having some cramps at night. Denies claudication type symptoms with walking  Medications: simvastatin  Compliance- yes  Right upper quadrant pain- no   Muscle aches- no     Component Value Date/Time   CHOL 301* 04/20/2015 1138   TRIG 171* 04/20/2015 1138   HDL 53 04/20/2015 1138   LDLDIRECT 246* 10/31/2015 1444   VLDL 34 04/20/2015 1138   CHOLHDL 5.7 04/20/2015 1138     Health Maintenance:  - received mammogram in [place] Maintenance Due  Topic Date Due  . Hepatitis C Screening  05/28/1956  . MAMMOGRAM  04/08/2014    -  reports that she has been smoking Cigarettes.  She has a 32 pack-year smoking history. She has never used smokeless  tobacco. - Review of Systems: Per HPI. - Past Medical History: Patient Active Problem List   Diagnosis Date Noted  . Restrictive lung disease 10/23/2015  . H/O: hysterectomy 09/22/2014  . Postmenopausal atrophic vaginitis 09/22/2014  . History of right hip replacement 07/24/2014  . Postmenopausal bone loss 05/29/2014  . Migraine headache with aura 05/24/2014  . 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 Colon cancer screening 03/27/2013  . Irritable bowel syndrome (IBS) 06/07/2012  . Restless leg syndrome 05/28/2012  . Anxiety and depression 05/28/2012  . Hypertension, essential, benign 05/28/2012  . Tobacco use disorder, continuous 05/28/2012  . Hypercholesterolemia 05/28/2012  . Encounter for chronic pain management 05/20/2012  . Barrett's esophagus 05/20/2012   - Medications: reviewed and updated Current Outpatient Prescriptions  Medication Sig Dispense Refill  . albuterol (PROVENTIL HFA;VENTOLIN HFA) 108 (90 BASE) MCG/ACT inhaler Inhale 2 puffs into the lungs every 6 (six) hours as needed for wheezing. 8.5 g 5  . butalbital-acetaminophen-caffeine (FIORICET, ESGIC) 50-325-40 MG per tablet Take 1 tablet by mouth daily as needed. For migraines 10 tablet 0  . CALCIUM-MAGNESIUM-ZINC PO Take 1 tablet by mouth daily.     . cetirizine (ZYRTEC) 10 MG tablet Take 1 tablet (10 mg total) by mouth daily. 30 tablet 1  . clonazePAM (KLONOPIN) 0.5 MG tablet  Take 1 tablet (0.5 mg total) by mouth 2 (two) times daily as needed. 60 tablet 0  . Coenzyme Q10 (CO Q-10 PO) Take 1 capsule by mouth daily.     . diclofenac sodium (VOLTAREN) 1 % GEL Apply 2 g topically 4 (four) times daily as needed. 100 g 5  . Ergocalciferol (VITAMIN D2) 2000 UNITS TABS Take 2,000 Units by mouth daily.    . fluticasone (FLONASE) 50 MCG/ACT nasal spray Place 2 sprays into both nostrils daily. 16 g 6  . HYDROcodone-acetaminophen (NORCO) 10-325 MG tablet Take 1 tablet by mouth every 6 (six) hours as needed. 80  tablet 0  . hydrOXYzine (VISTARIL) 25 MG capsule Take 1 capsule (25 mg total) by mouth at bedtime as needed for itching. 60 capsule 1  . montelukast (SINGULAIR) 10 MG tablet Take 1 tablet (10 mg total) by mouth at bedtime. 30 tablet 0  . Multiple Vitamin (MULITIVITAMIN WITH MINERALS) TABS Take 1 tablet by mouth daily.    . nicotine (NICODERM CQ - DOSED IN MG/24 HR) 7 mg/24hr patch Place 1 patch (7 mg total) onto the skin daily. 28 patch 0  . omeprazole-sodium bicarbonate (ZEGERID) 40-1100 MG capsule Take one tab before breakfast and at bedtime 45 capsule 3  . Probiotic Product (PROBIOTIC DAILY PO) Take 2 tablets by mouth daily.    . promethazine (PHENERGAN) 25 MG suppository Place 1 suppository (25 mg total) rectally every 6 (six) hours as needed for nausea or vomiting. 30 each 0  . propranolol (INDERAL) 10 MG tablet Take 1 tablet (10 mg total) by mouth daily. 30 tablet 1  . simvastatin (ZOCOR) 40 MG tablet Take 1 tablet (40 mg total) by mouth at bedtime. 30 tablet 11  . triamterene-hydrochlorothiazide (MAXZIDE-25) 37.5-25 MG tablet Take 1 tablet by mouth daily. 90 tablet 1  . TURMERIC PO Take 1 tablet by mouth daily.     Marland Kitchen zolpidem (AMBIEN) 10 MG tablet Take 1 tablet (10 mg total) by mouth at bedtime as needed. 15 tablet 5  . conjugated estrogens (PREMARIN) vaginal cream Place 1 Applicatorful vaginally at bedtime.    . cyclobenzaprine (FLEXERIL) 5 MG tablet Take 1 tablet (5 mg total) by mouth 3 (three) times daily as needed for muscle spasms. (Patient not taking: Reported on 10/31/2015) 30 tablet 0  . rosuvastatin (CRESTOR) 20 MG tablet Take 1 tablet (20 mg total) by mouth daily. 30 tablet 3  . varenicline (CHANTIX) 1 MG tablet Take 1 tablet (1 mg total) by mouth 2 (two) times daily. Place for hold 30 tablet 3   No current facility-administered medications for this visit.     Review of Systems See HPI     Objective:   Physical Exam BP 140/68 mmHg  Pulse 87  Temp(Src) 98.3 F (36.8 C)  (Oral)  Ht 5\' 1"  (1.549 m)  Wt 130 lb 12.8 oz (59.33 kg)  BMI 24.73 kg/m2 Gen: NAD, alert, cooperative with exam,  CV: RRR, good S1/S2, no murmur, no edema, capillary refill brisk  Resp: CTABL, no wheezes, non-labored Skin: no rashes, normal turgor  Neuro: no gross deficits.  Psych: alert and oriented   Assessment & Plan:   Hypertension, essential, benign Controlled.  - Refilled medications - labs pending   Hypercholesterolemia Uncontrolled Having some leg cramps and may be component of PAD Long history of tobacco abuse - Switch from simvastatin to Crestor - We'll determine if she is able to afford Crestor. If she isn't then switch to a lower cost medication -  I had refilled the simvastatin but have called the pharmacy to discontinue insulin and the new prescription for Crestor - referral to Dr. Valentina Lucks for ABI testing  Tobacco use disorder, continuous Is willing to quit now that she has a new grandchild on the way  Has quit previously with chantix  - called in the start pack to the pharmacy  - sent in the prescription once the start pack is done  - I originally sent in patches but called and cancelled these.   Encounter for chronic pain management Stable  Refilled medications.

## 2015-10-31 NOTE — Patient Instructions (Signed)
Thank you for coming in,   Please make a follow up with Dr. Valentina Lucks for arterial brachial index measurement. Please make this appointment when you leave today.   I have refilled your medications today.   I will call or send a letter with the results from today.    Please bring all of your medications with you to each visit.   Sign up for My Chart to have easy access to your labs results, and communication with your Primary care physician   Please feel free to call with any questions or concerns at any time, at [phone]. --Dr. Raeford Razor

## 2015-11-01 ENCOUNTER — Telehealth: Payer: Self-pay | Admitting: Family Medicine

## 2015-11-01 ENCOUNTER — Encounter: Payer: Self-pay | Admitting: Family Medicine

## 2015-11-01 DIAGNOSIS — G2581 Restless legs syndrome: Secondary | ICD-10-CM

## 2015-11-01 DIAGNOSIS — G43109 Migraine with aura, not intractable, without status migrainosus: Secondary | ICD-10-CM

## 2015-11-01 LAB — COMPLETE METABOLIC PANEL WITH GFR
ALBUMIN: 4.1 g/dL (ref 3.6–5.1)
ALK PHOS: 79 U/L (ref 33–130)
ALT: 19 U/L (ref 6–29)
AST: 19 U/L (ref 10–35)
BILIRUBIN TOTAL: 0.9 mg/dL (ref 0.2–1.2)
BUN: 10 mg/dL (ref 7–25)
CALCIUM: 9.3 mg/dL (ref 8.6–10.4)
CO2: 28 mmol/L (ref 20–31)
Chloride: 92 mmol/L — ABNORMAL LOW (ref 98–110)
Creat: 0.64 mg/dL (ref 0.50–1.05)
GFR, Est African American: >89 mL/min (ref >=60)
GFR, Est Non African American: >89 mL/min (ref >=60)
GLUCOSE: 84 mg/dL (ref 65–99)
Potassium: 3.8 mmol/L (ref 3.5–5.3)
Sodium: 131 mmol/L — ABNORMAL LOW (ref 135–146)
TOTAL PROTEIN: 6.9 g/dL (ref 6.1–8.1)

## 2015-11-01 LAB — LDL CHOLESTEROL, DIRECT: LDL DIRECT: 246 mg/dL — AB (ref <130)

## 2015-11-01 NOTE — Assessment & Plan Note (Signed)
Uncontrolled Having some leg cramps and may be component of PAD Long history of tobacco abuse - Switch from simvastatin to Crestor - We'll determine if she is able to afford Crestor. If she isn't then switch to a lower cost medication - I had refilled the simvastatin but have called the pharmacy to discontinue insulin and the new prescription for Crestor - referral to Dr. Valentina Lucks for ABI testing

## 2015-11-01 NOTE — Telephone Encounter (Signed)
Pt called because there is a lot of confusion on her medications at the pharmacy. She was able to pick up some of them. Two of them will require PA, but the rest they do not have. Can we call to the pharmacy and verify what the pt is saying and see which of the medications are missing. jw

## 2015-11-01 NOTE — Assessment & Plan Note (Signed)
Controlled.  - Refilled medications - labs pending

## 2015-11-01 NOTE — Assessment & Plan Note (Signed)
Stable. Refilled medications 

## 2015-11-01 NOTE — Assessment & Plan Note (Signed)
Is willing to quit now that she has a new grandchild on the way  Has quit previously with chantix  - called in the start pack to the pharmacy  - sent in the prescription once the start pack is done  - I originally sent in patches but called and cancelled these.

## 2015-11-02 NOTE — Telephone Encounter (Signed)
Prior Authorization received from [employer] for [employer] and [employer]. Formulary and PA form placed in provider box for completion. Derl Barrow, RN

## 2015-11-06 MED ORDER — CLONAZEPAM 0.5 MG PO TABS: 0.5000 mg | ORAL_TABLET | Freq: Two times a day (BID) | ORAL | Status: DC | PRN

## 2015-11-06 MED ORDER — OMEPRAZOLE 20 MG PO CPDR: 20.0000 mg | DELAYED_RELEASE_CAPSULE | Freq: Every day | ORAL | Status: DC

## 2015-11-06 MED ORDER — BUTALBITAL-APAP-CAFFEINE 50-325-40 MG PO TABS: 1.0000 | ORAL_TABLET | Freq: Every day | ORAL | Status: DC | PRN

## 2015-11-06 MED ORDER — ATORVASTATIN CALCIUM 40 MG PO TABS: 40.0000 mg | ORAL_TABLET | Freq: Every day | ORAL | Status: DC

## 2015-11-06 NOTE — Telephone Encounter (Signed)
Spoke with patient about her prior authorizations. Will change her crestor to lipitor. She is no longer having nausea and vomiting so will not continue the omeprazole/sodium bicarbonate combination but will continue omeprazole.   I will place printed prescriptions up front. She is to send her neurologist note's as well.   Rosemarie Ax, MD PGY-3, [facility] 11/06/2015, 9:50 AM

## 2016-03-05 ENCOUNTER — Telehealth: Payer: Self-pay | Admitting: Family Medicine

## 2016-03-05 NOTE — Telephone Encounter (Signed)
AFTER HOURS LINE  This morning the patient noted nasal drainage, cough,  vomiting, stomach cramping, diarrhea that has gotten progressively worse. Temperature of 101. Unable to keep food down. She's been able to keep water down. She's had diarrhea that is yellow/orange. She denies myalgias. She is short of breath which has been going on for a few hours. No chest pain.   She took phenergan this morning which helped some, but then the nausea wore off. She has a h/o restrictive lung disease. She used albuterol twice today with some improvement.   Her brother and mother have had the flu.   Discussed that she most likely have a viral illness given her constellation of symptoms, however given her SOB and h/o lung disease, would recommend going to the ED vs urgent care for further evaluation. Patient in agreement.  Archie Patten, MD [facility] Resident  03/05/2016, 7:04 PM

## 2016-05-22 ENCOUNTER — Telehealth: Payer: Self-pay | Admitting: Student

## 2016-05-22 NOTE — Telephone Encounter (Signed)
Patient called because she has been having abdominal pain with back pain for the last two days. She reports gross blood in her urine. She has had nausea but no emesis associated. She denies ever having a kidney stone before. Patient counseled to go to the ED for evaluation

## 2016-05-26 ENCOUNTER — Encounter: Payer: Self-pay | Admitting: Family Medicine

## 2016-05-26 ENCOUNTER — Ambulatory Visit (INDEPENDENT_AMBULATORY_CARE_PROVIDER_SITE_OTHER): Payer: Medicaid Other | Admitting: Family Medicine

## 2016-05-26 VITALS — Temp 97.6°F | Wt 136.2 lb

## 2016-05-26 DIAGNOSIS — Z7189 Other specified counseling: Secondary | ICD-10-CM

## 2016-05-26 DIAGNOSIS — G2581 Restless legs syndrome: Secondary | ICD-10-CM | POA: Diagnosis not present

## 2016-05-26 DIAGNOSIS — G8929 Other chronic pain: Secondary | ICD-10-CM

## 2016-05-26 DIAGNOSIS — M549 Dorsalgia, unspecified: Secondary | ICD-10-CM

## 2016-05-26 DIAGNOSIS — R300 Dysuria: Secondary | ICD-10-CM | POA: Diagnosis present

## 2016-05-26 DIAGNOSIS — G4700 Insomnia, unspecified: Secondary | ICD-10-CM | POA: Diagnosis not present

## 2016-05-26 LAB — POCT URINALYSIS DIPSTICK
Bilirubin, UA: NEGATIVE
Glucose, UA: NEGATIVE
KETONES UA: NEGATIVE
LEUKOCYTES UA: NEGATIVE
Nitrite, UA: POSITIVE
PROTEIN UA: NEGATIVE
UROBILINOGEN UA: 0.2
pH, UA: 5.5

## 2016-05-26 MED ORDER — HYDROCODONE-ACETAMINOPHEN 10-325 MG PO TABS: 1.0000 | ORAL_TABLET | Freq: Four times a day (QID) | ORAL | Status: DC | PRN

## 2016-05-26 MED ORDER — ZOLPIDEM TARTRATE 10 MG PO TABS: 10.0000 mg | ORAL_TABLET | Freq: Every evening | ORAL | Status: DC | PRN

## 2016-05-26 MED ORDER — CLONAZEPAM 0.5 MG PO TABS: 0.5000 mg | ORAL_TABLET | Freq: Two times a day (BID) | ORAL | Status: DC | PRN

## 2016-05-26 NOTE — Patient Instructions (Signed)
Thank you for coming in,   I will call or send a letter with the results from today.   Please bring all of your medications with you to each visit.   Health maintenance items that are due.  Health Maintenance  Topic Date Due  .  Hepatitis C: One time screening is recommended by Center for Disease Control  (CDC) for  adults born from [birth year] through 1963.   [birth year]  . Mammogram  04/08/2014  . Pap Smear  02/22/2033*  . Flu Shot  07/01/2016  . Tetanus Vaccine  03/22/2023  . Colon Cancer Screening  04/10/2024  . HIV Screening  Completed  *Topic was postponed. The date shown is not the original due date.     Sign up for My Chart to have easy access to your labs results, and communication with your Primary care physician   Please feel free to call with any questions or concerns at any time, at [phone]. --Dr. Raeford Razor

## 2016-05-26 NOTE — Progress Notes (Signed)
   Subjective:    Diana [name] - 60 y.o. female MRN [id number]  Date of birth: [birth year]  CC UTI  HPI  Diana [name] is here for UTI.   She went to [facility] ED for hematuria on 6/22.  She was diagnosed with UTI and placed on cirpofloxicin.  She could also have kidney stones.  Having some pain with urination and having pelvic pressure.  Not having any fevers, chills or night sweats.  Has back pain but chronic for her.  She has a history of UTI and kidney stone one time.   Indication for chronic opioid: chronic back pain  Medication and dose: Norco  # pills per month: 80 Last UDS date: October 10/15 Pain contract signed (Y/N): y Date narcotic database last reviewed (include red flags): Concerning that she is on Klonopin for restless leg and also taking She is unable to provide the notes from her Neurologist.  Reports she is taking Fioricet for migraines.   PMH: Chronic low back pain, RLS, anxiety, depression, HTN, HLD, insomnia,  SH: current smoker  FH: HTN  Health Maintenance:  Health Maintenance Due  Topic Date Due  . Hepatitis C Screening  Jan 16, 1956  . MAMMOGRAM  04/08/2014    Review of Systems See HPI     Objective:   Physical Exam Temp(Src) 97.6 F (36.4 C) (Oral)  Wt 136 lb 3.2 oz (61.78 kg) Gen: NAD, alert, cooperative with exam, CV: RRR, good S1/S2, no murmur, no edema,  Resp: CTABL, no wheezes, non-labored Abd: SNTND, BS present, no guarding or organomegaly, no suprapubic tenderness  Skin: no rashes, normal turgor  Neuro: no gross deficits MSK: no CVA tenderness     Assessment & Plan:   Encounter for chronic pain management Renewed oxycodone, clonazepam and ambien today  Will not continue Fiorecet as she reports her Neurologist "closed up shop"  Had a long discussion that she is at an increased risk for falls and fractures with the medications that she is taking.  Counseled that she will need to be titrated down or off in the coming months.    Dysuria Reports she went to [facility] and she was diagnosed with an UTI  Has been taking Cipro  No febrile or having rigors.  Encouraged to call the ED and make sure the culture (if it was cultured) is sensitive to the Antibiotic that she is taking.  - UA today

## 2016-05-27 ENCOUNTER — Telehealth: Payer: Self-pay | Admitting: Family Medicine

## 2016-05-27 MED ORDER — FLUCONAZOLE 150 MG PO TABS: 150.0000 mg | ORAL_TABLET | Freq: Once | ORAL | Status: DC

## 2016-05-27 NOTE — Telephone Encounter (Signed)
Spoke with patient about her UA. She was seen in the [facility] ED and treated for UTI. Suggested that she call for her results to she is a culture was taken and if she is on the appropriate medication. Diflucan sent in as she gets yeast infections as being on antibiotics.   Rosemarie Ax, MD PGY-3, [facility] 05/27/2016, 1:40 PM

## 2016-05-27 NOTE — Telephone Encounter (Signed)
Spoke with patient and her culture grew E coli and sensitive to cipro.   Rosemarie Ax, MD PGY-3, [facility] 05/27/2016, 4:56 PM

## 2016-05-29 ENCOUNTER — Encounter: Payer: Self-pay | Admitting: Family Medicine

## 2016-05-29 DIAGNOSIS — R300 Dysuria: Secondary | ICD-10-CM | POA: Insufficient documentation

## 2016-05-29 NOTE — Assessment & Plan Note (Addendum)
Reports she went to [facility] and she was diagnosed with an UTI  Has been taking Cipro  No febrile or having rigors.  Encouraged to call the ED and make sure the culture (if it was cultured) is sensitive to the Antibiotic that she is taking.  - UA today

## 2016-05-29 NOTE — Assessment & Plan Note (Signed)
Renewed oxycodone, clonazepam and ambien today  Will not continue Fiorecet as she reports her Neurologist "closed up shop"  Had a long discussion that she is at an increased risk for falls and fractures with the medications that she is taking.  Counseled that she will need to be titrated down or off in the coming months.

## 2017-01-12 ENCOUNTER — Encounter: Payer: Self-pay | Admitting: Gastroenterology

## 2017-02-15 DIAGNOSIS — Z720 Tobacco use: Secondary | ICD-10-CM | POA: Diagnosis not present

## 2017-02-15 DIAGNOSIS — E875 Hyperkalemia: Secondary | ICD-10-CM | POA: Diagnosis not present

## 2017-02-15 DIAGNOSIS — J441 Chronic obstructive pulmonary disease with (acute) exacerbation: Secondary | ICD-10-CM | POA: Diagnosis not present

## 2017-02-15 DIAGNOSIS — R0602 Shortness of breath: Secondary | ICD-10-CM | POA: Diagnosis not present

## 2017-02-15 DIAGNOSIS — K219 Gastro-esophageal reflux disease without esophagitis: Secondary | ICD-10-CM | POA: Diagnosis not present

## 2017-02-16 DIAGNOSIS — J441 Chronic obstructive pulmonary disease with (acute) exacerbation: Secondary | ICD-10-CM | POA: Diagnosis not present

## 2017-02-16 DIAGNOSIS — K219 Gastro-esophageal reflux disease without esophagitis: Secondary | ICD-10-CM | POA: Diagnosis not present

## 2017-02-16 DIAGNOSIS — E875 Hyperkalemia: Secondary | ICD-10-CM | POA: Diagnosis not present

## 2017-02-16 DIAGNOSIS — Z720 Tobacco use: Secondary | ICD-10-CM | POA: Diagnosis not present

## 2017-02-17 DIAGNOSIS — J441 Chronic obstructive pulmonary disease with (acute) exacerbation: Secondary | ICD-10-CM | POA: Diagnosis not present

## 2017-02-17 DIAGNOSIS — E875 Hyperkalemia: Secondary | ICD-10-CM | POA: Diagnosis not present

## 2017-02-17 DIAGNOSIS — Z720 Tobacco use: Secondary | ICD-10-CM | POA: Diagnosis not present

## 2017-02-17 DIAGNOSIS — K219 Gastro-esophageal reflux disease without esophagitis: Secondary | ICD-10-CM | POA: Diagnosis not present

## 2017-02-19 ENCOUNTER — Encounter: Payer: Self-pay | Admitting: Family Medicine

## 2017-02-19 ENCOUNTER — Ambulatory Visit (INDEPENDENT_AMBULATORY_CARE_PROVIDER_SITE_OTHER): Payer: Medicaid Other | Admitting: Family Medicine

## 2017-02-19 VITALS — BP 172/92 | HR 84 | Temp 97.3°F | Ht 61.0 in | Wt 134.2 lb

## 2017-02-19 DIAGNOSIS — J441 Chronic obstructive pulmonary disease with (acute) exacerbation: Secondary | ICD-10-CM

## 2017-02-19 DIAGNOSIS — I10 Essential (primary) hypertension: Secondary | ICD-10-CM | POA: Diagnosis not present

## 2017-02-19 MED ORDER — TIOTROPIUM BROMIDE MONOHYDRATE 18 MCG IN CAPS: 18.0000 ug | ORAL_CAPSULE | Freq: Every day | RESPIRATORY_TRACT | 12 refills | Status: DC

## 2017-02-19 NOTE — Progress Notes (Signed)
   Subjective:   RIANNON MUKHERJEE is a 61 y.o. female with a history of HTN, CKD, depression, anxiety, IBS here for same day appointment for  Chief Complaint  Patient presents with  . Shortness of Breath     Patient was seen at [place] last week and then ED ([place], not on Epic) 02/15/17 for SOB and COPD exacerbation.  She was admitted and treated with IV abx and steroids as well as scheduled neb treatments.  She wqas discharged 3/20 with 7 more days of doxycycline and 12 day taper of Prednisone (from 50mg  daily to 30mg  to 10mg  and then off).  She reports good compliqance with these medications.  She continues to have SOB, wheezing, and cough.  Her symptoms are getting better slightly, but very slowly.  She is taking albuterol daily and has never been on controller medication.  Her BP was noted to be elevated today.  She reports it was >361 systolic when she was admitted to hospital and it was slowly brought down.  She denies CP, vision changes, HA.  Review of Systems:  Per HPI.   Social History: current smoker - only smoking 2 cig/day currently, but 1PPD prior to hospitalization  Objective:  BP (!) 172/92   Pulse 84   Temp 97.3 F (36.3 C) (Oral)   Ht 5\' 1"  (1.549 m)   Wt 134 lb 3.2 oz (60.9 kg)   SpO2 94%   BMI 25.36 kg/m   Gen:  61 y.o. female in NAD, coughing intermittently HEENT: NCAT, MMM, EOMI, PERRL, anicteric sclerae, OP clear, TMs clear b/l CV: RRR, no MRG Resp: Non-labored, Diffuse rhonchorus breath sounds with expiratory wheeze.  Good air movement throughout Ext: WWP, no edema MSK: No obvious deformities, gait intact Neuro: Alert and oriented, speech normal Psych: Appropriate groom and dress, no AVH      Assessment & Plan:     LATOYIA TECSON is a 61 y.o. female here for   COPD exacerbation ([place]) Slowly improving Continue Prednisone and doxycycline Advised regular albuterol inhalation q4 h for next 2 days and then taper off Needs controller medication as she was  hospitalized with exacerbation (she is cat C or D COPD) Start Spiriva f/u next week Consider adding LABA for better control Return precautions discussed  Hypertension, essential, benign Uncontrolled today, asymptomatic Sounds as though patient had accelerated HTN (unclear from history if this was urgency or emergency) during recent hospitalization, so it is ok for it to be slowly lowered and value today is likely appropriate decrease f/u next week and consider changing antiHTN therapy to improve control   Virginia Crews, MD MPH PGY-3,  [facility] 02/20/2017  9:14 AM

## 2017-02-19 NOTE — Patient Instructions (Signed)
Nice to meet you today. Start spiriva daily to help control your COPD. Continue the prednisone and doxycycline prescribed to you from the hospital.  Use your albuterol inhaler regularly every 6 hours for the next 2 days and then slowly taper down to using only as you needed. If your breathing worsens, please go to the emergency department. Please make a follow-up appointment early next week so that we can check on how your breathing is doing.  Take care, Dr. Jacinto Reap

## 2017-02-20 NOTE — Assessment & Plan Note (Signed)
Slowly improving Continue Prednisone and doxycycline Advised regular albuterol inhalation q4 h for next 2 days and then taper off Needs controller medication as she was hospitalized with exacerbation (she is cat C or D COPD) Start Spiriva f/u next week Consider adding LABA for better control Return precautions discussed

## 2017-02-20 NOTE — Assessment & Plan Note (Signed)
Uncontrolled today, asymptomatic Sounds as though patient had accelerated HTN (unclear from history if this was urgency or emergency) during recent hospitalization, so it is ok for it to be slowly lowered and value today is likely appropriate decrease f/u next week and consider changing antiHTN therapy to improve control

## 2017-02-24 ENCOUNTER — Ambulatory Visit (INDEPENDENT_AMBULATORY_CARE_PROVIDER_SITE_OTHER): Payer: Medicaid Other | Admitting: Family Medicine

## 2017-02-24 ENCOUNTER — Encounter: Payer: Self-pay | Admitting: Family Medicine

## 2017-02-24 VITALS — BP 184/98 | HR 75 | Temp 98.5°F | Ht 61.0 in | Wt 136.2 lb

## 2017-02-24 DIAGNOSIS — J441 Chronic obstructive pulmonary disease with (acute) exacerbation: Secondary | ICD-10-CM

## 2017-02-24 DIAGNOSIS — I10 Essential (primary) hypertension: Secondary | ICD-10-CM | POA: Diagnosis present

## 2017-02-24 MED ORDER — BENZONATATE 100 MG PO CAPS: 100.0000 mg | ORAL_CAPSULE | Freq: Two times a day (BID) | ORAL | 0 refills | Status: DC | PRN

## 2017-02-24 MED ORDER — FLUCONAZOLE 150 MG PO TABS: 150.0000 mg | ORAL_TABLET | Freq: Once | ORAL | 0 refills | Status: AC

## 2017-02-24 MED ORDER — TRIAMTERENE-HCTZ 37.5-25 MG PO TABS: 1.0000 | ORAL_TABLET | Freq: Every day | ORAL | 3 refills | Status: DC

## 2017-02-24 MED ORDER — PRAVASTATIN SODIUM 20 MG PO TABS: 10.0000 mg | ORAL_TABLET | Freq: Every day | ORAL | Status: DC

## 2017-02-24 NOTE — Patient Instructions (Signed)
Thank you so much for coming to visit today! I'm glad you are starting to feel better! I have refilled your Triamterine-HCTZ. Please call [phone] if you develop a severe headache, blurred vision, chest pain, or weakness/numbness. Please return as scheduled. I have sent a prescription for Tessalon and Diflucan to the pharmacy. Please continue to work on quitting smoking  Dr. Gerlean Ren   Hypertension Hypertension, commonly called high blood pressure, is when the force of blood pumping through the arteries is too strong. The arteries are the blood vessels that carry blood from the heart throughout the body. Hypertension forces the heart to work harder to pump blood and may cause arteries to become narrow or stiff. Having untreated or uncontrolled hypertension can cause heart attacks, strokes, kidney disease, and other problems. A blood pressure reading consists of a higher number over a lower number. Ideally, your blood pressure should be below 120/80. The first ("top") number is called the systolic pressure. It is a measure of the pressure in your arteries as your heart beats. The second ("bottom") number is called the diastolic pressure. It is a measure of the pressure in your arteries as the heart relaxes. What are the causes? The cause of this condition is not known. What increases the risk? Some risk factors for high blood pressure are under your control. Others are not. Factors you can change   Smoking.  Having type 2 diabetes mellitus, high cholesterol, or both.  Not getting enough exercise or physical activity.  Being overweight.  Having too much fat, sugar, calories, or salt (sodium) in your diet.  Drinking too much alcohol. Factors that are difficult or impossible to change   Having chronic kidney disease.  Having a family history of high blood pressure.  Age. Risk increases with age.  Race. You may be at higher risk if you are African-American.  Gender. Men are at higher risk  than women before age 34. After age 46, women are at higher risk than men.  Having obstructive sleep apnea.  Stress. What are the signs or symptoms? Extremely high blood pressure (hypertensive crisis) may cause:  Headache.  Anxiety.  Shortness of breath.  Nosebleed.  Nausea and vomiting.  Severe chest pain.  Jerky movements you cannot control (seizures). How is this diagnosed? This condition is diagnosed by measuring your blood pressure while you are seated, with your arm resting on a surface. The cuff of the blood pressure monitor will be placed directly against the skin of your upper arm at the level of your heart. It should be measured at least twice using the same arm. Certain conditions can cause a difference in blood pressure between your right and left arms. Certain factors can cause blood pressure readings to be lower or higher than normal (elevated) for a short period of time:  When your blood pressure is higher when you are in a health care provider's office than when you are at home, this is called white coat hypertension. Most people with this condition do not need medicines.  When your blood pressure is higher at home than when you are in a health care provider's office, this is called masked hypertension. Most people with this condition may need medicines to control blood pressure. If you have a high blood pressure reading during one visit or you have normal blood pressure with other risk factors:  You may be asked to return on a different day to have your blood pressure checked again.  You may be asked to  monitor your blood pressure at home for 1 week or longer. If you are diagnosed with hypertension, you may have other blood or imaging tests to help your health care provider understand your overall risk for other conditions. How is this treated? This condition is treated by making healthy lifestyle changes, such as eating healthy foods, exercising more, and reducing  your alcohol intake. Your health care provider may prescribe medicine if lifestyle changes are not enough to get your blood pressure under control, and if:  Your systolic blood pressure is above 130.  Your diastolic blood pressure is above 80. Your personal target blood pressure may vary depending on your medical conditions, your age, and other factors. Follow these instructions at home: Eating and drinking   Eat a diet that is high in fiber and potassium, and low in sodium, added sugar, and fat. An example eating plan is called the DASH (Dietary Approaches to Stop Hypertension) diet. To eat this way:  Eat plenty of fresh fruits and vegetables. Try to fill half of your plate at each meal with fruits and vegetables.  Eat whole grains, such as whole wheat pasta, brown rice, or whole grain bread. Fill about one quarter of your plate with whole grains.  Eat or drink low-fat dairy products, such as skim milk or low-fat yogurt.  Avoid fatty cuts of meat, processed or cured meats, and poultry with skin. Fill about one quarter of your plate with lean proteins, such as fish, chicken without skin, beans, eggs, and tofu.  Avoid premade and processed foods. These tend to be higher in sodium, added sugar, and fat.  Reduce your daily sodium intake. Most people with hypertension should eat less than 1,500 mg of sodium a day.  Limit alcohol intake to no more than 1 drink a day for nonpregnant women and 2 drinks a day for men. One drink equals 12 oz of beer, 5 oz of wine, or 1 oz of hard liquor. Lifestyle   Work with your health care provider to maintain a healthy body weight or to lose weight. Ask what an ideal weight is for you.  Get at least 30 minutes of exercise that causes your heart to beat faster (aerobic exercise) most days of the week. Activities may include walking, swimming, or biking.  Include exercise to strengthen your muscles (resistance exercise), such as pilates or lifting weights, as  part of your weekly exercise routine. Try to do these types of exercises for 30 minutes at least 3 days a week.  Do not use any products that contain nicotine or tobacco, such as cigarettes and e-cigarettes. If you need help quitting, ask your health care provider.  Monitor your blood pressure at home as told by your health care provider.  Keep all follow-up visits as told by your health care provider. This is important. Medicines   Take over-the-counter and prescription medicines only as told by your health care provider. Follow directions carefully. Blood pressure medicines must be taken as prescribed.  Do not skip doses of blood pressure medicine. Doing this puts you at risk for problems and can make the medicine less effective.  Ask your health care provider about side effects or reactions to medicines that you should watch for. Contact a health care provider if:  You think you are having a reaction to a medicine you are taking.  You have headaches that keep coming back (recurring).  You feel dizzy.  You have swelling in your ankles.  You have trouble with  your vision. Get help right away if:  You develop a severe headache or confusion.  You have unusual weakness or numbness.  You feel faint.  You have severe pain in your chest or abdomen.  You vomit repeatedly.  You have trouble breathing. Summary  Hypertension is when the force of blood pumping through your arteries is too strong. If this condition is not controlled, it may put you at risk for serious complications.  Your personal target blood pressure may vary depending on your medical conditions, your age, and other factors. For most people, a normal blood pressure is less than 120/80.  Hypertension is treated with lifestyle changes, medicines, or a combination of both. Lifestyle changes include weight loss, eating a healthy, low-sodium diet, exercising more, and limiting alcohol. This information is not intended  to replace advice given to you by your health care provider. Make sure you discuss any questions you have with your health care provider. Document Released: 11/17/2005 Document Revised: 10/15/2016 Document Reviewed: 10/15/2016 Elsevier Interactive Patient Education  2017 [employer].

## 2017-02-24 NOTE — Progress Notes (Signed)
Subjective:     Patient ID: Diana [name], female   DOB: 1956/10/31, 61 y.o.   MRN: [id number]  HPI Diana [name] is a 61 year old female presenting today for follow-up of COPD exacerbation and hypertension. Last seen on March 22 for COPD exacerbation. Was given course of prednisone and doxycycline which is still taking. Is given Spiriva which she tried, however she noted welts and hives following use. Blood pressure last office visit 172/92. Today notes improvement of COPD, denying shortness of breath and wheezing. Does request Tessalon for cough. Also requests prescription for Diflucan to use at completion of doxycycline for anticipated yeast infection. Continues to smoke half pack daily blood pressure elevated today, however denies headache, blurred vision, weakness, chest pain, shortness of breath. Has been out of blood pressure medicine for several days.  Review of Systems Per history of present illness    Objective:   Physical Exam  Constitutional: She appears well-developed and well-nourished. No distress.  Cardiovascular: Normal rate and regular rhythm.   No murmur heard. Pulmonary/Chest: Effort normal. No respiratory distress. She has no wheezes.  Musculoskeletal: She exhibits no edema.  Skin: No rash noted.  Psychiatric: She has a normal mood and affect. Her behavior is normal.      Assessment and Plan:     1. Essential hypertension, benign Blood pressure elevated today to 192/98 initially, improved to 184/98 on recheck. Has been out of medication for several days. Triamterene HCTZ refilled. Return precautions given. To check blood pressure daily at home. To follow-up as scheduled with Diana [name] on April 4.  2. COPD exacerbation ([place]) Resolved. Complete course of doxycycline and prednisone. Spiriva discontinued given allergic reaction and added to allergy list. Follow up with PCP.

## 2017-03-04 ENCOUNTER — Encounter: Payer: Self-pay | Admitting: Family Medicine

## 2017-03-04 ENCOUNTER — Ambulatory Visit (INDEPENDENT_AMBULATORY_CARE_PROVIDER_SITE_OTHER): Payer: Medicaid Other | Admitting: Family Medicine

## 2017-03-04 VITALS — BP 122/62 | HR 85 | Temp 98.4°F | Ht 61.0 in | Wt 132.6 lb

## 2017-03-04 DIAGNOSIS — G43109 Migraine with aura, not intractable, without status migrainosus: Secondary | ICD-10-CM

## 2017-03-04 DIAGNOSIS — J449 Chronic obstructive pulmonary disease, unspecified: Secondary | ICD-10-CM | POA: Diagnosis not present

## 2017-03-04 DIAGNOSIS — G8929 Other chronic pain: Secondary | ICD-10-CM | POA: Diagnosis not present

## 2017-03-04 DIAGNOSIS — M79604 Pain in right leg: Secondary | ICD-10-CM | POA: Diagnosis not present

## 2017-03-04 DIAGNOSIS — Z1159 Encounter for screening for other viral diseases: Secondary | ICD-10-CM | POA: Diagnosis not present

## 2017-03-04 DIAGNOSIS — I10 Essential (primary) hypertension: Secondary | ICD-10-CM | POA: Diagnosis present

## 2017-03-04 MED ORDER — TIOTROPIUM BROMIDE-OLODATEROL 2.5-2.5 MCG/ACT IN AERS: 2.0000 | INHALATION_SPRAY | Freq: Every day | RESPIRATORY_TRACT | 3 refills | Status: DC

## 2017-03-04 MED ORDER — PROPRANOLOL HCL 40 MG PO TABS: 40.0000 mg | ORAL_TABLET | Freq: Three times a day (TID) | ORAL | 1 refills | Status: DC

## 2017-03-04 MED ORDER — TRAMADOL HCL 50 MG PO TABS: 50.0000 mg | ORAL_TABLET | Freq: Three times a day (TID) | ORAL | 0 refills | Status: DC | PRN

## 2017-03-04 NOTE — Assessment & Plan Note (Signed)
Discontinued norco, flexeril, ambien. Will replace with tramadol and attempt to wean this as well.

## 2017-03-04 NOTE — Assessment & Plan Note (Signed)
Present off and on for several years, recently  recurred. Patient thinks this could be related to her statin, although I think this is very unlikely given that it's unilateral and localized to right lower leg. Minimal concern for DVT as patient has never had a history of clot, no cords, no palpable calf tenderness, no recent travel or immobilization. Given extensive smoking history and reports of increased pain with walking, will refer to vascular surgery for likely PAD. Pulses difficult to appreciate on exam bilaterally, bilateral lower extremities school.

## 2017-03-04 NOTE — Assessment & Plan Note (Signed)
Discontinued fioricet. Recommended decreased excedrin use. Patient denies recent neurologist appointment. Will start propranolol as prophylaxis and closely monitor BP. Given several other issues discussed today, will follow up migraines next visit in 1 month.

## 2017-03-04 NOTE — Assessment & Plan Note (Signed)
Patient with recent exacerbation. No O2 needs. Was rx'd spirva, but patient reports rash with this. Discussed with pharmD, will try Stiolto as this works with her insurance. Patient given teaching by pharmacy students.

## 2017-03-04 NOTE — Patient Instructions (Signed)
It was a pleasure to see you today! Thank you for choosing [facility] for your primary care. Diana [name] was seen for blood pressure, migraines, and leg pain. Come back to the clinic to recheck your blood pressure in 1 month, and we can check on your migraines then as well. Try the new medicine for your migraines, and try to decrease use of Excedrin. Please schedule your mammogram.  Best,  Dr. Lindell Noe

## 2017-03-04 NOTE — Progress Notes (Signed)
CC: follow up BP  HPI HTN: BP elevated at last visit, patient reports that at that time she had been vomiting and was unable to keep her by mouth medicines down. Since, has started triamterene and HCTZ and blood pressure is appropriate today.  Right leg pain: Intermittent for several years, worsened over the past week and a half, which she feels is related to her statin, which she started 1.5 weeks ago. Notes that it feels like bone pain at times, worse at night, worse with walking. No swelling, rashes, no history of clots.  COPD: Reports a rash with three-vessel, discontinued this and would like an additional inhaler. Still smoking one half pack per day, but this is decreased from recent years.   Multiple sedating medicines - had flexeril, ambien, norco, klonipin, fioricet and vistaril on her med list. She reports that she has some norco, ambien, and vistaril left. She uses Vistaril for sleep, only occasionally uses Ambien. Is out of Flexeril, and uses Norco and Klonopin for her chronic back pain and restless legs respectively.  CC, SH/smoking status, and VS noted  Objective: BP 122/62   Pulse 85   Temp 98.4 F (36.9 C) (Oral)   Ht 5\' 1"  (1.549 m)   Wt 132 lb 9.6 oz (60.1 kg)   SpO2 99%   BMI 25.05 kg/m  Gen: NAD, alert, cooperative, and pleasant female.  HEENT: NCAT, EOMI, PERRL CV: RRR, no murmur Resp: prolonged expiratory phase, but clear, no wheezes, non-labored Abd: SNTND, BSare  present, no guarding or organomegaly Ext: No edema, warm. Unable to appreciate DP or PT pulses bilaterally.  Neuro: Alert and oriented, Speech clear, No gross deficits  Assessment and plan:  Hypertension, essential, benign At goal w maxide, will check electrolytes today. Will closely monitor BP as adding low dose propranolol for migraines.   Migraine headache with aura Discontinued fioricet. Recommended decreased excedrin use. Patient denies recent neurologist appointment. Will start  propranolol as prophylaxis and closely monitor BP. Given several other issues discussed today, will follow up migraines next visit in 1 month.   Encounter for chronic pain management Discontinued norco, flexeril, ambien. Will replace with tramadol and attempt to wean this as well.   Right leg pain Present off and on for several years, recently  recurred. Patient thinks this could be related to her statin, although I think this is very unlikely given that it's unilateral and localized to right lower leg. Minimal concern for DVT as patient has never had a history of clot, no cords, no palpable calf tenderness, no recent travel or immobilization. Given extensive smoking history and reports of increased pain with walking, will refer to vascular surgery for likely PAD. Pulses difficult to appreciate on exam bilaterally, bilateral lower extremities school.   Chronic obstructive pulmonary disease ([place]) Patient with recent exacerbation. No O2 needs. Was rx'd spirva, but patient reports rash with this. Discussed with pharmD, will try Stiolto as this works with her insurance. Patient given teaching by pharmacy students.    Orders Placed This Encounter  Procedures  . CBC  . Basic metabolic panel  . Hepatitis C antibody  . Ambulatory referral to Vascular Surgery    Referral Priority:   Routine    Referral Type:   Surgical    Referral Reason:   Specialty Services Required    Requested Specialty:   Vascular Surgery    Number of Visits Requested:   1    Meds ordered this encounter  Medications  . propranolol (  INDERAL) 40 MG tablet    Sig: Take 1 tablet (40 mg total) by mouth 3 (three) times daily.    Dispense:  90 tablet    Refill:  1  . traMADol (ULTRAM) 50 MG tablet    Sig: Take 1 tablet (50 mg total) by mouth every 8 (eight) hours as needed.    Dispense:  15 tablet    Refill:  0  . Tiotropium Bromide-Olodaterol (STIOLTO RESPIMAT) 2.5-2.5 MCG/ACT AERS    Sig: Inhale 2 puffs into the lungs  daily.    Dispense:  2 Inhaler    Refill:  3     [name], MD, PGY1 03/04/2017 4:51 PM

## 2017-03-04 NOTE — Assessment & Plan Note (Signed)
At goal w maxide, will check electrolytes today. Will closely monitor BP as adding low dose propranolol for migraines.

## 2017-03-05 LAB — BASIC METABOLIC PANEL
BUN/Creatinine Ratio: 14 (ref 9–23)
BUN: 10 mg/dL (ref 6–24)
CHLORIDE: 92 mmol/L — AB (ref 96–106)
CO2: 25 mmol/L (ref 18–29)
CREATININE: 0.71 mg/dL (ref 0.57–1.00)
Calcium: 8.9 mg/dL (ref 8.7–10.2)
GFR calc non Af Amer: 94 mL/min/{1.73_m2} (ref >59)
GFR, EST AFRICAN AMERICAN: 109 mL/min/{1.73_m2} (ref >59)
Glucose: 93 mg/dL (ref 65–99)
Potassium: 3 mmol/L — ABNORMAL LOW (ref 3.5–5.2)
Sodium: 136 mmol/L (ref 134–144)

## 2017-03-05 LAB — CBC
HEMATOCRIT: 38.9 % (ref 34.0–46.6)
HEMOGLOBIN: 13.3 g/dL (ref 11.1–15.9)
MCH: 29.1 pg (ref 26.6–33.0)
MCHC: 34.2 g/dL (ref 31.5–35.7)
MCV: 85 fL (ref 79–97)
Platelets: 161 10*3/uL (ref 150–379)
RBC: 4.57 x10E6/uL (ref 3.77–5.28)
RDW: 13.1 % (ref 12.3–15.4)
WBC: 7.6 10*3/uL (ref 3.4–10.8)

## 2017-03-05 LAB — HEPATITIS C ANTIBODY: Hep C Virus Ab: <0.1 s/co ratio (ref 0.0–0.9)

## 2017-03-06 ENCOUNTER — Telehealth: Payer: Self-pay | Admitting: Family Medicine

## 2017-03-06 DIAGNOSIS — E876 Hypokalemia: Secondary | ICD-10-CM

## 2017-03-06 MED ORDER — POTASSIUM CHLORIDE CRYS ER 20 MEQ PO TBCR: 20.0000 meq | EXTENDED_RELEASE_TABLET | Freq: Every day | ORAL | 0 refills | Status: DC

## 2017-03-06 NOTE — Telephone Encounter (Signed)
Potassium is low on HCTZ. Will supplement with 20 mEq daily, and recheck lab next week. Called patient and let her know that I sent this prescription in. Will place future order for BMP and made lab appt for such.

## 2017-03-10 ENCOUNTER — Other Ambulatory Visit: Payer: Self-pay | Admitting: Family Medicine

## 2017-03-10 ENCOUNTER — Other Ambulatory Visit: Payer: Medicaid Other

## 2017-03-10 DIAGNOSIS — E876 Hypokalemia: Secondary | ICD-10-CM

## 2017-03-11 LAB — BASIC METABOLIC PANEL
BUN / CREAT RATIO: 9 (ref 9–23)
BUN: 7 mg/dL (ref 6–24)
CO2: 28 mmol/L (ref 18–29)
Calcium: 9.3 mg/dL (ref 8.7–10.2)
Chloride: 92 mmol/L — ABNORMAL LOW (ref 96–106)
Creatinine, Ser: 0.77 mg/dL (ref 0.57–1.00)
GFR calc Af Amer: 98 mL/min/{1.73_m2} (ref >59)
GFR, EST NON AFRICAN AMERICAN: 85 mL/min/{1.73_m2} (ref >59)
GLUCOSE: 71 mg/dL (ref 65–99)
POTASSIUM: 3.2 mmol/L — AB (ref 3.5–5.2)
SODIUM: 138 mmol/L (ref 134–144)

## 2017-03-12 ENCOUNTER — Telehealth: Payer: Self-pay | Admitting: Family Medicine

## 2017-03-12 NOTE — Telephone Encounter (Signed)
Called patient to discuss K 3.2 on repeat BMP. She was compliant with the 65meQ Kdur QD, and endorsed some nausea with this. I instructed her to take the 46meQ BID until tomorrow afternoon when we will recheck her K again.

## 2017-03-13 ENCOUNTER — Encounter: Payer: Self-pay | Admitting: Family Medicine

## 2017-03-13 ENCOUNTER — Telehealth: Payer: Self-pay | Admitting: Internal Medicine

## 2017-03-13 ENCOUNTER — Ambulatory Visit (INDEPENDENT_AMBULATORY_CARE_PROVIDER_SITE_OTHER): Payer: Medicaid Other | Admitting: Family Medicine

## 2017-03-13 ENCOUNTER — Other Ambulatory Visit: Payer: Self-pay | Admitting: Internal Medicine

## 2017-03-13 VITALS — BP 140/82 | HR 88 | Temp 98.5°F | Ht 61.0 in | Wt 133.4 lb

## 2017-03-13 DIAGNOSIS — R509 Fever, unspecified: Secondary | ICD-10-CM | POA: Diagnosis not present

## 2017-03-13 DIAGNOSIS — L989 Disorder of the skin and subcutaneous tissue, unspecified: Secondary | ICD-10-CM | POA: Insufficient documentation

## 2017-03-13 DIAGNOSIS — E876 Hypokalemia: Secondary | ICD-10-CM

## 2017-03-13 DIAGNOSIS — J029 Acute pharyngitis, unspecified: Secondary | ICD-10-CM | POA: Diagnosis present

## 2017-03-13 DIAGNOSIS — I10 Essential (primary) hypertension: Secondary | ICD-10-CM | POA: Diagnosis not present

## 2017-03-13 LAB — POCT RAPID STREP A (OFFICE): Rapid Strep A Screen: NEGATIVE

## 2017-03-13 MED ORDER — AMLODIPINE BESYLATE 5 MG PO TABS: 5.0000 mg | ORAL_TABLET | Freq: Every day | ORAL | 3 refills | Status: DC

## 2017-03-13 MED ORDER — MUPIROCIN CALCIUM 2 % EX CREA: 1.0000 "application " | TOPICAL_CREAM | Freq: Two times a day (BID) | CUTANEOUS | 0 refills | Status: DC

## 2017-03-13 MED ORDER — MUPIROCIN 2 % EX OINT: 1.0000 "application " | TOPICAL_OINTMENT | Freq: Two times a day (BID) | CUTANEOUS | 0 refills | Status: DC

## 2017-03-13 NOTE — Telephone Encounter (Signed)
**  After Hours/ Emergency Line Call*  Received a call to report that Diana [name] was seen in clinic today. Her PCP prescribed her some Mupirocin ointment for a cut on her hand. It looks like the intranasal ointment was ordered. The pharmacist told her that she shouldn't use this on her skin. Patient was wondering if something else could be called in. I sent in a prescription for a topical mupirocin cream.  Will forward to PCP.  Evette Doffing, MD PGY-2, [place] [facility]

## 2017-03-13 NOTE — Progress Notes (Signed)
CC: pain everywhere  HPI  HTN: 118/87, higher in afternoons. Taking both maxide and propranolol. No dizziness, some mild lightheadedness (about 30 min after taking this).   Hand: scratched it on the door, about 2 weeks ago, sore, bruised. Tried neosporin and peroxide on it. Redness increasing x 2 days. Pain going up her arm. Temp was 101. Also having sore throat, increased cough, headache. Pain feels like burning. Normally no pain in this left hand.   Sore throat: present x 3 days, grandchildren with confirmed Strep throat.   Hypokalemia: took extra [place] as instructed. (66meQ daily x 2 days). Still feeling very nauseous with these. Feels this is due to her BP med.  CC, SH/smoking status, and VS noted  Objective: BP 140/82   Pulse 88   Temp 98.5 F (36.9 C) (Oral)   Ht 5\' 1"  (1.549 m)   Wt 133 lb 6.4 oz (60.5 kg)   SpO2 99%   BMI 25.21 kg/m  Gen: NAD, alert, cooperative.  HEENT: NCAT, EOMI, PERRL. Tonsils not well visualized, oropharynx erythematous.  CV: RRR, no murmur Resp: CTAB, no wheezes, non-labored Abd: SNTND, BS present, no guarding or organomegaly Ext: No edema, warm. L hand with 1cm lesion with 7mm surrounding erythema, TTP.  Neuro: Alert and oriented, Speech clear, No gross deficits  Assessment and plan:  Hypertension, essential, benign Persistent hypokalemia with maxide. Will recheck potassium today, although patient is not tolerating the K-Dur due to nausea. Will go ahead and discontinue potassium supplementation as well as Maxide, replaced with Norvasc 5 mg daily and recheck blood pressure in one month. Patient instructed to check blood pressure at home, give [place] a call if these are elevated.  Hypokalemia Likely due to [facility] as a blood pressure medicine, was giving potassium repletion prior to today, 40 mEq daily. Will recheck BMP today, but will also discontinue Maxide and K-Dur as patient is having nausea with K-Dur. Switching to Norvasc.  Skin lesion of  hand Present for 2 weeks, has been trying Neosporin and proximal to home. Reports pain that moves up her left arm. On exam, approximately 3 mm of surrounding erythema, no discharge. Low suspicion for cellulitis, recommended Bactroban to the area. Return if not improving.  Fever Patient reports one  fever at home of 100.1. Notes that both of her grandchildren have recently had strep throat. Strep throat swab is negative here. Counseled patient that this could be a virus. Patient with COPD, although no increased sputum production or decreased breath sounds. Low suspicion for COPD exacerbation or cellulitis, given hand sore. Counseled on over-the-counter pain and fever relief and return to clinic if worsened.   Orders Placed This Encounter  Procedures  . Basic metabolic panel  . POCT rapid strep A    Meds ordered this encounter  Medications  . amLODipine (NORVASC) 5 MG tablet    Sig: Take 1 tablet (5 mg total) by mouth daily.    Dispense:  90 tablet    Refill:  3  . mupirocin ointment (BACTROBAN) 2 %    Sig: Place 1 application into the nose 2 (two) times daily.    Dispense:  22 g    Refill:  0   Health Maintenance: needs mammogram, reminded her to schedule this today.  Of note, patient reports that she is "done" because we were unable to also address migraines today. I recommended that she follow up next week for migraines given time constraints. We did set an agenda for hand pain, sore  throat, and nausea due to [place]. She felt hand pain and sore throat were most pressing. Counseled her that we cannot safely care for additional issues in one visit.   [name], MD, PGY1 03/13/2017 3:53 PM

## 2017-03-13 NOTE — Assessment & Plan Note (Signed)
Likely due to [facility] as a blood pressure medicine, was giving potassium repletion prior to today, 40 mEq daily. Will recheck BMP today, but will also discontinue Maxide and K-Dur as patient is having nausea with K-Dur. Switching to Norvasc.

## 2017-03-13 NOTE — Assessment & Plan Note (Signed)
Persistent hypokalemia with maxide. Will recheck potassium today, although patient is not tolerating the K-Dur due to nausea. Will go ahead and discontinue potassium supplementation as well as Maxide, replaced with Norvasc 5 mg daily and recheck blood pressure in one month. Patient instructed to check blood pressure at home, give [place] a call if these are elevated.

## 2017-03-13 NOTE — Patient Instructions (Signed)
It was a pleasure to see you today! Thank you for choosing [facility] for your primary care. Diana [name] was seen for hand sore, sore throat, low potassium.   Our plans for today were:  We need to recheck your potassium with a Blood test. Stop taking the Maxzide blood pressure medicine and start the new blood pressure medicine called amlodipine. Do not take potassium anymore as well.  For the sore on your hand, stop using Neosporin, and peroxide. This area may be painful while the skin is healing. Try the cream I am giving you.  For your sore throat, try honey as well as cough drops. Let us know if you have any more fevers.   To keep you healthy, we need to monitor some screening tests. You are due for for your mammogram. Please schedule this.  You should return to our clinic to see me and in 1 month for blood pressure recheck.   Best,  Dr. Lindell Noe

## 2017-03-13 NOTE — Assessment & Plan Note (Signed)
Patient reports one  fever at home of 100.1. Notes that both of her grandchildren have recently had strep throat. Strep throat swab is negative here. Counseled patient that this could be a virus. Patient with COPD, although no increased sputum production or decreased breath sounds. Low suspicion for COPD exacerbation or cellulitis, given hand sore. Counseled on over-the-counter pain and fever relief and return to clinic if worsened.

## 2017-03-13 NOTE — Assessment & Plan Note (Signed)
Present for 2 weeks, has been trying Neosporin and proximal to home. Reports pain that moves up her left arm. On exam, approximately 3 mm of surrounding erythema, no discharge. Low suspicion for cellulitis, recommended Bactroban to the area. Return if not improving.

## 2017-03-14 LAB — BASIC METABOLIC PANEL
BUN / CREAT RATIO: 5 — AB (ref 9–23)
BUN: 4 mg/dL — ABNORMAL LOW (ref 6–24)
CO2: 27 mmol/L (ref 18–29)
CREATININE: 0.77 mg/dL (ref 0.57–1.00)
Calcium: 9.2 mg/dL (ref 8.7–10.2)
Chloride: 93 mmol/L — ABNORMAL LOW (ref 96–106)
GFR calc Af Amer: 98 mL/min/{1.73_m2} (ref >59)
GFR, EST NON AFRICAN AMERICAN: 85 mL/min/{1.73_m2} (ref >59)
Glucose: 73 mg/dL (ref 65–99)
Potassium: 3.5 mmol/L (ref 3.5–5.2)
SODIUM: 134 mmol/L (ref 134–144)

## 2017-03-16 ENCOUNTER — Telehealth: Payer: Self-pay | Admitting: Family Medicine

## 2017-03-16 NOTE — Telephone Encounter (Signed)
Called patient to let her know potassium is now normal. She denied questions.

## 2017-03-23 ENCOUNTER — Telehealth: Payer: Self-pay | Admitting: Family Medicine

## 2017-03-23 NOTE — Telephone Encounter (Signed)
Called pharmacy - medicaid only covers mucopiricin ointment and I had received a prior auth form. Changed rx to ointment.

## 2017-04-08 ENCOUNTER — Encounter: Payer: Self-pay | Admitting: Surgery

## 2017-04-09 ENCOUNTER — Ambulatory Visit: Payer: Medicaid Other | Admitting: Family Medicine

## 2017-04-10 ENCOUNTER — Other Ambulatory Visit: Payer: Self-pay

## 2017-04-10 DIAGNOSIS — I739 Peripheral vascular disease, unspecified: Secondary | ICD-10-CM

## 2017-04-10 DIAGNOSIS — M79604 Pain in right leg: Secondary | ICD-10-CM

## 2017-04-13 ENCOUNTER — Ambulatory Visit (HOSPITAL_COMMUNITY)
Admission: RE | Admit: 2017-04-13 | Discharge: 2017-04-13 | Disposition: A | Discharge status: Home / Self Care | Payer: Medicaid Other | Source: Ambulatory Visit | Attending: Surgery | Admitting: Surgery

## 2017-04-13 ENCOUNTER — Encounter: Payer: Self-pay | Admitting: Surgery

## 2017-04-13 ENCOUNTER — Ambulatory Visit (INDEPENDENT_AMBULATORY_CARE_PROVIDER_SITE_OTHER): Payer: Medicaid Other | Admitting: Surgery

## 2017-04-13 VITALS — BP 155/91 | HR 101 | Temp 99.3°F | Resp 20 | Ht 61.0 in | Wt 131.7 lb

## 2017-04-13 DIAGNOSIS — I739 Peripheral vascular disease, unspecified: Secondary | ICD-10-CM | POA: Diagnosis present

## 2017-04-13 DIAGNOSIS — M79604 Pain in right leg: Secondary | ICD-10-CM

## 2017-04-13 DIAGNOSIS — M7989 Other specified soft tissue disorders: Secondary | ICD-10-CM | POA: Diagnosis not present

## 2017-04-13 NOTE — Progress Notes (Signed)
[facility] [place]  Patient name: Diana [name] MRN: [id number] DOB: December 30, 1955 Sex: female   REFERRING PROVIDER:    Dr. Lindell Noe   REASON FOR CONSULT:    Leg pain and swelling  HISTORY OF PRESENT ILLNESS:   Diana [name] is a 61 y.o. female, who is Referred today for evaluation of leg pain and swelling.  She states that this has been going on for approximately 2-3 months.  She does have a history of restless leg syndrome.  She states that she will occasionally get pain at night which wakes her up and she will also get pain when standing still.  It appears to be worse after being on her feet or walking long distances.  Exercise does make it worse.  She does not have any open wounds.  She does complain of mild swelling and she can see her sock lines in her legs which is a new finding.  The patient is back to managed for hypercholesterolemia with a statin.  She is a current smoker.  She is medically managed for hypertension.  PAST MEDICAL HISTORY    Past Medical History:  Diagnosis Date  . Anxiety   . Asthma   . Barrett esophagus    "don't know that I've ever been stretched" (08/17/2013)  . Choledocholithiasis with acute cholecystitis   . Chronic lower back pain   . Craniosynostosis    congenital  . DDD (degenerative disc disease)   . Family history of anesthesia complication    "PONV; both parents" (08/17/2013)  . GERD (gastroesophageal reflux disease)   . H/O hiatal hernia   . History of blood transfusion 1959-1960  . Hypercholesterolemia 05/28/2012   Has taken Crestor and Lipitor in the past. Switched to lovastatin b/c its $4.    . Hypertension   . IBS (irritable bowel syndrome)   . Migraines    "since age 38; probably twice/month" (08/17/2013)  . PONV (postoperative nausea and vomiting)   . Rheumatoid arthritis ([place])    "in my knuckles" (08/17/2013)  . Ruptured lumbar disc 1990's   L4-L5  . Shingles       FAMILY HISTORY   Family History  Problem Relation Age of Onset  . Hypertension Mother   . Hypertension Father   . Skin cancer Father   . Colon cancer Paternal Grandfather     SOCIAL HISTORY:   Social History   Social History  . Marital status: Divorced    Spouse name: N/A  . Number of children: N/A  . Years of education: N/A   Occupational History  . Not on file.   Social History Main Topics  . Smoking status: Current Every Day Smoker    Packs/day: 1.00    Years: 32.00    Types: Cigarettes  . Smokeless tobacco: Never Used  . Alcohol use 0.0 oz/week     Comment: 08/17/2013 "couple mixed drinks/wk"  . Drug use: No  . Sexual activity: Not Currently   Other Topics Concern  . Not on file   Social History Narrative  . No narrative on file    ALLERGIES:    Allergies  Allergen Reactions  . Penicillins Anaphylaxis  . Demerol [Meperidine] Nausea And Vomiting  . Doxycycline Hyclate Nausea And Vomiting  . Gabapentin Other (See Comments)    Malaise  . Sulfa Antibiotics Hives  . Benadryl [Diphenhydramine Hcl] Rash  . Cephalexin Rash  . Morphine And Related Nausea And Vomiting  . Spiriva Handihaler [Tiotropium Bromide  Monohydrate] Rash    CURRENT MEDICATIONS:    Current Outpatient Prescriptions  Medication Sig Dispense Refill  . albuterol (PROVENTIL HFA;VENTOLIN HFA) 108 (90 BASE) MCG/ACT inhaler Inhale 2 puffs into the lungs every 6 (six) hours as needed for wheezing. 8.5 g 5  . amLODipine (NORVASC) 5 MG tablet Take 1 tablet (5 mg total) by mouth daily. 90 tablet 3  . benzonatate (TESSALON) 100 MG capsule Take 1 capsule (100 mg total) by mouth 2 (two) times daily as needed for cough. 20 capsule 0  . CALCIUM-MAGNESIUM-ZINC PO Take 1 tablet by mouth daily.     . Coenzyme Q10 (CO Q-10 PO) Take 1 capsule by mouth daily.     Marland Kitchen conjugated estrogens (PREMARIN) vaginal cream Place 1 Applicatorful vaginally at bedtime.    . diclofenac sodium (VOLTAREN) 1 % GEL  Apply 2 g topically 4 (four) times daily as needed. 100 g 5  . Ergocalciferol (VITAMIN D2) 2000 UNITS TABS Take 2,000 Units by mouth daily.    . hydrOXYzine (VISTARIL) 25 MG capsule Take 1 capsule (25 mg total) by mouth at bedtime as needed for itching. 60 capsule 1  . Multiple Vitamin (MULITIVITAMIN WITH MINERALS) TABS Take 1 tablet by mouth daily.    . mupirocin cream (BACTROBAN) 2 % Apply 1 application topically 2 (two) times daily. 15 g 0  . mupirocin ointment (BACTROBAN) 2 % Place 1 application into the nose 2 (two) times daily. 22 g 0  . omeprazole (PRILOSEC) 20 MG capsule Take 1 capsule (20 mg total) by mouth daily. 30 capsule 3  . pravastatin (PRAVACHOL) 20 MG tablet Take 0.5 tablets (10 mg total) by mouth daily.    . Probiotic Product (PROBIOTIC DAILY PO) Take 2 tablets by mouth daily.    . propranolol (INDERAL) 40 MG tablet Take 1 tablet (40 mg total) by mouth 3 (three) times daily. 90 tablet 1  . Tiotropium Bromide-Olodaterol (STIOLTO RESPIMAT) 2.5-2.5 MCG/ACT AERS Inhale 2 puffs into the lungs daily. 2 Inhaler 3  . traMADol (ULTRAM) 50 MG tablet Take 1 tablet (50 mg total) by mouth every 8 (eight) hours as needed. 15 tablet 0  . TURMERIC PO Take 1 tablet by mouth daily.      No current facility-administered medications for this visit.     REVIEW OF SYSTEMS:   [X]  denotes positive finding, [ ]  denotes negative finding Cardiac  Comments:  Chest pain or chest pressure:    Shortness of breath upon exertion:    Short of breath when lying flat:    Irregular heart rhythm:        Vascular    Pain in calf, thigh, or hip brought on by ambulation: x   Pain in feet at night that wakes you up from your sleep:  x   Blood clot in your veins:    Leg swelling:  x       Pulmonary    Oxygen at home:    Productive cough:  x   Wheezing:         Neurologic    Sudden weakness in arms or legs:     Sudden numbness in arms or legs:     Sudden onset of difficulty speaking or slurred speech:     Temporary loss of vision in one eye:     Problems with dizziness:         Gastrointestinal    Blood in stool:      Vomited blood:  Genitourinary    Burning when urinating:     Blood in urine:        Psychiatric    Major depression:         Hematologic    Bleeding problems:    Problems with blood clotting too easily:        Skin    Rashes or ulcers:        Constitutional    Fever or chills:     PHYSICAL EXAM:   Vitals:   04/13/17 1036  BP: (!) 155/91  Pulse: (!) 101  Resp: 20  Temp: 99.3 F (37.4 C)  TempSrc: Oral  SpO2: 100%  Weight: 131 lb 11.2 oz (59.7 kg)  Height: 5\' 1"  (1.549 m)    GENERAL: The patient is a well-nourished female, in no acute distress. The vital signs are documented above. CARDIAC: There is a regular rate and rhythm.  VASCULAR: Palpable posterior tibial pulses bilaterally.  Trace edema bilaterally PULMONARY: Nonlabored respirations  MUSCULOSKELETAL: There are no major deformities or cyanosis. NEUROLOGIC: No focal weakness or paresthesias are detected. SKIN: There are no ulcers or rashes noted. PSYCHIATRIC: The patient has a normal affect.  STUDIES:   I have reviewed her vascular lab studies.  Her ABI on the right is 1.1 with triphasic waveforms.  Her ABI on the left is 1.08 with triphasic waveforms  ASSESSMENT and PLAN   The patient does not have arterial insufficiency.  She has palpable pedal pulses and a normal ABI.  The patient may be suffering from a venous claudication as she does get worsening symptoms with activity which continued into the night.  In addition she does have swelling.  I have recommended that she try compression stockings to see if this helps alleviate any of her symptoms.  I also discussed the significance for smoking cessation.  The patient will follow-up with me on an as-needed basis.   Annamarie Major, MD [facility] [phone] Pager [phone]

## 2017-07-21 ENCOUNTER — Telehealth: Payer: Self-pay | Admitting: *Deleted

## 2017-07-21 NOTE — Telephone Encounter (Signed)
Can we call the patient and have her seen in our office in a same day appointment? I am not inclined to order a CT without someone from [facility] evaluating her. Thanks!

## 2017-07-21 NOTE — Telephone Encounter (Signed)
Contacted pt and gave her the below information and she has an appointment with dr. Ola Spurr on 8/22 @ 4:30pm for this. Routing to PCP as well as Dr. Ola Spurr for an [place]. Katharina Caper, Analyn Matusek D, [place]

## 2017-07-21 NOTE — Telephone Encounter (Signed)
Pt went to an urgent care in [place] last night (not [facility])  They want her to have a CT scan w/o contrast to see if she has kidney stones.   Since she has medicaid, this would have to come from her PCP.  Will forward to MD.    The number to call and authorize [facility] to do the exam would be [phone] opt 7.   Syed Zukas, [name], CMA

## 2017-07-22 ENCOUNTER — Ambulatory Visit (INDEPENDENT_AMBULATORY_CARE_PROVIDER_SITE_OTHER): Payer: Medicaid Other | Admitting: Internal Medicine

## 2017-07-22 ENCOUNTER — Encounter: Payer: Self-pay | Admitting: Internal Medicine

## 2017-07-22 VITALS — BP 138/74 | HR 81 | Temp 98.5°F | Ht 61.0 in | Wt 129.0 lb

## 2017-07-22 DIAGNOSIS — N200 Calculus of kidney: Secondary | ICD-10-CM

## 2017-07-22 HISTORY — DX: Calculus of kidney: N20.0

## 2017-07-22 LAB — POCT UA - MICROSCOPIC ONLY

## 2017-07-22 LAB — POCT URINALYSIS DIP (MANUAL ENTRY)
BILIRUBIN UA: NEGATIVE
BILIRUBIN UA: NEGATIVE mg/dL
Glucose, UA: NEGATIVE mg/dL
Nitrite, UA: NEGATIVE
PH UA: 6 (ref 5.0–8.0)
Protein Ur, POC: NEGATIVE mg/dL
Spec Grav, UA: <=1.005 — AB (ref 1.010–1.025)
Urobilinogen, UA: 0.2 E.U./dL

## 2017-07-22 MED ORDER — OXYCODONE HCL 5 MG PO TABS: 5.0000 mg | ORAL_TABLET | ORAL | 0 refills | Status: DC | PRN

## 2017-07-22 NOTE — Assessment & Plan Note (Addendum)
-   Patient with symptoms of renal colic, gross hematuria (also noted on UA), and history of kidney stones, making kidney stones the leading diagnosis. No CVA tenderness or fever to suggest UTI/pyelonephritis.  - Ordered CT stone study. To be performed at [place] for authorization at [phone] and received authorization number of [id number]. Will call [facility] scheduling line at [phone] when it opens tomorrow morning to provide authorization code.  - Will determine next steps of treatment (no obstructing stones vs need for lithotripsy) pending CT scan.  - Recommended continuing ibuprofen and tylenol. Provided Rx for #10 oxycodone IR 5 mg for severe pain.

## 2017-07-22 NOTE — Patient Instructions (Signed)
Ms. Cavenaugh,  I am sorry for the back-and-forth on this testing!  I will call you with appointment time for the CT.  Continue ibuprofen and tylenol. You may find relief with oxy-IR for severe pain.  Best, Dr. Ola Spurr

## 2017-07-22 NOTE — Progress Notes (Signed)
[facility] Progress Note  Subjective:  Diana [name] is a 61 y.o. female who presents with concern for kidney stones. She has a history of stones that she has been able to pass on her own in the past. She has had on-and-off flank pain for the past 2-3 months but noticed spotting when she wiped over the weekend and has had worsening colic pain. Pains can last up to 10-15 minutes and can bring tears to her eyes. She was seen by Urgent Care yesterday, and CT stone study was recommended. Due to patient's insurance (Medicaid), however, study needed to be ordered by a provider at patient's PCP office, which is the reason for her appointment today. She has been taking ibuprofen and tylenol with minimal relief. She has some nausea but no vomiting. Denies fevers.   Says she has had stones analyzed before and was told they were not calcium stones. Has cut out tea and now drinks about 60 oz of water a day instead. This had decreased frequency of nephrolithiasis symptoms until now.  Social: Current smoker  Allergies  Allergen Reactions  . Penicillins Anaphylaxis  . Demerol [Meperidine] Nausea And Vomiting  . Doxycycline Hyclate Nausea And Vomiting  . Gabapentin Other (See Comments)    Malaise  . Sulfa Antibiotics Hives  . Benadryl [Diphenhydramine Hcl] Rash  . Cephalexin Rash  . Morphine And Related Nausea And Vomiting  . Spiriva Handihaler [Tiotropium Bromide Monohydrate] Rash    Objective: Blood pressure 138/74, pulse 81, temperature 98.5 F (36.9 C), temperature source Oral, height 5\' 1"  (1.549 m), weight 129 lb (58.5 kg), SpO2 98 %. Body mass index is 24.37 kg/m. Constitutional: Pleasant female in NAD Abdominal: Soft. +BS, mildly TTP over epigastric region. Musculoskeletal: No CVA tenderness.  Psychiatric: Normal mood and affect.  Vitals reviewed  Recent Results (from the past 2160 hour(s))  POCT urinalysis dipstick     Status: Abnormal   Collection Time: 07/22/17  4:37 PM   Result Value Ref Range   Color, UA yellow yellow   Clarity, UA clear clear   Glucose, UA negative negative mg/dL   Bilirubin, UA negative negative   Ketones, POC UA negative negative mg/dL   Spec Grav, UA <=1.005 (A) 1.010 - 1.025   Blood, UA moderate (A) negative   pH, UA 6.0 5.0 - 8.0   Protein Ur, POC negative negative mg/dL   Urobilinogen, UA 0.2 0.2 or 1.0 E.U./dL   Nitrite, UA Negative Negative   Leukocytes, UA Trace (A) Negative  POCT UA - Microscopic Only     Status: None   Collection Time: 07/22/17  4:37 PM  Result Value Ref Range   WBC, Ur, HPF, POC 1-5    RBC, urine, microscopic 0-3    Bacteria, U Microscopic TRACE    Epithelial cells, urine per micros RARE     Assessment/Plan: Kidney stones - Patient with symptoms of renal colic, gross hematuria (also noted on UA), and history of kidney stones, making kidney stones the leading diagnosis. No CVA tenderness or fever to suggest UTI/pyelonephritis.  - Ordered CT stone study. To be performed at [place] for authorization at [phone] and received authorization number of [id number]. Will call [facility] scheduling line at [phone] when it opens tomorrow morning to provide authorization code.  - Will determine next steps of treatment (no obstructing stones vs need for lithotripsy) pending CT scan.  - Recommended continuing ibuprofen and tylenol. Provided Rx for #10 oxycodone IR 5 mg  for severe pain.  Follow-up based on CT study results.  Olene Floss, MD [place], PGY-3

## 2017-07-23 ENCOUNTER — Telehealth: Payer: Self-pay | Admitting: Internal Medicine

## 2017-07-23 NOTE — Telephone Encounter (Signed)
Left message for patient that [facility] can do 3:30 pm appointment for CT stone study. Asked her to call back if she cannot make this time. Faxed order form to [facility].   Olene Floss, MD [place], PGY-3

## 2017-07-29 ENCOUNTER — Telehealth: Payer: Self-pay | Admitting: Family Medicine

## 2017-07-29 NOTE — Telephone Encounter (Signed)
Called patient to follow up from recent visit to Urgent Care in [place] as well as to our clinic to be assessed for possible kidney stones. Received fax with urine culture, wet prep, and CT abd without contrast results from [place]. Patient had apparently requested that these records were faxed to [place] - she knew she did not have a kidney stone, and she had been told about her UTI. Has picked up the prescribed cipro and feels better. She feels she doesn't need to be seen here right now, will call if anything changes. Records placed in batch scanning.

## 2017-09-07 ENCOUNTER — Encounter: Payer: Self-pay | Admitting: Internal Medicine

## 2017-09-07 ENCOUNTER — Ambulatory Visit (INDEPENDENT_AMBULATORY_CARE_PROVIDER_SITE_OTHER): Payer: Medicaid Other | Admitting: Internal Medicine

## 2017-09-07 VITALS — BP 141/80 | HR 87 | Temp 98.2°F | Ht 61.0 in | Wt 128.2 lb

## 2017-09-07 DIAGNOSIS — J449 Chronic obstructive pulmonary disease, unspecified: Secondary | ICD-10-CM

## 2017-09-07 DIAGNOSIS — R0981 Nasal congestion: Secondary | ICD-10-CM

## 2017-09-07 MED ORDER — LEVOFLOXACIN 500 MG PO TABS: 500.0000 mg | ORAL_TABLET | Freq: Every day | ORAL | 0 refills | Status: DC

## 2017-09-07 MED ORDER — FLUCONAZOLE 150 MG PO TABS: ORAL_TABLET | ORAL | 0 refills | Status: DC

## 2017-09-07 MED ORDER — ALBUTEROL SULFATE HFA 108 (90 BASE) MCG/ACT IN AERS: 2.0000 | INHALATION_SPRAY | Freq: Four times a day (QID) | RESPIRATORY_TRACT | 5 refills | Status: DC | PRN

## 2017-09-07 NOTE — Progress Notes (Signed)
[facility] Progress Note  Subjective:  Diana [name] is a 61 y.o. female with history of HTN, COPD, and migraines who presents for nasal congestion and facial pain x 3 weeks. She has had about 2 sinus infections within the last 2 months. She has been using tea tree oil, OTC nasal spray, and neti pot without much relief. She has had intermittent chills and fever (recalls Temp to 101 F). No known sick contacts but her grandkids go to daycare. Reports facial pain. Has a mostly dry cough but has had occasionally greenish nasal drainage. Also notes itchy eyes. Denies shortness of breath but has noted a wheeze sometimes at night, which she attributes to phlegm building up in her chest. Has not been using albuterol inhaler; takes stiolto daily. ROS: No rashes, no n/v/d, no increased dyspnea  Social: Current smoker, not ready to quit.   Allergies  Allergen Reactions  . Penicillins Anaphylaxis  . Demerol [Meperidine] Nausea And Vomiting  . Doxycycline Hyclate Nausea And Vomiting  . Gabapentin Other (See Comments)    Malaise  . Sulfa Antibiotics Hives  . Benadryl [Diphenhydramine Hcl] Rash  . Cephalexin Rash  . Morphine And Related Nausea And Vomiting  . Spiriva Handihaler [Tiotropium Bromide Monohydrate] Rash    Objective: Blood pressure (!) 141/80, pulse 87, temperature 98.2 F (36.8 C), temperature source Oral, height 5\' 1"  (1.549 m), weight 128 lb 3.2 oz (58.2 kg), SpO2 97 %. Constitutional: Tired appearing female in NAD HENT: Swelling and erythema of nasal turbinates. Fluid behind bilateral TMs but no erythema. Mild erythema of posterior oropharynx. Mild TTP but no lymphadenopathy of submandibular region. Obvious discomfort with percussion of maxillary sinuses but not over frontal.  Cardiovascular: RRR, S1, S2, no m/r/g.  Pulmonary/Chest: Effort normal and breath sounds normal. No respiratory distress.  Neurological: AOx3, no focal deficits. Skin: No rash noted on exposed  skin.  Psychiatric: Normal mood and affect.  Vitals reviewed  Assessment/Plan: Nasal congestion - Suspect sinusitis given duration of symptoms > 10 days, report of nasal drainage, sinus tenderness, and intermittent fevers. COPD exacerbation less likely with more URI symptoms instead of increased sputum production. However, did refill albuterol inhaler and instructed patient to use if chest felt tight or she felt like she was wheezing (none appreciated on today's exam). - Given patient's many antibiotic allergies, ordered levaquin x 7 days - Recommended supportive treatment of adding nasal saline and increasing fluid intake to help loosen secretions - Offered steroid taper to cover for developing COPD exacerbation, but patient says earlier this year steroids caused her blood pressure to be elevated and would prefer not to start course at this time.  - Ibuprofen/tylenol prn for discomfort/fever - Consider ENT referral for multiple episodes of sinusitis; patient declines at this time but may consider in the future - Encouraged smoking cessation  Ordered diflucan in case patient develops yeast infection, as patient says she is very prone to them.  Follow-up prn.  Olene Floss, MD [place], PGY-3

## 2017-09-07 NOTE — Patient Instructions (Signed)
Diana [name],  I think you have a sinusitis. Please take levaquin 500 mg (1 tablet) for the next 7 days. If symptoms are not improved in a couple days since starting the antibiotic, you may need a course of steroids. Please let me know.  Use an albuterol inhaler as needed for wheezing. Drinking plenty of fluids can help loosen mucus and make it easier to cough up. Continue nasal spray/nasal saline.  Best, Dr. Ola Spurr

## 2017-09-09 ENCOUNTER — Encounter: Payer: Self-pay | Admitting: Internal Medicine

## 2017-09-09 DIAGNOSIS — R0981 Nasal congestion: Secondary | ICD-10-CM | POA: Insufficient documentation

## 2017-09-09 NOTE — Assessment & Plan Note (Addendum)
-   Suspect sinusitis given duration of symptoms > 10 days, report of nasal drainage, sinus tenderness, and intermittent fevers. COPD exacerbation less likely with more URI symptoms instead of increased sputum production. However, did refill albuterol inhaler and instructed patient to use if chest felt tight or she felt like she was wheezing (none appreciated on today's exam). - Given patient's many antibiotic allergies, ordered levaquin x 7 days - Recommended supportive treatment of adding nasal saline and increasing fluid intake to help loosen secretions - Offered steroid taper to cover for developing COPD exacerbation, but patient says earlier this year steroids caused her blood pressure to be elevated and would prefer not to start course at this time.  - Ibuprofen/tylenol prn for discomfort/fever - Consider ENT referral for multiple episodes of sinusitis; patient declines at this time but may consider in the future - Encouraged smoking cessation

## 2017-10-12 ENCOUNTER — Encounter: Payer: Self-pay | Admitting: Student in an Organized Health Care Education/Training Program

## 2017-10-12 ENCOUNTER — Ambulatory Visit (HOSPITAL_COMMUNITY)
Admission: RE | Admit: 2017-10-12 | Discharge: 2017-10-12 | Disposition: A | Discharge status: Home / Self Care | Payer: Medicaid Other | Source: Ambulatory Visit | Attending: Family Medicine | Admitting: Family Medicine

## 2017-10-12 ENCOUNTER — Ambulatory Visit: Payer: Medicaid Other | Admitting: Student in an Organized Health Care Education/Training Program

## 2017-10-12 VITALS — BP 137/80 | HR 101 | Temp 98.2°F | Wt 134.0 lb

## 2017-10-12 DIAGNOSIS — M47812 Spondylosis without myelopathy or radiculopathy, cervical region: Secondary | ICD-10-CM | POA: Insufficient documentation

## 2017-10-12 DIAGNOSIS — M542 Cervicalgia: Secondary | ICD-10-CM | POA: Insufficient documentation

## 2017-10-12 DIAGNOSIS — Z23 Encounter for immunization: Secondary | ICD-10-CM

## 2017-10-12 MED ORDER — CYCLOBENZAPRINE HCL 5 MG PO TABS: 5.0000 mg | ORAL_TABLET | Freq: Three times a day (TID) | ORAL | 0 refills | Status: DC | PRN

## 2017-10-12 NOTE — Progress Notes (Signed)
CC: Shoulder/neck pain  HPI: Diana [name] is a 61 y.o. female with left shoulder blade pain radiating to neck.  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 But 2 weeks ago she developed severe pain in her shoulder blade radiating up to her neck. She denies any fevers or weight loss. She has been going to a chiropractor, however he stated that she needed to come in and see a doctor and potentially get imaging for her neck. In order to continue seeing the chiropractor she will need a referral.  Review of Symptoms:  See HPI for ROS.   CC, SH/smoking status, and VS noted.  Objective: BP 137/80 (BP Location: Left Arm, Patient Position: Sitting, Cuff Size: Normal)   Pulse (!) 101   Temp 98.2 F (36.8 C) (Oral)   Wt 134 lb (60.8 kg)   SpO2 97%   BMI 25.32 kg/m  GEN: NAD, alert, cooperative, and pleasant. Back Exam:  Inspection: Unremarkable  Palpable tenderness: +cervical tenderness to palpation midline spine Range of Motion:  Flexion 45 deg; Extension 45 deg; Side Bending to 45 deg bilaterally; Rotation to 45 deg bilaterally. +some pain with rotation of the neck right and left  Leg strength: Quad: 5/5 Hamstring: 5/5 Hip flexor: 5/5 Hip abductors: 5/5  Strength at foot: Plantar-flexion: 5/5 Dorsi-flexion: 5/5 Eversion: 5/5 Inversion: 5/5  Sensory change: Gross sensation intact to all lumbar and sacral dermatomes.  Reflexes: 2+ at both patellar tendons, 2+ at achilles tendons, Babinski's downgoing.  Gait unremarkable. FABER: negative.  RESPIRATORY: clear to auscultation bilaterally with no wheezes, rhonchi or rales, good effort CV: RRR, no m/r/g, no peripheral edema GI: soft, non-tender, non-distended, no hepatosplenomegaly SKIN: warm and dry, no rashes or  lesions NEURO: II-XII grossly intact, normal gait, peripheral sensation intact PSYCH: AAOx3, appropriate affect  Assessment and plan:  Cervical spine pain May be a muscle spasm inducing pain. Pain does not seem to be radicular in nature so less concern for nerve compression and we can hold off on MRI. She has no history of trauma, however she dose have cervical spine tenderness to palpation so would be reasonable to get XR to rule out tumor or fracture. - XR cervical spine showed mild degenerative changes, no fracture or mass - flexeril as needed for muscle spasm - muscle spasm can take several days to resolve, discussed w patient - return precautions discussed - Reasonable to refer to chiropractor in the future if she requests.   Orders Placed This Encounter  Procedures  . DG Cervical Spine Complete    Standing Status:   Future    Number of Occurrences:   1    Standing Expiration Date:   12/12/2018    Order Specific Question:   Reason for Exam (SYMPTOM  OR DIAGNOSIS REQUIRED)    Answer:   spinal process tenderness    Order Specific Question:   Is patient pregnant?    Answer:   No    Order Specific Question:   Preferred imaging location?    Answer:   [facility]    Order Specific Question:   Radiology Contrast Protocol - do NOT remove file path    Answer:   [email or web address]  . Flu Vaccine QUAD 36+ mos IM  Meds ordered this encounter  Medications  . cyclobenzaprine (FLEXERIL) 5 MG tablet    Sig: Take 1 tablet (5 mg total) 3 (three) times daily as needed by mouth for muscle spasms.    Dispense:  30 tablet    Refill:  0     Everrett Coombe, MD,MS,  PGY2 10/15/2017 11:03 PM

## 2017-10-12 NOTE — Patient Instructions (Signed)
It was a pleasure seeing you today in our clinic. Today we discussed your neck and shoulder pain. Here is the treatment plan we have discussed and agreed upon together:  If you develop fevers or weakness in the left arm please come in to be seen right away.  We ordered an Xray of your spine at today's visit. I will call you with these results.  Our clinic's number is [phone]. Please call with questions or concerns about what we discussed today.  Be well, Dr. Burr Medico

## 2017-10-15 ENCOUNTER — Encounter: Payer: Self-pay | Admitting: Student in an Organized Health Care Education/Training Program

## 2017-10-15 NOTE — Assessment & Plan Note (Addendum)
May be a muscle spasm inducing pain. Pain does not seem to be radicular in nature so less concern for nerve compression and we can hold off on MRI. She has no history of trauma, however she dose have cervical spine tenderness to palpation so would be reasonable to get XR to rule out tumor or fracture. - XR cervical spine showed mild degenerative changes, no fracture or mass - flexeril as needed for muscle spasm - muscle spasm can take several days to resolve, discussed w patient - return precautions discussed - Reasonable to refer to chiropractor in the future if she requests.

## 2017-10-20 DIAGNOSIS — H05811 Cyst of right orbit: Secondary | ICD-10-CM | POA: Diagnosis not present

## 2017-10-21 ENCOUNTER — Telehealth: Payer: Self-pay | Admitting: *Deleted

## 2017-10-21 DIAGNOSIS — M542 Cervicalgia: Secondary | ICD-10-CM

## 2017-10-21 NOTE — Telephone Encounter (Signed)
Referral placed for Dr Burr Medico as she is currently out of office.

## 2017-10-21 NOTE — Telephone Encounter (Signed)
Informed pt that referral has been placed. Katharina Caper, Darian Ace D, [place]

## 2017-10-21 NOTE — Telephone Encounter (Signed)
-----   Message from Everrett Coombe, MD sent at 10/15/2017 11:05 PM EST ----- Please let patient know that her xray of her spine did not show any fracture or mass. There is no nerve compression to make [place] think she needs an MRI at this point. She should continue the muscle relaxant and return to care if her symptoms worsen or fail to improve.

## 2017-10-21 NOTE — Telephone Encounter (Signed)
Contacted pt and gave her the below information and she said that doctor had said that if there were no fractures then a referral would be placed for a chiropractor. She said that it needs to go to [facility] in [place], [place]. Routing to doctor that sent initial message. As well as PCP. Diana [name], Diana [name], [place]

## 2017-10-21 NOTE — Addendum Note (Signed)
Addended by: Bufford Lope on: 10/21/2017 10:05 AM   Modules accepted: Orders

## 2017-11-04 DIAGNOSIS — J189 Pneumonia, unspecified organism: Secondary | ICD-10-CM | POA: Diagnosis not present

## 2017-11-04 DIAGNOSIS — R0602 Shortness of breath: Secondary | ICD-10-CM | POA: Diagnosis not present

## 2017-11-16 DIAGNOSIS — S86812A Strain of other muscle(s) and tendon(s) at lower leg level, left leg, initial encounter: Secondary | ICD-10-CM | POA: Diagnosis not present

## 2017-12-07 ENCOUNTER — Ambulatory Visit: Payer: Medicaid Other | Admitting: Internal Medicine

## 2017-12-07 ENCOUNTER — Telehealth: Payer: Self-pay | Admitting: *Deleted

## 2017-12-07 VITALS — BP 120/75 | HR 96 | Temp 98.2°F | Wt 130.8 lb

## 2017-12-07 DIAGNOSIS — Z720 Tobacco use: Secondary | ICD-10-CM | POA: Diagnosis not present

## 2017-12-07 DIAGNOSIS — S3991XD Unspecified injury of abdomen, subsequent encounter: Secondary | ICD-10-CM

## 2017-12-07 DIAGNOSIS — E785 Hyperlipidemia, unspecified: Secondary | ICD-10-CM | POA: Diagnosis present

## 2017-12-07 DIAGNOSIS — Z01818 Encounter for other preprocedural examination: Secondary | ICD-10-CM

## 2017-12-07 DIAGNOSIS — J449 Chronic obstructive pulmonary disease, unspecified: Secondary | ICD-10-CM | POA: Diagnosis not present

## 2017-12-07 MED ORDER — AMLODIPINE BESYLATE 5 MG PO TABS: 5.0000 mg | ORAL_TABLET | Freq: Every day | ORAL | 3 refills | Status: DC

## 2017-12-07 MED ORDER — TIOTROPIUM BROMIDE-OLODATEROL 2.5-2.5 MCG/ACT IN AERS: 2.0000 | INHALATION_SPRAY | Freq: Every day | RESPIRATORY_TRACT | 3 refills | Status: DC

## 2017-12-07 MED ORDER — PROPRANOLOL HCL 40 MG PO TABS: 40.0000 mg | ORAL_TABLET | Freq: Three times a day (TID) | ORAL | 3 refills | Status: DC

## 2017-12-07 MED ORDER — ALBUTEROL SULFATE HFA 108 (90 BASE) MCG/ACT IN AERS: 2.0000 | INHALATION_SPRAY | Freq: Four times a day (QID) | RESPIRATORY_TRACT | 5 refills | Status: DC | PRN

## 2017-12-07 MED ORDER — HYDROCODONE-HOMATROPINE 5-1.5 MG/5ML PO SYRP: 5.0000 mL | ORAL_SOLUTION | Freq: Four times a day (QID) | ORAL | 0 refills | Status: DC | PRN

## 2017-12-07 MED ORDER — ESOMEPRAZOLE MAGNESIUM 40 MG PO CPDR: 40.0000 mg | DELAYED_RELEASE_CAPSULE | Freq: Every day | ORAL | 1 refills | Status: DC

## 2017-12-07 MED ORDER — VARENICLINE TARTRATE 0.5 MG X 11 & 1 MG X 42 PO MISC: ORAL | 0 refills | Status: DC

## 2017-12-07 NOTE — Patient Instructions (Signed)
Diana [name],  I will fax in clearance for eye surgery. Your lungs sound clear today. Please drink plenty of fluids to keep secretions thin. Take hycodan cough syrup at needed. I would recommend taking the night before surgery and morning of. I will make note that I am prescribing this when I fax the papers to your ophthalmologist.  For smoking cessation, start chantix 1 week prior to target quit date. The schedule is as follows, for day 1-3 take 0.5 mg daily; for day 4-7 take 0.5 mg twice daily; then increase to 1 mg twice daily for 11 weeks. Please call when you need a refill. I would recommend following up in about 1 week to see how things are going.  I will call you with you lipid panel results to discuss need for cholesterol medications.  I refilled inhalers and blood pressure medication.  Best, Dr. Ola Spurr

## 2017-12-07 NOTE — Telephone Encounter (Signed)
Pharmacist calls to states that albuterol and propanolol are not normally rx'd together.  Would like a call back to make sure they should fill the meds. Fleeger, [name], CMA

## 2017-12-08 ENCOUNTER — Encounter: Payer: Self-pay | Admitting: Internal Medicine

## 2017-12-08 DIAGNOSIS — Z720 Tobacco use: Secondary | ICD-10-CM | POA: Insufficient documentation

## 2017-12-08 DIAGNOSIS — Z01818 Encounter for other preprocedural examination: Secondary | ICD-10-CM | POA: Insufficient documentation

## 2017-12-08 LAB — LIPID PANEL
CHOLESTEROL TOTAL: 276 mg/dL — AB (ref 100–199)
Chol/HDL Ratio: 4.9 ratio — ABNORMAL HIGH (ref 0.0–4.4)
HDL: 56 mg/dL (ref >39)
LDL Calculated: 193 mg/dL — ABNORMAL HIGH (ref 0–99)
Triglycerides: 135 mg/dL (ref 0–149)
VLDL Cholesterol Cal: 27 mg/dL (ref 5–40)

## 2017-12-08 NOTE — Assessment & Plan Note (Addendum)
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 Lungs CTAB on my exam today. Observed patient laying flat on exam table. Able to be conversant and did not cough. Has tried tessalon perles and mucinex recently without improvement in cough. Mostly an issue at night. Do not think patient's lung exam is going to improve in the next several months. Though she is considering smoking cessation, this may even temporarily worsen cough. No focal lung findings or fever to suggest pneumonia. No increased sputum production or shortness of breath to suggest COPD exacerbation. Already on a maintenance inhaler that contains steroid. - Precepted with Dr. Walker Kehr - Faxed form to [facility], saying patient's respiratory status is likely best it will be and would suggest proceeding with surgery, as cyst continues to grow and is increasingly bothersome to patient. - Prescribed hycodan for cough suppression prior to surgery to decrease risk of coughing affecting procedure.  - Refilled stiolto and albuterol
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
## 2017-12-08 NOTE — Assessment & Plan Note (Signed)
-   Patient interested in quitting. Would like to start chantix, as this worked for her in the past. Denies need for further counseling/support. - Prescribed first month of chantix. Asked her to return or call for further refills.

## 2017-12-08 NOTE — Assessment & Plan Note (Signed)
-   Repeat lipid panel today. Will discuss need for statin therapy pending these results.

## 2017-12-10 ENCOUNTER — Other Ambulatory Visit: Payer: Self-pay | Admitting: Internal Medicine

## 2017-12-10 MED ORDER — FLUVASTATIN SODIUM 20 MG PO CAPS: 20.0000 mg | ORAL_CAPSULE | Freq: Every day | ORAL | 1 refills | Status: DC

## 2017-12-10 NOTE — Telephone Encounter (Signed)
Called patient with results of lipid panel. LDL still elevated at 193. 10-yr ASCVD risk of 9.7%. Had cramping on low dose of pravastatin. Will order fluvastatin 20 mg for patient to try with instructions to stop if has bad cramping.  Olene Floss, MD [place], PGY-3

## 2017-12-11 ENCOUNTER — Telehealth: Payer: Self-pay | Admitting: *Deleted

## 2017-12-11 NOTE — Telephone Encounter (Signed)
Received fax from [place] requesting prior authorization of fluvastatin .  Form placed in MD's box for completion along with Medicaid formulary.  Kaisen Ackers [place]etts, [name], CMA

## 2017-12-14 NOTE — Telephone Encounter (Signed)
Called pharmacy to give permission to fill these together. Tech reports patient never picked these up.

## 2017-12-15 NOTE — Telephone Encounter (Signed)
Form completed. Returned to [facility] clinic.

## 2017-12-16 NOTE — Telephone Encounter (Signed)
Prior approval for Fluvastatin completed via [employer].  Prior approval # [id number].  [place] informed.  Fleeger, [name], CMA

## 2018-01-05 DIAGNOSIS — M1612 Unilateral primary osteoarthritis, left hip: Secondary | ICD-10-CM | POA: Diagnosis not present

## 2018-01-21 ENCOUNTER — Encounter: Payer: Self-pay | Admitting: Family Medicine

## 2018-01-27 DIAGNOSIS — H25812 Combined forms of age-related cataract, left eye: Secondary | ICD-10-CM | POA: Diagnosis not present

## 2018-01-27 DIAGNOSIS — H2512 Age-related nuclear cataract, left eye: Secondary | ICD-10-CM | POA: Diagnosis not present

## 2018-02-24 DIAGNOSIS — H25811 Combined forms of age-related cataract, right eye: Secondary | ICD-10-CM | POA: Diagnosis not present

## 2018-02-24 DIAGNOSIS — H2511 Age-related nuclear cataract, right eye: Secondary | ICD-10-CM | POA: Diagnosis not present

## 2018-03-01 ENCOUNTER — Telehealth: Payer: Self-pay | Admitting: Family Medicine

## 2018-03-01 NOTE — Telephone Encounter (Signed)
pts mother has been diagnosed with the flu and she lives with pt.    It was recommended pt contact her PCP since she has COPD.  Maybe Dr Lindell Noe could call in Tamiflu as a preventative.  Please advise

## 2018-05-14 ENCOUNTER — Encounter: Payer: Self-pay | Admitting: Internal Medicine

## 2018-05-14 ENCOUNTER — Other Ambulatory Visit: Payer: Self-pay

## 2018-05-14 ENCOUNTER — Telehealth: Payer: Self-pay

## 2018-05-14 ENCOUNTER — Ambulatory Visit: Payer: Medicaid Other | Admitting: Internal Medicine

## 2018-05-14 VITALS — BP 132/76 | HR 81 | Temp 98.1°F | Ht 61.0 in | Wt 128.6 lb

## 2018-05-14 DIAGNOSIS — J449 Chronic obstructive pulmonary disease, unspecified: Secondary | ICD-10-CM

## 2018-05-14 DIAGNOSIS — Z760 Encounter for issue of repeat prescription: Secondary | ICD-10-CM

## 2018-05-14 DIAGNOSIS — J441 Chronic obstructive pulmonary disease with (acute) exacerbation: Secondary | ICD-10-CM | POA: Diagnosis present

## 2018-05-14 MED ORDER — TIOTROPIUM BROMIDE-OLODATEROL 2.5-2.5 MCG/ACT IN AERS: 2.0000 | INHALATION_SPRAY | Freq: Every day | RESPIRATORY_TRACT | 3 refills | Status: DC

## 2018-05-14 MED ORDER — ESOMEPRAZOLE MAGNESIUM 40 MG PO CPDR: 40.0000 mg | DELAYED_RELEASE_CAPSULE | Freq: Every day | ORAL | 1 refills | Status: DC

## 2018-05-14 MED ORDER — PROPRANOLOL HCL 40 MG PO TABS: 40.0000 mg | ORAL_TABLET | Freq: Three times a day (TID) | ORAL | 3 refills | Status: DC

## 2018-05-14 MED ORDER — ALBUTEROL SULFATE (2.5 MG/3ML) 0.083% IN NEBU
2.5000 mg | INHALATION_SOLUTION | Freq: Once | RESPIRATORY_TRACT | Status: AC
  Administered 2018-05-14: 2.5 mg via RESPIRATORY_TRACT

## 2018-05-14 MED ORDER — PREDNISONE 50 MG PO TABS: ORAL_TABLET | ORAL | 0 refills | Status: DC

## 2018-05-14 MED ORDER — ALBUTEROL SULFATE HFA 108 (90 BASE) MCG/ACT IN AERS: 2.0000 | INHALATION_SPRAY | Freq: Four times a day (QID) | RESPIRATORY_TRACT | 5 refills | Status: DC | PRN

## 2018-05-14 MED ORDER — IPRATROPIUM BROMIDE 0.02 % IN SOLN
0.5000 mg | Freq: Once | RESPIRATORY_TRACT | Status: AC
  Administered 2018-05-14: 0.5 mg via RESPIRATORY_TRACT

## 2018-05-14 MED ORDER — PROPRANOLOL HCL 40 MG PO TABS
40.0000 mg | ORAL_TABLET | Freq: Three times a day (TID) | ORAL | 3 refills | Status: DC
Start: 2018-05-14 — End: 2019-08-09

## 2018-05-14 MED ORDER — AMLODIPINE BESYLATE 5 MG PO TABS: 5.0000 mg | ORAL_TABLET | Freq: Every day | ORAL | 3 refills | Status: DC

## 2018-05-14 MED ORDER — FLUVASTATIN SODIUM 20 MG PO CAPS: 20.0000 mg | ORAL_CAPSULE | Freq: Every day | ORAL | 1 refills | Status: DC

## 2018-05-14 MED ORDER — OMEPRAZOLE 20 MG PO CPDR: 20.0000 mg | DELAYED_RELEASE_CAPSULE | Freq: Every day | ORAL | 3 refills | Status: DC

## 2018-05-14 MED ORDER — AZITHROMYCIN 250 MG PO TABS: ORAL_TABLET | ORAL | 0 refills | Status: DC

## 2018-05-14 NOTE — Telephone Encounter (Signed)
Called number for [place] and unable to leave message. Patient to have prescription filled at different pharmacy.

## 2018-05-14 NOTE — Patient Instructions (Addendum)
Diana [name],  Please take azithromycin and prednisone for the next 5 days. Return next week if no improvement. If you have worsening shortness of breath over the weekend, please seek care at urgent care of the emergency department.  Please follow-up when convenient for a wellness visit.  Best, Dr. Ola Spurr

## 2018-05-14 NOTE — Telephone Encounter (Signed)
Pharmacist at [facility] called regarding Fluvastatin. Not currently available in 20 mg capsules. The 40 mg is also a capsule so unable to split.  Needs to know if you want an alternative or to change dose.  Call back is [phone]  Danley Danker, RN [facility])

## 2018-05-16 DIAGNOSIS — Z760 Encounter for issue of repeat prescription: Secondary | ICD-10-CM | POA: Insufficient documentation

## 2018-05-16 NOTE — Assessment & Plan Note (Signed)
-   Provided refills of stiolto, albuterol, omeprazole, statin, amlodipine, propranolol

## 2018-05-16 NOTE — Progress Notes (Signed)
[facility] Progress Note  Subjective:  Diana [name] is a 62 y.o. female with history of HTN, COPD, tobacco abuse, and migraines who presents for ED visit follow-up and ongoing cough. She was first seen at Urgent Care on the 4th of this month and was thought to have "sinusitis, bronchitis, a kidney infection and yeast infection" according to patient and was given levaquin. She subsequently had a lot of swelling around her nose and increased congestion and was in seen at an outside ED Sunday. She reports being told she had rhinitis an bronchitis and was given a steroid shot, told to use afrin, and given an albuterol treatment. She has also been taking claritin D. She presents today for ongoing nasal congestion with headaches (though somewhat improved) but with dyspnea and wheezing and continued sputum production. She also reports fever to 101.2 F yesterday and chills. She says she had a negative chest xray at the ED visit over the weekend. She also requests medication refills. ROS: No hemoptysis, no chest pain, no diarrhea  Allergies  Allergen Reactions  . Penicillins Anaphylaxis  . Demerol [Meperidine] Nausea And Vomiting  . Doxycycline Hyclate Nausea And Vomiting  . Gabapentin Other (See Comments)    Malaise  . Sulfa Antibiotics Hives  . Levaquin [Levofloxacin]     Reported some possible facial swelling with administration 05/2018  . Benadryl [Diphenhydramine Hcl] Rash  . Cephalexin Rash  . Morphine And Related Nausea And Vomiting  . Spiriva Handihaler [Tiotropium Bromide Monohydrate] Rash    Social History   Tobacco Use  . Smoking status: Current Every Day Smoker    Packs/day: 1.00    Years: 32.00    Pack years: 32.00    Types: Cigarettes  . Smokeless tobacco: Never Used  Substance Use Topics  . Alcohol use: Yes    Alcohol/week: 0.0 oz    Comment: 08/17/2013 "couple mixed drinks/wk"    Objective: Blood pressure 132/76, pulse 81, temperature 98.1 F (36.7 C),  temperature source Oral, height 5\' 1"  (1.549 m), weight 128 lb 9.6 oz (58.3 kg), SpO2 99 %. Body mass index is 24.3 kg/m. Constitutional: Pleasant female in NAD HENT: Nasal congestion present, no maxillary or frontal sinus TTP Cardiovascular: RRR, S1, S2, no m/r/g.  Pulmonary/Chest: Diffuse inspiratory and expiratory wheezing throughout posterior lung fields. No increased work of breathing.  Vitals reviewed  Assessment/Plan: Chronic obstructive pulmonary disease ([place]) - With exacerbation and possible concomitant bacterial sinusitis. Given extensive drug allergies, will treat with azithromycin x 5 days and prednisone taper. Had reported recent negative chest x-ray and no focal findings on lung exam, so low suspicion for pneumonia. Gave duoneb treatment in clinic per patient preference.  - Gave return precautions of worsening shortness of breath.   Medication refill - Provided refills of stiolto, albuterol, omeprazole, statin, amlodipine, propranolol  Follow-up at earliest convenience for routine wellness exam.   Olene Floss, MD [place], PGY-3

## 2018-05-16 NOTE — Assessment & Plan Note (Addendum)
-   With exacerbation and possible concomitant bacterial sinusitis. Given extensive drug allergies, will treat with azithromycin x 5 days and prednisone taper. Had reported recent negative chest x-ray and no focal findings on lung exam, so low suspicion for pneumonia. Gave duoneb treatment in clinic per patient preference.  - Gave return precautions of worsening shortness of breath.

## 2018-05-17 ENCOUNTER — Encounter: Payer: Self-pay | Admitting: Internal Medicine

## 2018-05-19 ENCOUNTER — Telehealth: Payer: Self-pay

## 2018-05-19 ENCOUNTER — Telehealth: Payer: Self-pay | Admitting: Internal Medicine

## 2018-05-19 MED ORDER — PITAVASTATIN CALCIUM 1 MG PO TABS: 1.0000 mg | ORAL_TABLET | Freq: Every day | ORAL | 1 refills | Status: DC

## 2018-05-19 NOTE — Telephone Encounter (Signed)
Received message from pharmacy that fluvastatin is on backorder. Discussed trying pitavastatin with patient as most similar to fluvastatin. She has had muscle cramping on pravastatin in the past. Patient agreeable to this.  Olene Floss, MD [place], PGY-3

## 2018-05-19 NOTE — Telephone Encounter (Signed)
Received fax from [place] requesting prior authorization of Livalo 1 mg. Form placed in MD's box for completion along with Medicaid formulary.  Danley Danker, RN [facility] RN)

## 2018-05-21 NOTE — Telephone Encounter (Signed)
Placed PA in RN box.

## 2018-05-24 ENCOUNTER — Other Ambulatory Visit: Payer: Self-pay

## 2018-05-24 ENCOUNTER — Emergency Department (HOSPITAL_COMMUNITY)
Admission: EM | Admit: 2018-05-24 | Discharge: 2018-05-24 | Disposition: A | Discharge status: Home / Self Care | Payer: Medicaid Other | Attending: Emergency Medicine | Admitting: Emergency Medicine

## 2018-05-24 ENCOUNTER — Encounter (HOSPITAL_COMMUNITY): Payer: Self-pay | Admitting: Emergency Medicine

## 2018-05-24 ENCOUNTER — Emergency Department (HOSPITAL_COMMUNITY): Payer: Medicaid Other

## 2018-05-24 DIAGNOSIS — I129 Hypertensive chronic kidney disease with stage 1 through stage 4 chronic kidney disease, or unspecified chronic kidney disease: Secondary | ICD-10-CM | POA: Insufficient documentation

## 2018-05-24 DIAGNOSIS — Z79899 Other long term (current) drug therapy: Secondary | ICD-10-CM | POA: Insufficient documentation

## 2018-05-24 DIAGNOSIS — N182 Chronic kidney disease, stage 2 (mild): Secondary | ICD-10-CM | POA: Insufficient documentation

## 2018-05-24 DIAGNOSIS — F17210 Nicotine dependence, cigarettes, uncomplicated: Secondary | ICD-10-CM | POA: Insufficient documentation

## 2018-05-24 DIAGNOSIS — J45909 Unspecified asthma, uncomplicated: Secondary | ICD-10-CM | POA: Insufficient documentation

## 2018-05-24 DIAGNOSIS — R42 Dizziness and giddiness: Secondary | ICD-10-CM

## 2018-05-24 DIAGNOSIS — R55 Syncope and collapse: Secondary | ICD-10-CM | POA: Diagnosis present

## 2018-05-24 DIAGNOSIS — R0600 Dyspnea, unspecified: Secondary | ICD-10-CM | POA: Insufficient documentation

## 2018-05-24 LAB — BASIC METABOLIC PANEL
Anion gap: 15 (ref 5–15)
BUN: 10 mg/dL (ref 6–20)
CO2: 20 mmol/L — ABNORMAL LOW (ref 22–32)
Calcium: 9.1 mg/dL (ref 8.9–10.3)
Chloride: 100 mmol/L — ABNORMAL LOW (ref 101–111)
Creatinine, Ser: 0.99 mg/dL (ref 0.44–1.00)
GFR calc Af Amer: >60 mL/min (ref >60)
GFR calc non Af Amer: >60 mL/min (ref >60)
Glucose, Bld: 67 mg/dL (ref 65–99)
Potassium: 4.6 mmol/L (ref 3.5–5.1)
Sodium: 135 mmol/L (ref 135–145)

## 2018-05-24 LAB — CBC
HCT: 46.8 % — ABNORMAL HIGH (ref 36.0–46.0)
Hemoglobin: 15.1 g/dL — ABNORMAL HIGH (ref 12.0–15.0)
MCH: 29.5 pg (ref 26.0–34.0)
MCHC: 32.3 g/dL (ref 30.0–36.0)
MCV: 91.6 fL (ref 78.0–100.0)
Platelets: 247 10*3/uL (ref 150–400)
RBC: 5.11 MIL/uL (ref 3.87–5.11)
RDW: 11.6 % (ref 11.5–15.5)
WBC: 13.3 10*3/uL — ABNORMAL HIGH (ref 4.0–10.5)

## 2018-05-24 LAB — URINALYSIS, ROUTINE W REFLEX MICROSCOPIC
Bilirubin Urine: NEGATIVE
Glucose, UA: NEGATIVE mg/dL
Hgb urine dipstick: NEGATIVE
Ketones, ur: NEGATIVE mg/dL
Leukocytes, UA: NEGATIVE
Nitrite: NEGATIVE
Protein, ur: NEGATIVE mg/dL
Specific Gravity, Urine: 1.004 — ABNORMAL LOW (ref 1.005–1.030)
pH: 7 (ref 5.0–8.0)

## 2018-05-24 LAB — I-STAT TROPONIN, ED: Troponin i, poc: 0.01 ng/mL (ref 0.00–0.08)

## 2018-05-24 MED ORDER — SODIUM CHLORIDE 0.9 % IV BOLUS
500.0000 mL | Freq: Once | INTRAVENOUS | Status: AC
  Administered 2018-05-24: 500 mL via INTRAVENOUS

## 2018-05-24 MED ORDER — IPRATROPIUM-ALBUTEROL 0.5-2.5 (3) MG/3ML IN SOLN
3.0000 mL | Freq: Once | RESPIRATORY_TRACT | Status: AC
  Administered 2018-05-24: 3 mL via RESPIRATORY_TRACT
  Filled 2018-05-24: qty 3

## 2018-05-24 NOTE — ED Provider Notes (Signed)
[place] EMERGENCY DEPARTMENT Provider Note   CSN: [id number] Arrival date & time: 05/24/18  1251     History   Chief Complaint Chief Complaint  Patient presents with  . Near Syncope  . Shortness of Breath    HPI Diana [name] is a 62 y.o. female with a hx of tobacco abuse, COPD, hypercholesterolemia, anxiety, asthma, chronic lower back pain, GERD, HTN, IBS, RA, and migraines who presents to the emergency department for lightheadedness and dyspnea which occurred somewhat separately earlier today and are resolved at present. Patient states that yesterday was a fairly normal day for her, she states this AM she woke up feeling okay however when she transitioned from sitting to standing to get out of bed she became lightheaded and had a "funny feeling in her head" and felt as though she may pass out. She states this improved with sitting back down and had reoccurred a few times with position changes. She has not had any syncopal episodes. She also mentions that when she was walking around today she got short of breath. She states that this lasted for maybe a few hours and has since resolved, she is not dyspneic at present. She did not have any chest pain at any point. She states the dyspnea has not seemed to correlate with near syncope sxs. She recently completed steroids yesterday for bronchitis/COPD exacerbation. She is still coughing, non productive, but has been improving.. Denies fever, chills, leg pain/swelling, hemoptysis, recent surgery/trauma, recent long travel, hormone use, personal hx of cancer, or hx of DVT/PE. She has been eating fairly normally, she has not had any recent OTC or prescription medication changes.    HPI  Past Medical History:  Diagnosis Date  . Anxiety   . Asthma   . Barrett esophagus    "don't know that I've ever been stretched" (08/17/2013)  . Choledocholithiasis with acute cholecystitis   . Chronic lower back pain   . Craniosynostosis    congenital  . DDD (degenerative disc disease)   . Family history of anesthesia complication    "PONV; both parents" (08/17/2013)  . GERD (gastroesophageal reflux disease)   . H/O hiatal hernia   . History of blood transfusion 1959-1960  . Hypercholesterolemia 05/28/2012   Has taken Crestor and Lipitor in the past. Switched to lovastatin b/c its $4.    . Hypertension   . IBS (irritable bowel syndrome)   . Migraines    "since age 58; probably twice/month" (08/17/2013)  . PONV (postoperative nausea and vomiting)   . Rheumatoid arthritis ([place])    "in my knuckles" (08/17/2013)  . Ruptured lumbar disc 1990's   L4-L5  . Shingles     Patient Active Problem List   Diagnosis Date Noted  . Medication refill 05/16/2018  . Pre-op exam 12/08/2017  . Tobacco abuse 12/08/2017  . Nasal congestion 09/09/2017  . Kidney stones 07/22/2017  . Hypokalemia 03/13/2017  . Skin lesion of hand 03/13/2017  . Fever 03/13/2017  . Right leg pain 03/04/2017  . Chronic obstructive pulmonary disease ([place]) 03/04/2017  . H/O: hysterectomy 09/22/2014  . Postmenopausal atrophic vaginitis 09/22/2014  . History of right hip replacement 07/24/2014  . Postmenopausal bone loss 05/29/2014  . Migraine headache with aura 05/24/2014  . 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 Cervical spine pain 09/19/2013  . Irritable bowel syndrome (IBS) 06/07/2012  . Restless leg syndrome 05/28/2012  . Anxiety and depression 05/28/2012  . Hypertension, essential, benign 05/28/2012  .  Tobacco use disorder, continuous 05/28/2012  . Hyperlipidemia 05/28/2012  . Encounter for chronic pain management 05/20/2012  . Barrett's esophagus 05/20/2012    Past Surgical History:  Procedure Laterality Date  . ABDOMINAL HYSTERECTOMY  1992   Total for chronic pelvic pain   . APPENDECTOMY    . BACK SURGERY  1990's   L4-L5 fusion @ [place]  2007   Performed in [place]   . CRANIECTOMY FOR  CRANIOSYNOSTOSIS  [id number]   "1st time got infected; 2nd time didn't take; fix the 3rd time" (08/17/2013)  . POSTERIOR LUMBAR FUSION  1990's   "took piece off my hip" (08/17/2013)  . TOTAL HIP ARTHROPLASTY Right 12/31/04   AVN after fall injury; performed by Ralene Cork; DePuy S-ROM total hip (metal on metal)     OB History   None      Home Medications    Prior to Admission medications   Medication Sig Start Date End Date Taking? Authorizing Provider  albuterol (PROVENTIL HFA;VENTOLIN HFA) 108 (90 Base) MCG/ACT inhaler Inhale 2 puffs into the lungs every 6 (six) hours as needed for wheezing. 05/14/18  Yes Rogue Bussing, MD  amLODipine (NORVASC) 5 MG tablet Take 1 tablet (5 mg total) by mouth daily. 05/14/18  Yes Rogue Bussing, MD  azithromycin (ZITHROMAX) 250 MG tablet Take 500 mg (2 tabs) day 1 and 250 mg on days 2-5. 05/14/18  Yes Rogue Bussing, MD  benzonatate (TESSALON) 100 MG capsule Take 1 capsule (100 mg total) by mouth 2 (two) times daily as needed for cough. 02/24/17  Yes Rumley, [place] N, DO  CALCIUM-MAGNESIUM-ZINC PO Take 1 tablet by mouth daily.    Yes [provider]  Coenzyme Q10 (CO Q-10 PO) Take 1 capsule by mouth daily.    Yes [provider]  conjugated estrogens (PREMARIN) vaginal cream Place 1 Applicatorful vaginally daily as needed (for hormone imbalance).    Yes [provider]  Desloratadine-Pseudoephedrine (CLARINEX-D 24 HOUR PO) Take 1 tablet by mouth daily as needed (allergies).   Yes [provider]  diclofenac sodium (VOLTAREN) 1 % GEL Apply 2 g topically 4 (four) times daily as needed. 10/31/15  Yes Rosemarie Ax, MD  Ergocalciferol (VITAMIN D2) 2000 UNITS TABS Take 2,000 Units by mouth daily.   Yes [provider]  esomeprazole (NEXIUM) 40 MG capsule Take 1 capsule (40 mg total) by mouth daily at 12 noon. 05/14/18  Yes Rogue Bussing, MD  fluvastatin (LESCOL) 20 MG  capsule Take 20 mg by mouth at bedtime.   Yes [provider]  meloxicam (MOBIC) 15 MG tablet Take 15 mg by mouth daily.   Yes [provider]  Multiple Vitamin (MULITIVITAMIN WITH MINERALS) TABS Take 1 tablet by mouth daily.   Yes [provider]  propranolol (INDERAL) 40 MG tablet Take 1 tablet (40 mg total) by mouth 3 (three) times daily. 05/14/18  Yes Rogue Bussing, MD  Tiotropium Bromide-Olodaterol (STIOLTO RESPIMAT) 2.5-2.5 MCG/ACT AERS Inhale 2 puffs into the lungs daily. 05/14/18  Yes Rogue Bussing, MD  TURMERIC PO Take 1 tablet by mouth daily.    Yes [provider]  HYDROcodone-homatropine (HYCODAN) 5-1.5 MG/5ML syrup Take 5 mLs by mouth every 6 (six) hours as needed for cough. Patient not taking: Reported on 05/24/2018 12/07/17   Rogue Bussing, MD  hydrOXYzine (VISTARIL) 25 MG capsule Take 1 capsule (25 mg total) by mouth at bedtime as needed for itching. Patient  not taking: Reported on 05/24/2018 10/31/15   Rosemarie Ax, MD  mupirocin cream (BACTROBAN) 2 % Apply 1 application topically 2 (two) times daily. Patient not taking: Reported on 05/24/2018 03/13/17   Mayo, Pete Pelt, MD  mupirocin ointment (BACTROBAN) 2 % Place 1 application into the nose 2 (two) times daily. Patient not taking: Reported on 05/24/2018 03/13/17   Sela Hilding, MD  omeprazole (PRILOSEC) 20 MG capsule Take 1 capsule (20 mg total) by mouth daily. Patient not taking: Reported on 05/24/2018 05/14/18   Rogue Bussing, MD  Pitavastatin Calcium 1 MG TABS Take 1 tablet (1 mg total) by mouth at bedtime. Patient not taking: Reported on 05/24/2018 05/19/18   Rogue Bussing, MD  predniSONE (DELTASONE) 50 MG tablet Take 50 mg with breakfast daily. 05/14/18   Rogue Bussing, MD  varenicline (CHANTIX PAK) 0.5 MG X 11 & 1 MG X 42 tablet Take one 0.5 mg tablet once daily for 3 days. Increase to one 0.5 mg tablet twice daily for 4  days. Increase to one 1 mg tablet twice daily. 12/07/17   Rogue Bussing, MD    Family History Family History  Problem Relation Age of Onset  . Hypertension Mother   . Hypertension Father   . Skin cancer Father   . Colon cancer Paternal Grandfather     Social History Social History   Tobacco Use  . Smoking status: Current Every Day Smoker    Packs/day: 1.00    Years: 32.00    Pack years: 32.00    Types: Cigarettes  . Smokeless tobacco: Never Used  Substance Use Topics  . Alcohol use: Yes    Alcohol/week: 0.0 oz    Comment: 08/17/2013 "couple mixed drinks/wk"  . Drug use: No     Allergies   Penicillins; Demerol [meperidine]; Doxycycline hyclate; Gabapentin; Sulfa antibiotics; Levaquin [levofloxacin]; Benadryl [diphenhydramine hcl]; Buprenorphine hcl; Cephalexin; Morphine and related; and Spiriva handihaler [tiotropium bromide monohydrate]   Review of Systems Review of Systems  Constitutional: Negative for chills and fever.  Eyes: Negative for visual disturbance.  Respiratory: Positive for cough and shortness of breath.   Cardiovascular: Negative for chest pain, palpitations and leg swelling.  Gastrointestinal: Negative for abdominal pain, diarrhea, nausea and vomiting.  Neurological: Positive for light-headedness. Negative for syncope, weakness and numbness.  All other systems reviewed and are negative.    Physical Exam Updated Vital Signs BP 124/74 (BP Location: Left Arm)   Pulse 76   Temp 98.3 F (36.8 C) (Oral)   Resp 18   SpO2 98%   Physical Exam  Constitutional: She appears well-developed and well-nourished.  Non-toxic appearance. No distress.  HENT:  Head: Normocephalic and atraumatic.  Right Ear: Tympanic membrane is not perforated, not erythematous, not retracted and not bulging.  Left Ear: Tympanic membrane is not perforated, not erythematous, not retracted and not bulging.  Nose: Nose normal.  Mouth/Throat: Uvula is midline and oropharynx  is clear and moist. No oropharyngeal exudate or posterior oropharyngeal erythema.  Eyes: Pupils are equal, round, and reactive to light. Conjunctivae and EOM are normal. Right eye exhibits no discharge. Left eye exhibits no discharge.  Neck: Normal range of motion and full passive range of motion without pain. Neck supple.  Cardiovascular: Normal rate and regular rhythm.  No murmur heard. Pulses:      Dorsalis pedis pulses are 2+ on the right side, and 2+ on the left side.  Pulmonary/Chest: Effort normal. No accessory muscle usage. No tachypnea. No  respiratory distress. She has wheezes (mild diffuse inspiratory/expiratory). She has no rales.  Abdominal: Soft. She exhibits no distension. There is no tenderness. There is no rigidity, no rebound and no guarding.  Musculoskeletal:       Right lower leg: She exhibits no tenderness and no edema.       Left lower leg: She exhibits no tenderness and no edema.  Lymphadenopathy:    She has no cervical adenopathy.  Neurological: She is alert.  Alert. Clear speech. No facial droop. CNIII-XII are grossly intact intact. Bilateral upper and lower extremities' sensation grossly intact. 5/5 grip strength bilaterally. 5/5 plantar and dorsi flexion bilaterally.  Normal finger to nose bilaterally. Negative pronator drift. Negative Romberg sign. Gait is intact.    Skin: Skin is warm and dry. No rash noted.  Psychiatric: She has a normal mood and affect. Her behavior is normal.  Nursing note and vitals reviewed.    ED Treatments / Results  Labs Results for orders placed or performed during the hospital encounter of 31/54/00  Basic metabolic panel  Result Value Ref Range   Sodium 135 135 - 145 mmol/L   Potassium 4.6 3.5 - 5.1 mmol/L   Chloride 100 (L) 101 - 111 mmol/L   CO2 20 (L) 22 - 32 mmol/L   Glucose, Bld 67 65 - 99 mg/dL   BUN 10 6 - 20 mg/dL   Creatinine, Ser 0.99 0.44 - 1.00 mg/dL   Calcium 9.1 8.9 - 10.3 mg/dL   GFR calc non Af Amer >60 >60  mL/min   GFR calc Af Amer >60 >60 mL/min   Anion gap 15 5 - 15  CBC  Result Value Ref Range   WBC 13.3 (H) 4.0 - 10.5 K/uL   RBC 5.11 3.87 - 5.11 MIL/uL   Hemoglobin 15.1 (H) 12.0 - 15.0 g/dL   HCT 46.8 (H) 36.0 - 46.0 %   MCV 91.6 78.0 - 100.0 fL   MCH 29.5 26.0 - 34.0 pg   MCHC 32.3 30.0 - 36.0 g/dL   RDW 11.6 11.5 - 15.5 %   Platelets 247 150 - 400 K/uL  Urinalysis, Routine w reflex microscopic  Result Value Ref Range   Color, Urine COLORLESS (A) YELLOW   APPearance CLEAR CLEAR   Specific Gravity, Urine 1.004 (L) 1.005 - 1.030   pH 7.0 5.0 - 8.0   Glucose, UA NEGATIVE NEGATIVE mg/dL   Hgb urine dipstick NEGATIVE NEGATIVE   Bilirubin Urine NEGATIVE NEGATIVE   Ketones, ur NEGATIVE NEGATIVE mg/dL   Protein, ur NEGATIVE NEGATIVE mg/dL   Nitrite NEGATIVE NEGATIVE   Leukocytes, UA NEGATIVE NEGATIVE  I-stat troponin, ED  Result Value Ref Range   Troponin i, poc 0.01 0.00 - 0.08 ng/mL   Comment 3           EKG EKG Interpretation  Date/Time:  Monday May 24 2018 13:02:37 EDT Ventricular Rate:  81 PR Interval:  166 QRS Duration: 66 QT Interval:  372 QTC Calculation: 432 R Axis:   48 Text Interpretation:  Normal sinus rhythm Anteroseptal infarct , age undetermined Abnormal ECG No old tracing to compare Confirmed by Virgel Manifold [phone]) on 05/24/2018 4:39:07 PM   Radiology Dg Chest 2 View  Result Date: 05/24/2018 CLINICAL DATA:  Patient to ED c/o dizziness onset around 8am upon getting out of bed - states she had some pressure in her head and felt like she was going to pass out - feeling resolved after laying back down. Patient was recently  treated for b.*comment was truncated* EXAM: CHEST - 2 VIEW COMPARISON:  05/10/2018 FINDINGS: Normal mediastinum and cardiac silhouette. Hyperinflated lungs. Normal pulmonary vasculature. No evidence of effusion, infiltrate, or pneumothorax. No acute bony abnormality. IMPRESSION: Hyperinflated lungs.  No acute findings. Electronically Signed    By: Suzy Bouchard M.D.   On: 05/24/2018 13:54    Procedures Procedures (including critical care time)  Medications Ordered in ED Medications  sodium chloride 0.9 % bolus 500 mL (500 mLs Intravenous New Bag/Given 05/24/18 1733)  ipratropium-albuterol (DUONEB) 0.5-2.5 (3) MG/3ML nebulizer solution 3 mL (3 mLs Nebulization Given 05/24/18 1726)     Initial Impression / Assessment and Plan / ED Course  I have reviewed the triage vital signs and the nursing notes.  Pertinent labs & imaging results that were available during my care of the patient were reviewed by me and considered in my medical decision making (see chart for details).  Patient presents with complaints of feeling lightheaded/as if she may pass out when transitioning from sitting to standing and complaints of a few hours of dyspnea earlier today, sxs did not seem to occur together- dyspnea was persistent whereas lightheadedness was positional- each sx feels resolved at present. Patient nontoxic appearing, in no apparent distress, vitals without significant abnormalities. On exam patient has mild inspiratory/expiratory wheezes. She has no focal neurologic deficits and ambulates with steady gait. Query orthostatic hypotension given position changes prompting lightheadedness sxs, orthostatic not positive in triage, however symptomatic with them. Work-up per triage reviewed: nonspecific leukocytosis at 13.3, no anemia, mild hypochloremia at 100, otherwise no electrolyte abnormalities, CO2 slightly decreased at 20. CXR with hyperinflated lungs, no evidence of pneumonia, effusion, or pneumothorax. EKG without acute STEMI, troponin WNL, sxs resolved, she was not having chest pain at any point, doubt ACS. Low suspicion for pulmonary embolism given resolution as well. Patient's lung sounds are improved following DuoNeb, no wheezing, no respiratory distress. She feels at baseline and is requesting discharge home. Ambulatory without symptoms in  the ED, steady gait with SpO2 remaining > 95%. .Will discharge home with close PCP follow up. I discussed results, treatment plan, need for PCP follow-up, and return precautions with the patient. Provided opportunity for questions, patient confirmed understanding and is in agreement with plan.   Findings and plan of care discussed with supervising physician Dr. Wilson Singer who is in agreement with plan.    Final Clinical Impressions(s) / ED Diagnoses   Final diagnoses:  Dyspnea, unspecified type  Intermittent lightheadedness    ED Discharge Orders    None       Leafy Kindle 05/24/18 2113    Virgel Manifold, MD 05/26/18 1241

## 2018-05-24 NOTE — ED Triage Notes (Signed)
Patient to ED c/o dizziness onset around 8am upon getting out of bed - states she had some pressure in her head and felt like she was going to pass out - feeling resolved after laying back down. Patient was recently treated for bronchitis and COPD exacerbation and reports generalized weakness since. Patient endorses SOB that is new. Resp e/u at rest. Denies CP, no N/V, no fevers. Orthostatic VS done in triage - BP 124/74 sitting and 116/70 standing - patient felt dizziness upon standing. Skin warm/dry.

## 2018-05-24 NOTE — Telephone Encounter (Signed)
Prior approval for Livalo completed via [employer].  Med approved for 05/24/18 - 05/24/19  Prior approval  # [id number].  [place] informed.  Danley Danker, RN [facility] RN)

## 2018-05-24 NOTE — Discharge Instructions (Addendum)
You were seen in the emergency department today for difficulty breathing and for lightheadedness.  Your work-up in the emergency department was reassuring.  Your chest x-ray did not show signs of pneumonia, collapsed lung, or any other major concerns, it did show hyperinflated lungs which is likely due to COPD.  Please utilize your inhaler as needed for trouble breathing.  Regarding her lightheadedness, your labs did not show anemia or any significant electrolyte problems.  As discussed as possibly due to orthostatic hypotension given your position changes prompting symptoms, we cannot be 100% sure.  As discussed we would like you to follow-up with your primary care provider closely within the next 1 to 3 days.  Return to the ER at anytime for new or worsening symptoms including but not limited to trouble breathing, chest pain, passing out, or any other concerns that you may have.

## 2018-08-03 ENCOUNTER — Other Ambulatory Visit: Payer: Self-pay

## 2018-08-03 DIAGNOSIS — J449 Chronic obstructive pulmonary disease, unspecified: Secondary | ICD-10-CM

## 2018-08-03 MED ORDER — ALBUTEROL SULFATE HFA 108 (90 BASE) MCG/ACT IN AERS: 2.0000 | INHALATION_SPRAY | Freq: Four times a day (QID) | RESPIRATORY_TRACT | 5 refills | Status: DC | PRN

## 2018-08-03 NOTE — Telephone Encounter (Signed)
[facility] calling regarding patients rx for proventil inhaler. Originally prescribed by Dr. Ola Spurr- no longer able to fill under her name. Needs new rx sent by new PCP. Call back [phone] Wallace Cullens, RN

## 2018-09-06 ENCOUNTER — Other Ambulatory Visit: Payer: Self-pay

## 2018-09-06 ENCOUNTER — Ambulatory Visit: Payer: Medicaid Other | Admitting: Family Medicine

## 2018-09-06 VITALS — BP 138/80 | HR 60 | Temp 98.1°F | Wt 130.0 lb

## 2018-09-06 DIAGNOSIS — H00019 Hordeolum externum unspecified eye, unspecified eyelid: Secondary | ICD-10-CM | POA: Insufficient documentation

## 2018-09-06 DIAGNOSIS — F17209 Nicotine dependence, unspecified, with unspecified nicotine-induced disorders: Secondary | ICD-10-CM

## 2018-09-06 DIAGNOSIS — M2669 Other specified disorders of temporomandibular joint: Secondary | ICD-10-CM | POA: Diagnosis present

## 2018-09-06 DIAGNOSIS — H00012 Hordeolum externum right lower eyelid: Secondary | ICD-10-CM | POA: Diagnosis not present

## 2018-09-06 LAB — POCT SEDIMENTATION RATE: POCT SED RATE: 11 mm/h (ref 0–22)

## 2018-09-06 MED ORDER — NAPROXEN 500 MG PO TABS: 500.0000 mg | ORAL_TABLET | Freq: Two times a day (BID) | ORAL | 0 refills | Status: DC

## 2018-09-06 NOTE — Progress Notes (Signed)
Subjective   Patient ID: Diana [name]    DOB: 11/25/1956, 62 y.o. female   MRN: [id number]  CC: "Cannot open my jaw"  HPI: Diana [name] is a 62 y.o. female who presents to clinic today for the following:  Right-sided jaw pain: Patient reports 4-day onset of gradual worsening jaw pain more notably over the last 48 hours.  She has no prior history of similar jaw pain.  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  She currently has a full set of dentures but has not seen a dentist in 3 years.  She is a current everyday smoker.  Patient reports decreased appetite due to pain related to chewing and speaking.  She is able to swallow without difficulty and denies history of fevers or chills, change in vision, neck pain, recent illness, or trauma.  Dry right eye: Patient reports feeling of "grittiness in my eye" localized to right eye only.  This began around the time of her jaw pain.  She reports no loss of vision.  Does not wear contacts.  Smoking: Patient is a current everyday smoker with cigarettes only.  She has a 37-pack-year history with 3 prior attempts to quit with one successful attempt for approximately 2.5 years with use of Chantix fell back into smoking after marital difficulties.  Currently has a prescription for Chantix but is not yet ready to quit.  She denies unexplained weight loss, shortness of breath, chest pain.  ROS: see HPI for pertinent.  [place]: Barrett's esophagus, HTN, tobacco use, COPD, HLD, CKDII with history of stones, migraines, RLS.  Right total hip, lumbar fusion, craniectomy, cholecystectomy, appendectomy, TAH.  Family history HTN, answer (skin, father). Smoking status reviewed. Medications reviewed.  Objective   BP 138/80   Pulse 60   Temp 98.1 F (36.7 C) (Oral)   Wt 130 lb (59 kg)   SpO2 99%   BMI 24.56 kg/m  Vitals and nursing note reviewed.  General: well nourished, well developed, NAD with non-toxic  appearance HEENT: normocephalic, atraumatic, moist mucous membranes, PERRLA, EOMI, clear ear canals bilaterally with gray TMs, small stye in right eye on anterior surface with minimal associated conjunctival erythema Neck: supple, non-tender without lymphadenopathy, full passive range of motion intact at cervical spine Cardiovascular: regular rate and rhythm without murmurs, rubs, or gallops Lungs: clear to auscultation bilaterally with normal work of breathing Skin: warm, dry, no rashes or lesions, cap refill < 2 seconds Extremities: warm and well perfused, normal tone, no edema  Assessment & Plan   Stye Acute.  Likely contributing to sensation of dry eye.  Signs of secondary bacterial infection. - Advised conservative management and avoid rubbing and consider over-the-counter eyedrops if needed  Jaw claudication Acute.  Likely TMJ.  Concerning for possible temporal arteritis given new onset associated right-sided headache not consistent with typical migraine.  No change in vision which is reassuring.  Patient is without constitutional symptoms or sign of abscess. - Checking ESR - Given prescription for naproxen 500 mg twice daily - Reviewed return precautions - RTC 2 months  Tobacco use disorder, continuous Chronic.  Not interested in cessation today.  Has Chantix at home, not yet started. - Advised to start Chantix when ready  Orders Placed This Encounter  Procedures  . POCT SEDIMENTATION RATE   Meds ordered this encounter  Medications  . naproxen (NAPROSYN) 500 MG tablet    Sig: Take 1 tablet (500  mg total) by mouth 2 (two) times daily with a meal.    Dispense:  10 tablet    Refill:  0    Harriet Butte, DO [place], PGY-3 09/06/2018, 5:14 PM

## 2018-09-06 NOTE — Assessment & Plan Note (Signed)
Acute.  Likely TMJ.  Concerning for possible temporal arteritis given new onset associated right-sided headache not consistent with typical migraine.  No change in vision which is reassuring.  Patient is without constitutional symptoms or sign of abscess. - Checking ESR - Given prescription for naproxen 500 mg twice daily - Reviewed return precautions - RTC 2 months

## 2018-09-06 NOTE — Assessment & Plan Note (Signed)
Acute.  Likely contributing to sensation of dry eye.  Signs of secondary bacterial infection. - Advised conservative management and avoid rubbing and consider over-the-counter eyedrops if needed

## 2018-09-06 NOTE — Patient Instructions (Signed)
Thank you for coming in to see Diana [name] today. Please see below to review our plan for today's visit.  1.  I do feel that your jaw pain is related to your TMJ.  Please call and schedule an appointment with a local dentist.  You can reach out to [facility] to find a local dentist office. 2.  For pain, continue applying cold compress to the area and take the naproxen twice daily.   3.  I will call you regarding your blood work and I received the test results later today.  We will have you follow-up in 2 days.  Go to the emergency room immediately if you have any change her vision or if you have more difficulty opening your jaw.  Please call the clinic at [phone] if your symptoms worsen or you have any concerns. It was our pleasure to serve you.  Harriet Butte, [place], PGY-3

## 2018-09-06 NOTE — Assessment & Plan Note (Signed)
Chronic.  Not interested in cessation today.  Has Chantix at home, not yet started. - Advised to start Chantix when ready

## 2018-09-13 ENCOUNTER — Telehealth: Payer: Self-pay | Admitting: Family Medicine

## 2018-09-13 DIAGNOSIS — M9903 Segmental and somatic dysfunction of lumbar region: Secondary | ICD-10-CM | POA: Diagnosis not present

## 2018-09-13 DIAGNOSIS — M9902 Segmental and somatic dysfunction of thoracic region: Secondary | ICD-10-CM | POA: Diagnosis not present

## 2018-09-13 DIAGNOSIS — M5418 Radiculopathy, sacral and sacrococcygeal region: Secondary | ICD-10-CM | POA: Diagnosis not present

## 2018-09-13 DIAGNOSIS — M9904 Segmental and somatic dysfunction of sacral region: Secondary | ICD-10-CM | POA: Diagnosis not present

## 2018-09-13 DIAGNOSIS — M5414 Radiculopathy, thoracic region: Secondary | ICD-10-CM | POA: Diagnosis not present

## 2018-09-13 DIAGNOSIS — M5416 Radiculopathy, lumbar region: Secondary | ICD-10-CM | POA: Diagnosis not present

## 2018-09-13 NOTE — Telephone Encounter (Signed)
Pt would like to know if Dr. Lindell Noe could send a referral to [facility] for spots on her face. Pt said the number to the office is [phone]. Please call pt at [phone] if this is possible.

## 2018-09-13 NOTE — Telephone Encounter (Signed)
Could the patient clarify the spots and what she means? She was seen here recently but not for dermatology concerns. I can't send a referral without a diagnosis to use. It would probably be best that she was seen for this. We may be able to help her without sending her to the dermatologist.

## 2018-09-15 ENCOUNTER — Ambulatory Visit: Payer: Medicaid Other | Admitting: Family Medicine

## 2018-09-15 DIAGNOSIS — M5418 Radiculopathy, sacral and sacrococcygeal region: Secondary | ICD-10-CM | POA: Diagnosis not present

## 2018-09-15 DIAGNOSIS — M9902 Segmental and somatic dysfunction of thoracic region: Secondary | ICD-10-CM | POA: Diagnosis not present

## 2018-09-15 DIAGNOSIS — M9904 Segmental and somatic dysfunction of sacral region: Secondary | ICD-10-CM | POA: Diagnosis not present

## 2018-09-15 DIAGNOSIS — M5414 Radiculopathy, thoracic region: Secondary | ICD-10-CM | POA: Diagnosis not present

## 2018-09-15 DIAGNOSIS — M5416 Radiculopathy, lumbar region: Secondary | ICD-10-CM | POA: Diagnosis not present

## 2018-09-15 DIAGNOSIS — M9903 Segmental and somatic dysfunction of lumbar region: Secondary | ICD-10-CM | POA: Diagnosis not present

## 2018-09-15 NOTE — Telephone Encounter (Signed)
Informed pt of below and appointment scheduled for 09/20/2018. Katharina Caper, April D, [place]

## 2018-09-20 ENCOUNTER — Encounter: Payer: Self-pay | Admitting: Family Medicine

## 2018-09-20 ENCOUNTER — Ambulatory Visit: Payer: Medicaid Other | Admitting: Family Medicine

## 2018-09-20 ENCOUNTER — Other Ambulatory Visit: Payer: Self-pay

## 2018-09-20 VITALS — BP 152/86 | HR 72 | Temp 98.1°F | Ht 61.0 in | Wt 134.6 lb

## 2018-09-20 DIAGNOSIS — M9904 Segmental and somatic dysfunction of sacral region: Secondary | ICD-10-CM | POA: Diagnosis not present

## 2018-09-20 DIAGNOSIS — M542 Cervicalgia: Secondary | ICD-10-CM

## 2018-09-20 DIAGNOSIS — M9903 Segmental and somatic dysfunction of lumbar region: Secondary | ICD-10-CM | POA: Diagnosis not present

## 2018-09-20 DIAGNOSIS — Z23 Encounter for immunization: Secondary | ICD-10-CM

## 2018-09-20 DIAGNOSIS — M9902 Segmental and somatic dysfunction of thoracic region: Secondary | ICD-10-CM | POA: Diagnosis not present

## 2018-09-20 DIAGNOSIS — I10 Essential (primary) hypertension: Secondary | ICD-10-CM | POA: Diagnosis not present

## 2018-09-20 DIAGNOSIS — M5418 Radiculopathy, sacral and sacrococcygeal region: Secondary | ICD-10-CM | POA: Diagnosis not present

## 2018-09-20 DIAGNOSIS — Z1239 Encounter for other screening for malignant neoplasm of breast: Secondary | ICD-10-CM

## 2018-09-20 DIAGNOSIS — M5414 Radiculopathy, thoracic region: Secondary | ICD-10-CM | POA: Diagnosis not present

## 2018-09-20 DIAGNOSIS — M5416 Radiculopathy, lumbar region: Secondary | ICD-10-CM | POA: Diagnosis not present

## 2018-09-20 DIAGNOSIS — L570 Actinic keratosis: Secondary | ICD-10-CM

## 2018-09-20 MED ORDER — PREDNISONE 50 MG PO TABS: ORAL_TABLET | ORAL | 0 refills | Status: DC

## 2018-09-20 NOTE — Patient Instructions (Addendum)
It was a pleasure to see you today! Thank you for choosing [facility] for your primary care. Diana [name] was seen for skin lesion, neck pain.   Our plans for today were:  I sent the prednisone for your neck, please take with food. Call if you have concerns or worsening.   I sent the referral back to chiro.   The skin on your face will make small blisters, and it will grow back with healthy skin. If you have concerns or notice new lesions please give [place] a call.   Come back in 1 month to recheck your blood pressure or call if it stays up.  Best,  Dr. Lindell Noe

## 2018-09-20 NOTE — Progress Notes (Signed)
   CC: skin changes, neck pain  HPI  Someone at a beauty clinic noticed some rough spots on bilateral nasal creases. Been there a year or so. No other hx of skin cancer. No fam hx of skin cancer. Used to use tanning beds. No regular sunscreen now. Somewhat itchy. Feel more dry than previous.   Neck pain - hx of injections to this area. Inhibiting her sleep because of the pain.  No hx of surgery or fractures. Prednisone seemed to help w this in the past, last had this several months ago. Diana [name] has been helpful for this. No new injury. Has flared up over the last 2 weeks and worse the last two days.   ROS: Denies CP, SOB, abdominal pain, dysuria, changes in BMs.   CC, SH/smoking status, and VS noted  Objective: BP (!) 152/86   Pulse 72   Temp 98.1 F (36.7 C) (Oral)   Ht 5\' 1"  (1.549 m)   Wt 134 lb 9.6 oz (61.1 kg)   SpO2 99%   BMI 25.43 kg/m  Gen: NAD, alert, cooperative, and pleasant. HEENT: NCAT, EOMI, PERRL. AK to L nasolabial fold, R cheek. Pain over C spine without stepoffs.  CV: RRR, no murmur Resp: CTAB, no wheezes, non-labored Abd: SNTND, BS present, no guarding or organomegaly Ext: No edema, warm Neuro: Alert and oriented, Speech clear, No gross deficits  Cyrotherapy - obtained verbal consent from patient for cyrotherapy of two actinic keratoses as described. Liquid nitrogen applied by Qtip to 2 areas with adequate freezing achieved. Patient tolerated well, counseled on after care.   Assessment and plan:  Actinic keratoses - cryotherapy performed today, return if concerns or new lesions.   HTN - likely elevated 2/2 neck pain, recheck and return in 1 month.   Hypertension, essential, benign BP elevated today, patient states 2/2 pain, controlled.   Cervical spine pain Exacerbation of DDD in C spine today, no new trauma. 5d prednisone pack, take with food. Counseled on risk of hypertension and hyperglycemia with this med. Patient requests referral back to chiropractor,  placed.    Orders Placed This Encounter  Procedures  . MM Digital Screening    Standing Status:   Future    Standing Expiration Date:   11/21/2019    Order Specific Question:   Reason for Exam (SYMPTOM  OR DIAGNOSIS REQUIRED)    Answer:   screening for breast cancer    Order Specific Question:   Is the patient pregnant?    Answer:   No    Order Specific Question:   Preferred imaging location?    Answer:   [facility]  . Flu Vaccine QUAD 36+ mos IM  . Ambulatory referral to Chiropractic    Referral Priority:   Routine    Referral Type:   Chiropractic    Referral Reason:   Specialty Services Required    Requested Specialty:   Chiropractic Medicine    Number of Visits Requested:   1    Meds ordered this encounter  Medications  . predniSONE (DELTASONE) 50 MG tablet    Sig: Take 50 mg with breakfast daily.    Dispense:  5 tablet    Refill:  0    Health Maintenance reviewed - mammogram ordered, patient to schedule appointment.  Diana Ok, MD, PGY3 09/22/2018 4:01 PM

## 2018-09-22 DIAGNOSIS — M5416 Radiculopathy, lumbar region: Secondary | ICD-10-CM | POA: Diagnosis not present

## 2018-09-22 DIAGNOSIS — M9904 Segmental and somatic dysfunction of sacral region: Secondary | ICD-10-CM | POA: Diagnosis not present

## 2018-09-22 DIAGNOSIS — M9903 Segmental and somatic dysfunction of lumbar region: Secondary | ICD-10-CM | POA: Diagnosis not present

## 2018-09-22 DIAGNOSIS — M9902 Segmental and somatic dysfunction of thoracic region: Secondary | ICD-10-CM | POA: Diagnosis not present

## 2018-09-22 DIAGNOSIS — M5418 Radiculopathy, sacral and sacrococcygeal region: Secondary | ICD-10-CM | POA: Diagnosis not present

## 2018-09-22 DIAGNOSIS — M5414 Radiculopathy, thoracic region: Secondary | ICD-10-CM | POA: Diagnosis not present

## 2018-09-22 NOTE — Assessment & Plan Note (Signed)
Exacerbation of DDD in C spine today, no new trauma. 5d prednisone pack, take with food. Counseled on risk of hypertension and hyperglycemia with this med. Patient requests referral back to chiropractor, placed.

## 2018-09-22 NOTE — Assessment & Plan Note (Signed)
BP elevated today, patient states 2/2 pain, controlled.

## 2018-09-24 DIAGNOSIS — M5416 Radiculopathy, lumbar region: Secondary | ICD-10-CM | POA: Diagnosis not present

## 2018-09-24 DIAGNOSIS — M9902 Segmental and somatic dysfunction of thoracic region: Secondary | ICD-10-CM | POA: Diagnosis not present

## 2018-09-24 DIAGNOSIS — M9903 Segmental and somatic dysfunction of lumbar region: Secondary | ICD-10-CM | POA: Diagnosis not present

## 2018-09-24 DIAGNOSIS — M5418 Radiculopathy, sacral and sacrococcygeal region: Secondary | ICD-10-CM | POA: Diagnosis not present

## 2018-09-24 DIAGNOSIS — M9904 Segmental and somatic dysfunction of sacral region: Secondary | ICD-10-CM | POA: Diagnosis not present

## 2018-09-24 DIAGNOSIS — M5414 Radiculopathy, thoracic region: Secondary | ICD-10-CM | POA: Diagnosis not present

## 2018-09-29 ENCOUNTER — Telehealth: Payer: Self-pay | Admitting: Family Medicine

## 2018-09-29 NOTE — Telephone Encounter (Signed)
Called patient and left VM. I cannot order an MRI without seeing her for her back issues, as her insurance will not cover this without an appropriate assessment and documentation.  Offered that she may try to find an earlier appointment with alternative white team provider.  Gave clinic call back number.

## 2018-09-29 NOTE — Telephone Encounter (Signed)
Pt is calling to see if Dr. Lajuana Carry can place a referral for an MRI. She went to the chiropractor that she referred her to and he is concerned the issues with her back are getting worse. Chiropractor suggested coming back to pcp for an MRI. She has an appointment with Dr. Lindell Noe on the 10/25/18 but would like to have this done sooner. Pt would like for someone to call her at [phone].

## 2018-10-06 DIAGNOSIS — J209 Acute bronchitis, unspecified: Secondary | ICD-10-CM | POA: Diagnosis not present

## 2018-10-06 DIAGNOSIS — R05 Cough: Secondary | ICD-10-CM | POA: Diagnosis not present

## 2018-10-06 DIAGNOSIS — J0100 Acute maxillary sinusitis, unspecified: Secondary | ICD-10-CM | POA: Diagnosis not present

## 2018-10-21 DIAGNOSIS — K625 Hemorrhage of anus and rectum: Secondary | ICD-10-CM | POA: Diagnosis not present

## 2018-10-21 DIAGNOSIS — K529 Noninfective gastroenteritis and colitis, unspecified: Secondary | ICD-10-CM | POA: Diagnosis not present

## 2018-10-21 DIAGNOSIS — R1030 Lower abdominal pain, unspecified: Secondary | ICD-10-CM | POA: Diagnosis not present

## 2018-10-22 ENCOUNTER — Ambulatory Visit: Payer: Medicaid Other | Admitting: Family Medicine

## 2018-10-22 ENCOUNTER — Telehealth: Payer: Self-pay | Admitting: Family Medicine

## 2018-10-22 ENCOUNTER — Other Ambulatory Visit: Payer: Self-pay

## 2018-10-22 VITALS — BP 118/66 | HR 72 | Temp 97.7°F | Wt 135.0 lb

## 2018-10-22 DIAGNOSIS — K625 Hemorrhage of anus and rectum: Secondary | ICD-10-CM | POA: Diagnosis not present

## 2018-10-22 DIAGNOSIS — N898 Other specified noninflammatory disorders of vagina: Secondary | ICD-10-CM | POA: Insufficient documentation

## 2018-10-22 HISTORY — DX: Hemorrhage of anus and rectum: K62.5

## 2018-10-22 MED ORDER — FLUCONAZOLE 150 MG PO TABS: 150.0000 mg | ORAL_TABLET | Freq: Once | ORAL | 0 refills | Status: AC

## 2018-10-22 NOTE — Assessment & Plan Note (Signed)
Rectal bleeding associated with "colitis".  Patient is receiving treatment for likely closure and is still.  Without access to records however cannot confirm a diagnosis.  Overall patient is doing well including tolerating p.o.  She does not look to be anemic.  Appears well-hydrated.  Recommend patient continue current course of therapy.  We will get release of records from [place].  Patient is to follow-up with PCP on the following Monday.  Strict return precautions provided.

## 2018-10-22 NOTE — Telephone Encounter (Signed)
Family medicine telephone encounter note  **Received page on after-hours line**  Patient has diffuse itching which started earlier this afternoon.  Patient was seen at [facility] emergency department in p.m. of 10/21/2018 and saw Dr. Grandville Silos in clinic at 1600 on 11/22.  Patient said that she received antibiotics at this emergency department visit.  It is unclear which antibiotics were given, given patient's numerous allergies.  Diffuse itching 16-24 hrs. after administration could be consistent with very minor allergic reaction.  She has no airway closure or any distressing symptoms.  Initially recommended picking up over-the-counter Benadryl to help with itching. Unfortunately patient has an allergy to this medication, making her break out in a rash.  Informed her that unfortunately I cannot prescribe any medications to the emergency line.  Recommended applying topical steroid-containing compounds to affected areas tonight and if her symptoms do not resolve to make an appointment with urgent care in the a.m. of 10/23/2018.  She has taken Atarax before with good results.  If continuing to itch would likely recommend  Prescribing that medication again. Patient voiced understanding and was aware that if she developed any alarming symptoms she would need to go to the ED.  Guadalupe Dawn MD PGY-2 Family Medicine Resident

## 2018-10-22 NOTE — Assessment & Plan Note (Signed)
Likely post antibiotic yeast infection.  Trial Diflucan x1.  Follow-up PCP.

## 2018-10-22 NOTE — Progress Notes (Signed)
Acute Office Visit  Subjective:    Patient ID: Diana [name], female    DOB: Jul 19, 1956, 62 y.o.   MRN: [id number]  No chief complaint on file.    Patient went to [facility] ED.  Patient has had full work-up for abdominal pain.  Was told that she has "colitis".  Patient just finished a course of antibiotics for upper respiratory infection.  Patient is unsure if she had C. difficile colitis.  Records not available upon review of EMR, patient brought discharge for work, however does not talk about any of her diagnostic testing.  Patient does report that she had a "full work-up" including CT abdomen, urine testing, etc.  Does not know the results of this test.  Patient was discharged with ciprofloxacin and Flagyl.  She has been taking these.  Patient is also been using oxycodone as needed pain.  Patient says that the pain is improved, however she is had persistent rectal bleeding.  Patient's last colonoscopy was in 2015.  Showed mild diverticulosis, no polyps.  Patient says the symptoms are improving.  Patient also reports vaginal discharge.  Says that she has had it since she completed her course of antibiotics for her upper respiratory infection.  Says that she had a history of yeast infections status post antibiotics.  She has no dysuria, hematuria.  Abdominal Pain  This is a new problem. The current episode started yesterday. The onset quality is sudden. The problem occurs constantly. The problem has been unchanged. The pain is located in the suprapubic region. The pain is at a severity of 9/10. The pain is severe. The abdominal pain radiates to the suprapubic region. Associated symptoms include hematochezia, nausea and vomiting. Pertinent negatives include no fever. The pain is aggravated by eating. The pain is relieved by nothing. She has tried oral narcotic analgesics for the symptoms. The treatment provided moderate relief. Prior diagnostic workup includes CT scan. Diverticulosis   Past Medical  History:  Diagnosis Date  . Anxiety   . Asthma   . Barrett esophagus    "don't know that I've ever been stretched" (08/17/2013)  . Choledocholithiasis with acute cholecystitis   . Chronic lower back pain   . Craniosynostosis    congenital  . DDD (degenerative disc disease)   . Family history of anesthesia complication    "PONV; both parents" (08/17/2013)  . GERD (gastroesophageal reflux disease)   . H/O hiatal hernia   . History of blood transfusion 1959-1960  . Hypercholesterolemia 05/28/2012   Has taken Crestor and Lipitor in the past. Switched to lovastatin b/c its $4.    . Hypertension   . IBS (irritable bowel syndrome)   . Migraines    "since age 65; probably twice/month" (08/17/2013)  . PONV (postoperative nausea and vomiting)   . Rheumatoid arthritis ([place])    "in my knuckles" (08/17/2013)  . Ruptured lumbar disc 1990's   L4-L5  . Shingles     Past Surgical History:  Procedure Laterality Date  . ABDOMINAL HYSTERECTOMY  1992   Total for chronic pelvic pain   . APPENDECTOMY    . BACK SURGERY  1990's   L4-L5 fusion @ [place]  2007   Performed in [place]   . CRANIECTOMY FOR CRANIOSYNOSTOSIS  [id number]   "1st time got infected; 2nd time didn't take; fix the 3rd time" (08/17/2013)  . POSTERIOR LUMBAR FUSION  1990's   "took piece off my hip" (08/17/2013)  . TOTAL HIP ARTHROPLASTY Right 12/31/04  AVN after fall injury; performed by Ralene Cork; DePuy S-ROM total hip (metal on metal)    Family History  Problem Relation Age of Onset  . Hypertension Mother   . Hypertension Father   . Skin cancer Father   . Colon cancer Paternal Grandfather     Social History   Socioeconomic History  . Marital status: Divorced    Spouse name: Not on file  . Number of children: Not on file  . Years of education: Not on file  . Highest education level: Not on file  Occupational History  . Not on file  Social Needs  . Financial resource strain: Not  on file  . Food insecurity:    Worry: Not on file    Inability: Not on file  . Transportation needs:    Medical: Not on file    Non-medical: Not on file  Tobacco Use  . Smoking status: Current Every Day Smoker    Packs/day: 1.00    Years: 32.00    Pack years: 32.00    Types: Cigarettes  . Smokeless tobacco: Never Used  Substance and Sexual Activity  . Alcohol use: Yes    Alcohol/week: 0.0 standard drinks    Comment: 08/17/2013 "couple mixed drinks/wk"  . Drug use: No  . Sexual activity: Not Currently  Lifestyle  . Physical activity:    Days per week: Not on file    Minutes per session: Not on file  . Stress: Not on file  Relationships  . Social connections:    Talks on phone: Not on file    Gets together: Not on file    Attends religious service: Not on file    Active member of club or organization: Not on file    Attends meetings of clubs or organizations: Not on file    Relationship status: Not on file  . Intimate partner violence:    Fear of current or ex partner: Not on file    Emotionally abused: Not on file    Physically abused: Not on file    Forced sexual activity: Not on file  Other Topics Concern  . Not on file  Social History Narrative  . Not on file    Outpatient Medications Prior to Visit  Medication Sig Dispense Refill  . albuterol (PROVENTIL HFA;VENTOLIN HFA) 108 (90 Base) MCG/ACT inhaler Inhale 2 puffs into the lungs every 6 (six) hours as needed for wheezing. 8.5 g 5  . amLODipine (NORVASC) 5 MG tablet Take 1 tablet (5 mg total) by mouth daily. 90 tablet 3  . CALCIUM-MAGNESIUM-ZINC PO Take 1 tablet by mouth daily.     . Coenzyme Q10 (CO Q-10 PO) Take 1 capsule by mouth daily.     Marland Kitchen conjugated estrogens (PREMARIN) vaginal cream Place 1 Applicatorful vaginally daily as needed (for hormone imbalance).     . Desloratadine-Pseudoephedrine (CLARINEX-D 24 HOUR PO) Take 1 tablet by mouth daily as needed (allergies).    . diclofenac sodium (VOLTAREN) 1 % GEL  Apply 2 g topically 4 (four) times daily as needed. 100 g 5  . Ergocalciferol (VITAMIN D2) 2000 UNITS TABS Take 2,000 Units by mouth daily.    Marland Kitchen esomeprazole (NEXIUM) 40 MG capsule Take 1 capsule (40 mg total) by mouth daily at 12 noon. 90 capsule 1  . fluvastatin (LESCOL) 20 MG capsule Take 20 mg by mouth at bedtime.    . meloxicam (MOBIC) 15 MG tablet Take 15 mg by mouth daily.    . Multiple  Vitamin (MULITIVITAMIN WITH MINERALS) TABS Take 1 tablet by mouth daily.    . naproxen (NAPROSYN) 500 MG tablet Take 1 tablet (500 mg total) by mouth 2 (two) times daily with a meal. 10 tablet 0  . predniSONE (DELTASONE) 50 MG tablet Take 50 mg with breakfast daily. 5 tablet 0  . propranolol (INDERAL) 40 MG tablet Take 1 tablet (40 mg total) by mouth 3 (three) times daily. 90 tablet 3  . Tiotropium Bromide-Olodaterol (STIOLTO RESPIMAT) 2.5-2.5 MCG/ACT AERS Inhale 2 puffs into the lungs daily. 2 Inhaler 3  . TURMERIC PO Take 1 tablet by mouth daily.     . varenicline (CHANTIX PAK) 0.5 MG X 11 & 1 MG X 42 tablet Take one 0.5 mg tablet once daily for 3 days. Increase to one 0.5 mg tablet twice daily for 4 days. Increase to one 1 mg tablet twice daily. 53 tablet 0   No facility-administered medications prior to visit.     Allergies  Allergen Reactions  . Penicillins Anaphylaxis  . Demerol [Meperidine] Nausea And Vomiting  . Doxycycline Hyclate Nausea And Vomiting  . Gabapentin Other (See Comments)    Malaise  . Sulfa Antibiotics Hives  . Levaquin [Levofloxacin]     Reported some possible facial swelling with administration 05/2018  . Benadryl [Diphenhydramine Hcl] Rash  . Buprenorphine Hcl Nausea And Vomiting  . Cephalexin Rash  . Morphine And Related Nausea And Vomiting  . Spiriva Handihaler [Tiotropium Bromide Monohydrate] Rash    Review of Systems  Constitutional: Negative for chills and fever.  Gastrointestinal: Positive for abdominal pain, hematochezia, nausea and vomiting.  All other  systems reviewed and are negative.      Objective:    Physical Exam  Constitutional: She is oriented to person, place, and time. She appears well-developed and well-nourished. No distress.  HENT:  Head: Normocephalic and atraumatic.  Mouth/Throat: Oropharynx is clear and moist.  Eyes: Conjunctivae are normal. No scleral icterus.  Neck: No JVD present.  Cardiovascular: Normal rate and regular rhythm.  Pulmonary/Chest: Effort normal and breath sounds normal. No respiratory distress.  Abdominal: There is tenderness in the suprapubic area.  Neurological: She is alert and oriented to person, place, and time.  Skin: Skin is warm and dry.  Cap refill < 3  Psychiatric: She has a normal mood and affect. Her behavior is normal.    BP 118/66   Pulse 72   Temp 97.7 F (36.5 C) (Oral)   Wt 135 lb (61.2 kg)   SpO2 97%   BMI 25.51 kg/m  Wt Readings from Last 3 Encounters:  10/22/18 135 lb (61.2 kg)  09/20/18 134 lb 9.6 oz (61.1 kg)  09/06/18 130 lb (59 kg)    Health Maintenance Due  Topic Date Due  . MAMMOGRAM  04/08/2014    There are no preventive care reminders to display for this patient.   Lab Results  Component Value Date   TSH 2.184 10/31/2015   Lab Results  Component Value Date   WBC 13.3 (H) 05/24/2018   HGB 15.1 (H) 05/24/2018   HCT 46.8 (H) 05/24/2018   MCV 91.6 05/24/2018   PLT 247 05/24/2018   Lab Results  Component Value Date   NA 135 05/24/2018   K 4.6 05/24/2018   CO2 20 (L) 05/24/2018   GLUCOSE 67 05/24/2018   BUN 10 05/24/2018   CREATININE 0.99 05/24/2018   BILITOT 0.9 10/31/2015   ALKPHOS 79 10/31/2015   AST 19 10/31/2015   ALT 19  10/31/2015   PROT 6.9 10/31/2015   ALBUMIN 4.1 10/31/2015   CALCIUM 9.1 05/24/2018   ANIONGAP 15 05/24/2018   Lab Results  Component Value Date   CHOL 276 (H) 12/07/2017   Lab Results  Component Value Date   HDL 56 12/07/2017   Lab Results  Component Value Date   LDLCALC 193 (H) 12/07/2017   Lab Results   Component Value Date   TRIG 135 12/07/2017   Lab Results  Component Value Date   CHOLHDL 4.9 (H) 12/07/2017   No results found for: HGBA1C     Assessment & Plan:   Problem List Items Addressed This Visit      Digestive   Rectal bleeding - Primary    Rectal bleeding associated with "colitis".  Patient is receiving treatment for likely closure and is still.  Without access to records however cannot confirm a diagnosis.  Overall patient is doing well including tolerating p.o.  She does not look to be anemic.  Appears well-hydrated.  Recommend patient continue current course of therapy.  We will get release of records from [place].  Patient is to follow-up with PCP on the following Monday.  Strict return precautions provided.      Relevant Orders   Ambulatory referral to Gastroenterology     Other   Vaginal discharge    Likely post antibiotic yeast infection.  Trial Diflucan x1.  Follow-up PCP.      Relevant Medications   fluconazole (DIFLUCAN) 150 MG tablet       Meds ordered this encounter  Medications  . fluconazole (DIFLUCAN) 150 MG tablet    Sig: Take 1 tablet (150 mg total) by mouth once for 1 dose.    Dispense:  1 tablet    Refill:  0     Bonnita Hollow, MD

## 2018-10-25 ENCOUNTER — Ambulatory Visit: Payer: Medicaid Other | Admitting: Family Medicine

## 2018-10-25 ENCOUNTER — Encounter: Payer: Self-pay | Admitting: Family Medicine

## 2018-10-25 ENCOUNTER — Ambulatory Visit (HOSPITAL_COMMUNITY)
Admission: RE | Admit: 2018-10-25 | Discharge: 2018-10-25 | Disposition: A | Discharge status: Home / Self Care | Payer: Medicaid Other | Source: Ambulatory Visit | Attending: Family Medicine | Admitting: Family Medicine

## 2018-10-25 ENCOUNTER — Other Ambulatory Visit: Payer: Self-pay

## 2018-10-25 VITALS — BP 128/82 | HR 63 | Temp 97.9°F | Ht 61.0 in | Wt 133.4 lb

## 2018-10-25 DIAGNOSIS — M542 Cervicalgia: Secondary | ICD-10-CM

## 2018-10-25 DIAGNOSIS — K625 Hemorrhage of anus and rectum: Secondary | ICD-10-CM | POA: Diagnosis not present

## 2018-10-25 DIAGNOSIS — M25511 Pain in right shoulder: Secondary | ICD-10-CM

## 2018-10-25 DIAGNOSIS — F17209 Nicotine dependence, unspecified, with unspecified nicotine-induced disorders: Secondary | ICD-10-CM | POA: Diagnosis not present

## 2018-10-25 MED ORDER — METHYLPREDNISOLONE ACETATE 40 MG/ML IJ SUSP
40.0000 mg | Freq: Once | INTRAMUSCULAR | Status: AC
Start: 2018-10-25 — End: 2018-10-25
  Administered 2018-10-25: 40 mg via INTRAMUSCULAR

## 2018-10-25 MED ORDER — DICLOFENAC SODIUM 1 % TD GEL: 2.0000 g | Freq: Four times a day (QID) | TRANSDERMAL | 5 refills | Status: AC | PRN

## 2018-10-25 MED ORDER — METHYLPREDNISOLONE ACETATE 40 MG/ML IJ SUSP
40.0000 mg | Freq: Once | INTRAMUSCULAR | Status: AC
  Administered 2018-10-25: 40 mg via INTRAMUSCULAR

## 2018-10-25 NOTE — Progress Notes (Signed)
CC: abd pain, shoulder pain   HPI  Lower abd pain - started Thursday, about 1.5 hours, lost 2tbsp of BRBPR every hour or so. Got cipro and flagyl from ED ([place]) - had CT scan with contrast there. Thinks this was 11/20 or 11/21. Friday night she had itching all over, back and arm. Skin looked splotchy. This went away completely, still taking antibiotics.   Shoulder pain - can't move her R shoulder without pain, hasn't gotten any better after the steroid. Has tried voltaren gel, would like to try this again.   C spine pain - prednisone didn't resolve this. She hasn't seen a Psychologist, sport and exercise. Also with R arm sxs as above. No weakness reported. Aggravated by caring for her grandson.   ROS: Denies CP, SOB, abdominal pain, dysuria, changes in BMs.   CC, SH/smoking status, and VS noted  Objective: BP 128/82   Pulse 63   Temp 97.9 F (36.6 C) (Oral)   Ht 5\' 1"  (1.549 m)   Wt 133 lb 6.4 oz (60.5 kg)   SpO2 97%   BMI 25.21 kg/m  Gen: NAD, alert, cooperative, and pleasant. HEENT: NCAT, EOMI, PERRL CV: RRR, no murmur Resp: CTAB, no wheezes, non-labored Abd: SNTND, BS present, no guarding or organomegaly MSK: tenderness to palpation over C spine, mild paraspinal tenderness. R shoulder pain with liftoff, pain with empty can.  Neuro: Alert and oriented, Speech clear, No gross deficits  PROCEDURE: INJECTION: Patient gave verbal consent after discussing risks and benefits. Appropriate time out was taken. Area prepped and draped in usual sterile fashion. Ethyl chloride was used for local anesthesia. A 21 gauge 1 1/2 inch needle was used.. 2 cc of methylprednisolone 40 mg/ml plus  4 cc of 1% lidocaine without epinephrine was injected into the R posterior shoulder using a(n) posterior approach.   The patient tolerated the procedure well. There were no complications. Post procedure instructions were given.  Assessment and plan:  Cervical spine pain Will repeat xr given continued pain,  discussed that surgery may not be a perfect fix if she elects to see the surgeon. Will decide based on xr findings.   Rectal bleeding Sounds like diverticulitis is much improved after appropriate treatment at [facility]. Continue antibiotics to finish course.   R Shoulder pain - suspect combination of OA and impingment based on pain with liftoff. Appreciate Dr. Mee Hives help with exam and treatment. Steroid injection performed as both treatment and diagnostic test.   Orders Placed This Encounter  Procedures  . DG Cervical Spine Complete    Standing Status:   Future    Number of Occurrences:   1    Standing Expiration Date:   12/26/2019    Order Specific Question:   Reason for Exam (SYMPTOM  OR DIAGNOSIS REQUIRED)    Answer:   radicular pain    Order Specific Question:   Is patient pregnant?    Answer:   No    Order Specific Question:   Preferred imaging location?    Answer:   [facility]    Order Specific Question:   Radiology Contrast Protocol - do NOT remove file path    Answer:   [email or web address]    Meds ordered this encounter  Medications  . diclofenac sodium (VOLTAREN) 1 % GEL    Sig: Apply 2 g topically 4 (four) times daily as needed.    Dispense:  100 g    Refill:  5  . methylPREDNISolone acetate (DEPO-MEDROL) injection 40  mg  . methylPREDNISolone acetate (DEPO-MEDROL) injection 40 mg     [name], MD, PGY3 10/26/2018 5:38 PM

## 2018-10-26 ENCOUNTER — Telehealth: Payer: Self-pay | Admitting: Family Medicine

## 2018-10-26 DIAGNOSIS — M5030 Other cervical disc degeneration, unspecified cervical region: Secondary | ICD-10-CM

## 2018-10-26 NOTE — Assessment & Plan Note (Signed)
Sounds like diverticulitis is much improved after appropriate treatment at [facility]. Continue antibiotics to finish course.

## 2018-10-26 NOTE — Assessment & Plan Note (Signed)
Will repeat xr given continued pain, discussed that surgery may not be a perfect fix if she elects to see the surgeon. Will decide based on xr findings.

## 2018-10-26 NOTE — Patient Instructions (Signed)
Patient declined avs

## 2018-10-26 NOTE — Telephone Encounter (Signed)
CXR unchanged. Called patient - she would like to see ortho to consider injections again. Will place that referral.

## 2018-11-16 ENCOUNTER — Ambulatory Visit: Payer: Medicaid Other | Admitting: Physician Assistant

## 2018-11-16 ENCOUNTER — Other Ambulatory Visit (INDEPENDENT_AMBULATORY_CARE_PROVIDER_SITE_OTHER): Payer: Medicaid Other

## 2018-11-16 ENCOUNTER — Encounter: Payer: Self-pay | Admitting: Physician Assistant

## 2018-11-16 VITALS — BP 120/72 | HR 68 | Ht 61.0 in | Wt 129.8 lb

## 2018-11-16 DIAGNOSIS — R935 Abnormal findings on diagnostic imaging of other abdominal regions, including retroperitoneum: Secondary | ICD-10-CM

## 2018-11-16 DIAGNOSIS — K921 Melena: Secondary | ICD-10-CM

## 2018-11-16 DIAGNOSIS — R1084 Generalized abdominal pain: Secondary | ICD-10-CM

## 2018-11-16 LAB — CBC WITH DIFFERENTIAL/PLATELET
BASOS ABS: 0.1 10*3/uL (ref 0.0–0.1)
Basophils Relative: 0.8 % (ref 0.0–3.0)
EOS PCT: 1.8 % (ref 0.0–5.0)
Eosinophils Absolute: 0.1 10*3/uL (ref 0.0–0.7)
HCT: 38.6 % (ref 36.0–46.0)
HEMOGLOBIN: 13.2 g/dL (ref 12.0–15.0)
LYMPHS PCT: 36.6 % (ref 12.0–46.0)
Lymphs Abs: 3 10*3/uL (ref 0.7–4.0)
MCHC: 34.1 g/dL (ref 30.0–36.0)
MCV: 88.6 fl (ref 78.0–100.0)
MONOS PCT: 6.5 % (ref 3.0–12.0)
Monocytes Absolute: 0.5 10*3/uL (ref 0.1–1.0)
Neutro Abs: 4.4 10*3/uL (ref 1.4–7.7)
Neutrophils Relative %: 54.3 % (ref 43.0–77.0)
Platelets: 185 10*3/uL (ref 150.0–400.0)
RBC: 4.36 Mil/uL (ref 3.87–5.11)
RDW: 12.7 % (ref 11.5–15.5)
WBC: 8.1 10*3/uL (ref 4.0–10.5)

## 2018-11-16 MED ORDER — NA SULFATE-K SULFATE-MG SULF 17.5-3.13-1.6 GM/177ML PO SOLN: ORAL | 0 refills | Status: DC

## 2018-11-16 NOTE — Patient Instructions (Signed)
Your provider has requested that you go to the basement level for lab work before leaving today. Press "B" on the elevator. The lab is located at the first door on the left as you exit the elevator. You have been scheduled for a colonoscopy. Please follow written instructions given to you at your visit today.  Please pick up your prep supplies at the pharmacy within the next 1-3 days. If you use inhalers (even only as needed), please bring them with you on the day of your procedure. Your physician has requested that you go to www.[email or web address].com and enter the access code given to you at your visit today. This web site gives a general overview about your procedure. However, you should still follow specific instructions given to you by our office regarding your preparation for the procedure. Normal BMI (Body Mass Index- based on height and weight) is between 19 and 25. Your BMI today is Body mass index is 24.53 kg/m. Marland Kitchen Please consider follow up  regarding your BMI with your Primary Care Provider.

## 2018-11-16 NOTE — Progress Notes (Signed)
Chief Complaint: Abdominal pain, hematochezia  HPI:    Diana [name] is a 62 year old Caucasian female with a past medical history as listed below, previously known to Dr. Deatra Ina, who was referred to me by Bonnita Hollow, MD for a complaint of abnormal CT of the abdomen, abdominal pain and hematochezia.      04/10/2014 colonoscopy with Dr. Deatra Ina with mild diverticulosis in the proximal transverse colon and otherwise normal.  Repeat recommended in 10 years.    10/21/2018 CT of the abdomen and pelvis with long segment of circumferential bowel wall edema involving the descending colon consistent with segmental colitis, differential includes infectious colitis, inflammatory bowel disease, ischemic colitis or drug-induced colitis.  No mesenteric artery vascular compromise.    10/25/2018 office visit with PCP to discuss lower abdominal pain which is started 10/21/2018 with some rectal bleeding, approximately 2 tablespoons every hour or so.  Apparently patient got Cipro and Flagyl from the ER in [place] and had a CT scan with contrast there (as above).  Started with a rash which went away at that time was still on antibiotics.  This was assumed diverticulitis and suspected better.    Today, the patient presents to clinic accompanied by her husband and explains that on 11/21 she had severe abdominal pain which "crippled me" which lasted for about an hour.  Patient feels tells me it felt like knives were in her stomach and after this she started passing bright red blood by the tablespoon about every 20 minutes.  She proceeded to the ER as above and was given antibiotics.  Patient tells me bleeding lasted for at least 3 days.  Her abdominal pain did get better after initial severe episode, but completely went away after finishing Cipro and Flagyl.  Since then the patient tells me that she continues with a looser than normal stool and when she coughs or sneezes she "squirts a little poop".  Patient tells me this is  very dark brown but she is not sure that it is black.  This has been going on for the past couple of weeks.  Has seen no further blood recently.  This episode was associated with a lot of nausea but patient has had no further since being on antibiotics.    Social history positive for being a smoker.    Denies fever, chills, weight loss, anorexia, vomiting or symptoms that awaken her from sleep.  Past Medical History:  Diagnosis Date  . Anxiety   . Asthma   . Barrett esophagus    "don't know that I've ever been stretched" (08/17/2013)  . Choledocholithiasis with acute cholecystitis   . Chronic lower back pain   . Craniosynostosis    congenital  . DDD (degenerative disc disease)   . Family history of anesthesia complication    "PONV; both parents" (08/17/2013)  . GERD (gastroesophageal reflux disease)   . H/O hiatal hernia   . History of blood transfusion 1959-1960  . Hypercholesterolemia 05/28/2012   Has taken Crestor and Lipitor in the past. Switched to lovastatin b/c its $4.    . Hypertension   . IBS (irritable bowel syndrome)   . Migraines    "since age 34; probably twice/month" (08/17/2013)  . PONV (postoperative nausea and vomiting)   . Rheumatoid arthritis ([place])    "in my knuckles" (08/17/2013)  . Ruptured lumbar disc 1990's   L4-L5  . Shingles     Past Surgical History:  Procedure Laterality Date  . ABDOMINAL HYSTERECTOMY  1992  Total for chronic pelvic pain   . APPENDECTOMY    . BACK SURGERY  1990's   L4-L5 fusion @ [facility]  2007   Performed in [place]   . CRANIECTOMY FOR CRANIOSYNOSTOSIS  [id number]   "1st time got infected; 2nd time didn't take; fix the 3rd time" (08/17/2013)  . POSTERIOR LUMBAR FUSION  1990's   "took piece off my hip" (08/17/2013)  . TOTAL HIP ARTHROPLASTY Right 12/31/04   AVN after fall injury; performed by Ralene Cork; DePuy S-ROM total hip (metal on metal)    Current Outpatient Medications  Medication Sig  Dispense Refill  . albuterol (PROVENTIL HFA;VENTOLIN HFA) 108 (90 Base) MCG/ACT inhaler Inhale 2 puffs into the lungs every 6 (six) hours as needed for wheezing. 8.5 g 5  . diclofenac sodium (VOLTAREN) 1 % GEL Apply 2 g topically 4 (four) times daily as needed. 100 g 5  . amLODipine (NORVASC) 5 MG tablet Take 1 tablet (5 mg total) by mouth daily. 90 tablet 3  . CALCIUM-MAGNESIUM-ZINC PO Take 1 tablet by mouth daily.     . Coenzyme Q10 (CO Q-10 PO) Take 1 capsule by mouth daily.     Marland Kitchen conjugated estrogens (PREMARIN) vaginal cream Place 1 Applicatorful vaginally daily as needed (for hormone imbalance).     . Desloratadine-Pseudoephedrine (CLARINEX-D 24 HOUR PO) Take 1 tablet by mouth daily as needed (allergies).    . Ergocalciferol (VITAMIN D2) 2000 UNITS TABS Take 2,000 Units by mouth daily.    Marland Kitchen esomeprazole (NEXIUM) 40 MG capsule Take 1 capsule (40 mg total) by mouth daily at 12 noon. 90 capsule 1  . fluvastatin (LESCOL) 20 MG capsule Take 20 mg by mouth at bedtime.    Marland Kitchen LIVALO 1 MG TABS Take 1 tablet by mouth at bedtime.  0  . meloxicam (MOBIC) 15 MG tablet Take 15 mg by mouth daily.    . Multiple Vitamin (MULITIVITAMIN WITH MINERALS) TABS Take 1 tablet by mouth daily.    . naproxen (NAPROSYN) 500 MG tablet Take 1 tablet (500 mg total) by mouth 2 (two) times daily with a meal. 10 tablet 0  . predniSONE (DELTASONE) 50 MG tablet Take 50 mg with breakfast daily. 5 tablet 0  . propranolol (INDERAL) 40 MG tablet Take 1 tablet (40 mg total) by mouth 3 (three) times daily. 90 tablet 3  . Tiotropium Bromide-Olodaterol (STIOLTO RESPIMAT) 2.5-2.5 MCG/ACT AERS Inhale 2 puffs into the lungs daily. 2 Inhaler 3  . TURMERIC PO Take 1 tablet by mouth daily.     . varenicline (CHANTIX PAK) 0.5 MG X 11 & 1 MG X 42 tablet Take one 0.5 mg tablet once daily for 3 days. Increase to one 0.5 mg tablet twice daily for 4 days. Increase to one 1 mg tablet twice daily. 53 tablet 0   No current facility-administered  medications for this visit.     Allergies as of 11/16/2018 - Review Complete 11/16/2018  Allergen Reaction Noted  . Penicillins Anaphylaxis 08/26/2011  . Demerol [meperidine] Nausea And Vomiting 04/27/2012  . Doxycycline hyclate Nausea And Vomiting 01/25/2013  . Gabapentin Other (See Comments) 06/30/2012  . Sulfa antibiotics Hives 08/26/2011  . Levaquin [levofloxacin]  05/17/2018  . Benadryl [diphenhydramine hcl] Rash 04/27/2012  . Buprenorphine hcl Nausea And Vomiting 04/11/2015  . Cephalexin Rash 06/07/2012  . Morphine and related Nausea And Vomiting 08/26/2011  . Spiriva handihaler [tiotropium bromide monohydrate] Rash 02/24/2017    Family History  Problem Relation Age of  Onset  . Hypertension Mother   . Hypertension Father   . Skin cancer Father   . Colon cancer Paternal Grandfather     Social History   Socioeconomic History  . Marital status: Divorced    Spouse name: Not on file  . Number of children: Not on file  . Years of education: Not on file  . Highest education level: Not on file  Occupational History  . Not on file  Social Needs  . Financial resource strain: Not on file  . Food insecurity:    Worry: Not on file    Inability: Not on file  . Transportation needs:    Medical: Not on file    Non-medical: Not on file  Tobacco Use  . Smoking status: Current Every Day Smoker    Packs/day: 1.00    Years: 32.00    Pack years: 32.00    Types: Cigarettes  . Smokeless tobacco: Never Used  Substance and Sexual Activity  . Alcohol use: Yes    Alcohol/week: 0.0 standard drinks    Comment: 08/17/2013 "couple mixed drinks/wk"  . Drug use: No  . Sexual activity: Not Currently  Lifestyle  . Physical activity:    Days per week: Not on file    Minutes per session: Not on file  . Stress: Not on file  Relationships  . Social connections:    Talks on phone: Not on file    Gets together: Not on file    Attends religious service: Not on file    Active member of  club or organization: Not on file    Attends meetings of clubs or organizations: Not on file    Relationship status: Not on file  . Intimate partner violence:    Fear of current or ex partner: Not on file    Emotionally abused: Not on file    Physically abused: Not on file    Forced sexual activity: Not on file  Other Topics Concern  . Not on file  Social History Narrative  . Not on file    Review of Systems:    Constitutional: No weight loss, fever or chills Skin: No rash Cardiovascular: No chest pain Respiratory: No SOB  Gastrointestinal: See HPI and otherwise negative Genitourinary: No dysuria  Neurological: No headache, dizziness or syncope Musculoskeletal: No new muscle or joint pain Hematologic: No bruising Psychiatric: No history of depression or anxiety   Physical Exam:  Vital signs: BP 120/72   Pulse 68   Ht 5\' 1"  (1.549 m)   Wt 129 lb 12.8 oz (58.9 kg)   BMI 24.53 kg/m   Constitutional:   Pleasant Caucasian female appears to be in NAD, Well developed, Well nourished, alert and cooperative Head:  Normocephalic and atraumatic. Eyes:   PEERL, EOMI. No icterus. Conjunctiva pink. Ears:  Normal auditory acuity. Neck:  Supple Throat: Oral cavity and pharynx without inflammation, swelling or lesion.  Respiratory: Respirations even and unlabored. Lungs clear to auscultation bilaterally.   No wheezes, crackles, or rhonchi.  Cardiovascular: Normal S1, S2. No MRG. Regular rate and rhythm. No peripheral edema, cyanosis or pallor.  Gastrointestinal:  Soft, nondistended, nontender. No rebound or guarding. Normal bowel sounds. No appreciable masses or hepatomegaly. Rectal:  Not performed.  Msk:  Symmetrical without gross deformities. Without edema, no deformity or joint abnormality.  Neurologic:  Alert and  oriented x4;  grossly normal neurologically.  Skin:   Dry and intact without significant lesions or rashes. Psychiatric: Demonstrates good judgement and  reason without  abnormal affect or behaviors.  SEE HPI FOR RECENT IMAGING.  Assessment: 1.  Hematochezia: For 3 to 4 days, at least 1 to 2 tablespoons every 20 minutes per the patient, preceded by severe abdominal pain, CT with segmental colitis, finished round of Cipro and Flagyl about 4 weeks ago; most likely ischemic colitis given preceding pain and patient's history of tobacco use 2.  Abdominal pain: See above 3.  Abnormal CT of the abdomen and pelvis: See above but with no recent colonoscopy must also consider IBD  Plan: 1.  Ordered repeat CBC today 2.  Scheduled patient for colonoscopy in the [place] with Dr. Loletha Carrow given recent abnormal CT of the abdomen and hematochezia.  Did discuss risk, benefits, limitations and alternatives and the patient agrees to proceed. 3.  Discussed pathophysiology of ischemic colitis with the patient today. 4.  Patient to follow in clinic per recommendations from Dr. Loletha Carrow after time of procedure.  Diana Newer, PA-C [facility] 11/16/2018, 9:49 AM  Cc: Bonnita Hollow, MD

## 2018-11-16 NOTE — Progress Notes (Signed)
Thank you for sending this case to me. I have reviewed the entire note, and the outlined plan seems appropriate.  Agreed this is very convincing for an episode of ischemic colitis. Will be healed enough by now for colonoscopy later this week. Must stop smoking.  Wilfrid Lund, MD

## 2018-11-17 DIAGNOSIS — M5412 Radiculopathy, cervical region: Secondary | ICD-10-CM | POA: Diagnosis not present

## 2018-11-18 ENCOUNTER — Telehealth: Payer: Self-pay | Admitting: Gastroenterology

## 2018-11-18 NOTE — Telephone Encounter (Signed)
Pt is being started on a steroid dose pack, wanted to confirm okay to proceed with procedure Friday 11/19/18. Advised yes.

## 2018-11-19 ENCOUNTER — Encounter: Payer: Self-pay | Admitting: Gastroenterology

## 2018-11-19 ENCOUNTER — Ambulatory Visit (AMBULATORY_SURGERY_CENTER): Payer: Medicaid Other | Admitting: Gastroenterology

## 2018-11-19 VITALS — BP 114/54 | HR 72 | Temp 97.1°F | Resp 22 | Ht 61.0 in | Wt 129.0 lb

## 2018-11-19 DIAGNOSIS — K921 Melena: Secondary | ICD-10-CM

## 2018-11-19 DIAGNOSIS — R933 Abnormal findings on diagnostic imaging of other parts of digestive tract: Secondary | ICD-10-CM

## 2018-11-19 DIAGNOSIS — K5730 Diverticulosis of large intestine without perforation or abscess without bleeding: Secondary | ICD-10-CM | POA: Diagnosis not present

## 2018-11-19 DIAGNOSIS — D125 Benign neoplasm of sigmoid colon: Secondary | ICD-10-CM | POA: Diagnosis not present

## 2018-11-19 DIAGNOSIS — K5909 Other constipation: Secondary | ICD-10-CM

## 2018-11-19 DIAGNOSIS — R935 Abnormal findings on diagnostic imaging of other abdominal regions, including retroperitoneum: Secondary | ICD-10-CM | POA: Diagnosis not present

## 2018-11-19 DIAGNOSIS — D122 Benign neoplasm of ascending colon: Secondary | ICD-10-CM

## 2018-11-19 MED ORDER — SODIUM CHLORIDE 0.9 % IV SOLN: 500.0000 mL | Freq: Once | INTRAVENOUS | Status: DC

## 2018-11-19 NOTE — Progress Notes (Signed)
Alert and oriented x3, pleased with MAC, report to RN  

## 2018-11-19 NOTE — Progress Notes (Signed)
Called to room to assist during endoscopic procedure.  Patient ID and intended procedure confirmed with present staff. Received instructions for my participation in the procedure from the performing physician.  

## 2018-11-19 NOTE — Op Note (Signed)
[facility] Patient Name: Diana [name] Procedure Date: 11/19/2018 12:16 PM MRN: [id number] Endoscopist: Mallie Mussel L. Loletha Carrow , MD Age: 62 Referring MD:  Date of Birth: [birth year] Gender: Female Account #: [id number] Procedure:                Colonoscopy Indications:              Hematochezia, , Abnormal CT of the GI tract,                            Constipation (episode last month of acute-onset                            abdominal pain , hematochezia and CT scan showing                            acute inflammation splenic flexure) Medicines:                Monitored Anesthesia Care Procedure:                Pre-Anesthesia Assessment:                           - Prior to the procedure, a History and Physical                            was performed, and patient medications and                            allergies were reviewed. The patient's tolerance of                            previous anesthesia was also reviewed. The risks                            and benefits of the procedure and the sedation                            options and risks were discussed with the patient.                            All questions were answered, and informed consent                            was obtained. Anticoagulants: The patient has taken                            aspirin. It was decided not to withhold this                            medication prior to the procedure. ASA Grade                            Assessment: III - A patient with severe systemic  disease. After reviewing the risks and benefits,                            the patient was deemed in satisfactory condition to                            undergo the procedure.                           After obtaining informed consent, the colonoscope                            was passed under direct vision. Throughout the                            procedure, the patient's blood pressure, pulse, and                       oxygen saturations were monitored continuously. The                            Colonoscope was introduced through the anus and                            advanced to the the cecum, identified by                            appendiceal orifice and ileocecal valve. The                            colonoscopy was somewhat difficult due to multiple                            diverticula in the colon and a tortuous colon.                            Successful completion of the procedure was aided by                            using manual pressure. The patient tolerated the                            procedure well. The quality of the bowel                            preparation was good. The ileocecal valve,                            appendiceal orifice, and rectum were photographed.                            The quality of the bowel preparation was evaluated  using the BBPS [facility] Bowel Preparation Scale)                            with scores of: Right Colon = 2, Transverse Colon =                            2 and Left Colon = 2. The total BBPS score equals 6. Scope In: 12:27:42 PM Scope Out: 12:45:46 PM Scope Withdrawal Time: 0 hours 10 minutes 8 seconds  Total Procedure Duration: 0 hours 18 minutes 4 seconds  Findings:                 The perianal and digital rectal examinations were                            normal.                           Many diverticula were found in the sigmoid colon.                            There was associated tortuosity, making scope                            passage very challenging.                           Two sessile polyps were found in the ascending                            colon. The polyps were 4 to 5 mm in size. These                            polyps were removed with a cold snare. Resection                            and retrieval were complete. (Jar 1)                           Two sessile polyps  were found in the proximal                            sigmoid colon. The polyps were 2 to 3 mm in size.                            These polyps were removed with a cold snare.                            Resection and retrieval were complete. (Jar 1)                           The exam was otherwise without abnormality on  direct and retroflexion views. Complications:            No immediate complications. Estimated Blood Loss:     Estimated blood loss was minimal. Impression:               - Diverticulosis in the sigmoid colon.                           - Two 4 to 5 mm polyps in the ascending colon,                            removed with a cold snare. Resected and retrieved.                           - Two 2 to 3 mm polyps in the proximal sigmoid                            colon, removed with a cold snare. Resected and                            retrieved.                           - The examination was otherwise normal on direct                            and retroflexion views.                           Probable recent episode of ischemic colitis, now                            healed. Smoking biggest risk factor. Chronic                            constipation may be contributing as well. Recommendation:           - Patient has a contact number available for                            emergencies. The signs and symptoms of potential                            delayed complications were discussed with the                            patient. Return to normal activities tomorrow.                            Written discharge instructions were provided to the                            patient.                           - Resume previous diet.                           -  Continue present medications.                           - Await pathology results.                           - Stop smoking.                           - Repeat colonoscopy is recommended for                             surveillance. The colonoscopy date will be                            determined after pathology results from today's                            exam become available for review.                           - Return to my office at appointment to be                            scheduled to discuss chronic constipation. Evonda Enge L. Loletha Carrow, MD 11/19/2018 12:53:40 PM This report has been signed electronically.

## 2018-11-19 NOTE — Patient Instructions (Signed)
Dr.Danis recommends to stop smoking   YOU HAD AN ENDOSCOPIC PROCEDURE TODAY AT [facility]:   Refer to the procedure report that was given to you for any specific questions about what was found during the examination.  If the procedure report does not answer your questions, please call your gastroenterologist to clarify.  If you requested that your care partner not be given the details of your procedure findings, then the procedure report has been included in a sealed envelope for you to review at your convenience later.  YOU SHOULD EXPECT: Some feelings of bloating in the abdomen. Passage of more gas than usual.  Walking can help get rid of the air that was put into your GI tract during the procedure and reduce the bloating. If you had a lower endoscopy (such as a colonoscopy or flexible sigmoidoscopy) you may notice spotting of blood in your stool or on the toilet paper. If you underwent a bowel prep for your procedure, you may not have a normal bowel movement for a few days.  Please Note:  You might notice some irritation and congestion in your nose or some drainage.  This is from the oxygen used during your procedure.  There is no need for concern and it should clear up in a day or so.  SYMPTOMS TO REPORT IMMEDIATELY:   Following lower endoscopy (colonoscopy or flexible sigmoidoscopy):  Excessive amounts of blood in the stool  Significant tenderness or worsening of abdominal pains  Swelling of the abdomen that is new, acute  Fever of 100F or higher   For urgent or emergent issues, a gastroenterologist can be reached at any hour by calling [phone].   DIET:  We do recommend a small meal at first, but then you may proceed to your regular diet.  Drink plenty of fluids but you should avoid alcoholic beverages for 24 hours.  ACTIVITY:  You should plan to take it easy for the rest of today and you should NOT DRIVE or use heavy machinery until tomorrow (because of the  sedation medicines used during the test).    FOLLOW UP: Our staff will call the number listed on your records the next business day following your procedure to check on you and address any questions or concerns that you may have regarding the information given to you following your procedure. If we do not reach you, we will leave a message.  However, if you are feeling well and you are not experiencing any problems, there is no need to return our call.  We will assume that you have returned to your regular daily activities without incident.  If any biopsies were taken you will be contacted by phone or by letter within the next 1-3 weeks.  Please call us at [phone] if you have not heard about the biopsies in 3 weeks.    SIGNATURES/CONFIDENTIALITY: You and/or your care partner have signed paperwork which will be entered into your electronic medical record.  These signatures attest to the fact that that the information above on your After Visit Summary has been reviewed and is understood.  Full responsibility of the confidentiality of this discharge information lies with you and/or your care-partner.

## 2018-11-19 NOTE — Progress Notes (Signed)
Pt's states no medical or surgical changes since previsit or office visit. 

## 2018-11-22 ENCOUNTER — Telehealth: Payer: Self-pay

## 2018-11-22 NOTE — Telephone Encounter (Signed)
  Follow up Call-  Call back number 11/19/2018  Post procedure Call Back phone  # [phone]  Permission to leave phone message Yes  Some recent data might be hidden     Patient questions:  Do you have a fever, pain , or abdominal swelling? N Pain Score  0  Have you tolerated food without any problems? Y  Have you been able to return to your normal activities? Y  Do you have any questions about your discharge instructions: Diet   n Medications  n Follow up visit  n  Do you have questions or concerns about your Care? n  Actions: * If pain score is 4 or above: No action needed, pain <4

## 2018-11-25 DIAGNOSIS — M50222 Other cervical disc displacement at C5-C6 level: Secondary | ICD-10-CM | POA: Diagnosis not present

## 2018-11-25 DIAGNOSIS — M50223 Other cervical disc displacement at C6-C7 level: Secondary | ICD-10-CM | POA: Diagnosis not present

## 2018-11-25 DIAGNOSIS — M5412 Radiculopathy, cervical region: Secondary | ICD-10-CM | POA: Diagnosis not present

## 2018-12-02 ENCOUNTER — Encounter: Payer: Self-pay | Admitting: Gastroenterology

## 2018-12-08 DIAGNOSIS — M5412 Radiculopathy, cervical region: Secondary | ICD-10-CM | POA: Diagnosis not present

## 2018-12-23 NOTE — Progress Notes (Signed)
     [facility] Progress Note  Chief Complaint: Chronic constipation  Subjective  History:  Chronic constipation and recent episode of probable ischemic colitis which had resolved on colonoscopy last month.  Several small tubular adenomas were removed as well.  He has been troubled by severe chronic constipation for years.  For whatever reason, her constipation seems to have resolved.  She is having 1 or 2 semi-formed, and occasionally loose, stools per day.  There is sometimes a small amount of fecal incontinence if she coughs or sneezes.  There is no rectal bleeding, no abdominal pain, her appetite is good and weight stable.  ROS: Cardiovascular:  no chest pain Respiratory: no dyspnea  The patient's Past Medical, Family and Social History were reviewed and are on file in the EMR.  Objective:  Med list reviewed  Current Outpatient Medications:  .  albuterol (PROVENTIL HFA;VENTOLIN HFA) 108 (90 Base) MCG/ACT inhaler, Inhale 2 puffs into the lungs every 6 (six) hours as needed for wheezing., Disp: 8.5 g, Rfl: 5 .  amLODipine (NORVASC) 5 MG tablet, Take 1 tablet (5 mg total) by mouth daily., Disp: 90 tablet, Rfl: 3 .  diclofenac sodium (VOLTAREN) 1 % GEL, Apply 2 g topically 4 (four) times daily as needed., Disp: 100 g, Rfl: 5 .  esomeprazole (NEXIUM) 40 MG capsule, Take 1 capsule (40 mg total) by mouth daily at 12 noon., Disp: 90 capsule, Rfl: 1 .  LIVALO 1 MG TABS, Take 1 tablet by mouth at bedtime., Disp: , Rfl: 0 .  propranolol (INDERAL) 40 MG tablet, Take 1 tablet (40 mg total) by mouth 3 (three) times daily., Disp: 90 tablet, Rfl: 3 .  Tiotropium Bromide-Olodaterol (STIOLTO RESPIMAT) 2.5-2.5 MCG/ACT AERS, Inhale 2 puffs into the lungs daily., Disp: 2 Inhaler, Rfl: 3 .  varenicline (CHANTIX PAK) 0.5 MG X 11 & 1 MG X 42 tablet, Take by mouth 2 (two) times daily. Take one 0.5 mg tablet by mouth once daily for 3 days, then increase to one 0.5 mg tablet twice daily for 4 days,  then increase to one 1 mg tablet twice daily., Disp: , Rfl:    Vital signs in last 24 hrs: Vitals:   12/24/18 1607  BP: 110/72  Pulse: 78    Physical Exam  Accompanied by her husband today  HEENT: sclera anicteric, oral mucosa moist without lesions  Neck: supple, no thyromegaly, JVD or lymphadenopathy  Cardiac: RRR without murmurs, S1S2 heard, no peripheral edema  Pulm: clear to auscultation bilaterally, normal RR and effort noted  Abdomen: soft, no tenderness, with active bowel sounds. No guarding or palpable hepatosplenomegaly.  Skin; warm and dry, no jaundice or rash  Recent Labs:  Pathology reports as noted above   @ASSESSMENTPLANBEGIN @ Assessment: Encounter Diagnoses  Name Primary?  . Chronic constipation Yes  . Ischemic colitis ([place])    She had years of severe constipation that now seems better for unclear reasons.  Her stool has become a little more loose and frequent.  I recommended a daily fiber supplement, definitely do not want her taking antidiarrheal medicines.  If constipation recurs like before, she will contact me.  Otherwise she will have a follow-up colonoscopy as recommended for adenomatous polyps.  Her ischemic colitis completely resolved as evidenced by recent colonoscopy.  Total time 20 minutes, over half spent face-to-face with patient in counseling and coordination of care.   Nelida Meuse III

## 2018-12-24 ENCOUNTER — Encounter: Payer: Self-pay | Admitting: Gastroenterology

## 2018-12-24 ENCOUNTER — Ambulatory Visit: Payer: Medicaid Other | Admitting: Gastroenterology

## 2018-12-24 VITALS — BP 110/72 | HR 78 | Ht 61.0 in | Wt 128.0 lb

## 2018-12-24 DIAGNOSIS — K559 Vascular disorder of intestine, unspecified: Secondary | ICD-10-CM | POA: Diagnosis not present

## 2018-12-24 DIAGNOSIS — K5909 Other constipation: Secondary | ICD-10-CM

## 2018-12-24 NOTE — Patient Instructions (Signed)
If you are age 61 or older, your body mass index should be between 23-30. Your Body mass index is 24.19 kg/m. If this is out of the aforementioned range listed, please consider follow up with your Primary Care Provider.  If you are age 61 or younger, your body mass index should be between 19-25. Your Body mass index is 24.19 kg/m. If this is out of the aformentioned range listed, please consider follow up with your Primary Care Provider.   Follow up as needed.  It was a pleasure to see you today!  Dr. Loletha Carrow

## 2018-12-25 DIAGNOSIS — H5711 Ocular pain, right eye: Secondary | ICD-10-CM | POA: Diagnosis not present

## 2018-12-27 DIAGNOSIS — H43813 Vitreous degeneration, bilateral: Secondary | ICD-10-CM | POA: Diagnosis not present

## 2018-12-31 DIAGNOSIS — M50123 Cervical disc disorder at C6-C7 level with radiculopathy: Secondary | ICD-10-CM | POA: Diagnosis not present

## 2018-12-31 DIAGNOSIS — M50122 Cervical disc disorder at C5-C6 level with radiculopathy: Secondary | ICD-10-CM | POA: Diagnosis not present

## 2019-01-27 ENCOUNTER — Other Ambulatory Visit: Payer: Self-pay

## 2019-01-27 NOTE — Telephone Encounter (Signed)
Pt called nurse line to have her propranolol refilled. Per last request she should have enough refills. I called patient to clarify, and she stated she does.

## 2019-02-07 ENCOUNTER — Other Ambulatory Visit: Payer: Self-pay

## 2019-02-07 ENCOUNTER — Ambulatory Visit (INDEPENDENT_AMBULATORY_CARE_PROVIDER_SITE_OTHER): Payer: Medicaid Other | Admitting: Family Medicine

## 2019-02-07 ENCOUNTER — Encounter: Payer: Self-pay | Admitting: Family Medicine

## 2019-02-07 VITALS — BP 122/64 | HR 73 | Temp 98.6°F | Ht 61.0 in | Wt 125.4 lb

## 2019-02-07 DIAGNOSIS — Q750 Craniosynostosis: Secondary | ICD-10-CM

## 2019-02-07 DIAGNOSIS — R413 Other amnesia: Secondary | ICD-10-CM

## 2019-02-07 DIAGNOSIS — J449 Chronic obstructive pulmonary disease, unspecified: Secondary | ICD-10-CM | POA: Diagnosis not present

## 2019-02-07 DIAGNOSIS — F17209 Nicotine dependence, unspecified, with unspecified nicotine-induced disorders: Secondary | ICD-10-CM

## 2019-02-07 MED ORDER — FLUCONAZOLE 150 MG PO TABS: 150.0000 mg | ORAL_TABLET | Freq: Once | ORAL | 0 refills | Status: AC

## 2019-02-07 NOTE — Patient Instructions (Signed)
It was a pleasure to see you today! Thank you for choosing [facility] for your primary care. Diana [name] was seen for COPD, memory concerns.   Our plans for today were:  For your memory concerns, we will get some lab work today. Please make an appointment in our geriatric clinic for further evaluation.   For your COPD, I have placed a referral to the lung doctor. Stopping smoking is the best thing you could do for your lungs and breathing. Good work on working toward that goal.   I sent the yeast infection medicine.   Best,  Dr. Lindell Noe

## 2019-02-07 NOTE — Progress Notes (Signed)
CC: COPD, yeast infection, memory  HPI  Has a nurse coordinator now due to ED visits for COPD, this nurse is concerned that she might need to see pulm.  COPD - stiolto, taking as rx'd. Needs refill. Using albuterol about once a week. Has to use it when she feels like she can't talk with coughing fits. Smoking 4-6 cigarettes per day, decreased since 2 months ago. Felt like the chantix helped. Feels like she has had 5 flares over the last month, no particular trigger. Used albuterol 3 times in one day.   Thinks she had a yeast infection, came up a couple of weeks ago. No creams tried. Tried probiotics. No recent abx.   Feels like her memory is getting worse- family has noticed that she doesn't recall what they tell her. [place] and hit her head on a [employer] machine door 1 month ago and had a headache for about 3 days. Cant remember when she took her medicines. Thinks this has been months. She tried a pill box and still forgets.  She can't recall things people have told her a day or two prior. She doesn't feel mood is a concern right now. 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   ROS: Denies CP, SOB, abdominal pain, dysuria, changes in BMs.   CC, SH/smoking status, and VS noted  Objective: BP 122/64   Pulse 73   Temp 98.6 F (37 C) (Oral)   Ht '5\' 1"'  (1.549 m)   Wt 125 lb 6.4 oz (56.9 kg)   SpO2 98%   BMI 23.69 kg/m  Gen: NAD, alert, cooperative, and pleasant. HEENT: NCAT, EOMI, PERRL CV: RRR, no murmur Resp: CTAB, no wheezes, non-labored Ext: No edema, warm Neuro: Alert and oriented, Speech clear, No gross deficits  Assessment and plan:  Memory change Patient reports familial concern with decreased memory, she also reports that she frequently forgets to take medicines despite using a pillbox.  Her last head imaging was an MRI brain in 2016 which was normal.  We will obtain the lab work for dementia including TSH, CBC, CMP, HIV, RPR.  I have low  suspicion for organic cause of this.  Also conducted mood screening which showed a PHQ-9 of 6 which the patient attributes more to trouble sleeping.  Gad 7 was negative and MDQ negative.  She has been a smoker for quite some time so there is a possibility of occult vascular disease. Asked her to make a GERI appt for next available for a full memory assessment.   Tobacco use disorder, continuous Patient has decrease her cigarettes to 4 to 6/day, which is an improvement.  This is obviously the most pressing concern with her breathing.  Chronic obstructive pulmonary disease ([place]) Patient is on a Labe lama combination and using it appropriately.  She continues to smoke which is my primary concern with her lungs.  She has spirometry from 2015 in the chart.  She wonders whether she should see the pulmonologist as well.  I am happy to place this referral.  I counseled her that they may consider ulnar rehab which would be helpful for her.  Continue albuterol as needed.  We could also trial a Lama laba ICS if she is not able to make it to the pulmonology appointment.  Yeast vaginitis: presumed based on sxs. Treat with diflucan and f/u if no improvement.   Orders Placed This Encounter  Procedures  . CBC with Differential/Platelet  .  TSH  . RPR  . HIV Antibody (routine testing w rflx)  . CMP14+EGFR  . Ambulatory referral to Pulmonology    Referral Priority:   Routine    Referral Type:   Consultation    Referral Reason:   Specialty Services Required    Requested Specialty:   Pulmonary Disease    Number of Visits Requested:   1    Meds ordered this encounter  Medications  . fluconazole (DIFLUCAN) 150 MG tablet    Sig: Take 1 tablet (150 mg total) by mouth once for 1 dose.    Dispense:  1 tablet    Refill:  0    [name], MD, PGY3 02/07/2019 4:52 PM

## 2019-02-07 NOTE — Assessment & Plan Note (Signed)
Patient reports familial concern with decreased memory, she also reports that she frequently forgets to take medicines despite using a pillbox.  Her last head imaging was an MRI brain in 2016 which was normal.  We will obtain the lab work for dementia including TSH, CBC, CMP, HIV, RPR.  I have low suspicion for organic cause of this.  Also conducted mood screening which showed a PHQ-9 of 6 which the patient attributes more to trouble sleeping.  Gad 7 was negative and MDQ negative.  She has been a smoker for quite some time so there is a possibility of occult vascular disease. Asked her to make a GERI appt for next available for a full memory assessment.

## 2019-02-07 NOTE — Assessment & Plan Note (Signed)
Patient is on a Labe lama combination and using it appropriately.  She continues to smoke which is my primary concern with her lungs.  She has spirometry from 2015 in the chart.  She wonders whether she should see the pulmonologist as well.  I am happy to place this referral.  I counseled her that they may consider ulnar rehab which would be helpful for her.  Continue albuterol as needed.  We could also trial a Lama laba ICS if she is not able to make it to the pulmonology appointment.

## 2019-02-07 NOTE — Assessment & Plan Note (Signed)
Patient has decrease her cigarettes to 4 to 6/day, which is an improvement.  This is obviously the most pressing concern with her breathing.

## 2019-02-08 ENCOUNTER — Telehealth: Payer: Self-pay

## 2019-02-08 LAB — CBC WITH DIFFERENTIAL/PLATELET
Basophils Absolute: 0.1 10*3/uL (ref 0.0–0.2)
Basos: 1 %
EOS (ABSOLUTE): 0.1 10*3/uL (ref 0.0–0.4)
Eos: 2 %
Hematocrit: 44 % (ref 34.0–46.6)
Hemoglobin: 14.5 g/dL (ref 11.1–15.9)
IMMATURE GRANULOCYTES: 0 %
Immature Grans (Abs): 0 10*3/uL (ref 0.0–0.1)
Lymphocytes Absolute: 3.2 10*3/uL — ABNORMAL HIGH (ref 0.7–3.1)
Lymphs: 38 %
MCH: 30 pg (ref 26.6–33.0)
MCHC: 33 g/dL (ref 31.5–35.7)
MCV: 91 fL (ref 79–97)
MONOS ABS: 0.4 10*3/uL (ref 0.1–0.9)
Monocytes: 5 %
Neutrophils Absolute: 4.5 10*3/uL (ref 1.4–7.0)
Neutrophils: 54 %
Platelets: 228 10*3/uL (ref 150–450)
RBC: 4.84 x10E6/uL (ref 3.77–5.28)
RDW: 12.2 % (ref 11.7–15.4)
WBC: 8.3 10*3/uL (ref 3.4–10.8)

## 2019-02-08 LAB — CMP14+EGFR
ALT: 13 IU/L (ref 0–32)
AST: 18 IU/L (ref 0–40)
Albumin/Globulin Ratio: 2.5 — ABNORMAL HIGH (ref 1.2–2.2)
Albumin: 4.9 g/dL (ref 3.8–4.9)
Alkaline Phosphatase: 85 IU/L (ref 39–117)
BUN/Creatinine Ratio: 4 — ABNORMAL LOW (ref 12–28)
BUN: 4 mg/dL — ABNORMAL LOW (ref 8–27)
Bilirubin Total: 0.5 mg/dL (ref 0.0–1.2)
CO2: 25 mmol/L (ref 20–29)
Calcium: 10 mg/dL (ref 8.7–10.3)
Chloride: 97 mmol/L (ref 96–106)
Creatinine, Ser: 1.07 mg/dL — ABNORMAL HIGH (ref 0.57–1.00)
GFR calc Af Amer: 65 mL/min/{1.73_m2} (ref >59)
GFR calc non Af Amer: 57 mL/min/{1.73_m2} — ABNORMAL LOW (ref >59)
Globulin, Total: 2 g/dL (ref 1.5–4.5)
Glucose: 93 mg/dL (ref 65–99)
POTASSIUM: 4.3 mmol/L (ref 3.5–5.2)
Sodium: 137 mmol/L (ref 134–144)
Total Protein: 6.9 g/dL (ref 6.0–8.5)

## 2019-02-08 LAB — RPR: RPR: NONREACTIVE

## 2019-02-08 LAB — TSH: TSH: 1.65 u[IU]/mL (ref 0.450–4.500)

## 2019-02-08 LAB — HIV ANTIBODY (ROUTINE TESTING W REFLEX): HIV SCREEN 4TH GENERATION: NONREACTIVE

## 2019-02-08 NOTE — Telephone Encounter (Signed)
Pt informed and confirmed [facility], date and time. Ottis Stain, CMA

## 2019-02-08 NOTE — Telephone Encounter (Signed)
-----   Message from Sela Hilding, MD sent at 02/08/2019  1:29 PM EDT ----- [facility], please let patient know that her labs are essentially normal and do not show any cause for her memory troubles, as we expected. She should keep her geri clinic appt.

## 2019-02-18 DIAGNOSIS — M47812 Spondylosis without myelopathy or radiculopathy, cervical region: Secondary | ICD-10-CM | POA: Diagnosis not present

## 2019-02-18 DIAGNOSIS — M9902 Segmental and somatic dysfunction of thoracic region: Secondary | ICD-10-CM | POA: Diagnosis not present

## 2019-02-18 DIAGNOSIS — M5414 Radiculopathy, thoracic region: Secondary | ICD-10-CM | POA: Diagnosis not present

## 2019-02-18 DIAGNOSIS — M9904 Segmental and somatic dysfunction of sacral region: Secondary | ICD-10-CM | POA: Diagnosis not present

## 2019-02-18 DIAGNOSIS — M5412 Radiculopathy, cervical region: Secondary | ICD-10-CM | POA: Diagnosis not present

## 2019-02-18 DIAGNOSIS — M5416 Radiculopathy, lumbar region: Secondary | ICD-10-CM | POA: Diagnosis not present

## 2019-02-18 DIAGNOSIS — M9903 Segmental and somatic dysfunction of lumbar region: Secondary | ICD-10-CM | POA: Diagnosis not present

## 2019-03-03 ENCOUNTER — Ambulatory Visit: Payer: Medicaid Other

## 2019-03-14 DIAGNOSIS — M5416 Radiculopathy, lumbar region: Secondary | ICD-10-CM | POA: Diagnosis not present

## 2019-03-14 DIAGNOSIS — M9904 Segmental and somatic dysfunction of sacral region: Secondary | ICD-10-CM | POA: Diagnosis not present

## 2019-03-14 DIAGNOSIS — M5414 Radiculopathy, thoracic region: Secondary | ICD-10-CM | POA: Diagnosis not present

## 2019-03-14 DIAGNOSIS — M9903 Segmental and somatic dysfunction of lumbar region: Secondary | ICD-10-CM | POA: Diagnosis not present

## 2019-03-14 DIAGNOSIS — M5412 Radiculopathy, cervical region: Secondary | ICD-10-CM | POA: Diagnosis not present

## 2019-03-14 DIAGNOSIS — M9902 Segmental and somatic dysfunction of thoracic region: Secondary | ICD-10-CM | POA: Diagnosis not present

## 2019-03-18 DIAGNOSIS — M5416 Radiculopathy, lumbar region: Secondary | ICD-10-CM | POA: Diagnosis not present

## 2019-03-18 DIAGNOSIS — M9902 Segmental and somatic dysfunction of thoracic region: Secondary | ICD-10-CM | POA: Diagnosis not present

## 2019-03-18 DIAGNOSIS — M9903 Segmental and somatic dysfunction of lumbar region: Secondary | ICD-10-CM | POA: Diagnosis not present

## 2019-03-18 DIAGNOSIS — M5412 Radiculopathy, cervical region: Secondary | ICD-10-CM | POA: Diagnosis not present

## 2019-03-18 DIAGNOSIS — M5414 Radiculopathy, thoracic region: Secondary | ICD-10-CM | POA: Diagnosis not present

## 2019-03-18 DIAGNOSIS — M9904 Segmental and somatic dysfunction of sacral region: Secondary | ICD-10-CM | POA: Diagnosis not present

## 2019-03-21 DIAGNOSIS — M9902 Segmental and somatic dysfunction of thoracic region: Secondary | ICD-10-CM | POA: Diagnosis not present

## 2019-03-21 DIAGNOSIS — M5412 Radiculopathy, cervical region: Secondary | ICD-10-CM | POA: Diagnosis not present

## 2019-03-21 DIAGNOSIS — M5416 Radiculopathy, lumbar region: Secondary | ICD-10-CM | POA: Diagnosis not present

## 2019-03-21 DIAGNOSIS — M9903 Segmental and somatic dysfunction of lumbar region: Secondary | ICD-10-CM | POA: Diagnosis not present

## 2019-03-21 DIAGNOSIS — M5414 Radiculopathy, thoracic region: Secondary | ICD-10-CM | POA: Diagnosis not present

## 2019-03-21 DIAGNOSIS — M9904 Segmental and somatic dysfunction of sacral region: Secondary | ICD-10-CM | POA: Diagnosis not present

## 2019-03-23 DIAGNOSIS — M9902 Segmental and somatic dysfunction of thoracic region: Secondary | ICD-10-CM | POA: Diagnosis not present

## 2019-03-23 DIAGNOSIS — M5416 Radiculopathy, lumbar region: Secondary | ICD-10-CM | POA: Diagnosis not present

## 2019-03-23 DIAGNOSIS — M9903 Segmental and somatic dysfunction of lumbar region: Secondary | ICD-10-CM | POA: Diagnosis not present

## 2019-03-23 DIAGNOSIS — M9904 Segmental and somatic dysfunction of sacral region: Secondary | ICD-10-CM | POA: Diagnosis not present

## 2019-03-23 DIAGNOSIS — M5412 Radiculopathy, cervical region: Secondary | ICD-10-CM | POA: Diagnosis not present

## 2019-03-23 DIAGNOSIS — M5414 Radiculopathy, thoracic region: Secondary | ICD-10-CM | POA: Diagnosis not present

## 2019-03-28 DIAGNOSIS — M9903 Segmental and somatic dysfunction of lumbar region: Secondary | ICD-10-CM | POA: Diagnosis not present

## 2019-03-28 DIAGNOSIS — M9904 Segmental and somatic dysfunction of sacral region: Secondary | ICD-10-CM | POA: Diagnosis not present

## 2019-03-28 DIAGNOSIS — M5414 Radiculopathy, thoracic region: Secondary | ICD-10-CM | POA: Diagnosis not present

## 2019-03-28 DIAGNOSIS — M9902 Segmental and somatic dysfunction of thoracic region: Secondary | ICD-10-CM | POA: Diagnosis not present

## 2019-03-28 DIAGNOSIS — M5416 Radiculopathy, lumbar region: Secondary | ICD-10-CM | POA: Diagnosis not present

## 2019-03-28 DIAGNOSIS — M5412 Radiculopathy, cervical region: Secondary | ICD-10-CM | POA: Diagnosis not present

## 2019-03-30 DIAGNOSIS — M5416 Radiculopathy, lumbar region: Secondary | ICD-10-CM | POA: Diagnosis not present

## 2019-03-30 DIAGNOSIS — M9904 Segmental and somatic dysfunction of sacral region: Secondary | ICD-10-CM | POA: Diagnosis not present

## 2019-03-30 DIAGNOSIS — M5412 Radiculopathy, cervical region: Secondary | ICD-10-CM | POA: Diagnosis not present

## 2019-03-30 DIAGNOSIS — M9903 Segmental and somatic dysfunction of lumbar region: Secondary | ICD-10-CM | POA: Diagnosis not present

## 2019-03-30 DIAGNOSIS — M5414 Radiculopathy, thoracic region: Secondary | ICD-10-CM | POA: Diagnosis not present

## 2019-03-30 DIAGNOSIS — M9902 Segmental and somatic dysfunction of thoracic region: Secondary | ICD-10-CM | POA: Diagnosis not present

## 2019-04-01 ENCOUNTER — Telehealth: Payer: Self-pay | Admitting: Family Medicine

## 2019-04-01 DIAGNOSIS — M5414 Radiculopathy, thoracic region: Secondary | ICD-10-CM | POA: Diagnosis not present

## 2019-04-01 DIAGNOSIS — M9902 Segmental and somatic dysfunction of thoracic region: Secondary | ICD-10-CM | POA: Diagnosis not present

## 2019-04-01 DIAGNOSIS — M5412 Radiculopathy, cervical region: Secondary | ICD-10-CM | POA: Diagnosis not present

## 2019-04-01 DIAGNOSIS — M9903 Segmental and somatic dysfunction of lumbar region: Secondary | ICD-10-CM | POA: Diagnosis not present

## 2019-04-01 DIAGNOSIS — M5416 Radiculopathy, lumbar region: Secondary | ICD-10-CM | POA: Diagnosis not present

## 2019-04-01 DIAGNOSIS — M9904 Segmental and somatic dysfunction of sacral region: Secondary | ICD-10-CM | POA: Diagnosis not present

## 2019-04-01 NOTE — Telephone Encounter (Signed)
Contacted pt and scheduled her an appointment on 5/7 with PCP to complete surgery clearance and get a recent EKG. Diana [name], CMA

## 2019-04-01 NOTE — Telephone Encounter (Signed)
Received records request for [facility] health for surgical clearance for neck surgery. Please call patient and help her schedule a visit with me or next available for this so we can go through her records and fill out this form. This form requests a new EKG, which we would need an in person visit for. I will leave the form in my box if anyone else sees her - they can find this form.

## 2019-04-04 DIAGNOSIS — M5416 Radiculopathy, lumbar region: Secondary | ICD-10-CM | POA: Diagnosis not present

## 2019-04-04 DIAGNOSIS — M9902 Segmental and somatic dysfunction of thoracic region: Secondary | ICD-10-CM | POA: Diagnosis not present

## 2019-04-04 DIAGNOSIS — M5412 Radiculopathy, cervical region: Secondary | ICD-10-CM | POA: Diagnosis not present

## 2019-04-04 DIAGNOSIS — M9904 Segmental and somatic dysfunction of sacral region: Secondary | ICD-10-CM | POA: Diagnosis not present

## 2019-04-04 DIAGNOSIS — M5414 Radiculopathy, thoracic region: Secondary | ICD-10-CM | POA: Diagnosis not present

## 2019-04-04 DIAGNOSIS — M9903 Segmental and somatic dysfunction of lumbar region: Secondary | ICD-10-CM | POA: Diagnosis not present

## 2019-04-06 DIAGNOSIS — M5416 Radiculopathy, lumbar region: Secondary | ICD-10-CM | POA: Diagnosis not present

## 2019-04-06 DIAGNOSIS — M9902 Segmental and somatic dysfunction of thoracic region: Secondary | ICD-10-CM | POA: Diagnosis not present

## 2019-04-06 DIAGNOSIS — M5414 Radiculopathy, thoracic region: Secondary | ICD-10-CM | POA: Diagnosis not present

## 2019-04-06 DIAGNOSIS — M5412 Radiculopathy, cervical region: Secondary | ICD-10-CM | POA: Diagnosis not present

## 2019-04-06 DIAGNOSIS — M9903 Segmental and somatic dysfunction of lumbar region: Secondary | ICD-10-CM | POA: Diagnosis not present

## 2019-04-06 DIAGNOSIS — M9904 Segmental and somatic dysfunction of sacral region: Secondary | ICD-10-CM | POA: Diagnosis not present

## 2019-04-07 ENCOUNTER — Other Ambulatory Visit: Payer: Self-pay

## 2019-04-07 ENCOUNTER — Ambulatory Visit (HOSPITAL_COMMUNITY)
Admission: RE | Admit: 2019-04-07 | Discharge: 2019-04-07 | Disposition: A | Discharge status: Home / Self Care | Payer: Medicaid Other | Source: Ambulatory Visit | Attending: Family Medicine | Admitting: Family Medicine

## 2019-04-07 ENCOUNTER — Encounter: Payer: Self-pay | Admitting: Family Medicine

## 2019-04-07 ENCOUNTER — Ambulatory Visit: Payer: Medicaid Other | Admitting: Family Medicine

## 2019-04-07 VITALS — BP 136/72 | HR 72 | Wt 130.8 lb

## 2019-04-07 DIAGNOSIS — Z01818 Encounter for other preprocedural examination: Secondary | ICD-10-CM | POA: Diagnosis not present

## 2019-04-07 DIAGNOSIS — Z01810 Encounter for preprocedural cardiovascular examination: Secondary | ICD-10-CM | POA: Diagnosis not present

## 2019-04-07 MED ORDER — ESOMEPRAZOLE MAGNESIUM 40 MG PO CPDR: 40.0000 mg | DELAYED_RELEASE_CAPSULE | Freq: Every day | ORAL | 1 refills | Status: DC

## 2019-04-07 MED ORDER — TIOTROPIUM BROMIDE-OLODATEROL 2.5-2.5 MCG/ACT IN AERS: 2.0000 | INHALATION_SPRAY | Freq: Every day | RESPIRATORY_TRACT | 3 refills | Status: DC

## 2019-04-07 NOTE — Progress Notes (Signed)
   CC: pre op eval for neck surgery  HPI  Exercise tolerance - she cannot walk up stairs without getting SOB and having back pain. No formal exercise. Can walk to the mailbox and back and does have to stop coming back.   No hx of CAD that she knows of. Never had echo or cath that she knows of. None in epic.   No OSA that she knows of. Someone at [facility] told her that she might need CPAP several years ago but did not get it.   COPD - no recent flares. She uses stiolto as directed, no recent rescue albuterol use.   Right now she is smoking 8 cigarettes per day. Increased lately due to stress.   ROS: Denies CP, SOB, abdominal pain, dysuria, changes in BMs.   CC, SH/smoking status, and VS noted  Objective: BP 136/72   Pulse 72   Wt 130 lb 12.8 oz (59.3 kg)   SpO2 98%   BMI 24.71 kg/m  Gen: NAD, alert, cooperative, and pleasant. HEENT: NCAT, EOMI, PERRL CV: RRR, no murmur Resp: CTAB, no wheezes, non-labored Ext: No edema, warm Neuro: Alert and oriented, Speech clear, No gross deficits  Assessment and plan:  PreOp Eval: patient is moderate risk for a low risk surgery. Per the NSQUIP calculator by ACS, she has 5% risk of serious complication and 0.7% cardiac risk. She is medically optimized from COPD perspective other than continued smoking cessation efforts. EKG reviewed - no change from previous. Letter written to this effect and will be faxed to surgeon's office.   Orders Placed This Encounter  Procedures  . EKG 12-Lead    Meds ordered this encounter  Medications  . esomeprazole (NEXIUM) 40 MG capsule    Sig: Take 1 capsule (40 mg total) by mouth daily at 12 noon.    Dispense:  90 capsule    Refill:  1  . Tiotropium Bromide-Olodaterol (STIOLTO RESPIMAT) 2.5-2.5 MCG/ACT AERS    Sig: Inhale 2 puffs into the lungs daily.    Dispense:  2 Inhaler    Refill:  3    [name], MD, PGY3 04/08/2019 3:19 PM

## 2019-04-07 NOTE — Patient Instructions (Signed)
I will send your documents to the surgeon. Your risk is fairly low, but the best thing to mitigate this risk is to continue to decrease smoking.

## 2019-04-08 DIAGNOSIS — M5412 Radiculopathy, cervical region: Secondary | ICD-10-CM | POA: Diagnosis not present

## 2019-04-08 DIAGNOSIS — M9903 Segmental and somatic dysfunction of lumbar region: Secondary | ICD-10-CM | POA: Diagnosis not present

## 2019-04-08 DIAGNOSIS — M5414 Radiculopathy, thoracic region: Secondary | ICD-10-CM | POA: Diagnosis not present

## 2019-04-08 DIAGNOSIS — M9904 Segmental and somatic dysfunction of sacral region: Secondary | ICD-10-CM | POA: Diagnosis not present

## 2019-04-08 DIAGNOSIS — M5416 Radiculopathy, lumbar region: Secondary | ICD-10-CM | POA: Diagnosis not present

## 2019-04-08 DIAGNOSIS — M9902 Segmental and somatic dysfunction of thoracic region: Secondary | ICD-10-CM | POA: Diagnosis not present

## 2019-04-11 DIAGNOSIS — M9903 Segmental and somatic dysfunction of lumbar region: Secondary | ICD-10-CM | POA: Diagnosis not present

## 2019-04-11 DIAGNOSIS — M5412 Radiculopathy, cervical region: Secondary | ICD-10-CM | POA: Diagnosis not present

## 2019-04-11 DIAGNOSIS — M5414 Radiculopathy, thoracic region: Secondary | ICD-10-CM | POA: Diagnosis not present

## 2019-04-11 DIAGNOSIS — M9904 Segmental and somatic dysfunction of sacral region: Secondary | ICD-10-CM | POA: Diagnosis not present

## 2019-04-11 DIAGNOSIS — M5416 Radiculopathy, lumbar region: Secondary | ICD-10-CM | POA: Diagnosis not present

## 2019-04-11 DIAGNOSIS — M9902 Segmental and somatic dysfunction of thoracic region: Secondary | ICD-10-CM | POA: Diagnosis not present

## 2019-04-21 DIAGNOSIS — M79603 Pain in arm, unspecified: Secondary | ICD-10-CM | POA: Diagnosis not present

## 2019-04-21 DIAGNOSIS — Z01818 Encounter for other preprocedural examination: Secondary | ICD-10-CM | POA: Diagnosis not present

## 2019-04-21 DIAGNOSIS — J984 Other disorders of lung: Secondary | ICD-10-CM | POA: Diagnosis not present

## 2019-04-21 DIAGNOSIS — Z79899 Other long term (current) drug therapy: Secondary | ICD-10-CM | POA: Diagnosis not present

## 2019-04-21 DIAGNOSIS — R918 Other nonspecific abnormal finding of lung field: Secondary | ICD-10-CM | POA: Diagnosis not present

## 2019-04-27 ENCOUNTER — Emergency Department (HOSPITAL_COMMUNITY): Payer: Medicaid Other

## 2019-04-27 ENCOUNTER — Encounter (HOSPITAL_COMMUNITY): Payer: Self-pay | Admitting: Emergency Medicine

## 2019-04-27 ENCOUNTER — Emergency Department (HOSPITAL_COMMUNITY)
Admission: EM | Admit: 2019-04-27 | Discharge: 2019-04-28 | Disposition: A | Discharge status: Home / Self Care | Payer: Medicaid Other | Attending: Emergency Medicine | Admitting: Emergency Medicine

## 2019-04-27 ENCOUNTER — Other Ambulatory Visit: Payer: Self-pay

## 2019-04-27 DIAGNOSIS — I10 Essential (primary) hypertension: Secondary | ICD-10-CM | POA: Insufficient documentation

## 2019-04-27 DIAGNOSIS — Y929 Unspecified place or not applicable: Secondary | ICD-10-CM | POA: Insufficient documentation

## 2019-04-27 DIAGNOSIS — M069 Rheumatoid arthritis, unspecified: Secondary | ICD-10-CM | POA: Insufficient documentation

## 2019-04-27 DIAGNOSIS — Y999 Unspecified external cause status: Secondary | ICD-10-CM | POA: Insufficient documentation

## 2019-04-27 DIAGNOSIS — W109XXA Fall (on) (from) unspecified stairs and steps, initial encounter: Secondary | ICD-10-CM | POA: Insufficient documentation

## 2019-04-27 DIAGNOSIS — M542 Cervicalgia: Secondary | ICD-10-CM | POA: Diagnosis not present

## 2019-04-27 DIAGNOSIS — Y9301 Activity, walking, marching and hiking: Secondary | ICD-10-CM | POA: Insufficient documentation

## 2019-04-27 DIAGNOSIS — S59911A Unspecified injury of right forearm, initial encounter: Secondary | ICD-10-CM | POA: Diagnosis present

## 2019-04-27 DIAGNOSIS — W19XXXA Unspecified fall, initial encounter: Secondary | ICD-10-CM

## 2019-04-27 DIAGNOSIS — F17210 Nicotine dependence, cigarettes, uncomplicated: Secondary | ICD-10-CM | POA: Insufficient documentation

## 2019-04-27 DIAGNOSIS — S6991XA Unspecified injury of right wrist, hand and finger(s), initial encounter: Secondary | ICD-10-CM | POA: Diagnosis not present

## 2019-04-27 DIAGNOSIS — M25531 Pain in right wrist: Secondary | ICD-10-CM | POA: Diagnosis not present

## 2019-04-27 DIAGNOSIS — Z79899 Other long term (current) drug therapy: Secondary | ICD-10-CM | POA: Diagnosis not present

## 2019-04-27 DIAGNOSIS — S199XXA Unspecified injury of neck, initial encounter: Secondary | ICD-10-CM | POA: Diagnosis not present

## 2019-04-27 DIAGNOSIS — M79641 Pain in right hand: Secondary | ICD-10-CM | POA: Diagnosis not present

## 2019-04-27 NOTE — Discharge Instructions (Addendum)
Wear removable wrist splint for comfort.  If pain continues follow-up with PCP for repeat imaging in 7 to 10 days.

## 2019-04-27 NOTE — ED Notes (Signed)
Patient transported to X-ray 

## 2019-04-27 NOTE — ED Notes (Signed)
Ortho tech paged for thumb spica

## 2019-04-27 NOTE — ED Provider Notes (Signed)
[facility] EMERGENCY DEPARTMENT Provider Note   CSN: [id number] Arrival date & time: 04/27/19  2111    History   Chief Complaint Chief Complaint  Patient presents with   Fall   Hand Injury    HPI TEREN FRANCKOWIAK is a 63 y.o. female.  HPI 63 year old female presents for evaluation of her fall.  Patient states that she slipped walking up stairs and fell forward onto her right arm.  She complains of right hand pain and neck pain.  Pain described as sharp, constant, worse with palpation.  She not hit her head or lose consciousness.  No open wounds.  Not on systemic anticoagulation.  Denies chest pain or shortness of breath.  Past Medical History:  Diagnosis Date   Anxiety    Asthma    Barrett esophagus    "don't know that I've ever been stretched" (08/17/2013)   Choledocholithiasis with acute cholecystitis    Chronic lower back pain    Craniosynostosis    congenital   DDD (degenerative disc disease)    Family history of anesthesia complication    "PONV; both parents" (08/17/2013)   GERD (gastroesophageal reflux disease)    H/O hiatal hernia    History of blood transfusion 1959-1960   Hypercholesterolemia 05/28/2012   Has taken Crestor and Lipitor in the past. Switched to lovastatin b/c its $4.     Hypertension    IBS (irritable bowel syndrome)    Migraines    "since age 4; probably twice/month" (08/17/2013)   PONV (postoperative nausea and vomiting)    Rheumatoid arthritis ([place])    "in my knuckles" (08/17/2013)   Ruptured lumbar disc 1990's   L4-L5   Shingles     Patient Active Problem List   Diagnosis Date Noted   Craniosynostosis 02/07/2019   Memory change 02/07/2019   Rectal bleeding 10/22/2018   Jaw claudication 09/06/2018   Kidney stones 07/22/2017   Fever 03/13/2017   Right leg pain 03/04/2017   Chronic obstructive pulmonary disease ([place]) 03/04/2017   H/O: hysterectomy 09/22/2014   Postmenopausal atrophic  vaginitis 09/22/2014   History of right hip replacement 07/24/2014   Postmenopausal bone loss 05/29/2014   Migraine headache with aura 05/24/2014   CKD (chronic kidney disease), 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   Irritable bowel syndrome (IBS) 06/07/2012   Restless leg syndrome 05/28/2012   Anxiety and depression 05/28/2012   Hypertension, essential, benign 05/28/2012   Tobacco use disorder, continuous 05/28/2012   Hyperlipidemia 05/28/2012   Barrett's esophagus 05/20/2012    Past Surgical History:  Procedure Laterality Date   ABDOMINAL HYSTERECTOMY  1992   Total for chronic pelvic pain    APPENDECTOMY     BACK SURGERY  1990's   L4-L5 fusion @ [place]  2007   Performed in [place]    CRANIECTOMY [place]   "1st time got infected; 2nd time didn't take; fix the 3rd time" (08/17/2013)   POSTERIOR LUMBAR FUSION  1990's   "took piece off my hip" (08/17/2013)   TOTAL HIP ARTHROPLASTY Right 12/31/04   AVN after fall injury; performed by Ralene Cork; DePuy S-ROM total hip (metal on metal)     OB History   No obstetric history on file.      Home Medications    Prior to Admission medications   Medication Sig Start Date End Date Taking? Authorizing Provider  albuterol (PROVENTIL HFA;VENTOLIN HFA) 108 (90 Base) MCG/ACT inhaler Inhale 2 puffs into the lungs every  6 (six) hours as needed for wheezing. 08/03/18   Sela Hilding, MD  amLODipine (NORVASC) 5 MG tablet Take 1 tablet (5 mg total) by mouth daily. 05/14/18   Rogue Bussing, MD  diclofenac sodium (VOLTAREN) 1 % GEL Apply 2 g topically 4 (four) times daily as needed. 10/25/18   Sela Hilding, MD  esomeprazole (NEXIUM) 40 MG capsule Take 1 capsule (40 mg total) by mouth daily at 12 noon. 04/07/19   Sela Hilding, MD  LIVALO 1 MG TABS Take 1 tablet by mouth at bedtime. 10/13/18   [provider]  propranolol (INDERAL) 40 MG tablet Take 1  tablet (40 mg total) by mouth 3 (three) times daily. 05/14/18   Rogue Bussing, MD  Tiotropium Bromide-Olodaterol (STIOLTO RESPIMAT) 2.5-2.5 MCG/ACT AERS Inhale 2 puffs into the lungs daily. 04/07/19   Sela Hilding, MD  varenicline (CHANTIX PAK) 0.5 MG X 11 & 1 MG X 42 tablet Take by mouth 2 (two) times daily. Take one 0.5 mg tablet by mouth once daily for 3 days, then increase to one 0.5 mg tablet twice daily for 4 days, then increase to one 1 mg tablet twice daily.    [provider]    Family History Family History  Problem Relation Age of Onset   Hypertension Mother    Hypertension Father    Skin cancer Father    Colon cancer Paternal Grandfather    Rectal cancer Neg Hx     Social History Social History   Tobacco Use   Smoking status: Current Every Day Smoker    Packs/day: 0.50    Years: 32.00    Pack years: 16.00    Types: Cigarettes   Smokeless tobacco: Never Used   Tobacco comment: on chanix  Substance Use Topics   Alcohol use: Yes    Alcohol/week: 0.0 standard drinks    Comment: occasionally   Drug use: No     Allergies   Penicillins; Demerol [meperidine]; Doxycycline hyclate; Gabapentin; Sulfa antibiotics; Levaquin [levofloxacin]; Benadryl [diphenhydramine hcl]; Buprenorphine hcl; Cephalexin; Morphine and related; and Spiriva handihaler [tiotropium bromide monohydrate]   Review of Systems Review of Systems  Constitutional: Negative for chills and fever.  HENT: Negative for ear pain and sore throat.   Eyes: Negative for pain and visual disturbance.  Respiratory: Negative for cough and shortness of breath.   Cardiovascular: Negative for chest pain and palpitations.  Gastrointestinal: Negative for abdominal pain and vomiting.  Genitourinary: Negative for dysuria and hematuria.  Musculoskeletal: Positive for neck pain. Negative for arthralgias and back pain.       Right hand pain  Skin: Negative for color change and rash.    Neurological: Negative for seizures and syncope.  All other systems reviewed and are negative.    Physical Exam Updated Vital Signs BP 140/75    Pulse (!) 59    Temp 97.8 F (36.6 C) (Oral)    Resp 14    Ht 5\' 1"  (1.549 m)    Wt 59 kg    SpO2 96%    BMI 24.56 kg/m   Physical Exam Vitals signs and nursing note reviewed.  Constitutional:      General: She is not in acute distress.    Appearance: She is well-developed.  HENT:     Head: Normocephalic and atraumatic.  Eyes:     Extraocular Movements: Extraocular movements intact.     Conjunctiva/sclera: Conjunctivae normal.     Pupils: Pupils are equal, round, and reactive to light.  Neck:     Musculoskeletal: Normal range of motion and neck supple. No neck rigidity.     Comments: Midline cervical tenderness Cardiovascular:     Rate and Rhythm: Normal rate and regular rhythm.     Heart sounds: No murmur.  Pulmonary:     Effort: Pulmonary effort is normal. No respiratory distress.     Breath sounds: Normal breath sounds.  Abdominal:     Palpations: Abdomen is soft.     Tenderness: There is no abdominal tenderness.  Musculoskeletal:     Comments: INSPECTION, ALIGNMENT & PALPATION: No gross deformity. No open wounds. No swelling or ecchymosis. No masses, crepitance, or effusion. Tenderness to palpation over the right thumb and thenar eminence.  ROM:  Intact AROM and PROM of the right shoulder, elbow, wrist, and hand  SENSORY: sensation is intact to light touch in:  superficial radial nerve distribution (dorsal first web space) median nerve distribution (tip of index finger) ulnar nerve distribution (tip of small finger)  MOTOR:  + motor posterior interosseous nerve (thumb IP extension) + anterior interosseous nerve (thumb IP flexion, index finger DIP flexion) + radial nerve (wrist extension) + median nerve (palpable firing thenar mass) + ulnar nerve (palpable firing of first dorsal interosseous muscle)  VASCULAR: 2+  radial pulse, brisk capillary refill < 2 sec, fingers warm and well-perfused  COMPARTMENTS: Soft and compressible. No pain with passive stretch. No paresthesias.   Skin:    General: Skin is warm and dry.  Neurological:     General: No focal deficit present.     Mental Status: She is alert and oriented to person, place, and time.     Comments: 5 out of 5 strength in bilateral upper and lower extremities      ED Treatments / Results  Labs (all labs ordered are listed, but only abnormal results are displayed) Labs Reviewed - No data to display  EKG None  Radiology Dg Wrist Complete Right  Result Date: 04/27/2019 CLINICAL DATA:  Fall EXAM: RIGHT WRIST - COMPLETE 3+ VIEW COMPARISON:  None. FINDINGS: There is no evidence of fracture or dislocation. There is no evidence of arthropathy or other focal bone abnormality. Soft tissues are unremarkable. IMPRESSION: Negative. Electronically Signed   By: Donavan Foil M.D.   On: 04/27/2019 22:46   Ct Cervical Spine Wo Contrast  Result Date: 04/27/2019 CLINICAL DATA:  Status post fall, tingling and burning in the right arm EXAM: CT CERVICAL SPINE WITHOUT CONTRAST TECHNIQUE: Multidetector CT imaging of the cervical spine was performed without intravenous contrast. Multiplanar CT image reconstructions were also generated. COMPARISON:  CT myelogram cervical spine 02/01/2015 FINDINGS: Alignment: Normal. Skull base and vertebrae: No acute fracture. No primary bone lesion or focal pathologic process. Soft tissues and spinal canal: No prevertebral fluid or swelling. No visible canal hematoma. Disc levels: Minimal disc height loss at C6-7 with mild broad-based disc bulge. No foraminal stenosis. Upper chest: Lung apices are clear. Other: No fluid collection or hematoma. IMPRESSION: 1. No acute osseous injury the cervical spine. Electronically Signed   By: Kathreen Devoid   On: 04/27/2019 22:54   Dg Hand Complete Right  Result Date: 04/27/2019 CLINICAL DATA:   Fall with wrist and hand pain EXAM: RIGHT HAND - COMPLETE 3+ VIEW COMPARISON:  None. FINDINGS: No fracture or malalignment. 2 mm linear opacity adjacent to the second DIP joint. Soft tissue swelling at the digits. IMPRESSION: 1. No acute osseous abnormality 2. 2 mm linear opacity adjacent to second DIP joint,  may reflect small soft tissue foreign body Electronically Signed   By: Donavan Foil M.D.   On: 04/27/2019 22:45    Procedures Procedures (including critical care time)  Medications Ordered in ED Medications - No data to display   Initial Impression / Assessment and Plan / ED Course  I have reviewed the triage vital signs and the nursing notes.  Pertinent labs & imaging results that were available during my care of the patient were reviewed by me and considered in my medical decision making (see chart for details).  63 year old female presents for evaluation of her fall.  Patient states that she slipped walking up stairs and fell forward onto her right arm.  Hemodynamically stable.  Afebrile.  Afocal neuro exam.  Low clinical suspicion for intracranial pathology requiring CT imaging at this time.  Patient does have midline cervical tenderness.  Right upper extremities neurovascular intact.  Chest tenderness predominantly to the thenar eminence of her right hand.  No snuffbox tenderness.  CT of the C-spine shows no acute abnormalities.  X-rays negative for acute fracture.  Patient placed in thumb spica removable splint and recommended follow-up in 7 to 10 days if pain is not improving.  Patient discharged home in stable condition with strict return precautions.  Final Clinical Impressions(s) / ED Diagnoses   Final diagnoses:  Fall, initial encounter    ED Discharge Orders    None       Trinidad Curet, MD 04/28/19 [place], [place], DO 04/28/19 1615

## 2019-04-27 NOTE — ED Triage Notes (Signed)
Pt. From home c/o of pain in right arm and right hand after falling due to slippery stairs. States pain 6 on scale 0-10 described as tingling, burning, and aching with a throbbing pain on right back.

## 2019-04-28 NOTE — ED Provider Notes (Signed)
I have personally seen and examined the patient. I have reviewed the documentation on PMH/FH/Soc Hx. I have discussed the plan of care with the resident and patient.  I have reviewed and agree with the resident's documentation. Please see associated encounter note.  Briefly, the patient is a 63 y.o. female here with right hand pain after fall.  Patient states that she was walking upstairs and tripped and fell onto her outstretched right hand.  Patient mostly has tenderness over the right scaphoid area.  No lacerations.  Patient has history of chronic neck pain and states that she feels like she is in a spasm.  She does have some midline spinal tenderness.  Otherwise she is neurovascularly and neuromuscularly intact on exam.  Does not appear to have any ligamentous injury of the spine.  CT scan of the neck is unremarkable.  X-rays of the right hand and wrist were unremarkable.  No obvious foreign body on exam.  Patient is placed in a thumb spica splint given scaphoid tenderness.  Educated about need for repeat x-ray to rule out scaphoid injury.  Discharged from ED in good condition.  Recommend Tylenol/Motrin for pain.  This chart was dictated using voice recognition software.  Despite best efforts to proofread,  errors can occur which can change the documentation meaning.     EKG Interpretation None         Lennice Sites, DO 04/28/19 0023

## 2019-04-28 NOTE — ED Notes (Signed)
Patient verbalizes understanding of discharge instructions. Opportunity for questioning and answers were provided. Armband removed by staff, pt discharged from ED.  

## 2019-05-18 DIAGNOSIS — M47812 Spondylosis without myelopathy or radiculopathy, cervical region: Secondary | ICD-10-CM | POA: Diagnosis not present

## 2019-05-18 DIAGNOSIS — M50122 Cervical disc disorder at C5-C6 level with radiculopathy: Secondary | ICD-10-CM | POA: Diagnosis not present

## 2019-05-18 DIAGNOSIS — M50123 Cervical disc disorder at C6-C7 level with radiculopathy: Secondary | ICD-10-CM | POA: Diagnosis not present

## 2019-05-18 DIAGNOSIS — M5412 Radiculopathy, cervical region: Secondary | ICD-10-CM | POA: Diagnosis not present

## 2019-05-28 HISTORY — PX: CERVICAL SPINE SURGERY: SHX589

## 2019-05-29 ENCOUNTER — Telehealth: Payer: Self-pay | Admitting: Family Medicine

## 2019-05-29 DIAGNOSIS — R1084 Generalized abdominal pain: Secondary | ICD-10-CM | POA: Diagnosis not present

## 2019-05-29 DIAGNOSIS — N3000 Acute cystitis without hematuria: Secondary | ICD-10-CM | POA: Diagnosis not present

## 2019-05-29 DIAGNOSIS — K5900 Constipation, unspecified: Secondary | ICD-10-CM | POA: Diagnosis not present

## 2019-05-29 DIAGNOSIS — R1010 Upper abdominal pain, unspecified: Secondary | ICD-10-CM | POA: Diagnosis not present

## 2019-05-29 DIAGNOSIS — N3091 Cystitis, unspecified with hematuria: Secondary | ICD-10-CM | POA: Diagnosis not present

## 2019-05-29 NOTE — Telephone Encounter (Signed)
**  After Hours/ Emergency Line Call**  Received a call to report that Diana [name] reporting constipation.  Patient reports she has not had bowel movement since last Wed. Reports having lots of cramping and abdominal pain on Wed. Took colace x2 days without any results. Has tried to insert finger into rectum and feels no stool. Has h/o IBS with constipation, but this time can't get her bowels to move. Wondering if there is an obstruction since there is no stool and usually she can feel stool. Keeps taking colace without improvement. Has also used miralax daily. Denies nausea or vomiting but reports significant pain, so much so it is difficult to sit. Recommended that patient be seen in urgent care today vs. ED. Patient will likely need imaging and physician to do abdominal exam. Concern for possible bowel obstruction vs. Severe constipation.  Red flags discussed.  Will forward to PCP.    Caroline More, DO PGY-2, [facility] 05/29/2019 11:03 AM

## 2019-05-31 ENCOUNTER — Other Ambulatory Visit: Payer: Self-pay | Admitting: Internal Medicine

## 2019-06-08 ENCOUNTER — Other Ambulatory Visit: Payer: Self-pay | Admitting: Internal Medicine

## 2019-06-09 ENCOUNTER — Telehealth: Payer: Self-pay | Admitting: *Deleted

## 2019-06-09 NOTE — Telephone Encounter (Signed)
Completed PA info in [employer] for Livalo.  Status pending.  Will recheck status in 24 hours. Christen Bame, CMA

## 2019-06-13 NOTE — Telephone Encounter (Signed)
Prior approval for livalo completed via [employer].  Med approved for 06/09/2019 - 06/03/2020. Prior approval # [id number].  [place] informed.  Christen Bame, CMA

## 2019-06-22 ENCOUNTER — Encounter: Payer: Self-pay | Admitting: Family Medicine

## 2019-06-27 ENCOUNTER — Telehealth: Payer: Self-pay | Admitting: General Surgery

## 2019-06-27 NOTE — Telephone Encounter (Signed)
Covid-19 screening questions   Do you now or have you had a fever in the last 14 days? no  Do you have any respiratory symptoms of shortness of breath or cough now or in the last 14 days? no  Do you have any family members or close contacts with diagnosed or suspected Covid-19 in the past 14 days? no  Have you been tested for Covid-19 and found to be positive? No  Patient and healthcare partner informed to wear a mask. Patient verbalized understanding.

## 2019-06-28 ENCOUNTER — Ambulatory Visit: Payer: Medicaid Other | Admitting: Nurse Practitioner

## 2019-06-28 ENCOUNTER — Encounter: Payer: Self-pay | Admitting: Nurse Practitioner

## 2019-06-28 VITALS — BP 130/72 | HR 69 | Temp 97.8°F | Ht 61.0 in | Wt 132.0 lb

## 2019-06-28 DIAGNOSIS — K5904 Chronic idiopathic constipation: Secondary | ICD-10-CM | POA: Diagnosis not present

## 2019-06-28 DIAGNOSIS — K22719 Barrett's esophagus with dysplasia, unspecified: Secondary | ICD-10-CM | POA: Diagnosis not present

## 2019-06-28 DIAGNOSIS — R1030 Lower abdominal pain, unspecified: Secondary | ICD-10-CM | POA: Diagnosis not present

## 2019-06-28 MED ORDER — DICYCLOMINE HCL 10 MG PO CAPS: 10.0000 mg | ORAL_CAPSULE | Freq: Two times a day (BID) | ORAL | 3 refills | Status: DC | PRN

## 2019-06-28 MED ORDER — LINACLOTIDE 145 MCG PO CAPS: 145.0000 ug | ORAL_CAPSULE | Freq: Every day | ORAL | 5 refills | Status: DC

## 2019-06-28 NOTE — Progress Notes (Signed)
____________________________________________________________  Attending physician addendum:  Thank you for sending this case to me. I have reviewed the entire note and the outlined plan.  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  2015 EGD and pathology reports show short-segment Barrett's esophagus, so she is overdue for surveillance EGD.    I will forward this to my clinic staff to have them contact her to schedule EGD.  Wilfrid Lund, MD  ____________________________________________________________

## 2019-06-28 NOTE — Patient Instructions (Signed)
We have sent the following medications to your pharmacy for you to pick up at your convenience:  Linzess 145 mcg daily   Bentyl 10 mg twice a day as needed for pain

## 2019-06-28 NOTE — Progress Notes (Signed)
Chief Complaint:    Abdominal pain / constipation  IMPRESSION and PLAN:    54.  63 year old female with chronic constipation.  Her bowel movements are doing well after starting Linzess 2 weeks ago.  She will need a refill as original prescription was from urgent care clinic.  -Refill Linzess 45 mcg daily on empty stomach -Advised to increase fluid intake to at least 8 ounces of water a day, cut back on caffeinated beverages.  At present she is only consuming about 8 ounces of water a day  2. Chronic intermittent lower abdominal discomfort, mainly occurs during defecation.  May be bowel spasms.  -Trial of Bentyl 10 mg twice daily as needed.  Cautioned its potential to cause constipation so use judiciously  3.  Tobacco abuse.  No plans to stop at present.  She had stopped smoking for a couple of years after taking Chantix but restarted approximately 10 years ago.  She tried Chantix again a couple of months ago but it did not work this time.  I encouraged her to talk with PCP about alternatives to help her stop smoking  4. Barrett's esophagus (couldn't tell by EGD report April 2015 if long or short segment). Path c/w with Barrett's without dysplasia.  -I'm sure she need surveillance EGD, will defer timing of that to Dr. Loletha Carrow.  -continue PPI      HPI:     Patient is a 63 yo female with PMH of HTN, hyperlipidemia, tobacco abuse, chronic constipation, ischemic colitis and Barrett's esophagus. She was previously followed by Dr. Deatra Ina, now under the care of Dr. Loletha Carrow.  She had a bout of what was probably ischemic colitis November 2019 .  Follow-up colonoscopy December 2019 showed resolution of colitis.  Four small adenomas were removed at that time. She was doing well at time of last follow up late January of this year.    Patient comes in today for evaluation of severe constipation.  She was seen at urgent care June 28 for evaluation of severe constipation/impaction.  Patient tells me  x-rays were done and she was full of stool.  She was given some sort of a liquid to purge bowels then started on Linzess 145 mcg daily.  Before starting Linzess patient wanted to try something more natural, took Metamucil every day for couple of weeks.  Without improvement in constipation she started the Linzess approximately 2 weeks ago.  She is taking it on an empty stomach and having a bowel movement about every other day.  Stool consistency varies but overall she is pleased with bowel habits and Medicaid covers the medication for only $3.  Mila has continued to have intermittent lower abdominal pain since bout of what was probably ischemic colitis back in November 2019.  And feels like she has been cut with glass, it is worse during defecation.  Pain usually improves following a bowel movement if she has adequate output.  Patient is not drinking nearly enough water, admits to maybe 8 ounces a day.  She drinks caffeinated beverages throughout the day however.  Review of systems:     No chest pain, no SOB, no fevers, no urinary sx   Past Medical History:  Diagnosis Date  . Anxiety   . Asthma   . Barrett esophagus    "don't know that I've ever been stretched" (08/17/2013)  . Choledocholithiasis with acute cholecystitis   . Chronic lower back pain   . Craniosynostosis    congenital  .  DDD (degenerative disc disease)   . Family history of anesthesia complication    "PONV; both parents" (08/17/2013)  . GERD (gastroesophageal reflux disease)   . H/O hiatal hernia   . History of blood transfusion 1959-1960  . Hypercholesterolemia 05/28/2012   Has taken Crestor and Lipitor in the past. Switched to lovastatin b/c its $4.    . Hypertension   . IBS (irritable bowel syndrome)   . Migraines    "since age 72; probably twice/month" (08/17/2013)  . PONV (postoperative nausea and vomiting)   . Rheumatoid arthritis ([place])    "in my knuckles" (08/17/2013)  . Ruptured lumbar disc 1990's   L4-L5  .  Shingles     Patient's surgical history, family medical history, social history, medications and allergies were all reviewed in Epic   Creatinine clearance cannot be calculated (Patient's most recent lab result is older than the maximum 21 days allowed.)  Current Outpatient Medications  Medication Sig Dispense Refill  . albuterol (PROVENTIL HFA;VENTOLIN HFA) 108 (90 Base) MCG/ACT inhaler Inhale 2 puffs into the lungs every 6 (six) hours as needed for wheezing. 8.5 g 5  . amLODipine (NORVASC) 5 MG tablet Take 1 tablet by mouth once daily 30 tablet 3  . diclofenac sodium (VOLTAREN) 1 % GEL Apply 2 g topically 4 (four) times daily as needed. 100 g 5  . esomeprazole (NEXIUM) 40 MG capsule Take 1 capsule (40 mg total) by mouth daily at 12 noon. 90 capsule 1  . linaclotide (LINZESS) 145 MCG CAPS capsule Take 145 mcg by mouth daily before breakfast.    . LIVALO 1 MG TABS TAKE 1 TABLET BY MOUTH AT BEDTIME 30 tablet 0  . propranolol (INDERAL) 40 MG tablet Take 1 tablet (40 mg total) by mouth 3 (three) times daily. 90 tablet 3  . Tiotropium Bromide-Olodaterol (STIOLTO RESPIMAT) 2.5-2.5 MCG/ACT AERS Inhale 2 puffs into the lungs daily. 2 Inhaler 3  . varenicline (CHANTIX PAK) 0.5 MG X 11 & 1 MG X 42 tablet Take by mouth 2 (two) times daily. Take one 0.5 mg tablet by mouth once daily for 3 days, then increase to one 0.5 mg tablet twice daily for 4 days, then increase to one 1 mg tablet twice daily.     No current facility-administered medications for this visit.     Physical Exam:     BP 130/72   Pulse 69   Temp 97.8 F (36.6 C)   Ht 5\' 1"  (1.549 m)   Wt 132 lb (59.9 kg)   BMI 24.94 kg/m   GENERAL:  Pleasant female in NAD PSYCH: : Cooperative, normal affect EENT:  conjunctiva pink, mucous membranes moist, neck supple without masses CARDIAC:  RRR,  no peripheral edema PULM: Normal respiratory effort, lungs CTA bilaterally, no wheezing ABDOMEN:  Nondistended, soft, nontender. No obvious  masses, no hepatomegaly,  normal bowel sounds SKIN:  turgor, no lesions seen Musculoskeletal:  Normal muscle tone, normal strength NEURO: Alert and oriented x 3, no focal neurologic deficits   Tye Savoy , NP 06/28/2019, 10:59 AM

## 2019-06-29 ENCOUNTER — Telehealth: Payer: Self-pay | Admitting: *Deleted

## 2019-06-29 NOTE — Telephone Encounter (Signed)
Spoke to the patient, scheduled for EGD for Barrett's surveillance on 07/29/2019 at 9:30 am in the [facility]. Telephone pre-visit with nurse scheduled on 8/17 at 1:30 pm. No additional questions or concerns expressed by the patient at the time of the call.

## 2019-07-05 DIAGNOSIS — Z981 Arthrodesis status: Secondary | ICD-10-CM | POA: Diagnosis not present

## 2019-07-05 DIAGNOSIS — M5412 Radiculopathy, cervical region: Secondary | ICD-10-CM | POA: Diagnosis not present

## 2019-07-05 DIAGNOSIS — M47812 Spondylosis without myelopathy or radiculopathy, cervical region: Secondary | ICD-10-CM | POA: Diagnosis not present

## 2019-07-15 DIAGNOSIS — M549 Dorsalgia, unspecified: Secondary | ICD-10-CM | POA: Diagnosis not present

## 2019-07-15 DIAGNOSIS — R300 Dysuria: Secondary | ICD-10-CM | POA: Diagnosis not present

## 2019-07-18 ENCOUNTER — Ambulatory Visit: Payer: Medicaid Other | Admitting: *Deleted

## 2019-07-18 ENCOUNTER — Other Ambulatory Visit: Payer: Self-pay

## 2019-07-18 VITALS — Ht 61.0 in | Wt 135.0 lb

## 2019-07-18 DIAGNOSIS — K22719 Barrett's esophagus with dysplasia, unspecified: Secondary | ICD-10-CM

## 2019-07-18 NOTE — Progress Notes (Signed)
Previsit via telephone, ID per name,dob and address  No egg or soy allergy known to patient  No issues with past sedation with any surgeries  or procedures, no intubation problems  No diet pills per patient No home 02 use per patient  No blood thinners per patient  Pt denies issues with constipation  No A fib or A flutter  EMMI  Information, consent, instructions and acknowledgement form for return in provided envelope.Patient verbalizing understanding

## 2019-07-25 ENCOUNTER — Encounter: Payer: Self-pay | Admitting: Gastroenterology

## 2019-07-26 DIAGNOSIS — M79609 Pain in unspecified limb: Secondary | ICD-10-CM | POA: Diagnosis not present

## 2019-07-26 DIAGNOSIS — M1612 Unilateral primary osteoarthritis, left hip: Secondary | ICD-10-CM | POA: Diagnosis not present

## 2019-07-26 DIAGNOSIS — Z01818 Encounter for other preprocedural examination: Secondary | ICD-10-CM | POA: Diagnosis not present

## 2019-07-26 DIAGNOSIS — Z79899 Other long term (current) drug therapy: Secondary | ICD-10-CM | POA: Diagnosis not present

## 2019-07-26 DIAGNOSIS — R52 Pain, unspecified: Secondary | ICD-10-CM | POA: Diagnosis not present

## 2019-07-28 ENCOUNTER — Telehealth: Payer: Self-pay

## 2019-07-28 NOTE — Telephone Encounter (Signed)
Covid-19 screening questions   Do you now or have you had a fever in the last 14 days? NO   Do you have any respiratory symptoms of shortness of breath or cough now or in the last 14 days? NO  Do you have any family members or close contacts with diagnosed or suspected Covid-19 in the past 14 days? NO  Have you been tested for Covid-19 and found to be positive? NO        

## 2019-07-29 ENCOUNTER — Encounter: Payer: Self-pay | Admitting: Gastroenterology

## 2019-07-29 ENCOUNTER — Ambulatory Visit (AMBULATORY_SURGERY_CENTER): Payer: Medicaid Other | Admitting: Gastroenterology

## 2019-07-29 ENCOUNTER — Other Ambulatory Visit: Payer: Self-pay

## 2019-07-29 VITALS — BP 153/78 | HR 86 | Temp 98.5°F | Resp 19 | Ht 61.0 in | Wt 132.0 lb

## 2019-07-29 DIAGNOSIS — K317 Polyp of stomach and duodenum: Secondary | ICD-10-CM | POA: Diagnosis not present

## 2019-07-29 DIAGNOSIS — K3189 Other diseases of stomach and duodenum: Secondary | ICD-10-CM

## 2019-07-29 DIAGNOSIS — K2270 Barrett's esophagus without dysplasia: Secondary | ICD-10-CM

## 2019-07-29 DIAGNOSIS — K2970 Gastritis, unspecified, without bleeding: Secondary | ICD-10-CM

## 2019-07-29 MED ORDER — SODIUM CHLORIDE 0.9 % IV SOLN: 500.0000 mL | Freq: Once | INTRAVENOUS | Status: DC

## 2019-07-29 NOTE — Progress Notes (Signed)
Called to room to assist during endoscopic procedure.  Patient ID and intended procedure confirmed with present staff. Received instructions for my participation in the procedure from the performing physician.  

## 2019-07-29 NOTE — Progress Notes (Signed)
No problems noted in the recovery room. maw 

## 2019-07-29 NOTE — Progress Notes (Signed)
Pt's states no medical or surgical changes since previsit or office visit.  Temps taken by AR VS taken by CW

## 2019-07-29 NOTE — Op Note (Signed)
[facility] Patient Name: Diana [name] Procedure Date: 07/29/2019 9:51 AM MRN: [id number] Endoscopist: Mallie Mussel L. Loletha Carrow , MD Age: 63 Referring MD:  Date of Birth: [birth year] Gender: Female Account #: [id number] Procedure:                Upper GI endoscopy Indications:              Surveillance for malignancy due to personal history                            of Barrett's esophagus (last surveillance 2015) Medicines:                Monitored Anesthesia Care Procedure:                Pre-Anesthesia Assessment:                           - Prior to the procedure, a History and Physical                            was performed, and patient medications and                            allergies were reviewed. The patient's tolerance of                            previous anesthesia was also reviewed. The risks                            and benefits of the procedure and the sedation                            options and risks were discussed with the patient.                            All questions were answered, and informed consent                            was obtained. Prior Anticoagulants: The patient has                            taken no previous anticoagulant or antiplatelet                            agents. ASA Grade Assessment: III - A patient with                            severe systemic disease. After reviewing the risks                            and benefits, the patient was deemed in                            satisfactory condition to undergo the procedure.  After obtaining informed consent, the endoscope was                            passed under direct vision. Throughout the                            procedure, the patient's blood pressure, pulse, and                            oxygen saturations were monitored continuously. The                            Endoscope was introduced through the mouth, and   advanced to the second part of duodenum. The upper                            GI endoscopy was accomplished without difficulty.                            The patient tolerated the procedure well. Scope In: Scope Out: Findings:                 There were esophageal mucosal changes secondary to                            established short-segment Barrett's disease present                            in the distal esophagus (several tongues of                            salmon-colored mucosa above the upper limit of                            gastric folds). The maximum longitudinal extent of                            these mucosal changes was 2 cm in length. There                            were no raised or suspicious areas under WL or NBI.                            It is similar in description to that in the 2015                            EGD report) Mucosa was biopsied with a cold forceps                            for histology. One specimen bottle was sent to                            pathology.  The exam of the esophagus was otherwise normal.                           A few small sessile fundic gland polyps were found                            in the gastric fundus and in the gastric body.                           The exam of the stomach was otherwise normal.                           The cardia and gastric fundus were normal on                            retroflexion.                           The examined duodenum was normal. Complications:            No immediate complications. Estimated Blood Loss:     Estimated blood loss: none. Estimated blood loss                            was minimal. Impression:               - Esophageal mucosal changes secondary to                            established short-segment Barrett's disease.                            Biopsied.                           - A few fundic gland polyps.                           -  Normal examined duodenum. Recommendation:           - Patient has a contact number available for                            emergencies. The signs and symptoms of potential                            delayed complications were discussed with the                            patient. Return to normal activities tomorrow.                            Written discharge instructions were provided to the                            patient.                           -  Resume previous diet.                           - Continue present medications.                           - Await pathology results.                           - Repeat upper endoscopy for surveillance based on                            pathology results.                           - Stop smoking. Henry L. Loletha Carrow, MD 07/29/2019 10:19:06 AM This report has been signed electronically.

## 2019-07-29 NOTE — Patient Instructions (Signed)
YOU HAD AN ENDOSCOPIC PROCEDURE TODAY AT [facility]:   Refer to the procedure report that was given to you for any specific questions about what was found during the examination.  If the procedure report does not answer your questions, please call your gastroenterologist to clarify.  If you requested that your care partner not be given the details of your procedure findings, then the procedure report has been included in a sealed envelope for you to review at your convenience later.  YOU SHOULD EXPECT: Some feelings of bloating in the abdomen. Passage of more gas than usual.  Walking can help get rid of the air that was put into your GI tract during the procedure and reduce the bloating. If you had a lower endoscopy (such as a colonoscopy or flexible sigmoidoscopy) you may notice spotting of blood in your stool or on the toilet paper. If you underwent a bowel prep for your procedure, you may not have a normal bowel movement for a few days.  Please Note:  You might notice some irritation and congestion in your nose or some drainage.  This is from the oxygen used during your procedure.  There is no need for concern and it should clear up in a day or so.  SYMPTOMS TO REPORT IMMEDIATELY:     Following upper endoscopy (EGD)  Vomiting of blood or coffee ground material  New chest pain or pain under the shoulder blades  Painful or persistently difficult swallowing  New shortness of breath  Fever of 100F or higher  Black, tarry-looking stools  For urgent or emergent issues, a gastroenterologist can be reached at any hour by calling [phone].   DIET:  We do recommend a small meal at first, but then you may proceed to your regular diet.  Drink plenty of fluids but you should avoid alcoholic beverages for 24 hours.  ACTIVITY:  You should plan to take it easy for the rest of today and you should NOT DRIVE or use heavy machinery until tomorrow (because of the sedation medicines  used during the test).    FOLLOW UP: Our staff will call the number listed on your records 48-72 hours following your procedure to check on you and address any questions or concerns that you may have regarding the information given to you following your procedure. If we do not reach you, we will leave a message.  We will attempt to reach you two times.  During this call, we will ask if you have developed any symptoms of COVID 19. If you develop any symptoms (ie: fever, flu-like symptoms, shortness of breath, cough etc.) before then, please call [phone].  If you test positive for Covid 19 in the 2 weeks post procedure, please call and report this information to [place].    If any biopsies were taken you will be contacted by phone or by letter within the next 1-3 weeks.  Please call us at [phone] if you have not heard about the biopsies in 3 weeks.    SIGNATURES/CONFIDENTIALITY: You and/or your care partner have signed paperwork which will be entered into your electronic medical record.  These signatures attest to the fact that that the information above on your After Visit Summary has been reviewed and is understood.  Full responsibility of the confidentiality of this discharge information lies with you and/or your care-partner.    Handouts were given to you on Barrett's Esophagus. You may resume your current medications today. Await biopsy results.  Stop smoking per Dr. Loletha Carrow. Please call if any questions or concerns.

## 2019-07-29 NOTE — Progress Notes (Signed)
To PACU, VSS. Report to [email or web address] 

## 2019-08-02 ENCOUNTER — Telehealth: Payer: Self-pay

## 2019-08-02 NOTE — Telephone Encounter (Signed)
  Follow up Call-  Call back number 07/29/2019 11/19/2018  Post procedure Call Back phone  # [phone]  Permission to leave phone message Yes Yes  Some recent data might be hidden     Patient questions:  Do you have a fever, pain , or abdominal swelling? No. Pain Score  0 *  Have you tolerated food without any problems? Yes.    Have you been able to return to your normal activities? Yes.    Do you have any questions about your discharge instructions: Diet   No. Medications  No. Follow up visit  No.  Do you have questions or concerns about your Care? No.  Actions: * If pain score is 4 or above: 1. No action needed, pain <4.Have you developed a fever since your procedure? no  2.   Have you had an respiratory symptoms (SOB or cough) since your procedure? no  3.   Have you tested positive for COVID 19 since your procedure no  4.   Have you had any family members/close contacts diagnosed with the COVID 19 since your procedure?  no   If yes to any of these questions please route to Joylene John, RN and Alphonsa Gin, Therapist, sports.

## 2019-08-02 NOTE — Telephone Encounter (Signed)
No answer, left message to call back later today, B.Aubry Rankin RN. 

## 2019-08-03 ENCOUNTER — Other Ambulatory Visit: Payer: Self-pay

## 2019-08-04 ENCOUNTER — Encounter: Payer: Self-pay | Admitting: Gastroenterology

## 2019-08-04 MED ORDER — LIVALO 1 MG PO TABS: 1.0000 | ORAL_TABLET | Freq: Every day | ORAL | 0 refills | Status: DC

## 2019-08-08 ENCOUNTER — Other Ambulatory Visit: Payer: Self-pay | Admitting: Internal Medicine

## 2019-08-17 DIAGNOSIS — Z96642 Presence of left artificial hip joint: Secondary | ICD-10-CM | POA: Diagnosis not present

## 2019-08-17 DIAGNOSIS — M1612 Unilateral primary osteoarthritis, left hip: Secondary | ICD-10-CM | POA: Diagnosis not present

## 2019-08-17 DIAGNOSIS — Z471 Aftercare following joint replacement surgery: Secondary | ICD-10-CM | POA: Diagnosis not present

## 2019-09-02 DIAGNOSIS — M47812 Spondylosis without myelopathy or radiculopathy, cervical region: Secondary | ICD-10-CM | POA: Diagnosis not present

## 2019-09-06 ENCOUNTER — Emergency Department (HOSPITAL_COMMUNITY)
Admission: EM | Admit: 2019-09-06 | Discharge: 2019-09-06 | Discharge status: Against Medical Advice | Payer: Medicaid Other

## 2019-09-06 DIAGNOSIS — M79662 Pain in left lower leg: Secondary | ICD-10-CM | POA: Diagnosis not present

## 2019-09-06 DIAGNOSIS — M7989 Other specified soft tissue disorders: Secondary | ICD-10-CM | POA: Diagnosis not present

## 2019-09-06 DIAGNOSIS — M79605 Pain in left leg: Secondary | ICD-10-CM | POA: Diagnosis not present

## 2019-09-07 DIAGNOSIS — B9689 Other specified bacterial agents as the cause of diseases classified elsewhere: Secondary | ICD-10-CM | POA: Diagnosis not present

## 2019-09-07 DIAGNOSIS — M79605 Pain in left leg: Secondary | ICD-10-CM | POA: Diagnosis not present

## 2019-09-07 DIAGNOSIS — L02416 Cutaneous abscess of left lower limb: Secondary | ICD-10-CM | POA: Diagnosis not present

## 2019-09-07 DIAGNOSIS — I700 Atherosclerosis of aorta: Secondary | ICD-10-CM | POA: Diagnosis not present

## 2019-09-07 DIAGNOSIS — M7989 Other specified soft tissue disorders: Secondary | ICD-10-CM | POA: Diagnosis not present

## 2019-09-09 ENCOUNTER — Other Ambulatory Visit: Payer: Self-pay | Admitting: Family Medicine

## 2019-09-10 DIAGNOSIS — Z471 Aftercare following joint replacement surgery: Secondary | ICD-10-CM | POA: Diagnosis not present

## 2019-09-10 DIAGNOSIS — L02416 Cutaneous abscess of left lower limb: Secondary | ICD-10-CM | POA: Diagnosis not present

## 2019-09-10 DIAGNOSIS — Z96642 Presence of left artificial hip joint: Secondary | ICD-10-CM | POA: Diagnosis not present

## 2019-09-10 DIAGNOSIS — B9689 Other specified bacterial agents as the cause of diseases classified elsewhere: Secondary | ICD-10-CM | POA: Diagnosis not present

## 2019-09-11 DIAGNOSIS — E785 Hyperlipidemia, unspecified: Secondary | ICD-10-CM | POA: Diagnosis not present

## 2019-09-11 DIAGNOSIS — Z720 Tobacco use: Secondary | ICD-10-CM | POA: Diagnosis not present

## 2019-09-11 DIAGNOSIS — Z96642 Presence of left artificial hip joint: Secondary | ICD-10-CM | POA: Diagnosis not present

## 2019-09-11 DIAGNOSIS — L0291 Cutaneous abscess, unspecified: Secondary | ICD-10-CM | POA: Diagnosis not present

## 2019-09-12 DIAGNOSIS — E785 Hyperlipidemia, unspecified: Secondary | ICD-10-CM | POA: Diagnosis not present

## 2019-09-12 DIAGNOSIS — L7632 Postprocedural hematoma of skin and subcutaneous tissue following other procedure: Secondary | ICD-10-CM | POA: Diagnosis not present

## 2019-09-12 DIAGNOSIS — M96840 Postprocedural hematoma of a musculoskeletal structure following a musculoskeletal system procedure: Secondary | ICD-10-CM | POA: Diagnosis not present

## 2019-09-12 DIAGNOSIS — Z96642 Presence of left artificial hip joint: Secondary | ICD-10-CM | POA: Diagnosis not present

## 2019-09-12 DIAGNOSIS — I10 Essential (primary) hypertension: Secondary | ICD-10-CM | POA: Diagnosis not present

## 2019-09-12 DIAGNOSIS — J449 Chronic obstructive pulmonary disease, unspecified: Secondary | ICD-10-CM | POA: Diagnosis not present

## 2019-09-12 DIAGNOSIS — L0291 Cutaneous abscess, unspecified: Secondary | ICD-10-CM | POA: Diagnosis not present

## 2019-09-12 DIAGNOSIS — Z720 Tobacco use: Secondary | ICD-10-CM | POA: Diagnosis not present

## 2019-09-13 DIAGNOSIS — R519 Headache, unspecified: Secondary | ICD-10-CM | POA: Diagnosis not present

## 2019-09-13 DIAGNOSIS — E785 Hyperlipidemia, unspecified: Secondary | ICD-10-CM | POA: Diagnosis not present

## 2019-09-13 DIAGNOSIS — Z96642 Presence of left artificial hip joint: Secondary | ICD-10-CM | POA: Diagnosis not present

## 2019-09-13 DIAGNOSIS — S0990XA Unspecified injury of head, initial encounter: Secondary | ICD-10-CM | POA: Diagnosis not present

## 2019-09-13 DIAGNOSIS — L0291 Cutaneous abscess, unspecified: Secondary | ICD-10-CM | POA: Diagnosis not present

## 2019-09-13 DIAGNOSIS — Z720 Tobacco use: Secondary | ICD-10-CM | POA: Diagnosis not present

## 2019-09-14 DIAGNOSIS — L0291 Cutaneous abscess, unspecified: Secondary | ICD-10-CM | POA: Diagnosis not present

## 2019-09-14 DIAGNOSIS — Z720 Tobacco use: Secondary | ICD-10-CM | POA: Diagnosis not present

## 2019-09-14 DIAGNOSIS — Z96642 Presence of left artificial hip joint: Secondary | ICD-10-CM | POA: Diagnosis not present

## 2019-09-14 DIAGNOSIS — E785 Hyperlipidemia, unspecified: Secondary | ICD-10-CM | POA: Diagnosis not present

## 2019-10-11 ENCOUNTER — Ambulatory Visit: Payer: Medicaid Other | Admitting: Family Medicine

## 2019-10-12 ENCOUNTER — Other Ambulatory Visit: Payer: Self-pay

## 2019-10-12 ENCOUNTER — Encounter: Payer: Self-pay | Admitting: Family Medicine

## 2019-10-12 ENCOUNTER — Ambulatory Visit: Payer: Medicaid Other | Admitting: Family Medicine

## 2019-10-12 VITALS — BP 130/58 | HR 71 | Ht 61.0 in | Wt 137.1 lb

## 2019-10-12 DIAGNOSIS — M109 Gout, unspecified: Secondary | ICD-10-CM

## 2019-10-12 DIAGNOSIS — Z1231 Encounter for screening mammogram for malignant neoplasm of breast: Secondary | ICD-10-CM | POA: Diagnosis not present

## 2019-10-12 DIAGNOSIS — Z720 Tobacco use: Secondary | ICD-10-CM

## 2019-10-12 HISTORY — DX: Gout, unspecified: M10.9

## 2019-10-12 MED ORDER — LIVALO 1 MG PO TABS: 1.0000 | ORAL_TABLET | Freq: Every day | ORAL | 0 refills | Status: DC

## 2019-10-12 MED ORDER — COLCHICINE 0.6 MG PO CAPS: 1.0000 | ORAL_CAPSULE | Freq: Every day | ORAL | 6 refills | Status: DC

## 2019-10-12 MED ORDER — ALLOPURINOL 100 MG PO TABS: 100.0000 mg | ORAL_TABLET | Freq: Every day | ORAL | 6 refills | Status: DC

## 2019-10-12 NOTE — Patient Instructions (Addendum)
If you are having a flare, please call us or the [facility] to have an appointment to ensure it is gout.    Come back in 6 months or sooner as needed.   Gout  Gout is painful swelling of your joints. Gout is a type of arthritis. It is caused by having too much uric acid in your body. Uric acid is a chemical that is made when your body breaks down substances called purines. If your body has too much uric acid, sharp crystals can form and build up in your joints. This causes pain and swelling. Gout attacks can happen quickly and be very painful (acute gout). Over time, the attacks can affect more joints and happen more often (chronic gout). What are the causes?  Too much uric acid in your blood. This can happen because: ? Your kidneys do not remove enough uric acid from your blood. ? Your body makes too much uric acid. ? You eat too many foods that are high in purines. These foods include organ meats, some seafood, and beer.  Trauma or stress. What increases the risk?  Having a family history of gout.  Being female and middle-aged.  Being female and having gone through menopause.  Being very overweight (obese).  Drinking alcohol, especially beer.  Not having enough water in the body (being dehydrated).  Losing weight too quickly.  Having an organ transplant.  Having lead poisoning.  Taking certain medicines.  Having kidney disease.  Having a skin condition called psoriasis. What are the signs or symptoms? An attack of acute gout usually happens in just one joint. The most common place is the big toe. Attacks often start at night. Other joints that may be affected include joints of the feet, ankle, knee, fingers, wrist, or elbow. Symptoms of an attack may include:  Very bad pain.  Warmth.  Swelling.  Stiffness.  Shiny, red, or purple skin.  Tenderness. The affected joint may be very painful to touch.  Chills and fever. Chronic gout may cause symptoms  more often. More joints may be involved. You may also have white or yellow lumps (tophi) on your hands or feet or in other areas near your joints. How is this treated?  Treatment for this condition has two phases: treating an acute attack and preventing future attacks.  Acute gout treatment may include: ? NSAIDs. ? Steroids. These are taken by mouth or injected into a joint. ? Colchicine. This medicine relieves pain and swelling. It can be given by mouth or through an IV tube.  Preventive treatment may include: ? Taking small doses of NSAIDs or colchicine daily. ? Using a medicine that reduces uric acid levels in your blood. ? Making changes to your diet. You may need to see a food expert (dietitian) about what to eat and drink to prevent gout. Follow these instructions at home: During a gout attack   If told, put ice on the painful area: ? Put ice in a plastic bag. ? Place a towel between your skin and the bag. ? Leave the ice on for 20 minutes, 2-3 times a day.  Raise (elevate) the painful joint above the level of your heart as often as you can.  Rest the joint as much as possible. If the joint is in your leg, you may be given crutches.  Follow instructions from your doctor about what you cannot eat or drink. Avoiding future gout attacks  Eat a low-purine diet. Avoid foods and drinks such  as: ? Liver. ? Kidney. ? Anchovies. ? Asparagus. ? Herring. ? Mushrooms. ? Mussels. ? Beer.  Stay at a healthy weight. If you want to lose weight, talk with your doctor. Do not lose weight too fast.  Start or continue an exercise plan as told by your doctor. Eating and drinking  Drink enough fluids to keep your pee (urine) pale yellow.  If you drink alcohol: ? Limit how much you use to:  0-1 drink a day for women.  0-2 drinks a day for men. ? Be aware of how much alcohol is in your drink. In the [place] one drink equals one 12 oz bottle of beer (355 mL), one 5 oz glass of wine  (148 mL), or one 1 oz glass of hard liquor (44 mL). General instructions  Take over-the-counter and prescription medicines only as told by your doctor.  Do not drive or use heavy machinery while taking prescription pain medicine.  Return to your normal activities as told by your doctor. Ask your doctor what activities are safe for you.  Keep all follow-up visits as told by your doctor. This is important. Contact a doctor if:  You have another gout attack.  You still have symptoms of a gout attack after 10 days of treatment.  You have problems (side effects) because of your medicines.  You have chills or a fever.  You have burning pain when you pee (urinate).  You have pain in your lower back or belly. Get help right away if:  You have very bad pain.  Your pain cannot be controlled.  You cannot pee. Summary  Gout is painful swelling of the joints.  The most common site of pain is the big toe, but it can affect other joints.  Medicines and avoiding some foods can help to prevent and treat gout attacks. This information is not intended to replace advice given to you by your health care provider. Make sure you discuss any questions you have with your health care provider. Document Released: 08/26/2008 Document Revised: 06/09/2018 Document Reviewed: 06/09/2018 Elsevier Patient Education  [place] A low-purine eating plan involves making food choices to limit your intake of purine. Purine is a kind of uric acid. Too much uric acid in your blood can cause certain conditions, such as gout and kidney stones. Eating a low-purine diet can help control these conditions. What are tips for following this plan? Reading food labels   Avoid foods with saturated or Trans fat.  Check the ingredient list of grains-based foods, such as bread and cereal, to make sure that they contain whole grains.  Check the ingredient list of sauces or soups to  make sure they do not contain meat or fish.  When choosing soft drinks, check the ingredient list to make sure they do not contain high-fructose corn syrup. Shopping  Buy plenty of fresh fruits and vegetables.  Avoid buying canned or fresh fish.  Buy dairy products labeled as low-fat or nonfat.  Avoid buying premade or processed foods. These foods are often high in fat, salt (sodium), and added sugar. Cooking  Use olive oil instead of butter when cooking. Oils like olive oil, canola oil, and sunflower oil contain healthy fats. Meal planning  Learn which foods do or do not affect you. If you find out that a food tends to cause your gout symptoms to flare up, avoid eating that food. You can enjoy foods that do not cause problems.  If you have any questions about a food item, talk with your dietitian or health care provider.  Limit foods high in fat, especially saturated fat. Fat makes it harder for your body to get rid of uric acid.  Choose foods that are lower in fat and are lean sources of protein. General guidelines  Limit alcohol intake to no more than 1 drink a day for nonpregnant women and 2 drinks a day for men. One drink equals 12 oz of beer, 5 oz of wine, or 1 oz of hard liquor. Alcohol can affect the way your body gets rid of uric acid.  Drink plenty of water to keep your urine clear or pale yellow. Fluids can help remove uric acid from your body.  If directed by your health care provider, take a vitamin C supplement.  Work with your health care provider and dietitian to develop a plan to achieve or maintain a healthy weight. Losing weight can help reduce uric acid in your blood. What foods are recommended? The items listed may not be a complete list. Talk with your dietitian about what dietary choices are best for you. Foods low in purines Foods low in purines do not need to be limited. These include:  All fruits.  All low-purine vegetables, pickles, and  olives.  Breads, pasta, rice, cornbread, and popcorn. Cake and other baked goods.  All dairy foods.  Eggs, nuts, and nut butters.  Spices and condiments, such as salt, herbs, and vinegar.  Plant oils, butter, and margarine.  Water, sugar-free soft drinks, tea, coffee, and cocoa.  Vegetable-based soups, broths, sauces, and gravies. Foods moderate in purines Foods moderate in purines should be limited to the amounts listed.   cup of asparagus, cauliflower, spinach, mushrooms, or green peas, each day.  2/3 cup uncooked oatmeal, each day.   cup dry wheat bran or wheat germ, each day.  2-3 ounces of meat or poultry, each day.  4-6 ounces of shellfish, such as crab, lobster, oysters, or shrimp, each day.  1 cup cooked beans, peas, or lentils, each day.  Soup, broths, or bouillon made from meat or fish. Limit these foods as much as possible. What foods are not recommended? The items listed may not be a complete list. Talk with your dietitian about what dietary choices are best for you. Limit your intake of foods high in purines, including:  Beer and other alcohol.  Meat-based gravy or sauce.  Canned or fresh fish, such as: ? Anchovies, sardines, herring, and tuna. ? Mussels and scallops. ? Codfish, trout, and haddock.  Berniece Salines.  Organ meats, such as: ? Liver or kidney. ? Tripe. ? Sweetbreads (thymus gland or pancreas).  [employer] biochemist.  Yeast or yeast extract supplements.  Drinks sweetened with high-fructose corn syrup. Summary  Eating a low-purine diet can help control conditions caused by too much uric acid in the body, such as gout or kidney stones.  Choose low-purine foods, limit alcohol, and limit foods high in fat.  You will learn over time which foods do or do not affect you. If you find out that a food tends to cause your gout symptoms to flare up, avoid eating that food. This information is not intended to replace advice given to you by your health  care provider. Make sure you discuss any questions you have with your health care provider. Document Released: 03/14/2011 Document Revised: 10/30/2017 Document Reviewed: 12/31/2016 Elsevier Patient Education  2020 [employer].

## 2019-10-12 NOTE — Assessment & Plan Note (Signed)
Discussed with patient current smoking habit and encourage cessation.  Advised that when she is ready, she can follow-up here to discuss this further and for further assistance.

## 2019-10-12 NOTE — Assessment & Plan Note (Addendum)
Patient symptoms described patient, it does sound that she has gout and that she is currently having a resolving flare.  It is difficult to make this diagnosis without seeing the patient during a flare, as told to patient and her significant other who presents with her.  Per their request, will check uric acid level today.  Given that she has been on medications for gout, did explain that a normal value would likely not rule out gout.  Discussed low purine diet to avoid gouty flares and opted to prescribe patient allopurinol and colchicine for treatment of gout as this is presumed diagnosis.  Did encourage patient to come back here or to [facility] medicine center during a flare so that she can be appropriately examined and consider ultrasound to assess for crystals.  No recent BMPs, therefore we will also check kidney function.

## 2019-10-12 NOTE — Progress Notes (Signed)
Subjective: Chief Complaint  Patient presents with  . Medication Management     HPI: Diana [name] is a 63 y.o. presenting to clinic today to discuss the following:  1 Gout Was given colchicine by her surgeon because she had swelling in her left knee from the knee down.  Surgeon told here to come here for allopurinol.  Doesn't think that uric acid level was checked.  Has been on it since she was in  [facility], while hospitalized for spinal fusion and left hip replacement.  Then had infection in left hip and went back into the hospital.  Prior to going back to the hospital for this infection, she had significant swelling in her left lower extremity, she has a picture of which she has pitting edema of the entirety of her left lower extremity.  While she is in the hospital, she had debridement of the wound and improvement in her swelling.  Also noted that she was having increased pain in her left great toe and was diagnosed with gout by her surgeon.  Notes that pain has overall improved with tart cherry juice and uric acid homeopathic supprt.  Notes that she still has some pain in the top of her left foot and her big toe.  Thinks that she had an acute flare about 2 days ago, but has not had swelling or redness in her left great toe since then.  When the pain hits, "the big toe feels like it is going to explode."  When it happens they get red, hot, and swollen.  Has had improvement with the medications.    2 tobacco abuse Patient reports that she is currently smoking 1 pack/day.  Reports that she is no longer on Chantix.  States that she would like to quit in the future, but not sure if she wants to start now.  Health Maintenance: Due for mammogram     ROS noted in HPI. Chief complaint noted.  Other Pertinent PMH: Hypertension, COPD, tobacco abuse, CKD 2, HLD Past Medical, Surgical, Social, and Family History Reviewed & Updated per EMR.      Social History   Tobacco Use   Smoking Status Current Every Day Smoker  . Packs/day: 0.50  . Years: 32.00  . Pack years: 16.00  . Types: Cigarettes  Smokeless Tobacco Never Used  Tobacco Comment   on chanix   Smoking status noted.  Still smoking, about 1 ppd    Objective: BP (!) 130/58   Pulse 71   Ht 5\' 1"  (1.549 m)   Wt 137 lb 2 oz (62.2 kg)   SpO2 99%   BMI 25.91 kg/m  Vitals and nursing notes reviewed  Physical Exam:  General: 63 y.o. female in NAD Cardio: 2+ dorsalis pedis pulses bilaterally Lungs: Breathing comfortably on room air Skin: warm and dry Left foot: No bony deformity, erythema, swelling, ecchymosis noted on bilateral feet.  Patient does note mild tenderness to palpation of first MTP on left and states this is location of her pain.  Pain with range of motion of first MTP.  No results found for this or any previous visit (from the past 72 hour(s)).  Assessment/Plan:  Acute gout involving toe of left foot Patient symptoms described patient, it does sound that she has gout and that she is currently having a resolving flare.  It is difficult to make this diagnosis without seeing the patient during a flare, as told to patient and her significant other who  presents with her.  Per their request, will check uric acid level today.  Given that she has been on medications for gout, did explain that a normal value would likely not rule out gout.  Discussed low purine diet to avoid gouty flares and opted to prescribe patient allopurinol and colchicine for treatment of gout as this is presumed diagnosis.  Did encourage patient to come back here or to sports medicine center during a flare so that she can be appropriately examined and consider ultrasound to assess for crystals.  No recent BMPs, therefore we will also check kidney function.  Tobacco abuse Discussed with patient current smoking habit and encourage cessation.  Advised that when she is ready, she can follow-up here to discuss this further and  for further assistance.     PATIENT EDUCATION PROVIDED: See AVS    Diagnosis and plan along with any newly prescribed medication(s) were discussed in detail with this patient today. The patient verbalized understanding and agreed with the plan. Patient advised if symptoms worsen return to clinic or ER.   Health Maintainance: Patient given written prescription for mammogram if she would like to have this performed at [facility] This Encounter  Procedures  . MM DIGITAL SCREENING BILATERAL    Standing Status:   Future    Standing Expiration Date:   12/11/2020    Order Specific Question:   Reason for Exam (SYMPTOM  OR DIAGNOSIS REQUIRED)    Answer:   breast cancer screeening    Order Specific Question:   Preferred imaging location?    Answer:   External  . Basic Metabolic Panel  . Uric Acid    Meds ordered this encounter  Medications  . allopurinol (ZYLOPRIM) 100 MG tablet    Sig: Take 1 tablet (100 mg total) by mouth daily.    Dispense:  30 tablet    Refill:  6  . Colchicine 0.6 MG CAPS    Sig: Take 1 capsule by mouth daily.    Dispense:  30 capsule    Refill:  6  . Pitavastatin Calcium (LIVALO) 1 MG TABS    Sig: Take 1 tablet (1 mg total) by mouth at bedtime.    Dispense:  30 tablet    Refill:  [place], DO 10/12/2019, 10:04 AM PGY-2 [facility]

## 2019-10-13 ENCOUNTER — Other Ambulatory Visit: Payer: Self-pay | Admitting: Family Medicine

## 2019-10-13 DIAGNOSIS — E875 Hyperkalemia: Secondary | ICD-10-CM

## 2019-10-13 LAB — URIC ACID: Uric Acid: 4.8 mg/dL (ref 2.5–7.1)

## 2019-10-13 LAB — BASIC METABOLIC PANEL
BUN/Creatinine Ratio: 8 — ABNORMAL LOW (ref 12–28)
BUN: 7 mg/dL — ABNORMAL LOW (ref 8–27)
CO2: 24 mmol/L (ref 20–29)
Calcium: 9.9 mg/dL (ref 8.7–10.3)
Chloride: 99 mmol/L (ref 96–106)
Creatinine, Ser: 0.93 mg/dL (ref 0.57–1.00)
GFR calc Af Amer: 77 mL/min/{1.73_m2} (ref >59)
GFR calc non Af Amer: 67 mL/min/{1.73_m2} (ref >59)
Glucose: 83 mg/dL (ref 65–99)
Potassium: 5.4 mmol/L — ABNORMAL HIGH (ref 3.5–5.2)
Sodium: 138 mmol/L (ref 134–144)

## 2019-10-13 NOTE — Progress Notes (Signed)
Order for repeat K in 1 week.

## 2019-10-19 DIAGNOSIS — M5414 Radiculopathy, thoracic region: Secondary | ICD-10-CM | POA: Diagnosis not present

## 2019-10-19 DIAGNOSIS — M5418 Radiculopathy, sacral and sacrococcygeal region: Secondary | ICD-10-CM | POA: Diagnosis not present

## 2019-10-19 DIAGNOSIS — M9902 Segmental and somatic dysfunction of thoracic region: Secondary | ICD-10-CM | POA: Diagnosis not present

## 2019-10-19 DIAGNOSIS — M9903 Segmental and somatic dysfunction of lumbar region: Secondary | ICD-10-CM | POA: Diagnosis not present

## 2019-10-19 DIAGNOSIS — M9904 Segmental and somatic dysfunction of sacral region: Secondary | ICD-10-CM | POA: Diagnosis not present

## 2019-10-19 DIAGNOSIS — M5416 Radiculopathy, lumbar region: Secondary | ICD-10-CM | POA: Diagnosis not present

## 2019-10-20 ENCOUNTER — Other Ambulatory Visit: Payer: Medicaid Other

## 2019-10-20 ENCOUNTER — Other Ambulatory Visit: Payer: Self-pay

## 2019-10-20 DIAGNOSIS — E875 Hyperkalemia: Secondary | ICD-10-CM

## 2019-10-21 LAB — POTASSIUM: Potassium: 4.5 mmol/L (ref 3.5–5.2)

## 2019-10-24 DIAGNOSIS — M5416 Radiculopathy, lumbar region: Secondary | ICD-10-CM | POA: Diagnosis not present

## 2019-10-24 DIAGNOSIS — M5418 Radiculopathy, sacral and sacrococcygeal region: Secondary | ICD-10-CM | POA: Diagnosis not present

## 2019-10-24 DIAGNOSIS — M9904 Segmental and somatic dysfunction of sacral region: Secondary | ICD-10-CM | POA: Diagnosis not present

## 2019-10-24 DIAGNOSIS — M9903 Segmental and somatic dysfunction of lumbar region: Secondary | ICD-10-CM | POA: Diagnosis not present

## 2019-10-24 DIAGNOSIS — M5414 Radiculopathy, thoracic region: Secondary | ICD-10-CM | POA: Diagnosis not present

## 2019-10-24 DIAGNOSIS — M9902 Segmental and somatic dysfunction of thoracic region: Secondary | ICD-10-CM | POA: Diagnosis not present

## 2019-10-26 ENCOUNTER — Other Ambulatory Visit: Payer: Self-pay | Admitting: Family Medicine

## 2019-10-29 ENCOUNTER — Other Ambulatory Visit: Payer: Self-pay | Admitting: Family Medicine

## 2019-10-31 ENCOUNTER — Other Ambulatory Visit: Payer: Self-pay | Admitting: *Deleted

## 2019-10-31 DIAGNOSIS — M5416 Radiculopathy, lumbar region: Secondary | ICD-10-CM | POA: Diagnosis not present

## 2019-10-31 DIAGNOSIS — M5414 Radiculopathy, thoracic region: Secondary | ICD-10-CM | POA: Diagnosis not present

## 2019-10-31 DIAGNOSIS — M9903 Segmental and somatic dysfunction of lumbar region: Secondary | ICD-10-CM | POA: Diagnosis not present

## 2019-10-31 DIAGNOSIS — M9902 Segmental and somatic dysfunction of thoracic region: Secondary | ICD-10-CM | POA: Diagnosis not present

## 2019-10-31 DIAGNOSIS — M5418 Radiculopathy, sacral and sacrococcygeal region: Secondary | ICD-10-CM | POA: Diagnosis not present

## 2019-10-31 DIAGNOSIS — M9904 Segmental and somatic dysfunction of sacral region: Secondary | ICD-10-CM | POA: Diagnosis not present

## 2019-11-01 MED ORDER — ESOMEPRAZOLE MAGNESIUM 40 MG PO CPDR: 40.0000 mg | DELAYED_RELEASE_CAPSULE | Freq: Every day | ORAL | 1 refills | Status: DC

## 2019-11-01 NOTE — Telephone Encounter (Signed)
Pt is calling for status update on her medication request. She is out of several and the pharmacy has been calling also jw

## 2019-11-01 NOTE — Telephone Encounter (Signed)
Pt calling to check status. Lala Been, CMA  

## 2019-11-01 NOTE — Telephone Encounter (Signed)
Pt calling to check status. Davide Risdon, CMA  

## 2019-11-01 NOTE — Telephone Encounter (Signed)
Pt calling to check status. Jessica Fleeger, CMA  

## 2019-11-08 ENCOUNTER — Ambulatory Visit: Payer: Medicaid Other | Admitting: Sports Medicine

## 2019-11-08 ENCOUNTER — Other Ambulatory Visit: Payer: Self-pay

## 2019-11-08 VITALS — BP 134/78 | Ht 61.0 in | Wt 136.0 lb

## 2019-11-08 DIAGNOSIS — M25552 Pain in left hip: Secondary | ICD-10-CM

## 2019-11-08 NOTE — Patient Instructions (Signed)
You can get your x-ray during their open hours from 8 am to 4:30 pm, you don't need an appt.  You were given a handout with their address. Head up to the [facility] to get your bloodwork done. You don't need an appt for this. They are located at [address] are scheduled for the bone scan on 11/15/2019 the first part is at 12 pm, the second part is at 3 pm. Please arrive at 11:30 am for this. This is at [name] at the main entrance and they will direct you from there.

## 2019-11-08 NOTE — Progress Notes (Signed)
[facility] [address] [place], [place] [address] Phone: [phone] Fax: [phone]   Patient Name: Diana [name] Date of Birth: [birth year] Medical Record Number: [id number] Gender: female Date of Encounter: 11/08/2019  SUBJECTIVE:      Chief Complaint:  Left groin pain   HPI:  [name] is a 63 year old female presenting with 5 months of left groin pain. She has been suffering with left groin pain for quite some time, and 5 months ago had a left THA. Unfortunately, the groin pain persisted. She also had a infection to her incision and had a second procedure to address the infection. Since that time she continues to have groin pain. She is unable to fully weight-bear and has to use a cane when ambulating. She had her right hip replaced 17 years ago with no complications. He has not done any physical therapy or exercise since surgery. She states the surgeon is recommending to give it more time before she notices a difference. sHe denies any fevers, rapid weight loss, erythema, skin changes. There is associated lateral thigh numbness. SHe has not felt like one leg is longer than the other.    ROS:     See HPI.   PERTINENT  PMH / PSH / FH / SH:  Past Medical, Surgical, Social, and Family History Reviewed & Updated in the EMR. Pertinent findings include:  Current everyday smoker, osteoporotic bone loss, restless leg syndrome, L4-L5 spinal fusion in 1998   OBJECTIVE:  BP 134/78   Ht _0  (1.549 m)   Wt 136 lb (61.7 kg)   BMI 25.70 kg/m  Physical Exam:  Vital signs are reviewed.   GEN: Alert and oriented, NAD Pulm: Breathing unlabored PSY: normal mood, congruent affect  MSK: Right hip: ROM IR: 45 Deg, ER: 45 Deg, Flexion: 120 Deg, Extension: 100 Deg, Abduction: 45 Deg, Adduction: 45 Deg Strength IR: 5/5, ER: 5/5, Flexion: 5/5, Extension: 5/5, Abduction: 5/5, Adduction: 5/5 Pelvic alignment unremarkable to inspection and palpation. Standing  hip rotation and gait without trendelenburg sign / unsteadiness. Greater trochanter without tenderness to palpation. No tenderness over piriformis. No pain with FABER or FADIR. No SI joint tenderness and normal minimal SI movement.  Left hip: No swelling or erythema to anterior lateral hip ROM Decreased ROM in hip flexion and internal rotation Strength IR:3+/5, ER: 4/5, Flexion: 3+/5, Extension: 4/5, Abduction: 3+/5, Adduction: 3+/5 Pelvic alignment unremarkable to inspection and palpation. Standing hip rotation and gait without trendelenburg sign / unsteadiness. Greater trochanter TTP No tenderness over piriformis. Pain with FABER or FADIR. Positive logroll No SI joint tenderness  NVI  ASSESSMENT & PLAN:   1. Left groin pain  Given chronicity of symptoms and prior surgical history, we will order a XR to look for any clear space or sign of infection. We will move forward with a bone scan to rule out any postoperative infections or mechanical disruptions in hardware. We have also ordered a CBC, CRP, and ESR to evaluate for infectious etiology. Continue to use cane as needed. She will follow-up after all results are back to discuss further prognosis.   Lanier Clam, DO, ATC Sports Medicine Fellow  Patient seen and evaluated with the sports medicine fellow.  I agree with the above plan of care.  Patient did extremely well status post right total hip arthroplasty many years ago but has continued to struggle postoperatively with her left hip.  Pain continues to be centered in the groin area  with logrolling.  I do not have any of her records available for review so I will request those from her orthopedic surgeon in [place].  In the meantime, I would like to get an x-ray as well as a bone scan of her left hip.  We will also order a CBC, CRP, and sed rate to rule out infectious etiology.  We will delineate further work-up and treatment based on my review of her records, lab results, and  imaging results.

## 2019-11-15 ENCOUNTER — Encounter (HOSPITAL_COMMUNITY)
Admission: RE | Admit: 2019-11-15 | Discharge: 2019-11-15 | Disposition: A | Discharge status: Home / Self Care | Payer: Medicaid Other | Source: Ambulatory Visit | Attending: Sports Medicine | Admitting: Sports Medicine

## 2019-11-15 ENCOUNTER — Ambulatory Visit
Admission: RE | Admit: 2019-11-15 | Discharge: 2019-11-15 | Disposition: A | Discharge status: Home / Self Care | Payer: Medicaid Other | Source: Ambulatory Visit | Attending: Sports Medicine | Admitting: Sports Medicine

## 2019-11-15 ENCOUNTER — Other Ambulatory Visit: Payer: Self-pay

## 2019-11-15 DIAGNOSIS — M25552 Pain in left hip: Secondary | ICD-10-CM | POA: Insufficient documentation

## 2019-11-15 DIAGNOSIS — Z96642 Presence of left artificial hip joint: Secondary | ICD-10-CM | POA: Diagnosis not present

## 2019-11-15 DIAGNOSIS — Z471 Aftercare following joint replacement surgery: Secondary | ICD-10-CM | POA: Diagnosis not present

## 2019-11-15 MED ORDER — TECHNETIUM TC 99M MEDRONATE IV KIT
20.0000 | PACK | Freq: Once | INTRAVENOUS | Status: AC | PRN
  Administered 2019-11-15: 20 via INTRAVENOUS

## 2019-11-15 NOTE — Addendum Note (Signed)
Addended by: Jolinda Croak E on: 11/15/2019 10:48 AM   Modules accepted: Orders

## 2019-11-15 NOTE — Addendum Note (Signed)
Addended by: Calvert Cantor on: 11/15/2019 03:01 PM   Modules accepted: Orders

## 2019-11-16 LAB — CBC
Hematocrit: 38.9 % (ref 34.0–46.6)
Hemoglobin: 12.9 g/dL (ref 11.1–15.9)
MCH: 27.8 pg (ref 26.6–33.0)
MCHC: 33.2 g/dL (ref 31.5–35.7)
MCV: 84 fL (ref 79–97)
Platelets: 227 10*3/uL (ref 150–450)
RBC: 4.64 x10E6/uL (ref 3.77–5.28)
RDW: 12.7 % (ref 11.7–15.4)
WBC: 6 10*3/uL (ref 3.4–10.8)

## 2019-11-16 LAB — C-REACTIVE PROTEIN: CRP: 1 mg/L (ref 0–10)

## 2019-11-16 LAB — SEDIMENTATION RATE: Sed Rate: 30 mm/hr (ref 0–40)

## 2019-11-18 ENCOUNTER — Telehealth: Payer: Self-pay | Admitting: Sports Medicine

## 2019-11-18 ENCOUNTER — Other Ambulatory Visit: Payer: Self-pay

## 2019-11-18 DIAGNOSIS — M25552 Pain in left hip: Secondary | ICD-10-CM

## 2019-11-18 NOTE — Telephone Encounter (Signed)
  I spoke with the patient on the phone today after reviewing her blood work, x-rays, and triple phase bone scan.  Although her blood work is normal and her x-rays are unremarkable, her triple phase bone scan is abnormal.  She does have asymmetric increased uptake in all 3 phases which may reflect arthroplasty device loosening or infection.  Based on these findings I recommended consultation with Dr. Mayer Camel to discuss further work-up and treatment.  Patient agrees with this plan.  I will defer further work-up and treatment to the discretion of Dr. Mayer Camel.  Patient will follow up with me as needed.

## 2019-11-29 DIAGNOSIS — H6691 Otitis media, unspecified, right ear: Secondary | ICD-10-CM | POA: Diagnosis not present

## 2019-12-13 ENCOUNTER — Other Ambulatory Visit: Payer: Self-pay | Admitting: Family Medicine

## 2019-12-13 DIAGNOSIS — M25552 Pain in left hip: Secondary | ICD-10-CM | POA: Diagnosis not present

## 2019-12-23 DIAGNOSIS — L7632 Postprocedural hematoma of skin and subcutaneous tissue following other procedure: Secondary | ICD-10-CM | POA: Diagnosis not present

## 2019-12-28 DIAGNOSIS — M25552 Pain in left hip: Secondary | ICD-10-CM | POA: Diagnosis not present

## 2019-12-28 DIAGNOSIS — R2689 Other abnormalities of gait and mobility: Secondary | ICD-10-CM | POA: Diagnosis not present

## 2019-12-28 DIAGNOSIS — M25652 Stiffness of left hip, not elsewhere classified: Secondary | ICD-10-CM | POA: Diagnosis not present

## 2020-01-03 DIAGNOSIS — R2689 Other abnormalities of gait and mobility: Secondary | ICD-10-CM | POA: Diagnosis not present

## 2020-01-03 DIAGNOSIS — M25652 Stiffness of left hip, not elsewhere classified: Secondary | ICD-10-CM | POA: Diagnosis not present

## 2020-01-03 DIAGNOSIS — M25552 Pain in left hip: Secondary | ICD-10-CM | POA: Diagnosis not present

## 2020-01-12 DIAGNOSIS — H35342 Macular cyst, hole, or pseudohole, left eye: Secondary | ICD-10-CM | POA: Diagnosis not present

## 2020-01-12 DIAGNOSIS — H35371 Puckering of macula, right eye: Secondary | ICD-10-CM | POA: Diagnosis not present

## 2020-01-12 DIAGNOSIS — H34212 Partial retinal artery occlusion, left eye: Secondary | ICD-10-CM | POA: Diagnosis not present

## 2020-01-13 ENCOUNTER — Other Ambulatory Visit: Payer: Self-pay

## 2020-01-13 ENCOUNTER — Encounter: Payer: Self-pay | Admitting: Family Medicine

## 2020-01-13 ENCOUNTER — Ambulatory Visit: Payer: Medicaid Other | Admitting: Family Medicine

## 2020-01-13 ENCOUNTER — Ambulatory Visit (HOSPITAL_COMMUNITY)
Admission: RE | Admit: 2020-01-13 | Discharge: 2020-01-13 | Disposition: A | Discharge status: Home / Self Care | Payer: Medicaid Other | Source: Ambulatory Visit | Attending: Family Medicine | Admitting: Family Medicine

## 2020-01-13 VITALS — BP 140/80 | HR 84 | Wt 140.4 lb

## 2020-01-13 DIAGNOSIS — E785 Hyperlipidemia, unspecified: Secondary | ICD-10-CM

## 2020-01-13 DIAGNOSIS — H04123 Dry eye syndrome of bilateral lacrimal glands: Secondary | ICD-10-CM | POA: Diagnosis not present

## 2020-01-13 DIAGNOSIS — Z01810 Encounter for preprocedural cardiovascular examination: Secondary | ICD-10-CM | POA: Diagnosis not present

## 2020-01-13 DIAGNOSIS — H34212 Partial retinal artery occlusion, left eye: Secondary | ICD-10-CM | POA: Diagnosis not present

## 2020-01-13 DIAGNOSIS — R42 Dizziness and giddiness: Secondary | ICD-10-CM | POA: Diagnosis not present

## 2020-01-13 DIAGNOSIS — Z720 Tobacco use: Secondary | ICD-10-CM | POA: Diagnosis not present

## 2020-01-13 MED ORDER — NICOTINE POLACRILEX 4 MG MT GUM: 4.0000 mg | CHEWING_GUM | OROMUCOSAL | 3 refills | Status: DC | PRN

## 2020-01-13 MED ORDER — RESTASIS 0.05 % OP EMUL: 1.0000 [drp] | Freq: Two times a day (BID) | OPHTHALMIC | 3 refills | Status: DC

## 2020-01-13 NOTE — Progress Notes (Signed)
CHIEF COMPLAINT / HPI:  Surgical Clearance and Hollenh[place]st Plaque W/U Patient presents today for surgical clearance for known Macular Hole OS.  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  Records were faxed over yesterday by ophthalmologist.  They have requested that she receive a CBC with differential/platelets, lipid panel, fasting glucose, ESR, CRP, PT/INR, PTT, complete metabolic panel, carotid ultrasound, EKG, echo, Zio patch for 7 to 14 days.  Patient reports 1 month ago she had sudden onset blurry vision in her left eye and has been noticing decreased vision since then, which prompted her to have an eye exam.  She has not had an focal weakness, numbness, or tingling.  No CP or SOB.  Has been experiencing some dizziness, see problem below.  HLD She is on Livalo and tolerating it well.  No chest pain or SOB.  Tobacco Abuse Still smoking, about 1 ppd, sometimes less.  Smokes first within 30 min of waking.  Is interested in quitting and would like to explore using the gum.  Dizziness Dizziness when she lays down and then gets back up.  Also hits her intermittently throughout the day where she has this feeling of dizziness when walking, bending over and coming back up.  No dizziness when sitting.  Has dizziness with head movement in bed.  Feels steady on her feet when walking.  Sometimes has waves of dizziness when walking.  Has had this for a few months.   Dry eye syndrome Patient reports that she was diagnosed with dry eyes as well by her ophthalmologist.  States that they had sent a prescription for Restasis, but they are "out of network" and she is unable to pick this up.  She is requesting for me to write a prescription for Restasis.  She reports that she has been using over-the-counter dry eyedrops multiple times daily.  PERTINENT  PMH / PSH: Hollenhorst plaque OS, hypertension, COPD, CKD stage II, hyperlipidemia, tobacco use, anxiety and  depression   OBJECTIVE: BP 140/80   Pulse 84   Wt 140 lb 6.4 oz (63.7 kg)   SpO2 98%   BMI 26.53 kg/m    Physical Exam:  General: 64 y.o. female in NAD HEENT: PERRL, NCAT Cardio: RRR no m/r/g, 2+ radial pulses, 2+ dorsalis pedis pulses bilaterally Lungs: CTAB, no wheezing, no rhonchi, no crackles, no IWOB on RA Skin: warm and dry Extremities: No edema   ASSESSMENT / PLAN:  Hollenhorst plaque, left eye Work-up per recommendations from ophthalmology.  CBC with differential, lipid panel, fasting glucose, ESR, CRP, PT/INR, PTT, CMP, carotid ultrasound, EKG, echo, Zio patch for 7 to 14 days.  Patient will need referral to cardiology for Zio patch, therefore order placed.  She was scheduled for an echo and carotid ultrasound before leaving.  EKG in office without acute changes or obvious arrhythmia.  Once work-up has been completed, will fax records back to ophthalmologist.  She is currently on a statin, will see if any further changes should be made to this regimen following work-up.  See tobacco abuse plan as well.  Tobacco abuse Discussed cessation.  She does not have a quit date at this time as this makes her anxious.  She did agree to nicotine gum therapy.  Rx sent for Nicorette 4 mg.  Given a schedule to follow to hopefully decrease her smoking use.  We will continue to monitor at follow-up visits.  Hyperlipidemia Will collect lipid panel.  Patient is currently on  Livalo.  Will adjust as needed.  Also encourage smoking cessation.  Dizziness Given timeline, this could be consistent with TIA versus a previous stroke given that she has a known Hollenhorst plaque in her left eye.  She does however note worsening symptoms with changes in position, which lends more to an inner ear dysfunction or orthostatic hypotension.  Discussed need for proper hydration.  Performing full work-up for Hollenhorst plaque per above.  Will make changes to regimen as needed.  At this time do not think that she  warrants a CT or MRI as this has been going on for a few months and would not change current management.  Dry eye syndrome of both eyes Rx sent for Restasis OU.     Cleophas Dunker, [facility]

## 2020-01-13 NOTE — Patient Instructions (Signed)
Thank you for coming to see me today. It was a pleasure. Today we talked about:   I have placed a referral to Cardiology If you do not hear from them in the next 2 weeks, please give [place] a call.  We will get labs and get you scheduled for a carotid ultrasound and an echo of your heart.  Call 1800-QUIT-NOW for help with stopping smoking. They can assist with free resources such as patches, check-in calls, and counseling.  I have sent gum to the pharmacy for you, use the following plan:  Nicorette Original Gum 4mg   1 piece every 1-2 hours  1 piece every 2-4 hours  1 piece every 4-8 hours  Please follow-up with me in 1 month or sooner as needed.  If you have any questions or concerns, please do not hesitate to call the office at [phone].  Best,   [place] Constable, DO

## 2020-01-14 LAB — COMPREHENSIVE METABOLIC PANEL
ALT: 15 IU/L (ref 0–32)
AST: 20 IU/L (ref 0–40)
Albumin/Globulin Ratio: 1.9 (ref 1.2–2.2)
Albumin: 4.4 g/dL (ref 3.8–4.8)
Alkaline Phosphatase: 103 IU/L (ref 39–117)
BUN/Creatinine Ratio: 8 — ABNORMAL LOW (ref 12–28)
BUN: 6 mg/dL — ABNORMAL LOW (ref 8–27)
Bilirubin Total: 0.3 mg/dL (ref 0.0–1.2)
CO2: 25 mmol/L (ref 20–29)
Calcium: 9.6 mg/dL (ref 8.7–10.3)
Chloride: 100 mmol/L (ref 96–106)
Creatinine, Ser: 0.71 mg/dL (ref 0.57–1.00)
GFR calc Af Amer: 106 mL/min/{1.73_m2} (ref >59)
GFR calc non Af Amer: 92 mL/min/{1.73_m2} (ref >59)
Globulin, Total: 2.3 g/dL (ref 1.5–4.5)
Glucose: 76 mg/dL (ref 65–99)
Potassium: 4.4 mmol/L (ref 3.5–5.2)
Sodium: 138 mmol/L (ref 134–144)
Total Protein: 6.7 g/dL (ref 6.0–8.5)

## 2020-01-14 LAB — LIPID PANEL
Chol/HDL Ratio: 4.2 ratio (ref 0.0–4.4)
Cholesterol, Total: 252 mg/dL — ABNORMAL HIGH (ref 100–199)
HDL: 60 mg/dL (ref >39)
LDL Chol Calc (NIH): 165 mg/dL — ABNORMAL HIGH (ref 0–99)
Triglycerides: 150 mg/dL — ABNORMAL HIGH (ref 0–149)
VLDL Cholesterol Cal: 27 mg/dL (ref 5–40)

## 2020-01-14 LAB — APTT: aPTT: 24 s (ref 24–33)

## 2020-01-14 LAB — CBC WITH DIFFERENTIAL
Basophils Absolute: 0.1 10*3/uL (ref 0.0–0.2)
Basos: 1 %
EOS (ABSOLUTE): 0.2 10*3/uL (ref 0.0–0.4)
Eos: 3 %
Hematocrit: 38.2 % (ref 34.0–46.6)
Hemoglobin: 12.8 g/dL (ref 11.1–15.9)
Immature Grans (Abs): 0 10*3/uL (ref 0.0–0.1)
Immature Granulocytes: 0 %
Lymphocytes Absolute: 2.6 10*3/uL (ref 0.7–3.1)
Lymphs: 41 %
MCH: 28 pg (ref 26.6–33.0)
MCHC: 33.5 g/dL (ref 31.5–35.7)
MCV: 84 fL (ref 79–97)
Monocytes Absolute: 0.4 10*3/uL (ref 0.1–0.9)
Monocytes: 6 %
Neutrophils Absolute: 3 10*3/uL (ref 1.4–7.0)
Neutrophils: 49 %
RBC: 4.57 x10E6/uL (ref 3.77–5.28)
RDW: 14.7 % (ref 11.7–15.4)
WBC: 6.3 10*3/uL (ref 3.4–10.8)

## 2020-01-14 LAB — C-REACTIVE PROTEIN: CRP: 1 mg/L (ref 0–10)

## 2020-01-14 LAB — SEDIMENTATION RATE: Sed Rate: 16 mm/hr (ref 0–40)

## 2020-01-14 LAB — PROTIME-INR
INR: 0.9 (ref 0.9–1.2)
Prothrombin Time: 9.8 s (ref 9.1–12.0)

## 2020-01-15 DIAGNOSIS — H04123 Dry eye syndrome of bilateral lacrimal glands: Secondary | ICD-10-CM | POA: Insufficient documentation

## 2020-01-15 DIAGNOSIS — R42 Dizziness and giddiness: Secondary | ICD-10-CM | POA: Insufficient documentation

## 2020-01-15 NOTE — Assessment & Plan Note (Signed)
Discussed cessation.  She does not have a quit date at this time as this makes her anxious.  She did agree to nicotine gum therapy.  Rx sent for Nicorette 4 mg.  Given a schedule to follow to hopefully decrease her smoking use.  We will continue to monitor at follow-up visits.

## 2020-01-15 NOTE — Assessment & Plan Note (Signed)
Will collect lipid panel.  Patient is currently on Livalo.  Will adjust as needed.  Also encourage smoking cessation.

## 2020-01-15 NOTE — Assessment & Plan Note (Signed)
Rx sent for Restasis OU.

## 2020-01-15 NOTE — Assessment & Plan Note (Signed)
Given timeline, this could be consistent with TIA versus a previous stroke given that she has a known Hollenhorst plaque in her left eye.  She does however note worsening symptoms with changes in position, which lends more to an inner ear dysfunction or orthostatic hypotension.  Discussed need for proper hydration.  Performing full work-up for Hollenhorst plaque per above.  Will make changes to regimen as needed.  At this time do not think that she warrants a CT or MRI as this has been going on for a few months and would not change current management.

## 2020-01-15 NOTE — Assessment & Plan Note (Addendum)
Work-up per recommendations from ophthalmology.  CBC with differential, lipid panel, fasting glucose, ESR, CRP, PT/INR, PTT, CMP, carotid ultrasound, EKG, echo, Zio patch for 7 to 14 days.  Patient will need referral to cardiology for Zio patch, therefore order placed.  She was scheduled for an echo and carotid ultrasound before leaving.  EKG in office without acute changes or obvious arrhythmia.  Once work-up has been completed, will fax records back to ophthalmologist.  She is currently on a statin, will see if any further changes should be made to this regimen following work-up.  See tobacco abuse plan as well.

## 2020-01-16 ENCOUNTER — Other Ambulatory Visit: Payer: Self-pay | Admitting: Family Medicine

## 2020-01-16 DIAGNOSIS — E782 Mixed hyperlipidemia: Secondary | ICD-10-CM

## 2020-01-16 MED ORDER — LIVALO 4 MG PO TABS: 1.0000 | ORAL_TABLET | Freq: Every day | ORAL | 3 refills | Status: DC

## 2020-01-17 ENCOUNTER — Ambulatory Visit (INDEPENDENT_AMBULATORY_CARE_PROVIDER_SITE_OTHER): Payer: Medicaid Other

## 2020-01-17 ENCOUNTER — Encounter: Payer: Self-pay | Admitting: Cardiology

## 2020-01-17 ENCOUNTER — Ambulatory Visit (INDEPENDENT_AMBULATORY_CARE_PROVIDER_SITE_OTHER): Payer: Medicaid Other | Admitting: Cardiology

## 2020-01-17 ENCOUNTER — Other Ambulatory Visit: Payer: Self-pay

## 2020-01-17 VITALS — BP 150/72 | HR 81 | Ht 61.0 in | Wt 141.0 lb

## 2020-01-17 DIAGNOSIS — E7801 Familial hypercholesterolemia: Secondary | ICD-10-CM | POA: Diagnosis not present

## 2020-01-17 DIAGNOSIS — R42 Dizziness and giddiness: Secondary | ICD-10-CM

## 2020-01-17 DIAGNOSIS — Z720 Tobacco use: Secondary | ICD-10-CM

## 2020-01-17 DIAGNOSIS — E7849 Other hyperlipidemia: Secondary | ICD-10-CM

## 2020-01-17 DIAGNOSIS — H34212 Partial retinal artery occlusion, left eye: Secondary | ICD-10-CM

## 2020-01-17 DIAGNOSIS — E785 Hyperlipidemia, unspecified: Secondary | ICD-10-CM | POA: Diagnosis not present

## 2020-01-17 DIAGNOSIS — Z1329 Encounter for screening for other suspected endocrine disorder: Secondary | ICD-10-CM | POA: Diagnosis not present

## 2020-01-17 DIAGNOSIS — I10 Essential (primary) hypertension: Secondary | ICD-10-CM

## 2020-01-17 MED ORDER — EZETIMIBE 10 MG PO TABS: 10.0000 mg | ORAL_TABLET | Freq: Every day | ORAL | 3 refills | Status: DC

## 2020-01-17 NOTE — Progress Notes (Signed)
Cardiology Office Note:    Date:  01/17/2020   ID:  Diana [name], DOB Jul 02, 1956, MRN [id number]  PCP:  Cleophas Dunker, DO  Cardiologist:  Berniece Salines, DO  Electrophysiologist:  None   Referring MD: Lind Covert, *   Chief Complaint  Patient presents with   Pre-op Exam   History of Present Illness:    Diana [name] is a 64 y.o. female with a hx of  Hollenhorst plaque, hyperlipidemia, hypercholesterolemia, hypertension and tobacco abuse presents today to be evaluated preoperatively for pending macular hole OS. She was referred to cardiology preoperatively for dizziness and hyperlipidemia.  She adamantly denies any chest pain, shortness of breath, nausea or vomiting.  She states that she has been experiencing dizziness.  Past Medical History:  Diagnosis Date   Anxiety    Asthma    Barrett esophagus    "don't know that I've ever been stretched" (08/17/2013)   Choledocholithiasis with acute cholecystitis    Chronic lower back pain    COPD (chronic obstructive pulmonary disease) ([place])    Craniosynostosis    congenital   DDD (degenerative disc disease)    Family history of anesthesia complication    "PONV; both parents" (08/17/2013)   GERD (gastroesophageal reflux disease)    H/O hiatal hernia    History of blood transfusion 1959-1960   Hollenhorst plaque    Left eye   Hypercholesterolemia 05/28/2012   Has taken Crestor and Lipitor in the past. Switched to lovastatin b/c its $4.     Hypertension    IBS (irritable bowel syndrome)    Migraines    "since age 53; probably twice/month" (08/17/2013)   PONV (postoperative nausea and vomiting)    Rheumatoid arthritis ([place])    "in my knuckles" (08/17/2013)   Ruptured lumbar disc 1990's   L4-L5   Shingles     Past Surgical History:  Procedure Laterality Date   ABDOMINAL HYSTERECTOMY  1992   Total for chronic pelvic pain    APPENDECTOMY     BACK SURGERY  1990's   L4-L5 fusion @  [place]  05/28/2019   fused C4-5-6, done in [place]  2007   Performed in [place]   "1st time got infected; 2nd time didn't take; fix the 3rd time" (08/17/2013)   POSTERIOR LUMBAR FUSION  1990's   "took piece off my hip" (08/17/2013)   TOTAL HIP ARTHROPLASTY Right 12/31/04   AVN after fall injury; performed by Ralene Cork; DePuy S-ROM total hip (metal on metal)   UPPER GASTROINTESTINAL ENDOSCOPY      Current Medications: Current Meds  Medication Sig   albuterol (PROVENTIL HFA;VENTOLIN HFA) 108 (90 Base) MCG/ACT inhaler Inhale 2 puffs into the lungs every 6 (six) hours as needed for wheezing.   allopurinol (ZYLOPRIM) 100 MG tablet Take 1 tablet (100 mg total) by mouth daily.   amLODipine (NORVASC) 5 MG tablet Take 1 tablet by mouth once daily   Colchicine 0.6 MG CAPS Take 1 capsule by mouth daily.   cycloSPORINE (RESTASIS) 0.05 % ophthalmic emulsion Place 1 drop into both eyes 2 (two) times daily.   diclofenac (VOLTAREN) 75 MG EC tablet TAKE 1 TABLET BY MOUTH TWICE DAILY AFTER MEALS   diclofenac sodium (VOLTAREN) 1 % GEL Apply 2 g topically 4 (four) times daily as needed.   dicyclomine (BENTYL) 10 MG capsule Take 1  capsule (10 mg total) by mouth 2 (two) times daily as needed for spasms.   esomeprazole (NEXIUM) 40 MG capsule Take 1 capsule (40 mg total) by mouth daily at 12 noon.   ibuprofen (ADVIL) 800 MG tablet TAKE 1 TABLET BY MOUTH EVERY 8 HOURS WITH FOOD   linaclotide (LINZESS) 145 MCG CAPS capsule Take 1 capsule (145 mcg total) by mouth daily before breakfast.   methocarbamol (ROBAXIN) 500 MG tablet TAKE 1 TO 2 TABLETS BY MOUTH EVERY 6 HOURS AS NEEDED FOR SPASMS   Pitavastatin Calcium (LIVALO) 4 MG TABS Take 1 tablet (4 mg total) by mouth at bedtime.   propranolol (INDERAL) 40 MG tablet TAKE 1 TABLET BY MOUTH THREE TIMES DAILY    Tiotropium Bromide-Olodaterol (STIOLTO RESPIMAT) 2.5-2.5 MCG/ACT AERS Inhale 2 puffs into the lungs daily.     Allergies:   Penicillins, Demerol [meperidine], Doxycycline hyclate, Gabapentin, Sulfa antibiotics, Levaquin [levofloxacin], Benadryl [diphenhydramine hcl], Buprenorphine hcl, Cephalexin, Morphine and related, and Spiriva handihaler [tiotropium bromide monohydrate]   Social History   Socioeconomic History   Marital status: Divorced    Spouse name: Not on file   Number of children: 2   Years of education: Not on file   Highest education level: Not on file  Occupational History   Occupation: retired  Tobacco Use   Smoking status: Current Every Day Smoker    Packs/day: 0.50    Years: 32.00    Pack years: 16.00    Types: Cigarettes   Smokeless tobacco: Never Used   Tobacco comment: on chanix  Substance and Sexual Activity   Alcohol use: Yes    Alcohol/week: 0.0 standard drinks    Comment: occasionally   Drug use: No   Sexual activity: Not Currently  Other Topics Concern   Not on file  Social History Narrative   Not on file   Social Determinants of Health   Financial Resource Strain:    Difficulty of Paying Living Expenses: Not on file  Food Insecurity:    Worried About [place] in the Last Year: Not on file   Ran Out of Food in the Last Year: Not on file  Transportation Needs:    Lack of Transportation (Medical): Not on file   Lack of Transportation (Non-Medical): Not on file  Physical Activity:    Days of Exercise per Week: Not on file   Minutes of Exercise per Session: Not on file  Stress:    Feeling of Stress : Not on file  Social Connections:    Frequency of Communication with Friends and Family: Not on file   Frequency of Social Gatherings with Friends and Family: Not on file   Attends Religious Services: Not on file   Active Member of Clubs or Organizations: Not on file   Attends Archivist Meetings: Not  on file   Marital Status: Not on file     Family History: The patient's family history includes Colon cancer in her paternal grandfather; Hypertension in her father and mother; Skin cancer in her father. There is no history of Rectal cancer, Stomach cancer, or Esophageal cancer.  ROS:   Review of Systems  Constitution: Negative for decreased appetite, fever and weight gain.  HENT: Negative for congestion, ear discharge, hoarse voice and sore throat.   Eyes: Negative for discharge, redness, vision loss in right eye and visual halos.  Cardiovascular: Reports dizziness.  Negative for chest pain, dyspnea on exertion, leg swelling, orthopnea and palpitations.  Respiratory: Negative for  cough, hemoptysis, shortness of breath and snoring.   Endocrine: Negative for heat intolerance and polyphagia.  Hematologic/Lymphatic: Negative for bleeding problem. Does not bruise/bleed easily.  Skin: Negative for flushing, nail changes, rash and suspicious lesions.  Musculoskeletal: Negative for arthritis, joint pain, muscle cramps, myalgias, neck pain and stiffness.  Gastrointestinal: Negative for abdominal pain, bowel incontinence, diarrhea and excessive appetite.  Genitourinary: Negative for decreased libido, genital sores and incomplete emptying.  Neurological: Negative for brief paralysis, focal weakness, headaches and loss of balance.  Psychiatric/Behavioral: Negative for altered mental status, depression and suicidal ideas.  Allergic/Immunologic: Negative for HIV exposure and persistent infections.    EKGs/Labs/Other Studies Reviewed:    The following studies were reviewed today:   EKG:  The ekg ordered today demonstrates sinus rhythm, heart rate 74 bpm with incomplete right bundle branch block.  Recent Labs: 02/07/2019: TSH 1.650 11/15/2019: Platelets 227 01/13/2020: ALT 15; BUN 6; Creatinine, Ser 0.71; Hemoglobin 12.8; Potassium 4.4; Sodium 138  Recent Lipid Panel    Component Value Date/Time    CHOL 252 (H) 01/13/2020 1012   TRIG 150 (H) 01/13/2020 1012   HDL 60 01/13/2020 1012   CHOLHDL 4.2 01/13/2020 1012   CHOLHDL 5.7 04/20/2015 1138   VLDL 34 04/20/2015 1138   LDLCALC 165 (H) 01/13/2020 1012   LDLDIRECT 246 (H) 10/31/2015 1444    Physical Exam:    VS:  BP (!) 150/72 (BP Location: Right Arm, Patient Position: Sitting, Cuff Size: Normal)    Pulse 81    Ht 5\' 1"  (1.549 m)    Wt 141 lb (64 kg)    SpO2 96%    BMI 26.64 kg/m     Wt Readings from Last 3 Encounters:  01/17/20 141 lb (64 kg)  01/13/20 140 lb 6.4 oz (63.7 kg)  11/08/19 136 lb (61.7 kg)     GEN: Well nourished, well developed in no acute distress HEENT: Normal NECK: No JVD; No carotid bruits LYMPHATICS: No lymphadenopathy CARDIAC: S1S2 noted,RRR, no murmurs, rubs, gallops RESPIRATORY:  Clear to auscultation without rales, wheezing or rhonchi  ABDOMEN: Soft, non-tender, non-distended, +bowel sounds, no guarding. EXTREMITIES: No edema, No cyanosis, no clubbing MUSCULOSKELETAL:  No deformity  SKIN: Warm and dry NEUROLOGIC:  Alert and oriented x 3, non-focal PSYCHIATRIC:  Normal affect, good insight  ASSESSMENT:    1. Familial hypercholesterolemia   2. Familial hyperlipidemia   3. Thyroid disorder screen   4. Dizziness   5. Hollenhorst plaque, left eye   6. Tobacco abuse   7. Essential hypertension    PLAN:    Her blood pressure is elevated in the office today had high to be less than 130/80 but she tells me that she had not taking any other antihypertensive medication.  Her antihypertensive regimen includes amlodipine 5 mg daily and propanolol 40 mg.  I was able to review her lipid panel with her PCP which show be of 165 and total cholesterol 252.  She is on livalo which was recently increased to 4 mg daily.  Do suspect patient does have familial hyperlipidemia.  She tells me that she has tried Lipitor, Crestor, simvastatin and pravastatin in the past with cough has resolved since he gets  significant weakness of muscle with aches or pains.  I will add Zetia 10 mg daily to her regimen.  I am also going to get LP(a) as I do think that this patient may benefit from PCSK9 inhibitors therapy..  However repeat lipid profile in 6 weeks with her PCP.  Ultrasound of bilateral carotids as well as echocardiogram has been ordered by her PCP will follow up with results.  In terms of her dizziness ZIO monitor will be placed on patient for 7 days.  She adamantly denies any symptoms, and is able to achieve METS >4 therefore no further ischemic work up at this time. But if clinical picture changes we will reassess the need for ischemic evaluation.  Once the ultrasound and her echocardiogram has been reviewed further recommendation will be made for surgery.      She continues to smoke. The patient was counseled on tobacco cessation today for 5 minutes.  Counseling included reviewing the risks of smoking tobacco products, how it impacts the patient's current medical diagnoses and different strategies for quitting.  Pharmacotherapy to aid in tobacco cessation was not prescribed today. The patient coordinate with  primary care provider.  The patient was also advised to call  1-800-QUIT-NOW 720-172-9344) for additional help with quitting smoking.  Dizziness - zio monitor has been placed on the patient for 7 days to assess for any cardiac arrhythmias.  Blood work for lpa and TSH will be performed today.  The patient is in agreement with the above plan. The patient left the office in stable condition.  The patient will follow up in 2 months.   Medication Adjustments/Labs and Tests Ordered: Current medicines are reviewed at length with the patient today.  Concerns regarding medicines are outlined above.  Orders Placed This Encounter  Procedures   Lipoprotein A (LPA)   TSH   LONG TERM MONITOR (3-14 DAYS)   EKG 12-Lead   Meds ordered this encounter  Medications   ezetimibe (ZETIA) 10 MG  tablet    Sig: Take 1 tablet (10 mg total) by mouth daily.    Dispense:  90 tablet    Refill:  3    Patient Instructions  Medication Instructions:  Your physician has recommended you make the following change in your medication:   START: Zetia(EZETIMIBE) 10 mg Take 1 tab daily  *If you need a refill on your cardiac medications before your next appointment, please call your pharmacy*  Lab Work: Your physician recommends that you return for lab work in: [place]  If you have labs (blood work) drawn today and your tests are completely normal, you will receive your results only by:  [place] (if you have MyChart) OR  A paper copy in the mail If you have any lab test that is abnormal or we need to change your treatment, we will call you to review the results.  Testing/Procedures: None  Follow-Up: At [facility], you and your health needs are our priority.  As part of our continuing mission to provide you with exceptional heart care, we have created designated Provider Care Teams.  These Care Teams include your primary Cardiologist (physician) and Advanced Practice Providers (APPs -  Physician Assistants and Nurse Practitioners) who all work together to provide you with the care you need, when you need it.  Your next appointment:   2 month(s)  The format for your next appointment:   In Person  Provider:   Berniece Salines, DO  Other Instructions Ezetimibe Tablets What is this medicine? EZETIMIBE (ez ET i mibe) blocks the absorption of cholesterol from the stomach. It can help lower blood cholesterol for patients who are at risk of getting heart disease or a stroke. It is only for patients whose cholesterol level is not controlled by diet. This medicine may be  used for other purposes; ask your health care provider or pharmacist if you have questions. COMMON BRAND NAME(S): Zetia What should I tell my health care provider before I take this medicine? They need  to know if you have any of these conditions:  liver disease  an unusual or allergic reaction to ezetimibe, medicines, foods, dyes, or preservatives  pregnant or trying to get pregnant  breast-feeding How should I use this medicine? Take this medicine by mouth with a glass of water. Follow the directions on the prescription label. This medicine can be taken with or without food. Take your doses at regular intervals. Do not take your medicine more often than directed. Talk to your pediatrician regarding the use of this medicine in children. Special care may be needed. Overdosage: If you think you have taken too much of this medicine contact a poison control center or emergency room at once. NOTE: This medicine is only for you. Do not share this medicine with others. What if I miss a dose? If you miss a dose, take it as soon as you can. If it is almost time for your next dose, take only that dose. Do not take double or extra doses. What may interact with this medicine? Do not take this medicine with any of the following medications:  fenofibrate  gemfibrozil This medicine may also interact with the following medications:  antacids  cyclosporine  herbal medicines like red yeast rice  other medicines to lower cholesterol or triglycerides This list may not describe all possible interactions. Give your health care provider a list of all the medicines, herbs, non-prescription drugs, or dietary supplements you use. Also tell them if you smoke, drink alcohol, or use illegal drugs. Some items may interact with your medicine. What should I watch for while using this medicine? Visit your doctor or health care professional for regular checks on your progress. You will need to have your cholesterol levels checked. If you are also taking some other cholesterol medicines, you will also need to have tests to make sure your liver is working properly. Tell your doctor or health care professional if you  get any unexplained muscle pain, tenderness, or weakness, especially if you also have a fever and tiredness. You need to follow a low-cholesterol, low-fat diet while you are taking this medicine. This will decrease your risk of getting heart and blood vessel disease. Exercising and avoiding alcohol and smoking can also help. Ask your doctor or dietician for advice. What side effects may I notice from receiving this medicine? Side effects that you should report to your doctor or health care professional as soon as possible:  allergic reactions like skin rash, itching or hives, swelling of the face, lips, or tongue  dark yellow or brown urine  unusually weak or tired  yellowing of the skin or eyes Side effects that usually do not require medical attention (report to your doctor or health care professional if they continue or are bothersome):  diarrhea  dizziness  headache  stomach upset or pain This list may not describe all possible side effects. Call your doctor for medical advice about side effects. You may report side effects to FDA at 1-800-FDA-1088. Where should I keep my medicine? Keep out of the reach of children. Store at room temperature between 15 and 30 degrees C (59 and 86 degrees F). Protect from moisture. Keep container tightly closed. Throw away any unused medicine after the expiration date. NOTE: This sheet is a summary. It may  not cover all possible information. If you have questions about this medicine, talk to your doctor, pharmacist, or health care provider.  2020 Elsevier/Gold Standard (2012-05-24 15:39:09)      Adopting a Healthy Lifestyle.  Know what a healthy weight is for you (roughly BMI <25) and aim to maintain this   Aim for 7+ servings of fruits and vegetables daily   65-80+ fluid ounces of water or unsweet tea for healthy kidneys   Limit to max 1 drink of alcohol per day; avoid smoking/tobacco   Limit animal fats in diet for cholesterol and heart  health - choose grass fed whenever available   Avoid highly processed foods, and foods high in saturated/trans fats   Aim for low stress - take time to unwind and care for your mental health   Aim for 150 min of moderate intensity exercise weekly for heart health, and weights twice weekly for bone health   Aim for 7-9 hours of sleep daily   When it comes to diets, agreement about the perfect plan isnt easy to find, even among the experts. Experts at the [employer] developed an idea known as the Healthy Eating Plate. Just imagine a plate divided into logical, healthy portions.   The emphasis is on diet quality:   Load up on vegetables and fruits - one-half of your plate: Aim for color and variety, and remember that potatoes dont count.   Go for whole grains - one-quarter of your plate: Whole wheat, barley, wheat berries, quinoa, oats, brown rice, and foods made with them. If you want pasta, go with whole wheat pasta.   Protein power - one-quarter of your plate: Fish, chicken, beans, and nuts are all healthy, versatile protein sources. Limit red meat.   The diet, however, does go beyond the plate, offering a few other suggestions.   Use healthy plant oils, such as olive, canola, soy, corn, sunflower and peanut. Check the labels, and avoid partially hydrogenated oil, which have unhealthy trans fats.   If youre thirsty, drink water. Coffee and tea are good in moderation, but skip sugary drinks and limit milk and dairy products to one or two daily servings.   The type of carbohydrate in the diet is more important than the amount. Some sources of carbohydrates, such as vegetables, fruits, whole grains, and beans-are healthier than others.   Finally, stay active  Signed, Berniece Salines, DO  01/17/2020 3:27 PM    [facility]

## 2020-01-17 NOTE — Patient Instructions (Signed)
Medication Instructions:  Your physician has recommended you make the following change in your medication:   START: Zetia(EZETIMIBE) 10 mg Take 1 tab daily  *If you need a refill on your cardiac medications before your next appointment, please call your pharmacy*  Lab Work: Your physician recommends that you return for lab work in: [place]  If you have labs (blood work) drawn today and your tests are completely normal, you will receive your results only by: Marland Kitchen MyChart Message (if you have MyChart) OR . A paper copy in the mail If you have any lab test that is abnormal or we need to change your treatment, we will call you to review the results.  Testing/Procedures: None  Follow-Up: At [facility], you and your health needs are our priority.  As part of our continuing mission to provide you with exceptional heart care, we have created designated Provider Care Teams.  These Care Teams include your primary Cardiologist (physician) and Advanced Practice Providers (APPs -  Physician Assistants and Nurse Practitioners) who all work together to provide you with the care you need, when you need it.  Your next appointment:   2 month(s)  The format for your next appointment:   In Person  Provider:   Berniece Salines, DO  Other Instructions Ezetimibe Tablets What is this medicine? EZETIMIBE (ez ET i mibe) blocks the absorption of cholesterol from the stomach. It can help lower blood cholesterol for patients who are at risk of getting heart disease or a stroke. It is only for patients whose cholesterol level is not controlled by diet. This medicine may be used for other purposes; ask your health care provider or pharmacist if you have questions. COMMON BRAND NAME(S): Zetia What should I tell my health care provider before I take this medicine? They need to know if you have any of these conditions:  liver disease  an unusual or allergic reaction to ezetimibe, medicines, foods,  dyes, or preservatives  pregnant or trying to get pregnant  breast-feeding How should I use this medicine? Take this medicine by mouth with a glass of water. Follow the directions on the prescription label. This medicine can be taken with or without food. Take your doses at regular intervals. Do not take your medicine more often than directed. Talk to your pediatrician regarding the use of this medicine in children. Special care may be needed. Overdosage: If you think you have taken too much of this medicine contact a poison control center or emergency room at once. NOTE: This medicine is only for you. Do not share this medicine with others. What if I miss a dose? If you miss a dose, take it as soon as you can. If it is almost time for your next dose, take only that dose. Do not take double or extra doses. What may interact with this medicine? Do not take this medicine with any of the following medications:  fenofibrate  gemfibrozil This medicine may also interact with the following medications:  antacids  cyclosporine  herbal medicines like red yeast rice  other medicines to lower cholesterol or triglycerides This list may not describe all possible interactions. Give your health care provider a list of all the medicines, herbs, non-prescription drugs, or dietary supplements you use. Also tell them if you smoke, drink alcohol, or use illegal drugs. Some items may interact with your medicine. What should I watch for while using this medicine? Visit your doctor or health care professional for regular checks on  your progress. You will need to have your cholesterol levels checked. If you are also taking some other cholesterol medicines, you will also need to have tests to make sure your liver is working properly. Tell your doctor or health care professional if you get any unexplained muscle pain, tenderness, or weakness, especially if you also have a fever and tiredness. You need to follow  a low-cholesterol, low-fat diet while you are taking this medicine. This will decrease your risk of getting heart and blood vessel disease. Exercising and avoiding alcohol and smoking can also help. Ask your doctor or dietician for advice. What side effects may I notice from receiving this medicine? Side effects that you should report to your doctor or health care professional as soon as possible:  allergic reactions like skin rash, itching or hives, swelling of the face, lips, or tongue  dark yellow or brown urine  unusually weak or tired  yellowing of the skin or eyes Side effects that usually do not require medical attention (report to your doctor or health care professional if they continue or are bothersome):  diarrhea  dizziness  headache  stomach upset or pain This list may not describe all possible side effects. Call your doctor for medical advice about side effects. You may report side effects to FDA at 1-800-FDA-1088. Where should I keep my medicine? Keep out of the reach of children. Store at room temperature between 15 and 30 degrees C (59 and 86 degrees F). Protect from moisture. Keep container tightly closed. Throw away any unused medicine after the expiration date. NOTE: This sheet is a summary. It may not cover all possible information. If you have questions about this medicine, talk to your doctor, pharmacist, or health care provider.  2020 Elsevier/Gold Standard (2012-05-24 15:39:09)

## 2020-01-18 ENCOUNTER — Other Ambulatory Visit (HOSPITAL_COMMUNITY): Payer: Medicaid Other

## 2020-01-18 ENCOUNTER — Ambulatory Visit (HOSPITAL_COMMUNITY)
Admission: RE | Admit: 2020-01-18 | Discharge: 2020-01-18 | Disposition: A | Discharge status: Home / Self Care | Payer: Medicaid Other | Source: Ambulatory Visit | Attending: Family Medicine | Admitting: Family Medicine

## 2020-01-18 ENCOUNTER — Ambulatory Visit (HOSPITAL_BASED_OUTPATIENT_CLINIC_OR_DEPARTMENT_OTHER)
Admission: RE | Admit: 2020-01-18 | Discharge: 2020-01-18 | Disposition: A | Discharge status: Home / Self Care | Payer: Medicaid Other | Source: Ambulatory Visit | Attending: Family Medicine | Admitting: Family Medicine

## 2020-01-18 DIAGNOSIS — H34212 Partial retinal artery occlusion, left eye: Secondary | ICD-10-CM

## 2020-01-18 DIAGNOSIS — M25552 Pain in left hip: Secondary | ICD-10-CM | POA: Diagnosis not present

## 2020-01-18 DIAGNOSIS — M25652 Stiffness of left hip, not elsewhere classified: Secondary | ICD-10-CM | POA: Diagnosis not present

## 2020-01-18 DIAGNOSIS — R2689 Other abnormalities of gait and mobility: Secondary | ICD-10-CM | POA: Diagnosis not present

## 2020-01-18 LAB — LIPOPROTEIN A (LPA): Lipoprotein (a): 141.5 nmol/L — ABNORMAL HIGH (ref <75.0)

## 2020-01-18 LAB — TSH: TSH: 2.81 u[IU]/mL (ref 0.450–4.500)

## 2020-01-18 NOTE — Progress Notes (Signed)
  Echocardiogram 2D Echocardiogram has been performed.  Diana [name] 01/18/2020, 10:40 AM

## 2020-01-18 NOTE — Progress Notes (Signed)
Carotid duplex has been completed.   Preliminary results in CV Proc.   Abram Sander 01/18/2020 11:39 AM

## 2020-01-23 ENCOUNTER — Telehealth: Payer: Self-pay | Admitting: Cardiology

## 2020-01-23 ENCOUNTER — Encounter: Payer: Self-pay | Admitting: *Deleted

## 2020-01-23 NOTE — Telephone Encounter (Signed)
Left message to call back  

## 2020-01-23 NOTE — Telephone Encounter (Signed)
Patient is calling to check on the status of surgical clearance being sent over to the requesting provider due to not hearing anything in regards to it. Patient is requesting a callback in regards to this once clearance has been sent. Please advise.

## 2020-01-23 NOTE — Telephone Encounter (Signed)
Telephone call back to patient.Informed she is clear from a cardiac standpoint per Dr Harriet Masson. Fax sent through Epic to Dr Randell Loop. [phone].

## 2020-01-23 NOTE — Telephone Encounter (Signed)
Patient is returning call.  °

## 2020-01-31 ENCOUNTER — Telehealth: Payer: Self-pay | Admitting: Cardiology

## 2020-01-31 DIAGNOSIS — M47812 Spondylosis without myelopathy or radiculopathy, cervical region: Secondary | ICD-10-CM | POA: Diagnosis not present

## 2020-01-31 NOTE — Telephone Encounter (Signed)
Spoke with rep from [facility].  She was calling to report that pt had an 2.5 second episode of 2nd degree AV block with a rate of 29BPM on 2/23 at 9:49AM.  Will route to Dr. Harriet Masson to make her aware.  Rep states the monitor results are loaded to the site.

## 2020-01-31 NOTE — Telephone Encounter (Signed)
   Claiborne Billings from [facility] wanted to speak with Dr. Terrial Rhodes nurse to do pt report.  Please call

## 2020-01-31 NOTE — Telephone Encounter (Signed)
Kelly from [facility] calling back with a critcal report.

## 2020-02-07 DIAGNOSIS — M25552 Pain in left hip: Secondary | ICD-10-CM | POA: Diagnosis not present

## 2020-02-07 DIAGNOSIS — R2689 Other abnormalities of gait and mobility: Secondary | ICD-10-CM | POA: Diagnosis not present

## 2020-02-07 DIAGNOSIS — M25652 Stiffness of left hip, not elsewhere classified: Secondary | ICD-10-CM | POA: Diagnosis not present

## 2020-02-10 ENCOUNTER — Encounter: Payer: Self-pay | Admitting: Cardiology

## 2020-02-10 ENCOUNTER — Ambulatory Visit (INDEPENDENT_AMBULATORY_CARE_PROVIDER_SITE_OTHER): Payer: Medicaid Other | Admitting: Cardiology

## 2020-02-10 ENCOUNTER — Other Ambulatory Visit: Payer: Self-pay

## 2020-02-10 VITALS — BP 120/70 | HR 69 | Ht 61.0 in | Wt 141.0 lb

## 2020-02-10 DIAGNOSIS — E7849 Other hyperlipidemia: Secondary | ICD-10-CM

## 2020-02-10 DIAGNOSIS — I441 Atrioventricular block, second degree: Secondary | ICD-10-CM

## 2020-02-10 DIAGNOSIS — H34219 Partial retinal artery occlusion, unspecified eye: Secondary | ICD-10-CM

## 2020-02-10 DIAGNOSIS — I455 Other specified heart block: Secondary | ICD-10-CM | POA: Diagnosis not present

## 2020-02-10 DIAGNOSIS — I6523 Occlusion and stenosis of bilateral carotid arteries: Secondary | ICD-10-CM | POA: Diagnosis not present

## 2020-02-10 DIAGNOSIS — I10 Essential (primary) hypertension: Secondary | ICD-10-CM | POA: Diagnosis not present

## 2020-02-10 DIAGNOSIS — E7801 Familial hypercholesterolemia: Secondary | ICD-10-CM

## 2020-02-10 HISTORY — DX: Other specified heart block: I45.5

## 2020-02-10 HISTORY — DX: Atrioventricular block, second degree: I44.1

## 2020-02-10 MED ORDER — LOSARTAN POTASSIUM 25 MG PO TABS: 25.0000 mg | ORAL_TABLET | Freq: Every day | ORAL | 1 refills | Status: DC

## 2020-02-10 NOTE — Patient Instructions (Signed)
Medication Instruction: Your physician has recommended you make the following change in your medication:   STOP: Propranolol  START: Losartan 25 mg Take 1 tab daily  *If you need a refill on your cardiac medications before your next appointment, please call your pharmacy*   Lab Work: Your physician recommends that you return for lab work in: TODAY BMP,Magnesium  If you have labs (blood work) drawn today and your tests are completely normal, you will receive your results only by: Marland Kitchen MyChart Message (if you have MyChart) OR . A paper copy in the mail If you have any lab test that is abnormal or we need to change your treatment, we will call you to review the results.   Testing/Procedures: None   Follow-Up: At [facility], you and your health needs are our priority.  As part of our continuing mission to provide you with exceptional heart care, we have created designated Provider Care Teams.  These Care Teams include your primary Cardiologist (physician) and Advanced Practice Providers (APPs -  Physician Assistants and Nurse Practitioners) who all work together to provide you with the care you need, when you need it.  We recommend signing up for the patient portal called "MyChart".  Sign up information is provided on this After Visit Summary.  MyChart is used to connect with patients for Virtual Visits (Telemedicine).  Patients are able to view lab/test results, encounter notes, upcoming appointments, etc.  Non-urgent messages can be sent to your provider as well.   To learn more about what you can do with MyChart, go to [email or web address].    Your next appointment:   1 month(s)  The format for your next appointment:   In Person  Provider:   Berniece Salines, DO   Other Instructions You are being referred to Dr Cristopher Peru for your AV block. They will contact you with an appointment date and time.

## 2020-02-10 NOTE — Progress Notes (Signed)
Cardiology Office Note:    Date:  02/10/2020   ID:  Diana [name], DOB August 01, 1956, MRN [id number]  PCP:  Cleophas Dunker, DO  Cardiologist:  Berniece Salines, DO  Electrophysiologist:  None   Referring MD: Cleophas Dunker, *   Chief Complaint  Patient presents with  . Follow-up    monitor and labs    History of Present Illness:    Diana [name] is a 64 y.o. female with a hx of  Hollenhorst plaque, hyperlipidemia, hypercholesterolemia, hypertension and tobacco abuse presents today to be evaluated preoperatively for pending macular hole OS.  I initially saw the patient after she was referred to cardiology for dizziness and hyperlipidemia.  At the conclusion of her visit I recommended she wear a ZIO monitor as well as repeat lipid profile was done.  An echocardiogram and bilateral Dopplers had been ordered by her PCP.  In the interim she was able to get her echocardiogram done which was reported as normal.  The patient is here today to discuss her ZIO monitor results as well as her lab results.  Past Medical History:  Diagnosis Date  . Anxiety   . Asthma   . Barrett esophagus    "don't know that I've ever been stretched" (08/17/2013)  . Choledocholithiasis with acute cholecystitis   . Chronic lower back pain   . COPD (chronic obstructive pulmonary disease) ([place])   . Craniosynostosis    congenital  . DDD (degenerative disc disease)   . Family history of anesthesia complication    "PONV; both parents" (08/17/2013)  . GERD (gastroesophageal reflux disease)   . H/O hiatal hernia   . History of blood transfusion 1959-1960  . Hollenhorst plaque    Left eye  . Hypercholesterolemia 05/28/2012   Has taken Crestor and Lipitor in the past. Switched to lovastatin b/c its $4.    . Hypertension   . IBS (irritable bowel syndrome)   . Migraines    "since age 64; probably twice/month" (08/17/2013)  . PONV (postoperative nausea and vomiting)   . Rheumatoid arthritis ([place])    "in my  knuckles" (08/17/2013)  . Ruptured lumbar disc 1990's   L4-L5  . Shingles     Past Surgical History:  Procedure Laterality Date  . ABDOMINAL HYSTERECTOMY  1992   Total for chronic pelvic pain   . APPENDECTOMY    . BACK SURGERY  1990's   L4-L5 fusion @ [facility]  05/28/2019   fused C4-5-6, done in [place]  . CHOLECYSTECTOMY  2007   Performed in [place]   . COLONOSCOPY    . CRANIECTOMY FOR CRANIOSYNOSTOSIS  [id number]   "1st time got infected; 2nd time didn't take; fix the 3rd time" (08/17/2013)  . POSTERIOR LUMBAR FUSION  1990's   "took piece off my hip" (08/17/2013)  . TOTAL HIP ARTHROPLASTY Right 12/31/04   AVN after fall injury; performed by Ralene Cork; DePuy S-ROM total hip (metal on metal)  . UPPER GASTROINTESTINAL ENDOSCOPY      Current Medications: Current Meds  Medication Sig  . albuterol (PROVENTIL HFA;VENTOLIN HFA) 108 (90 Base) MCG/ACT inhaler Inhale 2 puffs into the lungs every 6 (six) hours as needed for wheezing.  Marland Kitchen allopurinol (ZYLOPRIM) 100 MG tablet Take 1 tablet (100 mg total) by mouth daily.  Marland Kitchen amLODipine (NORVASC) 5 MG tablet Take 1 tablet by mouth once daily  . calcium-vitamin D (OSCAL WITH D) 500-200 MG-UNIT TABS tablet Take by mouth.  Marland Kitchen  Colchicine 0.6 MG CAPS Take 1 capsule by mouth daily.  . cycloSPORINE (RESTASIS) 0.05 % ophthalmic emulsion Place 1 drop into both eyes 2 (two) times daily.  . diclofenac (VOLTAREN) 75 MG EC tablet TAKE 1 TABLET BY MOUTH TWICE DAILY AFTER MEALS  . diclofenac sodium (VOLTAREN) 1 % GEL Apply 2 g topically 4 (four) times daily as needed.  . dicyclomine (BENTYL) 10 MG capsule Take 1 capsule (10 mg total) by mouth 2 (two) times daily as needed for spasms.  Marland Kitchen esomeprazole (NEXIUM) 40 MG capsule Take 1 capsule (40 mg total) by mouth daily at 12 noon.  . ezetimibe (ZETIA) 10 MG tablet Take 1 tablet (10 mg total) by mouth daily.  Marland Kitchen ibuprofen (ADVIL) 800 MG tablet TAKE 1 TABLET BY MOUTH  EVERY 8 HOURS WITH FOOD  . linaclotide (LINZESS) 145 MCG CAPS capsule Take 1 capsule (145 mcg total) by mouth daily before breakfast.  . methocarbamol (ROBAXIN) 500 MG tablet TAKE 1 TO 2 TABLETS BY MOUTH EVERY 6 HOURS AS NEEDED FOR SPASMS  . Pitavastatin Calcium (LIVALO) 4 MG TABS Take 1 tablet (4 mg total) by mouth at bedtime.  . Tiotropium Bromide-Olodaterol (STIOLTO RESPIMAT) 2.5-2.5 MCG/ACT AERS Inhale 2 puffs into the lungs daily.  . [DISCONTINUED] propranolol (INDERAL) 40 MG tablet TAKE 1 TABLET BY MOUTH THREE TIMES DAILY     Allergies:   Penicillins, Demerol [meperidine], Doxycycline hyclate, Gabapentin, Sulfa antibiotics, Levaquin [levofloxacin], Benadryl [diphenhydramine hcl], Buprenorphine hcl, Cephalexin, Morphine and related, and Spiriva handihaler [tiotropium bromide monohydrate]   Social History   Socioeconomic History  . Marital status: Divorced    Spouse name: Not on file  . Number of children: 2  . Years of education: Not on file  . Highest education level: Not on file  Occupational History  . Occupation: retired  Tobacco Use  . Smoking status: Current Every Day Smoker    Packs/day: 0.50    Years: 32.00    Pack years: 16.00    Types: Cigarettes  . Smokeless tobacco: Never Used  . Tobacco comment: on chanix  Substance and Sexual Activity  . Alcohol use: Yes    Alcohol/week: 0.0 standard drinks    Comment: occasionally  . Drug use: No  . Sexual activity: Not Currently  Other Topics Concern  . Not on file  Social History Narrative  . Not on file   Social Determinants of Health   Financial Resource Strain:   . Difficulty of Paying Living Expenses:   Food Insecurity:   . Worried About Charity fundraiser in the Last Year:   . Arboriculturist in the Last Year:   Transportation Needs:   . Film/video editor (Medical):   Marland Kitchen Lack of Transportation (Non-Medical):   Physical Activity:   . Days of Exercise per Week:   . Minutes of Exercise per Session:     Stress:   . Feeling of Stress :   Social Connections:   . Frequency of Communication with Friends and Family:   . Frequency of Social Gatherings with Friends and Family:   . Attends Religious Services:   . Active Member of Clubs or Organizations:   . Attends Archivist Meetings:   Marland Kitchen Marital Status:      Family History: The patient's family history includes Colon cancer in her paternal grandfather; Hypertension in her father and mother; Skin cancer in her father. There is no history of Rectal cancer, Stomach cancer, or Esophageal cancer.  ROS:  Review of Systems  Constitution: Negative for decreased appetite, fever and weight gain.  HENT: Negative for congestion, ear discharge, hoarse voice and sore throat.   Eyes: Negative for discharge, redness, vision loss in right eye and visual halos.  Cardiovascular: Negative for chest pain, dyspnea on exertion, leg swelling, orthopnea and palpitations.  Respiratory: Negative for cough, hemoptysis, shortness of breath and snoring.   Endocrine: Negative for heat intolerance and polyphagia.  Hematologic/Lymphatic: Negative for bleeding problem. Does not bruise/bleed easily.  Skin: Negative for flushing, nail changes, rash and suspicious lesions.  Musculoskeletal: Negative for arthritis, joint pain, muscle cramps, myalgias, neck pain and stiffness.  Gastrointestinal: Negative for abdominal pain, bowel incontinence, diarrhea and excessive appetite.  Genitourinary: Negative for decreased libido, genital sores and incomplete emptying.  Neurological: Negative for brief paralysis, focal weakness, headaches and loss of balance.  Psychiatric/Behavioral: Negative for altered mental status, depression and suicidal ideas.  Allergic/Immunologic: Negative for HIV exposure and persistent infections.    EKGs/Labs/Other Studies Reviewed:    The following studies were reviewed today:   EKG:  The ekg ordered today demonstrates   Zio Monitor  The  patient wore the monitor for 6 days 20 hours starting 01/17/2020. Indication: Dizziness  The minimum heart rate was 29 bpm, maximum heart rate was 150 bpm, and average heart rate was 77 bpm.  Predominant underlying rhythm was Sinus Rhythm.  First Degree AV Block was present. Bundle Branch Block/IVCD was present.   6 Supraventricular Tachycardia runs occurred, the run with the fastest interval lasting 5 beats with a maximum rate of 150 bpm, the longest lasting 10 beats with an average rate of 94 bpm.    1 episode of AV Block (2nd) occurred, lasting a total of 3 seconds.   Premature atrial complexes were rare (less than 1%). Premature Ventricular complexes were rare (less than 1%).  No ventricular tachycardia, No third-degree AV block and no atrial fibrillation present.  4 patient triggered events noted or associated with premature atrial complexes.  Conclusion: This study is remarkable for the following.                             1.  Second-degree (2-1) AV block, lasting 3 seconds.                             2.  Asymptomatic paroxysmal supraventricular tachycardia.                             3.  Rare symptomatic premature atrial complexes.    TTE IMPRESSIONS  . Left ventricular ejection fraction, by estimation, is 55 to 60%. The  left ventricle has normal function. The left ventricle has no regional  wall motion abnormalities. Left ventricular diastolic parameters are  consistent with Grade I diastolic  dysfunction (impaired relaxation).  2. Right ventricular systolic function is normal. The right ventricular  size is normal.  3. The mitral valve is normal in structure and function. No evidence of  mitral valve regurgitation. No evidence of mitral stenosis.  4. The aortic valve is normal in structure and function. Aortic valve  regurgitation is not visualized. No aortic stenosis is present.  5. There is no obvious atheromatous plaque identified in the visible    portions of the ascending aorta or aortic arch.  6. The inferior vena cava is normal in  size with greater than 50%  respiratory variability, suggesting right atrial pressure of 3 mmHg.   Bilateral US carotid Summary:  Right Carotid: Velocities in the right ICA are consistent with a 60-79%         stenosis. The ECA appears >50% stenosed. Low end 60%  stenosis.   Left Carotid: Velocities in the left ICA are consistent with a 60-79%  stenosis.        The ECA appears >50% stenosed.   Vertebrals: Bilateral vertebral arteries demonstrate antegrade flow.  Subclavians: Normal flow hemodynamics were seen in bilateral subclavian        arteries.     Recent Labs: 11/15/2019: Platelets 227 01/13/2020: ALT 15; BUN 6; Creatinine, Ser 0.71; Hemoglobin 12.8; Potassium 4.4; Sodium 138 01/17/2020: TSH 2.810  Recent Lipid Panel    Component Value Date/Time   CHOL 252 (H) 01/13/2020 1012   TRIG 150 (H) 01/13/2020 1012   HDL 60 01/13/2020 1012   CHOLHDL 4.2 01/13/2020 1012   CHOLHDL 5.7 04/20/2015 1138   VLDL 34 04/20/2015 1138   LDLCALC 165 (H) 01/13/2020 1012   LDLDIRECT 246 (H) 10/31/2015 1444    Physical Exam:    VS:  BP 120/70 (BP Location: Right Arm, Patient Position: Sitting, Cuff Size: Normal)   Pulse 69   Ht 5\' 1"  (1.549 m)   Wt 141 lb (64 kg)   SpO2 97%   BMI 26.64 kg/m     Wt Readings from Last 3 Encounters:  02/10/20 141 lb (64 kg)  01/17/20 141 lb (64 kg)  01/13/20 140 lb 6.4 oz (63.7 kg)     GEN: Well nourished, well developed in no acute distress HEENT: Normal NECK: No JVD; No carotid bruits LYMPHATICS: No lymphadenopathy CARDIAC: S1S2 noted,RRR, no murmurs, rubs, gallops RESPIRATORY:  Clear to auscultation without rales, wheezing or rhonchi  ABDOMEN: Soft, non-tender, non-distended, +bowel sounds, no guarding. EXTREMITIES: No edema, No cyanosis, no clubbing MUSCULOSKELETAL:  No deformity  SKIN: Warm and dry NEUROLOGIC:  Alert and  oriented x 3, non-focal PSYCHIATRIC:  Normal affect, good insight  ASSESSMENT:    1. Second degree AV block   2. Sinus pause   3. Familial hyperlipidemia   4. Familial hypercholesterolemia   5. Bilateral carotid artery stenosis   6. Hollenhorst plaque   7. Essential hypertension    PLAN:     Her monitor did show evidence of 2:1 AV block with a 3 second pause. I have discussed this finding with the patient. She is on propanolol and she tells me this medication was started many years ago for blood pressure control. I am going to stop this medication. In addition I will refer her for an EP evaluation. I was able to speak to her ophthamologist about this finding.   Hyperlipidemia - her lipid profile shows elevated LDL 165, TG 150, TC 252. Continue her Pitavastatin dose at 4 mg for now. He Lpa was elevated - she will certainly benefit from PCSK9 inhibitor.  Hollenhoert plaque- planning ophthalmological surgery for her macula hole.  HTN - due to her sinus pause seen on the monitor, I will stop her propranolol and start the patient on losartan 25 mg daily in addition to her Amlodipine.   The patient is in agreement with the above plan. The patient left the office in stable condition.  The patient will follow up in 1 months.   Medication Adjustments/Labs and Tests Ordered: Current medicines are reviewed at length with the patient today.  Concerns regarding  medicines are outlined above.  Orders Placed This Encounter  Procedures  . Basic Metabolic Panel (BMET)  . Magnesium  . Ambulatory referral to Cardiac Electrophysiology   Meds ordered this encounter  Medications  . losartan (COZAAR) 25 MG tablet    Sig: Take 1 tablet (25 mg total) by mouth daily.    Dispense:  90 tablet    Refill:  1    Patient Instructions  Medication Instruction: Your physician has recommended you make the following change in your medication:   STOP: Propranolol  START: Losartan 25 mg Take 1 tab  daily  *If you need a refill on your cardiac medications before your next appointment, please call your pharmacy*   Lab Work: Your physician recommends that you return for lab work in: TODAY BMP,Magnesium  If you have labs (blood work) drawn today and your tests are completely normal, you will receive your results only by: Marland Kitchen MyChart Message (if you have MyChart) OR . A paper copy in the mail If you have any lab test that is abnormal or we need to change your treatment, we will call you to review the results.   Testing/Procedures: None   Follow-Up: At [facility], you and your health needs are our priority.  As part of our continuing mission to provide you with exceptional heart care, we have created designated Provider Care Teams.  These Care Teams include your primary Cardiologist (physician) and Advanced Practice Providers (APPs -  Physician Assistants and Nurse Practitioners) who all work together to provide you with the care you need, when you need it.  We recommend signing up for the patient portal called "MyChart".  Sign up information is provided on this After Visit Summary.  MyChart is used to connect with patients for Virtual Visits (Telemedicine).  Patients are able to view lab/test results, encounter notes, upcoming appointments, etc.  Non-urgent messages can be sent to your provider as well.   To learn more about what you can do with MyChart, go to [email or web address].    Your next appointment:   1 month(s)  The format for your next appointment:   In Person  Provider:   Berniece Salines, DO   Other Instructions You are being referred to Dr Cristopher Peru for your AV block. They will contact you with an appointment date and time.     Adopting a Healthy Lifestyle.  Know what a healthy weight is for you (roughly BMI <25) and aim to maintain this   Aim for 7+ servings of fruits and vegetables daily   65-80+ fluid ounces of water or unsweet tea for healthy  kidneys   Limit to max 1 drink of alcohol per day; avoid smoking/tobacco   Limit animal fats in diet for cholesterol and heart health - choose grass fed whenever available   Avoid highly processed foods, and foods high in saturated/trans fats   Aim for low stress - take time to unwind and care for your mental health   Aim for 150 min of moderate intensity exercise weekly for heart health, and weights twice weekly for bone health   Aim for 7-9 hours of sleep daily   When it comes to diets, agreement about the perfect plan isnt easy to find, even among the experts. Experts at the [employer] developed an idea known as the Healthy Eating Plate. Just imagine a plate divided into logical, healthy portions.   The emphasis is on diet quality:   Load up on vegetables  and fruits - one-half of your plate: Aim for color and variety, and remember that potatoes dont count.   Go for whole grains - one-quarter of your plate: Whole wheat, barley, wheat berries, quinoa, oats, brown rice, and foods made with them. If you want pasta, go with whole wheat pasta.   Protein power - one-quarter of your plate: Fish, chicken, beans, and nuts are all healthy, versatile protein sources. Limit red meat.   The diet, however, does go beyond the plate, offering a few other suggestions.   Use healthy plant oils, such as olive, canola, soy, corn, sunflower and peanut. Check the labels, and avoid partially hydrogenated oil, which have unhealthy trans fats.   If youre thirsty, drink water. Coffee and tea are good in moderation, but skip sugary drinks and limit milk and dairy products to one or two daily servings.   The type of carbohydrate in the diet is more important than the amount. Some sources of carbohydrates, such as vegetables, fruits, whole grains, and beans-are healthier than others.   Finally, stay active  Signed, Berniece Salines, DO  02/10/2020 10:12 PM    [facility]  [facility]

## 2020-02-11 DIAGNOSIS — Z01818 Encounter for other preprocedural examination: Secondary | ICD-10-CM | POA: Diagnosis not present

## 2020-02-11 LAB — BASIC METABOLIC PANEL
BUN/Creatinine Ratio: 7 — ABNORMAL LOW (ref 12–28)
BUN: 6 mg/dL — ABNORMAL LOW (ref 8–27)
CO2: 24 mmol/L (ref 20–29)
Calcium: 9.5 mg/dL (ref 8.7–10.3)
Chloride: 100 mmol/L (ref 96–106)
Creatinine, Ser: 0.81 mg/dL (ref 0.57–1.00)
GFR calc Af Amer: 91 mL/min/{1.73_m2} (ref >59)
GFR calc non Af Amer: 79 mL/min/{1.73_m2} (ref >59)
Glucose: 75 mg/dL (ref 65–99)
Potassium: 4 mmol/L (ref 3.5–5.2)
Sodium: 139 mmol/L (ref 134–144)

## 2020-02-11 LAB — MAGNESIUM: Magnesium: 2.1 mg/dL (ref 1.6–2.3)

## 2020-02-13 ENCOUNTER — Telehealth: Payer: Self-pay | Admitting: Cardiology

## 2020-02-13 NOTE — Telephone Encounter (Signed)
I spoke with her surgeon Dr. Randell Loop  about her recent visit - he plans to call her and discuss with her. I suggest she call his office.

## 2020-02-13 NOTE — Telephone Encounter (Addendum)
Pt given her lab results and advised this message from Dr. Harriet Masson and she will call Dr. Randell Loop this morning.

## 2020-02-13 NOTE — Telephone Encounter (Signed)
New Message   Patient is calling with follow up questions about her upcoming surgery. She wants to know if Dr. Harriet Masson has spoken with her surgeon and what they discuss. Please call.

## 2020-02-20 DIAGNOSIS — M5416 Radiculopathy, lumbar region: Secondary | ICD-10-CM | POA: Diagnosis not present

## 2020-02-20 DIAGNOSIS — M5414 Radiculopathy, thoracic region: Secondary | ICD-10-CM | POA: Diagnosis not present

## 2020-02-20 DIAGNOSIS — M9903 Segmental and somatic dysfunction of lumbar region: Secondary | ICD-10-CM | POA: Diagnosis not present

## 2020-02-20 DIAGNOSIS — M9902 Segmental and somatic dysfunction of thoracic region: Secondary | ICD-10-CM | POA: Diagnosis not present

## 2020-02-20 DIAGNOSIS — M5418 Radiculopathy, sacral and sacrococcygeal region: Secondary | ICD-10-CM | POA: Diagnosis not present

## 2020-02-20 DIAGNOSIS — M9904 Segmental and somatic dysfunction of sacral region: Secondary | ICD-10-CM | POA: Diagnosis not present

## 2020-02-22 DIAGNOSIS — M5418 Radiculopathy, sacral and sacrococcygeal region: Secondary | ICD-10-CM | POA: Diagnosis not present

## 2020-02-22 DIAGNOSIS — M9904 Segmental and somatic dysfunction of sacral region: Secondary | ICD-10-CM | POA: Diagnosis not present

## 2020-02-22 DIAGNOSIS — J209 Acute bronchitis, unspecified: Secondary | ICD-10-CM | POA: Diagnosis not present

## 2020-02-22 DIAGNOSIS — M9903 Segmental and somatic dysfunction of lumbar region: Secondary | ICD-10-CM | POA: Diagnosis not present

## 2020-02-22 DIAGNOSIS — M9902 Segmental and somatic dysfunction of thoracic region: Secondary | ICD-10-CM | POA: Diagnosis not present

## 2020-02-22 DIAGNOSIS — M5414 Radiculopathy, thoracic region: Secondary | ICD-10-CM | POA: Diagnosis not present

## 2020-02-22 DIAGNOSIS — M5416 Radiculopathy, lumbar region: Secondary | ICD-10-CM | POA: Diagnosis not present

## 2020-02-22 DIAGNOSIS — J0100 Acute maxillary sinusitis, unspecified: Secondary | ICD-10-CM | POA: Diagnosis not present

## 2020-02-27 ENCOUNTER — Ambulatory Visit: Payer: Medicaid Other | Admitting: Internal Medicine

## 2020-02-27 ENCOUNTER — Encounter: Payer: Self-pay | Admitting: Internal Medicine

## 2020-02-27 ENCOUNTER — Other Ambulatory Visit: Payer: Self-pay

## 2020-02-27 VITALS — BP 150/80 | HR 92 | Ht 61.0 in | Wt 140.2 lb

## 2020-02-27 DIAGNOSIS — M5418 Radiculopathy, sacral and sacrococcygeal region: Secondary | ICD-10-CM | POA: Diagnosis not present

## 2020-02-27 DIAGNOSIS — M9902 Segmental and somatic dysfunction of thoracic region: Secondary | ICD-10-CM | POA: Diagnosis not present

## 2020-02-27 DIAGNOSIS — M9903 Segmental and somatic dysfunction of lumbar region: Secondary | ICD-10-CM | POA: Diagnosis not present

## 2020-02-27 DIAGNOSIS — M5414 Radiculopathy, thoracic region: Secondary | ICD-10-CM | POA: Diagnosis not present

## 2020-02-27 DIAGNOSIS — M9904 Segmental and somatic dysfunction of sacral region: Secondary | ICD-10-CM | POA: Diagnosis not present

## 2020-02-27 DIAGNOSIS — I441 Atrioventricular block, second degree: Secondary | ICD-10-CM

## 2020-02-27 DIAGNOSIS — M5416 Radiculopathy, lumbar region: Secondary | ICD-10-CM | POA: Diagnosis not present

## 2020-02-27 NOTE — Patient Instructions (Addendum)
Medication Instructions:  Your physician recommends that you continue on your current medications as directed. Please refer to the Current Medication list given to you today.  Labwork: None ordered.  Testing/Procedures: None ordered.  Follow-Up: Your physician wants you to follow-up in: as needed with Dr. Taylor.      Any Other Special Instructions Will Be Listed Below (If Applicable).  If you need a refill on your cardiac medications before your next appointment, please call your pharmacy.   

## 2020-02-27 NOTE — Progress Notes (Signed)
HPI Diana [name] is referred today by Dr. Harriet Masson for evaluation of NS SVT and transient AV block. 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 She was noted to have a preoperative evaluation which was notable for dizzy spells. She was found on cardiac monitoring to have transient AV block at night and during the daytime. Her atenolol was stopped and her dizzy spells have essentially resolved. She does have ILBBB. She was noted on her monitor to also have NS SVT, likely atrial tachycardia. I have reviewed her other past medical history which is notable for HTN. The patient denies frank syncope. She smokes cigarettes but is down to less than a ppd.  Allergies  Allergen Reactions  . Penicillins Anaphylaxis  . Demerol [Meperidine] Nausea And Vomiting  . Doxycycline Hyclate Nausea And Vomiting  . Gabapentin Other (See Comments)    Malaise  . Sulfa Antibiotics Hives  . Levaquin [Levofloxacin]     Reported some possible facial swelling with administration 05/2018  . Benadryl [Diphenhydramine Hcl] Rash  . Buprenorphine Hcl Nausea And Vomiting  . Cephalexin Rash  . Morphine And Related Nausea And Vomiting  . Spiriva Handihaler [Tiotropium Bromide Monohydrate] Rash     Current Outpatient Medications  Medication Sig Dispense Refill  . albuterol (PROVENTIL HFA;VENTOLIN HFA) 108 (90 Base) MCG/ACT inhaler Inhale 2 puffs into the lungs every 6 (six) hours as needed for wheezing. 8.5 g 5  . allopurinol (ZYLOPRIM) 100 MG tablet Take 1 tablet (100 mg total) by mouth daily. 30 tablet 6  . amLODipine (NORVASC) 5 MG tablet Take 1 tablet by mouth once daily 90 tablet 3  . calcium-vitamin D (OSCAL WITH D) 500-200 MG-UNIT TABS tablet Take by mouth.    . Colchicine 0.6 MG CAPS Take 1 capsule by mouth daily. 30 capsule 6  . cycloSPORINE (RESTASIS) 0.05 % ophthalmic emulsion Place 1 drop into both eyes 2 (two) times daily. 0.4  mL 3  . diclofenac (VOLTAREN) 75 MG EC tablet TAKE 1 TABLET BY MOUTH TWICE DAILY AFTER MEALS    . diclofenac sodium (VOLTAREN) 1 % GEL Apply 2 g topically 4 (four) times daily as needed. 100 g 5  . dicyclomine (BENTYL) 10 MG capsule Take 1 capsule (10 mg total) by mouth 2 (two) times daily as needed for spasms. 40 capsule 3  . esomeprazole (NEXIUM) 40 MG capsule Take 1 capsule (40 mg total) by mouth daily at 12 noon. 90 capsule 1  . ezetimibe (ZETIA) 10 MG tablet Take 1 tablet (10 mg total) by mouth daily. 90 tablet 3  . ibuprofen (ADVIL) 800 MG tablet TAKE 1 TABLET BY MOUTH EVERY 8 HOURS WITH FOOD    . linaclotide (LINZESS) 145 MCG CAPS capsule Take 1 capsule (145 mcg total) by mouth daily before breakfast. 30 capsule 5  . losartan (COZAAR) 25 MG tablet Take 1 tablet (25 mg total) by mouth daily. 90 tablet 1  . methocarbamol (ROBAXIN) 500 MG tablet TAKE 1 TO 2 TABLETS BY MOUTH EVERY 6 HOURS AS NEEDED FOR SPASMS    . Pitavastatin Calcium (LIVALO) 4 MG TABS Take 1 tablet (4 mg total) by mouth at bedtime. 90 tablet 3  . Tiotropium Bromide-Olodaterol (STIOLTO RESPIMAT) 2.5-2.5 MCG/ACT AERS Inhale 2 puffs into the lungs daily. 2 Inhaler 3   No current facility-administered medications for this visit.     Past Medical  History:  Diagnosis Date  . Anxiety   . Asthma   . Barrett esophagus    "don't know that I've ever been stretched" (08/17/2013)  . Choledocholithiasis with acute cholecystitis   . Chronic lower back pain   . COPD (chronic obstructive pulmonary disease) ([place])   . Craniosynostosis    congenital  . DDD (degenerative disc disease)   . Family history of anesthesia complication    "PONV; both parents" (08/17/2013)  . GERD (gastroesophageal reflux disease)   . H/O hiatal hernia   . History of blood transfusion 1959-1960  . Hollenhorst plaque    Left eye  . Hypercholesterolemia 05/28/2012   Has taken Crestor and Lipitor in the past. Switched to lovastatin b/c its $4.    .  Hypertension   . IBS (irritable bowel syndrome)   . Migraines    "since age 76; probably twice/month" (08/17/2013)  . PONV (postoperative nausea and vomiting)   . Rheumatoid arthritis ([place])    "in my knuckles" (08/17/2013)  . Ruptured lumbar disc 1990's   L4-L5  . Shingles     ROS:   All systems reviewed and negative except as noted in the HPI.   Past Surgical History:  Procedure Laterality Date  . ABDOMINAL HYSTERECTOMY  1992   Total for chronic pelvic pain   . APPENDECTOMY    . BACK SURGERY  1990's   L4-L5 fusion @ [facility]  05/28/2019   fused C4-5-6, done in [place]  . CHOLECYSTECTOMY  2007   Performed in [place]   . COLONOSCOPY    . CRANIECTOMY FOR CRANIOSYNOSTOSIS  [id number]   "1st time got infected; 2nd time didn't take; fix the 3rd time" (08/17/2013)  . POSTERIOR LUMBAR FUSION  1990's   "took piece off my hip" (08/17/2013)  . TOTAL HIP ARTHROPLASTY Right 12/31/04   AVN after fall injury; performed by Ralene Cork; DePuy S-ROM total hip (metal on metal)  . UPPER GASTROINTESTINAL ENDOSCOPY       Family History  Problem Relation Age of Onset  . Hypertension Mother   . Hypertension Father   . Skin cancer Father   . Colon cancer Paternal Grandfather   . Rectal cancer Neg Hx   . Stomach cancer Neg Hx   . Esophageal cancer Neg Hx      Social History   Socioeconomic History  . Marital status: Divorced    Spouse name: Not on file  . Number of children: 2  . Years of education: Not on file  . Highest education level: Not on file  Occupational History  . Occupation: retired  Tobacco Use  . Smoking status: Current Every Day Smoker    Packs/day: 0.50    Years: 32.00    Pack years: 16.00    Types: Cigarettes  . Smokeless tobacco: Never Used  . Tobacco comment: on chanix  Substance and Sexual Activity  . Alcohol use: Yes    Alcohol/week: 0.0 standard drinks    Comment: occasionally  . Drug use: No  . Sexual  activity: Not Currently  Other Topics Concern  . Not on file  Social History Narrative  . Not on file   Social Determinants of Health   Financial Resource Strain:   . Difficulty of Paying Living Expenses:   Food Insecurity:   . Worried About Charity fundraiser in the Last Year:   . Arboriculturist in the Last Year:   Transportation Needs:   .  Lack of Transportation (Medical):   Marland Kitchen Lack of Transportation (Non-Medical):   Physical Activity:   . Days of Exercise per Week:   . Minutes of Exercise per Session:   Stress:   . Feeling of Stress :   Social Connections:   . Frequency of Communication with Friends and Family:   . Frequency of Social Gatherings with Friends and Family:   . Attends Religious Services:   . Active Member of Clubs or Organizations:   . Attends Archivist Meetings:   Marland Kitchen Marital Status:   Intimate Partner Violence:   . Fear of Current or Ex-Partner:   . Emotionally Abused:   Marland Kitchen Physically Abused:   . Sexually Abused:      BP (!) 150/80   Pulse 92   Ht 5\' 1"  (1.549 m)   Wt 140 lb 3.2 oz (63.6 kg)   SpO2 97%   BMI 26.49 kg/m   Physical Exam:  Well appearing NAD HEENT: Unremarkable Neck:  No JVD, no thyromegally Lymphatics:  No adenopathy Back:  No CVA tenderness Lungs:  Scattered rales and rhonchi HEART:  Regular rate rhythm, no murmurs, no rubs, no clicks Abd:  soft, positive bowel sounds, no organomegally, no rebound, no guarding Ext:  2 plus pulses, no edema, no cyanosis, no clubbing Skin:  No rashes no nodules Neuro:  CN II through XII intact, motor grossly intact  EKG - nsr with ILBBB Cardiac monitor - NSR with transient mobitz 2, second degree AV block  Assess/Plan: 1. Preoperative eval. - from a heart rhythm perspective, she is a low risk for arrhytmia complications from pending eye surgery.  2. Heart block - her symptoms are much improved after stopping her beta blocker and switching to amlodipine. I discussed the symptoms  she might experience if her conduction disease was to worsen and she will call us if she becomes dizzy, passes out or notices a change in her vision. 3. HTN - her SBP is elevated. She may require either more amlodipine or an uptitration of her losartan or both.    Mikle Bosworth.D.

## 2020-02-28 ENCOUNTER — Telehealth: Payer: Self-pay | Admitting: *Deleted

## 2020-02-28 NOTE — Telephone Encounter (Signed)
   [facility] Pre-operative Risk Assessment    Request for surgical clearance: LOOKS LIKE PT WAS RECENTLY SEEN BY KARDI [name], DO.   1. What type of surgery is being performed? 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 When is this surgery scheduled? TBD   3. What type of clearance is required (medical clearance vs. Pharmacy clearance to hold med vs. Both)? MEDICAL  4. Are there any medications that need to be held prior to surgery and how long? NONE LISTED   5. Practice name and name of physician performing surgery? [facility]; DR. Randell Loop   6. What is your office phone number [phone]    7.   What is your office fax number [phone]  8.   Anesthesia type (None, local, MAC, general) ? GENERAL   Diana [name] 02/28/2020, 3:55 PM  _________________________________________________________________   (provider comments below)

## 2020-02-28 NOTE — Telephone Encounter (Signed)
Saw Dr. Lovena Le on 02/27/2020 for pauses. She was taken off of propranolol by Dr. Sherren Mocha. Will wait for his note to make recommendations concerning the clearance. Dr. Sherren Mocha indicated that she spoke with opthamology by phone.

## 2020-02-29 ENCOUNTER — Telehealth: Payer: Self-pay | Admitting: *Deleted

## 2020-02-29 DIAGNOSIS — M9903 Segmental and somatic dysfunction of lumbar region: Secondary | ICD-10-CM | POA: Diagnosis not present

## 2020-02-29 DIAGNOSIS — M5414 Radiculopathy, thoracic region: Secondary | ICD-10-CM | POA: Diagnosis not present

## 2020-02-29 DIAGNOSIS — M5418 Radiculopathy, sacral and sacrococcygeal region: Secondary | ICD-10-CM | POA: Diagnosis not present

## 2020-02-29 DIAGNOSIS — M5416 Radiculopathy, lumbar region: Secondary | ICD-10-CM | POA: Diagnosis not present

## 2020-02-29 DIAGNOSIS — M9904 Segmental and somatic dysfunction of sacral region: Secondary | ICD-10-CM | POA: Diagnosis not present

## 2020-02-29 DIAGNOSIS — M9902 Segmental and somatic dysfunction of thoracic region: Secondary | ICD-10-CM | POA: Diagnosis not present

## 2020-02-29 NOTE — Telephone Encounter (Signed)
   Primary Cardiologist: Berniece Salines, DO  Chart reviewed as part of pre-operative protocol coverage. Given past medical history and time since last visit, based on ACC/AHA guidelines, Diana [name] would be at acceptable risk for the planned procedure without further cardiovascular testing.   Saw Dr.Taylor on 02/27/2020. " Preoperative eval. - from a heart rhythm perspective, she is a low risk for arrhytmia complications from pending eye surgery.".   I will route this recommendation to the requesting party via Epic fax function and remove from pre-op pool.  Please call with questions.  Phill Myron. [place] Pugh, ANP, AACC  02/29/2020, 9:19 AM

## 2020-02-29 NOTE — Telephone Encounter (Signed)
Left the pt a message to call the office back and request to speak with a triage nurse, to discuss surgical clearance on behalf of Dr. Harriet Masson.

## 2020-02-29 NOTE — Telephone Encounter (Signed)
-----   Message from Berniece Salines, DO sent at 02/29/2020 12:53 PM EDT ----- Please let the patient know that I will be able to clear her for her surgery.  I will also let her ophthalmologist know.

## 2020-03-05 DIAGNOSIS — M5414 Radiculopathy, thoracic region: Secondary | ICD-10-CM | POA: Diagnosis not present

## 2020-03-05 DIAGNOSIS — M5416 Radiculopathy, lumbar region: Secondary | ICD-10-CM | POA: Diagnosis not present

## 2020-03-05 DIAGNOSIS — M9903 Segmental and somatic dysfunction of lumbar region: Secondary | ICD-10-CM | POA: Diagnosis not present

## 2020-03-05 DIAGNOSIS — M9902 Segmental and somatic dysfunction of thoracic region: Secondary | ICD-10-CM | POA: Diagnosis not present

## 2020-03-05 DIAGNOSIS — M9904 Segmental and somatic dysfunction of sacral region: Secondary | ICD-10-CM | POA: Diagnosis not present

## 2020-03-05 DIAGNOSIS — M5418 Radiculopathy, sacral and sacrococcygeal region: Secondary | ICD-10-CM | POA: Diagnosis not present

## 2020-03-16 ENCOUNTER — Other Ambulatory Visit: Payer: Self-pay

## 2020-03-16 ENCOUNTER — Ambulatory Visit: Payer: Medicaid Other | Admitting: Cardiology

## 2020-03-16 ENCOUNTER — Encounter: Payer: Self-pay | Admitting: Cardiology

## 2020-03-16 VITALS — BP 130/72 | HR 95 | Ht 61.0 in | Wt 137.0 lb

## 2020-03-16 DIAGNOSIS — E7849 Other hyperlipidemia: Secondary | ICD-10-CM | POA: Diagnosis not present

## 2020-03-16 DIAGNOSIS — Z720 Tobacco use: Secondary | ICD-10-CM | POA: Diagnosis not present

## 2020-03-16 DIAGNOSIS — M7989 Other specified soft tissue disorders: Secondary | ICD-10-CM | POA: Diagnosis not present

## 2020-03-16 DIAGNOSIS — H34212 Partial retinal artery occlusion, left eye: Secondary | ICD-10-CM

## 2020-03-16 DIAGNOSIS — I441 Atrioventricular block, second degree: Secondary | ICD-10-CM

## 2020-03-16 NOTE — Patient Instructions (Signed)
Medication Instructions:   Your physician recommends that you continue on your current medications as directed. Please refer to the Current Medication list given to you today.  *If you need a refill on your cardiac medications before your next appointment, please call your pharmacy*   Lab Work: BMET  AND [place]   If you have labs (blood work) drawn today and your tests are completely normal, you will receive your results only by: Marland Kitchen MyChart Message (if you have MyChart) OR . A paper copy in the mail If you have any lab test that is abnormal or we need to change your treatment, we will call you to review the results.   Testing/Procedures: Your physician has requested that you have a lower or upper extremity venous duplex. This test is an ultrasound of the veins in the legs or arms. It looks at venous blood flow that carries blood from the heart to the legs or arms. Allow one hour for a Lower Venous exam. Allow thirty minutes for an Upper Venous exam. There are no restrictions or special instructions.   Follow-Up: At [facility], you and your health needs are our priority.  As part of our continuing mission to provide you with exceptional heart care, we have created designated Provider Care Teams.  These Care Teams include your primary Cardiologist (physician) and Advanced Practice Providers (APPs -  Physician Assistants and Nurse Practitioners) who all work together to provide you with the care you need, when you need it.  We recommend signing up for the patient portal called "MyChart".  Sign up information is provided on this After Visit Summary.  MyChart is used to connect with patients for Virtual Visits (Telemedicine).  Patients are able to view lab/test results, encounter notes, upcoming appointments, etc.  Non-urgent messages can be sent to your provider as well.   To learn more about what you can do with MyChart, go to [email or web address].    Your next appointment:   3  month(s)  The format for your next appointment:   In Person  Provider:   You will see Berniece Salines, DO.  Or, you can be scheduled with the following Advanced Practice Provider on your designated Care Team (at our [facility]):  Laurann Montana, FNP     Other Instructions

## 2020-03-16 NOTE — Progress Notes (Signed)
Cardiology Office Note:    Date:  03/16/2020   ID:  Diana [name], DOB [birth year], MRN [id number]  PCP:  Cleophas Dunker, DO  Cardiologist:  Berniece Salines, DO  Electrophysiologist:  None   Referring MD: Cleophas Dunker, *   Chief Complaint  Patient presents with  . Follow-up   History of Present Illness:    Diana [name] a 64 y.o.femalewith a hx of Hollenhorst plaque,hyperlipidemia, hypercholesterolemia, hypertension and tobacco abuse. I saw the patient on 02/10/2020 at that time we discussed her monitor result, and due to the evidence of second degree AV block, I stopped her beta-blocker and referred her to EP.  In the meantime we held her surgery once she was evaluated by EP.  Due to her improvement with normal repeat of AV block after her beta-blocker there is no pursuit of any further diagnostic testing.  She is here for follow-up visit.  She tells me that she has been experiencing right leg swelling and pain.  She reports that this is more recent and she has been concerned about her legs.  As she wakes up in the night with significant cramping sensation of that leg.  No other complaints at this time-no chest pain, no shortness of breath.   Past Medical History:  Diagnosis Date  . Anxiety   . Asthma   . Barrett esophagus    "don't know that I've ever been stretched" (08/17/2013)  . Choledocholithiasis with acute cholecystitis   . Chronic lower back pain   . COPD (chronic obstructive pulmonary disease) ([place])   . Craniosynostosis    congenital  . DDD (degenerative disc disease)   . Family history of anesthesia complication    "PONV; both parents" (08/17/2013)  . GERD (gastroesophageal reflux disease)   . H/O hiatal hernia   . History of blood transfusion 1959-1960  . Hollenhorst plaque    Left eye  . Hypercholesterolemia 05/28/2012   Has taken Crestor and Lipitor in the past. Switched to lovastatin b/c its $4.    . Hypertension   . IBS (irritable bowel  syndrome)   . Migraines    "since age 29; probably twice/month" (08/17/2013)  . PONV (postoperative nausea and vomiting)   . Rheumatoid arthritis ([place])    "in my knuckles" (08/17/2013)  . Ruptured lumbar disc 1990's   L4-L5  . Shingles     Past Surgical History:  Procedure Laterality Date  . ABDOMINAL HYSTERECTOMY  1992   Total for chronic pelvic pain   . APPENDECTOMY    . BACK SURGERY  1990's   L4-L5 fusion @ [facility]  05/28/2019   fused C4-5-6, done in [place]  . CHOLECYSTECTOMY  2007   Performed in [place]   . COLONOSCOPY    . CRANIECTOMY FOR CRANIOSYNOSTOSIS  [id number]   "1st time got infected; 2nd time didn't take; fix the 3rd time" (08/17/2013)  . POSTERIOR LUMBAR FUSION  1990's   "took piece off my hip" (08/17/2013)  . TOTAL HIP ARTHROPLASTY Right 12/31/04   AVN after fall injury; performed by Ralene Cork; DePuy S-ROM total hip (metal on metal)  . UPPER GASTROINTESTINAL ENDOSCOPY      Current Medications: Current Meds  Medication Sig  . albuterol (PROVENTIL HFA;VENTOLIN HFA) 108 (90 Base) MCG/ACT inhaler Inhale 2 puffs into the lungs every 6 (six) hours as needed for wheezing.  Marland Kitchen allopurinol (ZYLOPRIM) 100 MG tablet Take 1 tablet (100 mg total) by mouth daily.  Marland Kitchen  amLODipine (NORVASC) 5 MG tablet Take 1 tablet by mouth once daily  . calcium-vitamin D (OSCAL WITH D) 500-200 MG-UNIT TABS tablet Take by mouth.  . Colchicine 0.6 MG CAPS Take 1 capsule by mouth daily.  . cycloSPORINE (RESTASIS) 0.05 % ophthalmic emulsion Place 1 drop into both eyes 2 (two) times daily.  . diclofenac (VOLTAREN) 75 MG EC tablet TAKE 1 TABLET BY MOUTH TWICE DAILY AFTER MEALS  . diclofenac sodium (VOLTAREN) 1 % GEL Apply 2 g topically 4 (four) times daily as needed.  . dicyclomine (BENTYL) 10 MG capsule Take 1 capsule (10 mg total) by mouth 2 (two) times daily as needed for spasms.  Marland Kitchen esomeprazole (NEXIUM) 40 MG capsule Take 1 capsule (40 mg total) by  mouth daily at 12 noon.  . ezetimibe (ZETIA) 10 MG tablet Take 1 tablet (10 mg total) by mouth daily.  Marland Kitchen ibuprofen (ADVIL) 800 MG tablet TAKE 1 TABLET BY MOUTH EVERY 8 HOURS WITH FOOD  . linaclotide (LINZESS) 145 MCG CAPS capsule Take 1 capsule (145 mcg total) by mouth daily before breakfast.  . losartan (COZAAR) 25 MG tablet Take 1 tablet (25 mg total) by mouth daily.  . methocarbamol (ROBAXIN) 500 MG tablet TAKE 1 TO 2 TABLETS BY MOUTH EVERY 6 HOURS AS NEEDED FOR SPASMS  . Pitavastatin Calcium (LIVALO) 4 MG TABS Take 1 tablet (4 mg total) by mouth at bedtime.  . Tiotropium Bromide-Olodaterol (STIOLTO RESPIMAT) 2.5-2.5 MCG/ACT AERS Inhale 2 puffs into the lungs daily.     Allergies:   Penicillins, Demerol [meperidine], Doxycycline hyclate, Gabapentin, Sulfa antibiotics, Levaquin [levofloxacin], Benadryl [diphenhydramine hcl], Buprenorphine hcl, Cephalexin, Morphine and related, and Spiriva handihaler [tiotropium bromide monohydrate]   Social History   Socioeconomic History  . Marital status: Divorced    Spouse name: Not on file  . Number of children: 2  . Years of education: Not on file  . Highest education level: Not on file  Occupational History  . Occupation: retired  Tobacco Use  . Smoking status: Current Every Day Smoker    Packs/day: 0.50    Years: 32.00    Pack years: 16.00    Types: Cigarettes  . Smokeless tobacco: Never Used  . Tobacco comment: on chanix  Substance and Sexual Activity  . Alcohol use: Yes    Alcohol/week: 0.0 standard drinks    Comment: occasionally  . Drug use: No  . Sexual activity: Not Currently  Other Topics Concern  . Not on file  Social History Narrative  . Not on file   Social Determinants of Health   Financial Resource Strain:   . Difficulty of Paying Living Expenses:   Food Insecurity:   . Worried About Charity fundraiser in the Last Year:   . Arboriculturist in the Last Year:   Transportation Needs:   . Film/video editor  (Medical):   Marland Kitchen Lack of Transportation (Non-Medical):   Physical Activity:   . Days of Exercise per Week:   . Minutes of Exercise per Session:   Stress:   . Feeling of Stress :   Social Connections:   . Frequency of Communication with Friends and Family:   . Frequency of Social Gatherings with Friends and Family:   . Attends Religious Services:   . Active Member of Clubs or Organizations:   . Attends Archivist Meetings:   Marland Kitchen Marital Status:      Family History: The patient's family history includes Colon cancer in her paternal  grandfather; Hypertension in her father and mother; Skin cancer in her father. There is no history of Rectal cancer, Stomach cancer, or Esophageal cancer.  ROS:   Review of Systems  Constitution: Negative for decreased appetite, fever and weight gain.  HENT: Negative for congestion, ear discharge, hoarse voice and sore throat.   Eyes: Negative for discharge, redness, vision loss in right eye and visual halos.  Cardiovascular: Negative for chest pain, dyspnea on exertion, leg swelling, orthopnea and palpitations.  Respiratory: Negative for cough, hemoptysis, shortness of breath and snoring.   Endocrine: Negative for heat intolerance and polyphagia.  Hematologic/Lymphatic: Negative for bleeding problem. Does not bruise/bleed easily.  Skin: Negative for flushing, nail changes, rash and suspicious lesions.  Musculoskeletal: Negative for arthritis, joint pain, muscle cramps, myalgias, neck pain and stiffness.  Gastrointestinal: Negative for abdominal pain, bowel incontinence, diarrhea and excessive appetite.  Genitourinary: Negative for decreased libido, genital sores and incomplete emptying.  Neurological: Negative for brief paralysis, focal weakness, headaches and loss of balance.  Psychiatric/Behavioral: Negative for altered mental status, depression and suicidal ideas.  Allergic/Immunologic: Negative for HIV exposure and persistent infections.     EKGs/Labs/Other Studies Reviewed:    The following studies were reviewed today:   EKG: None today   US carotid Summary:  Right Carotid: Velocities in the right ICA are consistent with a 60-79% stenosis. The ECA appears >50% stenosed. Low end 60% stenosis.  Left Carotid: Velocities in the left ICA are consistent with a 60-79%  stenosis. The ECA appears >50% stenosed. Vertebrals: Bilateral vertebral arteries demonstrate antegrade flow.  Subclavians: Normal flow hemodynamics were seen in bilateral subclavian  arteries.   TTE IMPRESSIONS 01/18/20 1. Left ventricular ejection fraction, by estimation, is 55 to 60%. The left ventricle has normal function. The left ventricle has no regional wall motion abnormalities. Left ventricular diastolic parameters are  consistent with Grade I diastolic dysfunction (impaired relaxation).  2. Right ventricular systolic function is normal. The right ventricular size is normal.  3. The mitral valve is normal in structure and function. No evidence of  mitral valve regurgitation. No evidence of mitral stenosis.  4. The aortic valve is normal in structure and function. Aortic valve regurgitation is not visualized. No aortic stenosis is present.  5. There is no obvious atheromatous plaque identified in the visible portions of the ascending aorta or aortic arch.  6. The inferior vena cava is normal in size with greater than 50% respiratory variability, suggesting right atrial pressure of 3 mmHg.   Zio monitor  The patient wore the monitor for 6 days 20 hours starting 01/17/2020. Indication: Dizziness The minimum heart rate was 29 bpm, maximum heart rate was 150 bpm, and average heart rate was 77 bpm. Predominant underlying rhythm was Sinus Rhythm.  First Degree AV Block was present. Bundle Branch Block/IVCD was present.  6 Supraventricular Tachycardia runs occurred, the run with the fastest interval lasting 5 beats with a maximum rate of 150 bpm, the  longest lasting 10 beats with an average rate of 94 bpm.   1 episode of AV Block (2nd) occurred, lasting a total of 3 seconds. Premature atrial complexes were rare (less than 1%). Premature Ventricular complexes were rare (less than 1%). No ventricular tachycardia, No third-degree AV block and no atrial fibrillation present. 4 patient triggered events noted or associated with premature atrial complexes. Conclusion: This study is remarkable for the following.  1.  Second-degree (2-1) AV block, lasting 3 seconds.                             2.  Asymptomatic paroxysmal supraventricular tachycardia.                             3.  Rare symptomatic premature atrial complexes.   Recent Labs: 11/15/2019: Platelets 227 01/13/2020: ALT 15; Hemoglobin 12.8 01/17/2020: TSH 2.810 02/10/2020: BUN 6; Creatinine, Ser 0.81; Magnesium 2.1; Potassium 4.0; Sodium 139  Recent Lipid Panel    Component Value Date/Time   CHOL 252 (H) 01/13/2020 1012   TRIG 150 (H) 01/13/2020 1012   HDL 60 01/13/2020 1012   CHOLHDL 4.2 01/13/2020 1012   CHOLHDL 5.7 04/20/2015 1138   VLDL 34 04/20/2015 1138   LDLCALC 165 (H) 01/13/2020 1012   LDLDIRECT 246 (H) 10/31/2015 1444    Physical Exam:    VS:  BP 130/72 (BP Location: Right Arm, Patient Position: Sitting, Cuff Size: Normal)   Pulse 95   Ht 5\' 1"  (1.549 m)   Wt 137 lb (62.1 kg)   SpO2 95%   BMI 25.89 kg/m     Wt Readings from Last 3 Encounters:  03/16/20 137 lb (62.1 kg)  02/27/20 140 lb 3.2 oz (63.6 kg)  02/10/20 141 lb (64 kg)     GEN: Well nourished, well developed in no acute distress HEENT: Normal NECK: No JVD; No carotid bruits LYMPHATICS: No lymphadenopathy CARDIAC: S1S2 noted,RRR, no murmurs, rubs, gallops RESPIRATORY:  Clear to auscultation without rales, wheezing or rhonchi  ABDOMEN: Soft, non-tender, non-distended, +bowel sounds, no guarding. EXTREMITIES: No edema, No cyanosis, no clubbing MUSCULOSKELETAL:   No deformity  SKIN: Warm and dry NEUROLOGIC:  Alert and oriented x 3, non-focal PSYCHIATRIC:  Normal affect, good insight  ASSESSMENT:    1. Right leg swelling   2. Familial hyperlipidemia   3. Second degree AV block   4. Tobacco abuse   5. Hollenhorst plaque, left eye    PLAN:     1. Her leg swelling is concerning- I will like to rule out DVT of her leg. We will get UD duplex of her lower extremities. In the meantime, we will get blood work to check her kidneys and electrolytes.   2. She is on livalo and zetia. Her Lpa was elevated. She is a candidate for pcsk9 inhibitors. I will wait after her eye surgery before the start of this medication given that it is an injectable. I discuss with her and she is ok with this plan.   3. She has had improvement off beta blocker.   4. Hollenhorst plaque- opthalmologic surgery schedule on 03/27/2020. For a cardiovascular standpoint she can proceed with her surgery.  5. Carotid artery stenosis - continue current medication reigmen.   The patient is in agreement with the above plan. The patient left the office in stable condition.  The patient will follow up in 3 months or sooner if needed.   Medication Adjustments/Labs and Tests Ordered: Current medicines are reviewed at length with the patient today.  Concerns regarding medicines are outlined above.  Orders Placed This Encounter  Procedures  . Basic metabolic panel  . Magnesium  . VAS [place] LOWER EXTREMITY VENOUS (DVT)   No orders of the defined types were placed in this encounter.   Patient Instructions  Medication Instructions:   Your physician  recommends that you continue on your current medications as directed. Please refer to the Current Medication list given to you today.  *If you need a refill on your cardiac medications before your next appointment, please call your pharmacy*   Lab Work: BMET  AND [place]   If you have labs (blood work) drawn today and your tests are  completely normal, you will receive your results only by: Marland Kitchen MyChart Message (if you have MyChart) OR . A paper copy in the mail If you have any lab test that is abnormal or we need to change your treatment, we will call you to review the results.   Testing/Procedures: Your physician has requested that you have a lower or upper extremity venous duplex. This test is an ultrasound of the veins in the legs or arms. It looks at venous blood flow that carries blood from the heart to the legs or arms. Allow one hour for a Lower Venous exam. Allow thirty minutes for an Upper Venous exam. There are no restrictions or special instructions.   Follow-Up: At [facility], you and your health needs are our priority.  As part of our continuing mission to provide you with exceptional heart care, we have created designated Provider Care Teams.  These Care Teams include your primary Cardiologist (physician) and Advanced Practice Providers (APPs -  Physician Assistants and Nurse Practitioners) who all work together to provide you with the care you need, when you need it.  We recommend signing up for the patient portal called "MyChart".  Sign up information is provided on this After Visit Summary.  MyChart is used to connect with patients for Virtual Visits (Telemedicine).  Patients are able to view lab/test results, encounter notes, upcoming appointments, etc.  Non-urgent messages can be sent to your provider as well.   To learn more about what you can do with MyChart, go to [email or web address].    Your next appointment:   3 month(s)  The format for your next appointment:   In Person  Provider:   You will see Berniece Salines, DO.  Or, you can be scheduled with the following Advanced Practice Provider on your designated Care Team (at our [facility]):  Laurann Montana, FNP     Other Instructions      Adopting a Healthy Lifestyle.  Know what a healthy weight is for you (roughly BMI <25) and aim  to maintain this   Aim for 7+ servings of fruits and vegetables daily   65-80+ fluid ounces of water or unsweet tea for healthy kidneys   Limit to max 1 drink of alcohol per day; avoid smoking/tobacco   Limit animal fats in diet for cholesterol and heart health - choose grass fed whenever available   Avoid highly processed foods, and foods high in saturated/trans fats   Aim for low stress - take time to unwind and care for your mental health   Aim for 150 min of moderate intensity exercise weekly for heart health, and weights twice weekly for bone health   Aim for 7-9 hours of sleep daily   When it comes to diets, agreement about the perfect plan isnt easy to find, even among the experts. Experts at the [place] [employer] developed an idea known as the Healthy Eating Plate. Just imagine a plate divided into logical, healthy portions.   The emphasis is on diet quality:   Load up on vegetables and fruits - one-half of your plate: Aim for color  and variety, and remember that potatoes dont count.   Go for whole grains - one-quarter of your plate: Whole wheat, barley, wheat berries, quinoa, oats, brown rice, and foods made with them. If you want pasta, go with whole wheat pasta.   Protein power - one-quarter of your plate: Fish, chicken, beans, and nuts are all healthy, versatile protein sources. Limit red meat.   The diet, however, does go beyond the plate, offering a few other suggestions.   Use healthy plant oils, such as olive, canola, soy, corn, sunflower and peanut. Check the labels, and avoid partially hydrogenated oil, which have unhealthy trans fats.   If youre thirsty, drink water. Coffee and tea are good in moderation, but skip sugary drinks and limit milk and dairy products to one or two daily servings.   The type of carbohydrate in the diet is more important than the amount. Some sources of carbohydrates, such as vegetables, fruits, whole grains, and  beans-are healthier than others.   Finally, stay active  Signed, Berniece Salines, DO  03/16/2020 10:18 PM    [facility]

## 2020-03-17 LAB — BASIC METABOLIC PANEL
BUN/Creatinine Ratio: 9 — ABNORMAL LOW (ref 12–28)
BUN: 8 mg/dL (ref 8–27)
CO2: 25 mmol/L (ref 20–29)
Calcium: 9.4 mg/dL (ref 8.7–10.3)
Chloride: 96 mmol/L (ref 96–106)
Creatinine, Ser: 0.91 mg/dL (ref 0.57–1.00)
GFR calc Af Amer: 79 mL/min/{1.73_m2} (ref >59)
GFR calc non Af Amer: 68 mL/min/{1.73_m2} (ref >59)
Glucose: 94 mg/dL (ref 65–99)
Potassium: 3.7 mmol/L (ref 3.5–5.2)
Sodium: 133 mmol/L — ABNORMAL LOW (ref 134–144)

## 2020-03-17 LAB — MAGNESIUM: Magnesium: 2.1 mg/dL (ref 1.6–2.3)

## 2020-03-20 ENCOUNTER — Ambulatory Visit (HOSPITAL_BASED_OUTPATIENT_CLINIC_OR_DEPARTMENT_OTHER): Payer: Medicaid Other

## 2020-03-20 ENCOUNTER — Ambulatory Visit (INDEPENDENT_AMBULATORY_CARE_PROVIDER_SITE_OTHER): Payer: Medicaid Other | Admitting: Family Medicine

## 2020-03-20 ENCOUNTER — Ambulatory Visit (HOSPITAL_BASED_OUTPATIENT_CLINIC_OR_DEPARTMENT_OTHER)
Admission: RE | Admit: 2020-03-20 | Discharge: 2020-03-20 | Disposition: A | Discharge status: Home / Self Care | Payer: Medicaid Other | Source: Ambulatory Visit | Attending: Cardiology | Admitting: Cardiology

## 2020-03-20 ENCOUNTER — Encounter: Payer: Self-pay | Admitting: Family Medicine

## 2020-03-20 ENCOUNTER — Other Ambulatory Visit: Payer: Self-pay

## 2020-03-20 VITALS — BP 126/80 | HR 102 | Ht 61.0 in | Wt 136.2 lb

## 2020-03-20 DIAGNOSIS — M7989 Other specified soft tissue disorders: Secondary | ICD-10-CM | POA: Insufficient documentation

## 2020-03-20 DIAGNOSIS — F419 Anxiety disorder, unspecified: Secondary | ICD-10-CM | POA: Diagnosis not present

## 2020-03-20 DIAGNOSIS — M79604 Pain in right leg: Secondary | ICD-10-CM

## 2020-03-20 DIAGNOSIS — G4739 Other sleep apnea: Secondary | ICD-10-CM

## 2020-03-20 DIAGNOSIS — K581 Irritable bowel syndrome with constipation: Secondary | ICD-10-CM

## 2020-03-20 HISTORY — DX: Other sleep apnea: G47.39

## 2020-03-20 MED ORDER — LINACLOTIDE 145 MCG PO CAPS: 145.0000 ug | ORAL_CAPSULE | Freq: Every day | ORAL | 5 refills | Status: DC

## 2020-03-20 MED ORDER — VENLAFAXINE HCL ER 37.5 MG PO CP24: 37.5000 mg | ORAL_CAPSULE | Freq: Every day | ORAL | 0 refills | Status: DC

## 2020-03-20 NOTE — Assessment & Plan Note (Signed)
Previously seen by GI.  Has been well controlled on Linzess.  Refill sent.

## 2020-03-20 NOTE — Assessment & Plan Note (Signed)
GAD-7 indicated anxiety, patient has a history of anxiety as well.  Discussed with patient that as needed medications are not good long-term, especially those that are habit-forming including Klonopin and Xanax.  Also advised that with patient's history of cardiac pauses and other comorbidities, would not recommend benzodiazepine.  Discussed options for anxiety as well as pain including venlafaxine and duloxetine.  Patient thinks that she was previously on Cymbalta and did not tolerate this well, therefore will start at Effexor XR 37.5 mg daily.  Follow-up in 1 month.  Also advised patient that this may help with her generalized pain complaints.

## 2020-03-20 NOTE — Patient Instructions (Signed)
Thank you for coming to see me today. It was a pleasure. Today we talked about:   I have sent medication for your anxiety and Linzess to [place].  I have [facility] to [facility] for [facility].  If you do not hear from them in the next 2 weeks, please give [place] a call.  [facility] password is: [email or web address]  Please follow-up with me in 1 month.  If you have any questions or concerns, please do not hesitate to call the office at [phone].  Best,   [place] Constable, DO

## 2020-03-20 NOTE — Progress Notes (Signed)
  Echocardiogram 2D Echocardiogram has been performed.  Cardell Peach 03/20/2020, 4:35 PM

## 2020-03-20 NOTE — Assessment & Plan Note (Signed)
Patient complained of pain to her provider at cardiology.  Is scheduled for DVT ultrasound today.  Can look into this further if DVT ultrasound is negative.

## 2020-03-20 NOTE — Assessment & Plan Note (Signed)
Discussed with patient that any surgery for sleep apnea would be specific to what kind of sleep apnea she has.  Advised patient that would need to start with a formal sleep study for a true diagnosis of sleep apnea.  She agrees to this.  Order placed.

## 2020-03-20 NOTE — Progress Notes (Signed)
SUBJECTIVE:   CHIEF COMPLAINT / HPI:    Anxiety Patient complaining of increased anxiety in the last few months.  States that she is noticed that she is becoming more irritated than usual with people and that she has problems feeling very anxious and nervous.  Reports that she has been on Klonopin and Xanax in the past and is wondering if she can have a prescription for Xanax.  States that she has been on Cymbalta previously and did not tolerate this medication well as it does not make you feel well.  Overall, denies depressive symptoms, no SI.   GAD 7 : Generalized Anxiety Score 03/20/2020  Nervous, Anxious, on Edge 3  Control/stop worrying 2  Worry too much - different things 1  Trouble relaxing 1  Restless 3  Easily annoyed or irritable 2  Afraid - awful might happen 0  Total GAD 7 Score 12  Anxiety Difficulty Somewhat difficult   IBS with constipation Patient is requesting a refill on Linzess which she is to receive from GI, she is asked for refills, but has not yet received them.  States that her bowel movements are well controlled with this.    Snoring Reports a history of snoring, partner reports that she stops breathing at night.  Has not been able to tolerate a CPAP due to being claustrophobic.  Feels tired during the day even when she gets 8 hrs of sleep.  She is inquiring about surgery to help with OSA.  Right Leg Swelling and Pain If she is up more than 5-7 minutes, her feet and her legs hurt with cramping in her calf.  Discussed this with cardiologist and has an [place] today to rule out DVT.  She also reports that she overall feels that she has pain and cramping in her legs.  Her labs were within normal limits last week.  PERTINENT  PMH / PSH: History of Hollenhorst plaque OS, hypertension, COPD, CKD, HLD, restless leg, anxiety and depression  OBJECTIVE:   BP 126/80   Pulse (!) 102   Ht 5\' 1"  (1.549 m)   Wt 136 lb 3.2 oz (61.8 kg)   SpO2 99%   BMI 25.73 kg/m     Physical Exam:  General: 64 y.o. female in NAD Cardio: RRR no m/r/g Lungs: CTAB, no wheezing, no rhonchi, no crackles, no IWOB on RA Skin: warm and dry Extremities: No edema   ASSESSMENT/PLAN:   Anxiety GAD-7 indicated anxiety, patient has a history of anxiety as well.  Discussed with patient that as needed medications are not good long-term, especially those that are habit-forming including Klonopin and Xanax.  Also advised that with patient's history of cardiac pauses and other comorbidities, would not recommend benzodiazepine.  Discussed options for anxiety as well as pain including venlafaxine and duloxetine.  Patient thinks that she was previously on Cymbalta and did not tolerate this well, therefore will start at Effexor XR 37.5 mg daily.  Follow-up in 1 month.  Also advised patient that this may help with her generalized pain complaints.  Irritable bowel syndrome (IBS) Previously seen by GI.  Has been well controlled on Linzess.  Refill sent.  Sleep apnea-like behavior Discussed with patient that any surgery for sleep apnea would be specific to what kind of sleep apnea she has.  Advised patient that would need to start with a formal sleep study for a true diagnosis of sleep apnea.  She agrees to this.  Order placed.  Right leg pain Patient  complained of pain to her provider at cardiology.  Is scheduled for DVT ultrasound today.  Can look into this further if DVT ultrasound is negative.     Cleophas Dunker, [place]

## 2020-03-21 ENCOUNTER — Telehealth: Payer: Self-pay

## 2020-03-21 NOTE — Telephone Encounter (Signed)
Spoke with patient regarding results.  Patient verbalizes understanding and is agreeable to plan of care. Advised patient to call back with any issues or concerns.  

## 2020-03-21 NOTE — Telephone Encounter (Signed)
-----   Message from Berniece Salines, DO sent at 03/21/2020  1:36 PM EDT ----- Normal DVT study please notify patient.

## 2020-03-22 ENCOUNTER — Encounter: Payer: Self-pay | Admitting: Family Medicine

## 2020-03-23 ENCOUNTER — Encounter: Payer: Self-pay | Admitting: Family Medicine

## 2020-03-27 DIAGNOSIS — I499 Cardiac arrhythmia, unspecified: Secondary | ICD-10-CM | POA: Diagnosis not present

## 2020-03-27 DIAGNOSIS — K219 Gastro-esophageal reflux disease without esophagitis: Secondary | ICD-10-CM | POA: Diagnosis not present

## 2020-03-27 DIAGNOSIS — J449 Chronic obstructive pulmonary disease, unspecified: Secondary | ICD-10-CM | POA: Diagnosis not present

## 2020-03-27 DIAGNOSIS — H35342 Macular cyst, hole, or pseudohole, left eye: Secondary | ICD-10-CM | POA: Diagnosis not present

## 2020-03-27 DIAGNOSIS — M1990 Unspecified osteoarthritis, unspecified site: Secondary | ICD-10-CM | POA: Diagnosis not present

## 2020-03-27 DIAGNOSIS — Z87442 Personal history of urinary calculi: Secondary | ICD-10-CM | POA: Diagnosis not present

## 2020-03-27 DIAGNOSIS — G4733 Obstructive sleep apnea (adult) (pediatric): Secondary | ICD-10-CM | POA: Diagnosis not present

## 2020-03-27 DIAGNOSIS — K449 Diaphragmatic hernia without obstruction or gangrene: Secondary | ICD-10-CM | POA: Diagnosis not present

## 2020-03-29 ENCOUNTER — Telehealth (INDEPENDENT_AMBULATORY_CARE_PROVIDER_SITE_OTHER): Payer: Medicaid Other | Admitting: Family Medicine

## 2020-03-29 ENCOUNTER — Other Ambulatory Visit: Payer: Self-pay

## 2020-03-29 DIAGNOSIS — G43519 Persistent migraine aura without cerebral infarction, intractable, without status migrainosus: Secondary | ICD-10-CM | POA: Diagnosis not present

## 2020-03-29 MED ORDER — SUMATRIPTAN SUCCINATE 50 MG PO TABS: 50.0000 mg | ORAL_TABLET | ORAL | 0 refills | Status: DC | PRN

## 2020-03-29 NOTE — Assessment & Plan Note (Signed)
Previous history of migraines.  Frequency is increasing, character is otherwise unchanged.  His seen ophthalmology about this, they recommend restarting migraine medication.  Will order Imitrex to use as needed, patient has done well with this previously.  Follow-up as needed if no improvements.

## 2020-03-29 NOTE — Progress Notes (Signed)
[place] Telemedicine Visit  Patient consented to have virtual visit and was identified by name and date of birth. 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  Respiratory: Speaking in complete sentences, no evidence of respiratory distress over video  Assessment/Plan:  Migraine aura, persistent, intractable Previous history of migraines.  Frequency is increasing, character is otherwise unchanged.  His seen ophthalmology about this, they recommend restarting migraine medication.  Will order Imitrex to use as needed, patient has done well with this previously.  Follow-up as needed if no improvements.    Time spent during visit with patient: 8 minutes

## 2020-04-11 ENCOUNTER — Ambulatory Visit: Payer: Medicaid Other | Admitting: Cardiology

## 2020-04-18 ENCOUNTER — Other Ambulatory Visit: Payer: Self-pay | Admitting: Family Medicine

## 2020-04-18 DIAGNOSIS — F419 Anxiety disorder, unspecified: Secondary | ICD-10-CM

## 2020-04-20 ENCOUNTER — Encounter: Payer: Self-pay | Admitting: Family Medicine

## 2020-04-20 ENCOUNTER — Other Ambulatory Visit: Payer: Self-pay

## 2020-04-20 ENCOUNTER — Ambulatory Visit (INDEPENDENT_AMBULATORY_CARE_PROVIDER_SITE_OTHER): Payer: Medicaid Other | Admitting: Family Medicine

## 2020-04-20 ENCOUNTER — Telehealth: Payer: Self-pay

## 2020-04-20 VITALS — BP 118/84 | HR 83 | Ht 61.0 in | Wt 137.0 lb

## 2020-04-20 DIAGNOSIS — E785 Hyperlipidemia, unspecified: Secondary | ICD-10-CM

## 2020-04-20 DIAGNOSIS — Z1231 Encounter for screening mammogram for malignant neoplasm of breast: Secondary | ICD-10-CM | POA: Diagnosis not present

## 2020-04-20 DIAGNOSIS — G43519 Persistent migraine aura without cerebral infarction, intractable, without status migrainosus: Secondary | ICD-10-CM | POA: Diagnosis not present

## 2020-04-20 DIAGNOSIS — F419 Anxiety disorder, unspecified: Secondary | ICD-10-CM | POA: Diagnosis not present

## 2020-04-20 MED ORDER — SUMATRIPTAN SUCCINATE 50 MG PO TABS: 50.0000 mg | ORAL_TABLET | ORAL | 3 refills | Status: DC | PRN

## 2020-04-20 NOTE — Assessment & Plan Note (Signed)
Patient has had significant improvements in her headaches with Imitrex.  She is asking for refill today.  She has gone through 10 tablets in the last month.  I have refilled this for 3 months.  She reports that her ophthalmologist thinks that her headaches will improve in the next 3 months as her vision improves.

## 2020-04-20 NOTE — Assessment & Plan Note (Signed)
Patient has an order for mammogram in the system.  She was advised that she should call to schedule this.  She states that she will do so.

## 2020-04-20 NOTE — Assessment & Plan Note (Signed)
Improving on Effexor 37.5 mg.  We will continue with this.  GAD-7 from 12 to 1.  Patient is very happy with the way that she is feeling and the progress that has been made.  Advised her that there is still room to go up if needed, she states this is not needed at this time.  We will continue to monitor.

## 2020-04-20 NOTE — Progress Notes (Signed)
    SUBJECTIVE:   CHIEF COMPLAINT / HPI:   Anxiety f/u Patient seen on 4/20 for anxiety Prescribed Effexor doing very well  GAD 7 : Generalized Anxiety Score 04/20/2020 03/20/2020  Nervous, Anxious, on Edge 0 3  Control/stop worrying 0 2  Worry too much - different things 0 1  Trouble relaxing 1 1  Restless 0 3  Easily annoyed or irritable 0 2  Afraid - awful might happen 0 0  Total GAD 7 Score 1 12  Anxiety Difficulty Not difficult at all Somewhat difficult    Migraine Headaches Patient has a history of migraine headaches that were worsening after changes in vision with macular hole OS She was started on imitrex to use prn States that imitrex is helping  Need for Mammogram Ordered in November Hasn't scheduled it yet  HLD Has been on Livalo, but there was confusion at the pharmacy and she was only getting 1 mg, also on Zetia Planning on PSK-9 inhibitor after eye improves Next appointment with Dr. Harriet Masson 7/15   PERTINENT  PMH / PSH: Anxiety, history of Hollenhorst plaque OS, hypertension, COPD, CKD, hyperlipidemia, restless leg  OBJECTIVE:   BP 118/84   Pulse 83   Ht 5\' 1"  (1.549 m)   Wt 137 lb (62.1 kg)   SpO2 97%   BMI 25.89 kg/m    Physical Exam:  General: 64 y.o. female in NAD Cardio: RRR no m/r/g Lungs: CTAB, no wheezing, no rhonchi, no crackles, no IWOB on RA Skin: warm and dry Extremities: Ambulating without difficulty   ASSESSMENT/PLAN:   Anxiety Improving on Effexor 37.5 mg.  We will continue with this.  GAD-7 from 12 to 1.  Patient is very happy with the way that she is feeling and the progress that has been made.  Advised her that there is still room to go up if needed, she states this is not needed at this time.  We will continue to monitor.  Migraine aura, persistent, intractable Patient has had significant improvements in her headaches with Imitrex.  She is asking for refill today.  She has gone through 10 tablets in the last month.  I have  refilled this for 3 months.  She reports that her ophthalmologist thinks that her headaches will improve in the next 3 months as her vision improves.  Encounter for screening mammogram for malignant neoplasm of breast Patient has an order for mammogram in the system.  She was advised that she should call to schedule this.  She states that she will do so.  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  She is managing this with cardiology and they are planning on starting a PSK 9 inhibitor in the future.  For now, she is on will follow 4 mg and Zetia 10 mg.  Continue with this.   Return in 3 months or sooner as needed.  Cleophas Dunker, [place]

## 2020-04-20 NOTE — Telephone Encounter (Signed)
Prior approval for Livalo 4mg  completed via [facility] [employer]. Med approved. Prior approval [id number]. [place] informed.

## 2020-04-20 NOTE — Assessment & Plan Note (Signed)
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  She is managing this with cardiology and they are planning on starting a PSK 9 inhibitor in the future.  For now, she is on will follow 4 mg and Zetia 10 mg.  Continue with this.

## 2020-04-20 NOTE — Patient Instructions (Signed)
Thank you for coming to see me today. It was a pleasure. Today we talked about:   I'm glad you're doing so well!  Bring your medications to your next visit.  Please follow-up with me in 3 months or sooner as needed.  If you have any questions or concerns, please do not hesitate to call the office at [phone].  Best,   [place] Constable, DO

## 2020-04-23 ENCOUNTER — Other Ambulatory Visit: Payer: Self-pay | Admitting: *Deleted

## 2020-04-25 ENCOUNTER — Other Ambulatory Visit (HOSPITAL_COMMUNITY)
Admission: RE | Admit: 2020-04-25 | Discharge: 2020-04-25 | Disposition: A | Discharge status: Home / Self Care | Payer: Medicaid Other | Source: Ambulatory Visit | Attending: Internal Medicine | Admitting: Internal Medicine

## 2020-04-25 DIAGNOSIS — Z01812 Encounter for preprocedural laboratory examination: Secondary | ICD-10-CM | POA: Insufficient documentation

## 2020-04-25 DIAGNOSIS — Z20822 Contact with and (suspected) exposure to covid-19: Secondary | ICD-10-CM | POA: Diagnosis not present

## 2020-04-25 LAB — SARS CORONAVIRUS 2 (TAT 6-24 HRS): SARS Coronavirus 2: NEGATIVE

## 2020-04-26 MED ORDER — AMLODIPINE BESYLATE 5 MG PO TABS: 5.0000 mg | ORAL_TABLET | Freq: Every day | ORAL | 3 refills | Status: DC

## 2020-04-27 ENCOUNTER — Other Ambulatory Visit: Payer: Self-pay

## 2020-04-27 ENCOUNTER — Ambulatory Visit (HOSPITAL_BASED_OUTPATIENT_CLINIC_OR_DEPARTMENT_OTHER): Payer: Medicaid Other | Attending: Family Medicine | Admitting: Internal Medicine

## 2020-04-27 VITALS — Ht 60.0 in | Wt 136.0 lb

## 2020-04-27 DIAGNOSIS — G478 Other sleep disorders: Secondary | ICD-10-CM | POA: Diagnosis not present

## 2020-04-27 DIAGNOSIS — R0683 Snoring: Secondary | ICD-10-CM

## 2020-05-01 ENCOUNTER — Other Ambulatory Visit (HOSPITAL_BASED_OUTPATIENT_CLINIC_OR_DEPARTMENT_OTHER): Payer: Self-pay

## 2020-05-01 DIAGNOSIS — G4739 Other sleep apnea: Secondary | ICD-10-CM

## 2020-05-05 DIAGNOSIS — R0683 Snoring: Secondary | ICD-10-CM

## 2020-05-05 NOTE — Procedures (Signed)
    Patient Name: Diana [name], Fake Date: 04/27/2020 Gender: Female D.O.B: [birth year] Age (years): 42 Referring Provider: Blane Ohara McDiarmid Height (inches): 60 Interpreting Physician: Baird Lyons MD, ABSM Weight (lbs): 136 RPSGT: Baxter Flattery BMI: 27 MRN: [id number] Neck Size: 15.00  CLINICAL INFORMATION Sleep Study Type: NPSG Indication for sleep study: Snoring, Witnesses Apnea / Gasping During Sleep Epworth Sleepiness Score: 8  SLEEP STUDY TECHNIQUE As per the AASM Manual for the Scoring of Sleep and Associated Events v2.3 (April 2016) with a hypopnea requiring 4% desaturations.  The channels recorded and monitored were frontal, central and occipital EEG, electrooculogram (EOG), submentalis EMG (chin), nasal and oral airflow, thoracic and abdominal wall motion, anterior tibialis EMG, snore microphone, electrocardiogram, and pulse oximetry.  MEDICATIONS Medications self-administered by patient taken the night of the study : none reported  SLEEP ARCHITECTURE The study was initiated at 10:38:39 PM and ended at 4:57:26 AM.  Sleep onset time was 6.6 minutes and the sleep efficiency was 93.4%%. The total sleep time was 353.7 minutes.  Stage REM latency was 264.5 minutes.  The patient spent 4.0%% of the night in stage N1 sleep, 70.9%% in stage N2 sleep, 11.2%% in stage N3 and 14% in REM.  Alpha intrusion was absent.  Supine sleep was 4.44%.  RESPIRATORY PARAMETERS The overall apnea/hypopnea index (AHI) was 0.7 per hour. There were 1 total apneas, including 1 obstructive, 0 central and 0 mixed apneas. There were 3 hypopneas and 1 RERAs.  The AHI during Stage REM sleep was 1.2 per hour.  AHI while supine was 3.8 per hour.  The mean oxygen saturation was 91.3%. The minimum SpO2 during sleep was 86.0%.  loud snoring was noted during this study.  CARDIAC DATA The 2 lead EKG demonstrated sinus rhythm, atrial fibrillation. The mean heart rate was 77.3 beats per minute. Other  EKG findings include: PVCs.  LEG MOVEMENT DATA The total PLMS were 0 with a resulting PLMS index of 0.0. Associated arousal with leg movement index was 0.2 .  IMPRESSIONS - No significant obstructive sleep apnea occurred during this study (AHI = 0.7/h). - No significant central sleep apnea occurred during this study (CAI = 0.0/h). - Mild oxygen desaturation was noted during this study (Min O2 = 86.0%). Mean sat 91.3%. - The patient snored with loud snoring volume. - EKG findings include PVCs. - Total Limb Movewments 129. Limb Movements with Arousal 1 ( 0.2/ hr).  DIAGNOSIS - Primary Snoring  RECOMMENDATIONS - Manage snoring and symptoms based on clinical judgment. Although limb movements were not linked to arousal events during this study, consider if a therapeutic trial of Requip or Miralex would be appropriate. . - Sleep hygiene should be reviewed to assess factors that may improve sleep quality. - Weight management and regular exercise should be initiated or continued if appropriate.  [Electronically signed] 05/05/2020 11:40 AM  Baird Lyons MD, ABSM Diplomate, American Board of Sleep Medicine   NPI: [id number]                         [place], [place] [facility]  ELECTRONICALLY SIGNED ON:  05/05/2020, 11:34 AM [place] PH: [phone]   FX: [phone] [place]

## 2020-05-24 ENCOUNTER — Other Ambulatory Visit: Payer: Self-pay

## 2020-05-24 DIAGNOSIS — M109 Gout, unspecified: Secondary | ICD-10-CM

## 2020-05-24 MED ORDER — AMLODIPINE BESYLATE 5 MG PO TABS: 5.0000 mg | ORAL_TABLET | Freq: Every day | ORAL | 3 refills | Status: DC

## 2020-05-24 MED ORDER — ESOMEPRAZOLE MAGNESIUM 40 MG PO CPDR: 40.0000 mg | DELAYED_RELEASE_CAPSULE | Freq: Every day | ORAL | 1 refills | Status: DC

## 2020-05-24 MED ORDER — ALLOPURINOL 100 MG PO TABS: 100.0000 mg | ORAL_TABLET | Freq: Every day | ORAL | 6 refills | Status: DC

## 2020-06-14 ENCOUNTER — Other Ambulatory Visit: Payer: Self-pay

## 2020-06-14 ENCOUNTER — Ambulatory Visit (INDEPENDENT_AMBULATORY_CARE_PROVIDER_SITE_OTHER): Payer: Medicaid Other | Admitting: Cardiology

## 2020-06-14 ENCOUNTER — Encounter: Payer: Self-pay | Admitting: Cardiology

## 2020-06-14 VITALS — BP 142/78 | HR 80 | Ht 60.0 in | Wt 141.4 lb

## 2020-06-14 DIAGNOSIS — I10 Essential (primary) hypertension: Secondary | ICD-10-CM | POA: Diagnosis not present

## 2020-06-14 DIAGNOSIS — E7801 Familial hypercholesterolemia: Secondary | ICD-10-CM | POA: Diagnosis not present

## 2020-06-14 DIAGNOSIS — I441 Atrioventricular block, second degree: Secondary | ICD-10-CM | POA: Diagnosis not present

## 2020-06-14 NOTE — Progress Notes (Signed)
Cardiology Office Note:    Date:  06/14/2020   ID:  Diana [name], DOB [birth year], MRN [id number]  PCP:  Cleophas Dunker, DO  Cardiologist:  Berniece Salines, DO  Electrophysiologist:  None   Referring MD: Cleophas Dunker, *   " I am doing fine"  History of Present Illness:    Diana [name] a 64 y.o.femalewith a hx of Hollenhorst plaque,hyperlipidemia, hypercholesterolemia, hypertension and tobacco abuse. I saw the patient on 02/10/2020 at that time we discussed her monitor result, and due to the evidence of second degree AV block, I stopped her beta-blocker and referred her to EP.  In the meantime we held her surgery once she was evaluated by EP.    Due to improvement of beta-blocker there is no indication for pacemaker.  Her blood work shows significant elevated LDL she was started on Livalo and Zetia.   I saw the patient March 20, 2020 at that time she was experiencing some leg swelling we therefore did a ultrasound duplex to rule out DVT.  We did discuss that we were going to repeat her lipid profile and then send the patient for PCSK9 inhibitors.  Today she is here for follow-up visit.  She does not offer any complaints at this time.   Past Medical History:  Diagnosis Date  . Anxiety   . Asthma   . Barrett esophagus    "don't know that I've ever been stretched" (08/17/2013)  . Choledocholithiasis with acute cholecystitis   . Chronic lower back pain   . COPD (chronic obstructive pulmonary disease) ([place])   . Craniosynostosis    congenital  . DDD (degenerative disc disease)   . Family history of anesthesia complication    "PONV; both parents" (08/17/2013)  . GERD (gastroesophageal reflux disease)   . H/O hiatal hernia   . History of blood transfusion 1959-1960  . Hollenhorst plaque    Left eye  . Hypercholesterolemia 05/28/2012   Has taken Crestor and Lipitor in the past. Switched to lovastatin b/c its $4.    . Hypertension   . IBS (irritable bowel syndrome)     . Migraines    "since age 4; probably twice/month" (08/17/2013)  . PONV (postoperative nausea and vomiting)   . Rheumatoid arthritis ([place])    "in my knuckles" (08/17/2013)  . Ruptured lumbar disc 1990's   L4-L5  . Shingles     Past Surgical History:  Procedure Laterality Date  . ABDOMINAL HYSTERECTOMY  1992   Total for chronic pelvic pain   . APPENDECTOMY    . BACK SURGERY  1990's   L4-L5 fusion @ [facility]  05/28/2019   fused C4-5-6, done in [place]  . CHOLECYSTECTOMY  2007   Performed in [place]   . COLONOSCOPY    . CRANIECTOMY FOR CRANIOSYNOSTOSIS  [id number]   "1st time got infected; 2nd time didn't take; fix the 3rd time" (08/17/2013)  . POSTERIOR LUMBAR FUSION  1990's   "took piece off my hip" (08/17/2013)  . TOTAL HIP ARTHROPLASTY Right 12/31/04   AVN after fall injury; performed by Ralene Cork; DePuy S-ROM total hip (metal on metal)  . UPPER GASTROINTESTINAL ENDOSCOPY      Current Medications: Current Meds  Medication Sig  . albuterol (PROVENTIL HFA;VENTOLIN HFA) 108 (90 Base) MCG/ACT inhaler Inhale 2 puffs into the lungs every 6 (six) hours as needed for wheezing.  Marland Kitchen allopurinol (ZYLOPRIM) 100 MG tablet Take 1 tablet (100 mg total)  by mouth daily.  Marland Kitchen amLODipine (NORVASC) 5 MG tablet Take 1 tablet (5 mg total) by mouth daily.  . calcium-vitamin D (OSCAL WITH D) 500-200 MG-UNIT TABS tablet Take by mouth.  . Colchicine 0.6 MG CAPS Take 1 capsule by mouth daily.  . cycloSPORINE (RESTASIS) 0.05 % ophthalmic emulsion Place 1 drop into both eyes 2 (two) times daily.  . diclofenac (VOLTAREN) 75 MG EC tablet TAKE 1 TABLET BY MOUTH TWICE DAILY AFTER MEALS  . diclofenac sodium (VOLTAREN) 1 % GEL Apply 2 g topically 4 (four) times daily as needed.  . dicyclomine (BENTYL) 10 MG capsule Take 1 capsule (10 mg total) by mouth 2 (two) times daily as needed for spasms.  . DUREZOL 0.05 % EMUL   . esomeprazole (NEXIUM) 40 MG capsule Take 1  capsule (40 mg total) by mouth daily at 12 noon.  Marland Kitchen HYDROcodone-acetaminophen (NORCO/VICODIN) 5-325 MG tablet Take 1 tablet by mouth every 4 (four) hours as needed.  Marland Kitchen ibuprofen (ADVIL) 800 MG tablet TAKE 1 TABLET BY MOUTH EVERY 8 HOURS WITH FOOD  . linaclotide (LINZESS) 145 MCG CAPS capsule Take 1 capsule (145 mcg total) by mouth daily before breakfast.  . Pitavastatin Calcium (LIVALO) 4 MG TABS Take 1 tablet (4 mg total) by mouth at bedtime.  . SUMAtriptan (IMITREX) 50 MG tablet Take 1 tablet (50 mg total) by mouth every 2 (two) hours as needed for migraine. May repeat in 2 hours if headache persists or recurs.  . timolol (TIMOPTIC) 0.5 % ophthalmic solution   . Tiotropium Bromide-Olodaterol (STIOLTO RESPIMAT) 2.5-2.5 MCG/ACT AERS Inhale 2 puffs into the lungs daily.  Marland Kitchen venlafaxine XR (EFFEXOR-XR) 37.5 MG 24 hr capsule TAKE 1 CAPSULE BY MOUTH ONCE DAILY WITH  BREAKFAST  . ZIOPTAN 0.0015 % SOLN      Allergies:   Penicillins, Demerol [meperidine], Doxycycline hyclate, Gabapentin, Sulfa antibiotics, Levaquin [levofloxacin], Benadryl [diphenhydramine hcl], Buprenorphine hcl, Cephalexin, Morphine and related, and Spiriva handihaler [tiotropium bromide monohydrate]   Social History   Socioeconomic History  . Marital status: Divorced    Spouse name: Not on file  . Number of children: 2  . Years of education: Not on file  . Highest education level: Not on file  Occupational History  . Occupation: retired  Tobacco Use  . Smoking status: Current Every Day Smoker    Packs/day: 0.50    Years: 32.00    Pack years: 16.00    Types: Cigarettes  . Smokeless tobacco: Never Used  . Tobacco comment: on chanix  Vaping Use  . Vaping Use: Never used  Substance and Sexual Activity  . Alcohol use: Yes    Alcohol/week: 0.0 standard drinks    Comment: occasionally  . Drug use: No  . Sexual activity: Not Currently  Other Topics Concern  . Not on file  Social History Narrative  . Not on file    Social Determinants of Health   Financial Resource Strain:   . Difficulty of Paying Living Expenses:   Food Insecurity:   . Worried About Charity fundraiser in the Last Year:   . Arboriculturist in the Last Year:   Transportation Needs:   . Film/video editor (Medical):   Marland Kitchen Lack of Transportation (Non-Medical):   Physical Activity:   . Days of Exercise per Week:   . Minutes of Exercise per Session:   Stress:   . Feeling of Stress :   Social Connections:   . Frequency of Communication with Friends and  Family:   . Frequency of Social Gatherings with Friends and Family:   . Attends Religious Services:   . Active Member of Clubs or Organizations:   . Attends Archivist Meetings:   Marland Kitchen Marital Status:      Family History: The patient's family history includes Colon cancer in her paternal grandfather; Hypertension in her father and mother; Skin cancer in her father. There is no history of Rectal cancer, Stomach cancer, or Esophageal cancer.  ROS:   Review of Systems  Constitution: Negative for decreased appetite, fever and weight gain.  HENT: Negative for congestion, ear discharge, hoarse voice and sore throat.   Eyes: Negative for discharge, redness, vision loss in right eye and visual halos.  Cardiovascular: Negative for chest pain, dyspnea on exertion, leg swelling, orthopnea and palpitations.  Respiratory: Negative for cough, hemoptysis, shortness of breath and snoring.   Endocrine: Negative for heat intolerance and polyphagia.  Hematologic/Lymphatic: Negative for bleeding problem. Does not bruise/bleed easily.  Skin: Negative for flushing, nail changes, rash and suspicious lesions.  Musculoskeletal: Negative for arthritis, joint pain, muscle cramps, myalgias, neck pain and stiffness.  Gastrointestinal: Negative for abdominal pain, bowel incontinence, diarrhea and excessive appetite.  Genitourinary: Negative for decreased libido, genital sores and incomplete  emptying.  Neurological: Negative for brief paralysis, focal weakness, headaches and loss of balance.  Psychiatric/Behavioral: Negative for altered mental status, depression and suicidal ideas.  Allergic/Immunologic: Negative for HIV exposure and persistent infections.    EKGs/Labs/Other Studies Reviewed:    The following studies were reviewed today:   EKG: None today  US carotid Summary:  Right Carotid: Velocities in the right ICA are consistent with a 60-79% stenosis. The ECA appears >50% stenosed. Low end 60% stenosis.  Left Carotid: Velocities in the left ICA are consistent with a 60-79%  stenosis. The ECA appears >50% stenosed. Vertebrals: Bilateral vertebral arteries demonstrate antegrade flow.  Subclavians: Normal flow hemodynamics were seen in bilateral subclavian  arteries.   TTE IMPRESSIONS 01/18/20 1. Left ventricular ejection fraction, by estimation, is 55 to 60%. The left ventricle has normal function. The left ventricle has no regional wall motion abnormalities. Left ventricular diastolic parameters are  consistent with Grade I diastolic dysfunction (impaired relaxation).  2. Right ventricular systolic function is normal. The right ventricular size is normal.  3. The mitral valve is normal in structure and function. No evidence of  mitral valve regurgitation. No evidence of mitral stenosis.  4. The aortic valve is normal in structure and function. Aortic valve regurgitation is not visualized. No aortic stenosis is present.  5. There is no obvious atheromatous plaque identified in the visible portions of the ascending aorta or aortic arch.  6. The inferior vena cava is normal in size with greater than 50% respiratory variability, suggesting right atrial pressure of 3 mmHg.   Zio monitor  The patient wore the monitor for 6 days 20 hours starting 01/17/2020. Indication: Dizziness The minimum heart rate was 29 bpm, maximum heart rate was 150 bpm, and average heart  rate was 77 bpm. Predominant underlying rhythm was Sinus Rhythm. First Degree AV Block was present. Bundle Branch Block/IVCD was present.  6 Supraventricular Tachycardia runs occurred, the run with the fastest interval lasting 5 beats with a maximum rate of 150 bpm, the longest lasting 10 beats with an average rate of 94 bpm.  1 episode of AV Block (2nd) occurred, lasting a total of 3 seconds. Premature atrial complexes were rare (less than 1%). Premature Ventricular complexes  were rare (less than 1%). No ventricular tachycardia, No third-degree AV block and no atrial fibrillation present. 4 patient triggered events noted or associated with premature atrial complexes. Conclusion: This study is remarkable for the following. 1. Second-degree (2-1) AV block, lasting 3 seconds. 2. Asymptomatic paroxysmal supraventricular tachycardia. 3. Rare symptomatic premature atrial complexes.    Recent Labs: 11/15/2019: Platelets 227 01/13/2020: ALT 15; Hemoglobin 12.8 01/17/2020: TSH 2.810 03/16/2020: BUN 8; Creatinine, Ser 0.91; Magnesium 2.1; Potassium 3.7; Sodium 133  Recent Lipid Panel    Component Value Date/Time   CHOL 252 (H) 01/13/2020 1012   TRIG 150 (H) 01/13/2020 1012   HDL 60 01/13/2020 1012   CHOLHDL 4.2 01/13/2020 1012   CHOLHDL 5.7 04/20/2015 1138   VLDL 34 04/20/2015 1138   LDLCALC 165 (H) 01/13/2020 1012   LDLDIRECT 246 (H) 10/31/2015 1444    Physical Exam:    VS:  BP (!) 142/78   Pulse 80   Ht 5' (1.524 m)   Wt 141 lb 6.4 oz (64.1 kg)   SpO2 96%   BMI 27.62 kg/m     Wt Readings from Last 3 Encounters:  06/14/20 141 lb 6.4 oz (64.1 kg)  04/27/20 136 lb (61.7 kg)  04/20/20 137 lb (62.1 kg)     GEN: Well nourished, well developed in no acute distress HEENT: Normal NECK: No JVD; No carotid bruits LYMPHATICS: No lymphadenopathy CARDIAC: S1S2 noted,RRR, no murmurs, rubs,  gallops RESPIRATORY:  Clear to auscultation without rales, wheezing or rhonchi  ABDOMEN: Soft, non-tender, non-distended, +bowel sounds, no guarding. EXTREMITIES: No edema, No cyanosis, no clubbing MUSCULOSKELETAL:  No deformity  SKIN: Warm and dry NEUROLOGIC:  Alert and oriented x 3, non-focal PSYCHIATRIC:  Normal affect, good insight  ASSESSMENT:    1. Familial hypercholesterolemia   2. Second degree AV block   3. Hypertension, essential, benign    PLAN:     Her blood pressure slightly elevated in the office today.  She tells me at home she is running with systolics in the 026V.  I am going to have the patient take her blood pressure daily and if this continues goes up to notify my office. In the meantime she needs a repeat lipid profile with a LP(a).  She will take this blood work fasting.  She plans to do this in the next 2 weeks.  If this does not improve I will refer her to the lipid clinic to start the process for PCSK9 inhibitors.  The patient is in agreement with the above plan. The patient left the office in stable condition.  The patient will follow up in 1 year or sooner if needed.   Medication Adjustments/Labs and Tests Ordered: Current medicines are reviewed at length with the patient today.  Concerns regarding medicines are outlined above.  Orders Placed This Encounter  Procedures  . Lipoprotein A (LPA)  . Lipid panel   No orders of the defined types were placed in this encounter.   Patient Instructions  Medication Instructions:  No medication changes. *If you need a refill on your cardiac medications before your next appointment, please call your pharmacy*   Lab Work: Your physician recommends that you return for lab work in: tomorrow. You need to have labs done when you are fasting.  You can come Monday through Friday 8:30 am to 12:00 pm and 1:15 to 4:30. You do not need to make an appointment as the order has already been placed.    If you have labs  (  blood work) drawn today and your tests are completely normal, you will receive your results only by: Marland Kitchen MyChart Message (if you have MyChart) OR . A paper copy in the mail If you have any lab test that is abnormal or we need to change your treatment, we will call you to review the results.   Testing/Procedures: None ordered   Follow-Up: At [facility], you and your health needs are our priority.  As part of our continuing mission to provide you with exceptional heart care, we have created designated Provider Care Teams.  These Care Teams include your primary Cardiologist (physician) and Advanced Practice Providers (APPs -  Physician Assistants and Nurse Practitioners) who all work together to provide you with the care you need, when you need it.  We recommend signing up for the patient portal called "MyChart".  Sign up information is provided on this After Visit Summary.  MyChart is used to connect with patients for Virtual Visits (Telemedicine).  Patients are able to view lab/test results, encounter notes, upcoming appointments, etc.  Non-urgent messages can be sent to your provider as well.   To learn more about what you can do with MyChart, go to [email or web address].    Your next appointment:   1 year(s)  The format for your next appointment:   In Person  Provider:   Berniece Salines, DO   Other Instructions NA     Adopting a Healthy Lifestyle.  Know what a healthy weight is for you (roughly BMI <25) and aim to maintain this   Aim for 7+ servings of fruits and vegetables daily   65-80+ fluid ounces of water or unsweet tea for healthy kidneys   Limit to max 1 drink of alcohol per day; avoid smoking/tobacco   Limit animal fats in diet for cholesterol and heart health - choose grass fed whenever available   Avoid highly processed foods, and foods high in saturated/trans fats   Aim for low stress - take time to unwind and care for your mental health   Aim for 150 min  of moderate intensity exercise weekly for heart health, and weights twice weekly for bone health   Aim for 7-9 hours of sleep daily   When it comes to diets, agreement about the perfect plan isnt easy to find, even among the experts. Experts at the [employer] developed an idea known as the Healthy Eating Plate. Just imagine a plate divided into logical, healthy portions.   The emphasis is on diet quality:   Load up on vegetables and fruits - one-half of your plate: Aim for color and variety, and remember that potatoes dont count.   Go for whole grains - one-quarter of your plate: Whole wheat, barley, wheat berries, quinoa, oats, brown rice, and foods made with them. If you want pasta, go with whole wheat pasta.   Protein power - one-quarter of your plate: Fish, chicken, beans, and nuts are all healthy, versatile protein sources. Limit red meat.   The diet, however, does go beyond the plate, offering a few other suggestions.   Use healthy plant oils, such as olive, canola, soy, corn, sunflower and peanut. Check the labels, and avoid partially hydrogenated oil, which have unhealthy trans fats.   If youre thirsty, drink water. Coffee and tea are good in moderation, but skip sugary drinks and limit milk and dairy products to one or two daily servings.   The type of carbohydrate in the diet is more important than  the amount. Some sources of carbohydrates, such as vegetables, fruits, whole grains, and beans-are healthier than others.   Finally, stay active  Signed, Berniece Salines, DO  06/14/2020 4:43 PM    [facility]

## 2020-06-14 NOTE — Patient Instructions (Signed)
Medication Instructions:  No medication changes. *If you need a refill on your cardiac medications before your next appointment, please call your pharmacy*   Lab Work: Your physician recommends that you return for lab work in: tomorrow. You need to have labs done when you are fasting.  You can come Monday through Friday 8:30 am to 12:00 pm and 1:15 to 4:30. You do not need to make an appointment as the order has already been placed.    If you have labs (blood work) drawn today and your tests are completely normal, you will receive your results only by: Marland Kitchen MyChart Message (if you have MyChart) OR . A paper copy in the mail If you have any lab test that is abnormal or we need to change your treatment, we will call you to review the results.   Testing/Procedures: None ordered   Follow-Up: At [facility], you and your health needs are our priority.  As part of our continuing mission to provide you with exceptional heart care, we have created designated Provider Care Teams.  These Care Teams include your primary Cardiologist (physician) and Advanced Practice Providers (APPs -  Physician Assistants and Nurse Practitioners) who all work together to provide you with the care you need, when you need it.  We recommend signing up for the patient portal called "MyChart".  Sign up information is provided on this After Visit Summary.  MyChart is used to connect with patients for Virtual Visits (Telemedicine).  Patients are able to view lab/test results, encounter notes, upcoming appointments, etc.  Non-urgent messages can be sent to your provider as well.   To learn more about what you can do with MyChart, go to [email or web address].    Your next appointment:   1 year(s)  The format for your next appointment:   In Person  Provider:   Berniece Salines, DO   Other Instructions NA

## 2020-06-29 DIAGNOSIS — M9905 Segmental and somatic dysfunction of pelvic region: Secondary | ICD-10-CM | POA: Diagnosis not present

## 2020-06-29 DIAGNOSIS — M9902 Segmental and somatic dysfunction of thoracic region: Secondary | ICD-10-CM | POA: Diagnosis not present

## 2020-06-29 DIAGNOSIS — M9903 Segmental and somatic dysfunction of lumbar region: Secondary | ICD-10-CM | POA: Diagnosis not present

## 2020-06-29 DIAGNOSIS — M9904 Segmental and somatic dysfunction of sacral region: Secondary | ICD-10-CM | POA: Diagnosis not present

## 2020-07-02 DIAGNOSIS — M9904 Segmental and somatic dysfunction of sacral region: Secondary | ICD-10-CM | POA: Diagnosis not present

## 2020-07-02 DIAGNOSIS — M9903 Segmental and somatic dysfunction of lumbar region: Secondary | ICD-10-CM | POA: Diagnosis not present

## 2020-07-02 DIAGNOSIS — M9902 Segmental and somatic dysfunction of thoracic region: Secondary | ICD-10-CM | POA: Diagnosis not present

## 2020-07-02 DIAGNOSIS — M9905 Segmental and somatic dysfunction of pelvic region: Secondary | ICD-10-CM | POA: Diagnosis not present

## 2020-07-06 DIAGNOSIS — M9902 Segmental and somatic dysfunction of thoracic region: Secondary | ICD-10-CM | POA: Diagnosis not present

## 2020-07-06 DIAGNOSIS — M9904 Segmental and somatic dysfunction of sacral region: Secondary | ICD-10-CM | POA: Diagnosis not present

## 2020-07-06 DIAGNOSIS — M9905 Segmental and somatic dysfunction of pelvic region: Secondary | ICD-10-CM | POA: Diagnosis not present

## 2020-07-06 DIAGNOSIS — M9903 Segmental and somatic dysfunction of lumbar region: Secondary | ICD-10-CM | POA: Diagnosis not present

## 2020-07-11 DIAGNOSIS — M9903 Segmental and somatic dysfunction of lumbar region: Secondary | ICD-10-CM | POA: Diagnosis not present

## 2020-07-11 DIAGNOSIS — M9902 Segmental and somatic dysfunction of thoracic region: Secondary | ICD-10-CM | POA: Diagnosis not present

## 2020-07-11 DIAGNOSIS — E7801 Familial hypercholesterolemia: Secondary | ICD-10-CM | POA: Diagnosis not present

## 2020-07-11 DIAGNOSIS — M9905 Segmental and somatic dysfunction of pelvic region: Secondary | ICD-10-CM | POA: Diagnosis not present

## 2020-07-11 DIAGNOSIS — M9904 Segmental and somatic dysfunction of sacral region: Secondary | ICD-10-CM | POA: Diagnosis not present

## 2020-07-12 ENCOUNTER — Telehealth: Payer: Self-pay

## 2020-07-12 DIAGNOSIS — E7801 Familial hypercholesterolemia: Secondary | ICD-10-CM

## 2020-07-12 LAB — LIPID PANEL
Chol/HDL Ratio: 4.1 ratio (ref 0.0–4.4)
Cholesterol, Total: 269 mg/dL — ABNORMAL HIGH (ref 100–199)
HDL: 66 mg/dL (ref >39)
LDL Chol Calc (NIH): 181 mg/dL — ABNORMAL HIGH (ref 0–99)
Triglycerides: 122 mg/dL (ref 0–149)
VLDL Cholesterol Cal: 22 mg/dL (ref 5–40)

## 2020-07-12 LAB — LIPOPROTEIN A (LPA): Lipoprotein (a): 120.9 nmol/L — ABNORMAL HIGH (ref <75.0)

## 2020-07-12 NOTE — Telephone Encounter (Signed)
Spoke with patient regarding results and recommendation.  Patient verbalizes understanding and is agreeable to plan of care. Advised patient to call back with any issues or concerns.  

## 2020-07-12 NOTE — Telephone Encounter (Signed)
-----   Message from Berniece Salines, DO sent at 07/12/2020  3:32 PM EDT ----- Please refer the patient to our lipid clinic.  She is a great candidate for PCSK9 inhibitor. Let her know that her LDL actually increased to 181 compared to 165 from the last lab work, and to 269 252.  Her LP(a) is still elevated.

## 2020-07-13 ENCOUNTER — Telehealth: Payer: Self-pay | Admitting: Family Medicine

## 2020-07-13 ENCOUNTER — Other Ambulatory Visit: Payer: Self-pay

## 2020-07-13 ENCOUNTER — Ambulatory Visit (INDEPENDENT_AMBULATORY_CARE_PROVIDER_SITE_OTHER): Payer: Medicaid Other | Admitting: Family Medicine

## 2020-07-13 VITALS — BP 132/80 | HR 102 | Ht 60.0 in | Wt 139.8 lb

## 2020-07-13 DIAGNOSIS — R413 Other amnesia: Secondary | ICD-10-CM

## 2020-07-13 DIAGNOSIS — M7989 Other specified soft tissue disorders: Secondary | ICD-10-CM

## 2020-07-13 DIAGNOSIS — G2581 Restless legs syndrome: Secondary | ICD-10-CM | POA: Diagnosis not present

## 2020-07-13 DIAGNOSIS — R600 Localized edema: Secondary | ICD-10-CM

## 2020-07-13 LAB — POCT URINALYSIS DIP (MANUAL ENTRY)
Bilirubin, UA: NEGATIVE
Blood, UA: NEGATIVE
Glucose, UA: NEGATIVE mg/dL
Nitrite, UA: NEGATIVE
Protein Ur, POC: NEGATIVE mg/dL
Spec Grav, UA: 1.02 (ref 1.010–1.025)
Urobilinogen, UA: 0.2 E.U./dL
pH, UA: 6 (ref 5.0–8.0)

## 2020-07-13 LAB — POCT UA - MICROSCOPIC ONLY

## 2020-07-13 NOTE — Telephone Encounter (Signed)
A copy of patient's [id number] Vaccination Record Card has been placed in the white team folder.

## 2020-07-13 NOTE — Patient Instructions (Addendum)
It was wonderful to see you today.  We are going to move forward with a work-up of your leg swelling.  If you could take a picture of your legs when they are swollen and keep a journal of how often this occurs that would be helpful.   We have gotten some labs and urine today, I will call or MyChart message you you next week with those results.  Please make sure you stretch every single night before bed and make sure that you are well-hydrated.  Try to avoid caffeine intake and stay mentally active during the day.  We will discuss a possible prescription after your labs return.   Make sure that you get some compression stockings to start wearing and elevate your legs as often as possible.  Please follow-up in the next 1-2 weeks with Dr. Jerilynn Mages.

## 2020-07-13 NOTE — Assessment & Plan Note (Deleted)
Progressive in the past 1-2 months, atypical and intermittent.

## 2020-07-13 NOTE — Telephone Encounter (Signed)
Moderna COVID-19 Vaccination's have been documented in chart. 02/01/2020 and 03/01/2020. Both given at [facility]. Ottis Stain, CMA

## 2020-07-13 NOTE — Progress Notes (Signed)
SUBJECTIVE:   CHIEF COMPLAINT / HPI: legs swelling   Alashia is a 64 year old female present for evaluation of the below:  Leg swelling: Bilateral, reports present for the past 1-2 months however has been worsening in the past 1-2 weeks. Both legs, however seems to be worse in 1 compared to the other each time it occurs, alternates. Swelling present from knees down but predominance around ankle joints bilaterally. No current swelling. Happens every couple of days and last the entire day. Hurts to walk when she is swollen, but no particular arthralgias. Feels like her feet are little colder whenever her legs are swollen. Elevating her leg sometimes helps, has not tried anything else. Denies any concurrent fever, chest pain, shortness of breath, palpitations, rash, orthopnea. No new medications. No preceding injury or trauma. Possible previous history of rheumatoid arthritis, however unclear. No known family history of autoimmune disorder. No previous DVT/PE.  Reports history of gout and this has been nothing similar to that.  She also feels like since this started her restless leg syndrome has acted up.  Not currently taking anything for this.  Feels like she constantly has to run while trying to sleep.    Office Visit from 07/13/2020 in [place] Office Visit from 02/19/2017 in [place]  Thoughts that you would be better off dead, or of hurting yourself in some way Not at all Not at all  PHQ-9 Total Score 0 0      PERTINENT  PMH / PSH: Hypertension, COPD, poor memory, IBS with history of Barrett's esophagus, craniosynostosis, second-degree AV block, migraines, anxiety, restless leg syndrome  OBJECTIVE:   BP 132/80   Pulse (!) 102   Ht 5' (1.524 m)   Wt 139 lb 12.8 oz (63.4 kg)   SpO2 97%   BMI 27.30 kg/m   General: Alert, NAD HEENT: NCAT Cardiac: RRR no m/g/r Lungs: Clear bilaterally, no increased WOB without any crackles  appreciated Msk: Moves all extremities spontaneously  Ext: Warm, dry, palpable dorsalis pedis pulses bilaterally.  No appreciable LE edema to bilateral lower extremity, however does endorse tenderness to palpation of medial lower leg above medial malleolus to mid shin.  Sensation intact to light touch throughout BLE.  5/5 extremity lower strength.  Few varicose veins that did not appear swollen.  ASSESSMENT/PLAN:   Bilateral leg edema Atypical in nature, not present on exam today.  Unclear etiology without suggestive clinical history.  Will obtain CBC, CMP, TSH, U/A to rule out contributing anemia, thyroid dysfunction, renal/liver dysfunction, or nephrotic proteinuria.  Recent echo in 01/2020 with preserved EF and mild diastolic dysfunction, doubt contributing without any additional symptomology.  Considering arthritis/synovitis given ?history of RA and possible atypical presentation of PAD given associated extremity coolness when occurs, reassuringly vascularly intact today so less likely.  Recommend wearing compression stockings, elevating extremity, and taking pictures of her LE when flared/keeping journal of occurrence to bring in on follow-up visit.  Restless leg syndrome Poorly controlled.  Has had previous extensive medication trial in the past and discontinued 2/2 side effects.  Will obtain ferritin to ensure no iron deficiency contributing.  Encouraged nightly muscle stretching prior to bedtime and adequate hydration, will hold off on retrialing medication as several could complicate current presentation and contribute to swelling.  Memory change Chronic with unknown etiology, poor historian and requires husband to add in additional details.  Previous evaluation showing normal MRI brain in 2016 with unremarkable labs (HIV, RPR,  TSH etc.).  No concurrent depression to suggest contribution.  Previously discussed proceeding with full memory assessment, will defer discussion to her PCP, Dr. Jerilynn Mages, on  follow-up.    Discussed with Dr. Owens Shark.  Follow-up in 1-2 weeks for above with PCP or sooner if needed.   Patriciaann Clan, [place]

## 2020-07-14 LAB — CBC WITH DIFFERENTIAL/PLATELET
Basophils Absolute: 0.1 10*3/uL (ref 0.0–0.2)
Basos: 1 %
EOS (ABSOLUTE): 0.1 10*3/uL (ref 0.0–0.4)
Eos: 2 %
Hematocrit: 41.7 % (ref 34.0–46.6)
Hemoglobin: 13.4 g/dL (ref 11.1–15.9)
Immature Grans (Abs): 0 10*3/uL (ref 0.0–0.1)
Immature Granulocytes: 1 %
Lymphocytes Absolute: 2.7 10*3/uL (ref 0.7–3.1)
Lymphs: 38 %
MCH: 28.9 pg (ref 26.6–33.0)
MCHC: 32.1 g/dL (ref 31.5–35.7)
MCV: 90 fL (ref 79–97)
Monocytes Absolute: 0.5 10*3/uL (ref 0.1–0.9)
Monocytes: 8 %
Neutrophils Absolute: 3.7 10*3/uL (ref 1.4–7.0)
Neutrophils: 50 %
Platelets: 236 10*3/uL (ref 150–450)
RBC: 4.63 x10E6/uL (ref 3.77–5.28)
RDW: 11.9 % (ref 11.7–15.4)
WBC: 7.1 10*3/uL (ref 3.4–10.8)

## 2020-07-14 LAB — COMPREHENSIVE METABOLIC PANEL
ALT: 23 IU/L (ref 0–32)
AST: 25 IU/L (ref 0–40)
Albumin/Globulin Ratio: 2.2 (ref 1.2–2.2)
Albumin: 4.4 g/dL (ref 3.8–4.8)
Alkaline Phosphatase: 102 IU/L (ref 48–121)
BUN/Creatinine Ratio: 6 — ABNORMAL LOW (ref 12–28)
BUN: 5 mg/dL — ABNORMAL LOW (ref 8–27)
Bilirubin Total: 0.4 mg/dL (ref 0.0–1.2)
CO2: 24 mmol/L (ref 20–29)
Calcium: 9.4 mg/dL (ref 8.7–10.3)
Chloride: 96 mmol/L (ref 96–106)
Creatinine, Ser: 0.8 mg/dL (ref 0.57–1.00)
GFR calc Af Amer: 92 mL/min/{1.73_m2} (ref >59)
GFR calc non Af Amer: 80 mL/min/{1.73_m2} (ref >59)
Globulin, Total: 2 g/dL (ref 1.5–4.5)
Glucose: 87 mg/dL (ref 65–99)
Potassium: 4.6 mmol/L (ref 3.5–5.2)
Sodium: 132 mmol/L — ABNORMAL LOW (ref 134–144)
Total Protein: 6.4 g/dL (ref 6.0–8.5)

## 2020-07-14 LAB — TSH: TSH: 2.19 u[IU]/mL (ref 0.450–4.500)

## 2020-07-14 LAB — FERRITIN: Ferritin: 39 ng/mL (ref 15–150)

## 2020-07-16 ENCOUNTER — Encounter: Payer: Self-pay | Admitting: Family Medicine

## 2020-07-16 DIAGNOSIS — R600 Localized edema: Secondary | ICD-10-CM

## 2020-07-16 HISTORY — DX: Localized edema: R60.0

## 2020-07-16 NOTE — Assessment & Plan Note (Addendum)
Chronic with unknown etiology, poor historian and requires husband to add in additional details.  Previous evaluation showing normal MRI brain in 2016 with unremarkable labs (HIV, RPR, TSH etc.).  No concurrent depression to suggest contribution.  Previously discussed proceeding with full memory assessment, will defer discussion to her PCP, Dr. Jerilynn Mages, on follow-up.

## 2020-07-16 NOTE — Assessment & Plan Note (Addendum)
Atypical in nature, not present on exam today.  Unclear etiology without suggestive clinical history.  Will obtain CBC, CMP, TSH, U/A to rule out contributing anemia, thyroid dysfunction, renal/liver dysfunction, or nephrotic proteinuria.  Recent echo in 01/2020 with preserved EF and mild diastolic dysfunction, doubt contributing without any additional symptomology.  Considering arthritis/synovitis given ?history of RA and possible atypical presentation of PAD given associated extremity coolness when occurs, reassuringly vascularly intact today so less likely.  Recommend wearing compression stockings, elevating extremity, and taking pictures of her LE when flared/keeping journal of occurrence to bring in on follow-up visit.

## 2020-07-16 NOTE — Assessment & Plan Note (Addendum)
Poorly controlled.  Has had previous extensive medication trial in the past and discontinued 2/2 side effects.  Will obtain ferritin to ensure no iron deficiency contributing.  Encouraged nightly muscle stretching prior to bedtime and adequate hydration, will hold off on retrialing medication as several could complicate current presentation and contribute to swelling.

## 2020-07-18 DIAGNOSIS — M9903 Segmental and somatic dysfunction of lumbar region: Secondary | ICD-10-CM | POA: Diagnosis not present

## 2020-07-18 DIAGNOSIS — M9905 Segmental and somatic dysfunction of pelvic region: Secondary | ICD-10-CM | POA: Diagnosis not present

## 2020-07-18 DIAGNOSIS — M9902 Segmental and somatic dysfunction of thoracic region: Secondary | ICD-10-CM | POA: Diagnosis not present

## 2020-07-18 DIAGNOSIS — M9904 Segmental and somatic dysfunction of sacral region: Secondary | ICD-10-CM | POA: Diagnosis not present

## 2020-07-25 DIAGNOSIS — M9902 Segmental and somatic dysfunction of thoracic region: Secondary | ICD-10-CM | POA: Diagnosis not present

## 2020-07-25 DIAGNOSIS — M9904 Segmental and somatic dysfunction of sacral region: Secondary | ICD-10-CM | POA: Diagnosis not present

## 2020-07-25 DIAGNOSIS — M9903 Segmental and somatic dysfunction of lumbar region: Secondary | ICD-10-CM | POA: Diagnosis not present

## 2020-07-25 DIAGNOSIS — M9905 Segmental and somatic dysfunction of pelvic region: Secondary | ICD-10-CM | POA: Diagnosis not present

## 2020-07-26 ENCOUNTER — Ambulatory Visit: Payer: Medicaid Other | Admitting: Family Medicine

## 2020-07-26 ENCOUNTER — Encounter: Payer: Self-pay | Admitting: Family Medicine

## 2020-07-26 ENCOUNTER — Other Ambulatory Visit: Payer: Self-pay

## 2020-07-26 VITALS — BP 120/62 | HR 102 | Ht 60.0 in | Wt 142.0 lb

## 2020-07-26 DIAGNOSIS — Z789 Other specified health status: Secondary | ICD-10-CM | POA: Diagnosis not present

## 2020-07-26 DIAGNOSIS — N898 Other specified noninflammatory disorders of vagina: Secondary | ICD-10-CM | POA: Diagnosis not present

## 2020-07-26 DIAGNOSIS — N952 Postmenopausal atrophic vaginitis: Secondary | ICD-10-CM

## 2020-07-26 DIAGNOSIS — I10 Essential (primary) hypertension: Secondary | ICD-10-CM

## 2020-07-26 LAB — POCT WET PREP (WET MOUNT)
Clue Cells Wet Prep Whiff POC: NEGATIVE
Trichomonas Wet Prep HPF POC: ABSENT

## 2020-07-26 MED ORDER — ESTRADIOL 2 MG VA RING: 2.0000 mg | VAGINAL_RING | VAGINAL | 12 refills | Status: DC

## 2020-07-26 NOTE — Progress Notes (Signed)
    SUBJECTIVE:   CHIEF COMPLAINT / HPI:   Hypertension Patient brings BP log with her Reports 20 numbers, systolics as low as 300 and as high as 150s Reports that she has not noticed a trend to this and feels that they're constantly fluctuating Current regimen: norvasc 5mg  Denies chest pain, shortness of breath  Supplements Patient recently saw a nutritionist and was started on many supplements She brings multiple supplements with her to have looked at Many have "proprietary blends" with non-specific ingredients  Vaginal Itching Patient reports a "bump" on the outside of her vagina that she states has been there for "a while" and sometimes itches Not painful, no change in vaginal discharge Not concerned for STDs  Has a history of total hysterectomy, is menopausal Reports vaginal dryness and as used creams in the past without good success, including premarin and estrace per chart review She is wondering if there is something she can do to help with this   PERTINENT  PMH / PSH: HTN, HLD, COPD, CKD, Anxiety, RLS  OBJECTIVE:   BP 120/62   Pulse (!) 102   Ht 5' (1.524 m)   Wt 142 lb (64.4 kg)   SpO2 97%   BMI 27.73 kg/m    Physical Exam:  General: 64 y.o. female in NAD Lungs: breathing comfortably on RA Skin: warm and dry Extremities: No edema GU: Pelvic exam performed with patient supine.  Chaperone in room.  Bilateral labia without abnormalities aside from atrophic appearing tissue, one prominent and non-erythematous sebaceous gland at 12 o'clock superior to clitoris, no inguinal LAD palpated.  No cervix present, vaginal cuff without abnormalities.  No vaginal lesions.  Vaginal discharge small amount, clear.    ASSESSMENT/PLAN:   Hypertension, essential, benign Given fluctuating values, patient would benefit from ambulatory blood pressure monitoring.  She agrees to this in order to make better decisions in regard to her HTN management.  She was advised to make an  appointment with Dr. Valentina Lucks for this at her convenience.  For now, will continue with current regimen.  Takes dietary supplements Advised patient that FDA does not regulate supplements and therefore cannot give specific information on proprietary blends.  Also advised that she can take them at her own risk if she feels that they are helpful, but cannot advise her in an educated manner for or against these supplements.  Atrophic vaginitis Sebaceous gland was "bump" that patient described, likely more prominent 2/2 blockage.  Does not appear infected.  Advised warm compresses and to return if worsening.  For atrophic vaginitis, has tried premarin and estrace cream without improvement.  Will start estradiol vaginal rings to help with symptoms.   F/U in 1-2 months for BP management and other chronic health maintenance.  Cleophas Dunker, [place]

## 2020-07-26 NOTE — Patient Instructions (Signed)
Thank you for coming to see me today. It was a pleasure. Today we talked about:   Make an appointment with Dr. Valentina Lucks the pharmacist for ambulatory blood pressure monitoring.  Use a warm compress on the area at least 4 times a day and that should help with the area.  Please follow-up with me in 1-2 months  If you have any questions or concerns, please do not hesitate to call the office at [phone].  Best,   [place] Constable, DO

## 2020-07-28 DIAGNOSIS — Z789 Other specified health status: Secondary | ICD-10-CM | POA: Insufficient documentation

## 2020-07-28 HISTORY — DX: Other specified health status: Z78.9

## 2020-07-28 NOTE — Assessment & Plan Note (Addendum)
Wet prep negative.  Sebaceous gland was "bump" that patient described, likely more prominent 2/2 blockage.  Does not appear infected.  Advised warm compresses and to return if worsening.  For atrophic vaginitis, has tried premarin and estrace cream without improvement.  Will start estradiol vaginal rings to help with symptoms.

## 2020-07-28 NOTE — Assessment & Plan Note (Signed)
Advised patient that FDA does not regulate supplements and therefore cannot give specific information on proprietary blends.  Also advised that she can take them at her own risk if she feels that they are helpful, but cannot advise her in an educated manner for or against these supplements.

## 2020-07-28 NOTE — Assessment & Plan Note (Signed)
Given fluctuating values, patient would benefit from ambulatory blood pressure monitoring.  She agrees to this in order to make better decisions in regard to her HTN management.  She was advised to make an appointment with Dr. Valentina Lucks for this at her convenience.  For now, will continue with current regimen.

## 2020-07-30 ENCOUNTER — Ambulatory Visit: Payer: Medicaid Other | Admitting: Pharmacist

## 2020-07-30 ENCOUNTER — Encounter: Payer: Self-pay | Admitting: Pharmacist

## 2020-07-30 ENCOUNTER — Other Ambulatory Visit: Payer: Self-pay

## 2020-07-30 DIAGNOSIS — I10 Essential (primary) hypertension: Secondary | ICD-10-CM

## 2020-07-30 NOTE — Progress Notes (Signed)
S:    8/30 Patient arrives in good spirits.    Presents to the clinic for ambulatory blood pressure evaluation.  Patient reports she took her blood pressure medication this morning. Reports she has been having some leg swelling that started several months ago, the left leg more than the right. She isn't sure what exactly causes this. She checks her blood pressure at home with an arm cuff. She hasn't had any symptoms of low blood pressure lately (blood pressure isn't often low), but does have headaches and blurred vision when her blood pressure is high. Patient reports taking ~20 herbal medications.   8/31 Patient arrives in good spirits. Present to the clinic for ambulatory blood pressure follow up. She reports she could tell her blood pressure was elevated in the evening because of how tight the cuff was inflating. She fell asleep around 11:30pm and woke up at 6am. She also brought her herbal medications in with her today. Reports she was doing the same thing when her blood pressure was lower as she was when it spiked in the evening (resting on the couch). She did not have any headaches yesterday.   Patient was referred and last seen by Primary Care Provider on 07/26/20.   Medication compliance is reported to be compliant.  Discussed procedure for wearing the monitor and gave patient written instructions. Monitor was placed on non-dominant arm with instructions to return in the morning.   Current BP Medications include:  Amlodipine 5mg  daily - takes every morning  Reports recent blood pressure fluctuate: morning BP 120/81, that night SBP 159, the next morning SBP 160. Not consistently high/normal at any point during the day.   Antihypertensives tried in the past include: per chart review - losartan 25 mg; patient reports she has tried about 7 different blood pressure medications that she can't remember the names of  Dietary habits include: reports lower sodium intake lately Exercise habits  include: some walking, takes line dancing class every Tuesday.   List of herbal medications:  Brand Name Reported Dosing Ingredients  Spleen desiccated 1 tab TID Cholesterol  Renafood 1 tab TID Vitamin A, vitamin C, sodium  Livaplex 1 tab TID Vitamin A, niacin, B6, iron, iodine, zinc, copper, sodium  Thytrophin PMC 1 tab TID Calcium, sodium  Cataplex G 1 tab QID Vitamin C, riboflavin, niacin, B6, choline  Thymex 2 tab TID Cholesterol, vitamin C  Symplex F 1 tab TID Calcium, sodium  Cataplex F 1 tab QID B6, iodine  Albaplex 2 tab QID Vitamin A, C, niacin, B6, sodium  Emphaplex 2 tab TID Cholesterol, A, C, D, thiamine, riboflavin, B6, choline, sodium  Congaplex 2 tab TID A, C, calcium, magnesium  Antronex 2 tab TID Calcium  Tuna omega-3 fish oil 2 tab BID Tuna oil, DHA, EPA  A-C Carbamide 3 tab BID Vitamin A, C     O:   Physical Exam Constitutional:      Appearance: Normal appearance.  Cardiovascular:     Rate and Rhythm: Normal rate.  Musculoskeletal:        General: Swelling present.     Comments: 1+ pitting edema R>L LE  Neurological:     Mental Status: She is alert.  Psychiatric:        Mood and Affect: Mood normal.        Behavior: Behavior normal.        Thought Content: Thought content normal.      Review of Systems  Cardiovascular: Negative for leg  swelling.  Neurological: Positive for headaches.       Reports having a headache last night.   All other systems reviewed and are negative.   Last 3 Office BP readings: BP Readings from Last 3 Encounters:  07/30/20 (!) 148/86  07/26/20 120/62  07/13/20 132/80     Clinical Atherosclerotic Cardiovascular Disease (ASCVD): No  The 10-year ASCVD risk score Mikey Bussing DC Jr., et al., 2013) is: 14.4%   Values used to calculate the score:     Age: 64 years     Sex: Female     Is Non-Hispanic African American: No     Diabetic: No     Tobacco smoker: Yes     Systolic Blood Pressure: 062 mmHg     Is BP treated: Yes      HDL Cholesterol: 66 mg/dL     Total Cholesterol: 269 mg/dL  Basic Metabolic Panel    Component Value Date/Time   NA 132 (L) 07/13/2020 1443   K 4.6 07/13/2020 1443   CL 96 07/13/2020 1443   CO2 24 07/13/2020 1443   GLUCOSE 87 07/13/2020 1443   GLUCOSE 67 05/24/2018 1305   BUN 5 (L) 07/13/2020 1443   CREATININE 0.80 07/13/2020 1443   CREATININE 0.64 10/31/2015 1444   CALCIUM 9.4 07/13/2020 1443   GFRNONAA 80 07/13/2020 1443   GFRNONAA >89 10/31/2015 1444   GFRAA 92 07/13/2020 1443   GFRAA >89 10/31/2015 1444    Renal function: Estimated Creatinine Clearance: 62.8 mL/min (by C-G formula based on SCr of 0.8 mg/dL).   Today's Office Blood Pressure (BP) reading: 148/86 mmHg (manual reading)  ABPM Study Data: Arm Placement left arm   Overall Mean 24hr BP:   142/76 mmHg HR: 90   Daytime Mean BP:  149/81 mmHg HR: 93   Nighttime Mean BP:  121/62 mmHg HR: 84   Dipping Pattern: Yes.    Sys:   19.2%   Dia: 23.6%   [normal dipping ~10-20%]  Non-hypertensive ABPM thresholds: daytime BP <125/75 mmHg, sleeptime BP <120/70 mmHg     A/P:  History of hypertension, found to have isolated systolic hypertension given 24-hour ambulatory blood pressure demonstrates elevated systolic blood pressure particularly in the evening with blood pressure dipping low at night (as low as 80/41). Average blood pressure of 142/76 mmHg, and a nocturnal dipping pattern that is normal. Suspect swelling secondary to amlodipine. History of Migraine, Since heart rate is slightly elevated with a mean HR of 93 during the day, will add a beta blocker that may also have some benefit in headaches and help lower BP.  Changes to medications:  - Decrease amlodipine to 2.5 mg daily (take with evening meal) - Add carvedilol 3.125 mg twice daily.   (Consider dose increase at next PCP visit).   Due to duplicity of many herbal medication ingredients, recommended discontinuing ones with similar ingredients (see chart  above)--provided list to patient.    Results reviewed and written information provided.  Total time in face-to-face counseling 30 minutes.   F/U Clinic Visit with PCP and return to pharmacy clinic as needed. Patient seen with  Rebbeca Paul, PharmD - PGY-1 Resident.

## 2020-07-30 NOTE — Patient Instructions (Addendum)
Start taking carvedilol 3.125 mg twice daily.   Decrease amlodipine to 2.5 mg daily - start taking with dinner.   Continue to monitor your blood pressure daily at home.   Follow up with your PCP at the end of the month.

## 2020-07-31 MED ORDER — AMLODIPINE BESYLATE 2.5 MG PO TABS: 2.5000 mg | ORAL_TABLET | Freq: Every day | ORAL | 3 refills | Status: DC

## 2020-07-31 MED ORDER — CARVEDILOL 3.125 MG PO TABS: 3.1250 mg | ORAL_TABLET | Freq: Two times a day (BID) | ORAL | 3 refills | Status: DC

## 2020-07-31 NOTE — Assessment & Plan Note (Signed)
History of hypertension, found to have isolated systolic hypertension given 24-hour ambulatory blood pressure demonstrates elevated systolic blood pressure particularly in the evening with blood pressure dipping low at night (as low as 80/41). Average blood pressure of 142/76 mmHg, and a nocturnal dipping pattern that is normal. Suspect swelling secondary to amlodipine. History of Migraine, Since heart rate is slightly elevated with a mean HR of 93 during the day, will add a beta blocker that may also have some benefit in headaches and help lower BP.  Changes to medications:  - Decrease amlodipine to 2.5 mg daily (take with evening meal) - Add carvedilol 3.125 mg twice daily.   (Consider dose increase at next PCP visit).

## 2020-07-31 NOTE — Progress Notes (Signed)
Reviewed: I agree with Dr. Koval's documentation and management. 

## 2020-08-13 DIAGNOSIS — R05 Cough: Secondary | ICD-10-CM | POA: Diagnosis not present

## 2020-08-13 DIAGNOSIS — Z20828 Contact with and (suspected) exposure to other viral communicable diseases: Secondary | ICD-10-CM | POA: Diagnosis not present

## 2020-08-15 DIAGNOSIS — R05 Cough: Secondary | ICD-10-CM | POA: Diagnosis not present

## 2020-08-15 DIAGNOSIS — Z20828 Contact with and (suspected) exposure to other viral communicable diseases: Secondary | ICD-10-CM | POA: Diagnosis not present

## 2020-08-15 DIAGNOSIS — L271 Localized skin eruption due to drugs and medicaments taken internally: Secondary | ICD-10-CM | POA: Diagnosis not present

## 2020-08-16 ENCOUNTER — Ambulatory Visit: Payer: Medicaid Other

## 2020-08-28 ENCOUNTER — Encounter: Payer: Self-pay | Admitting: Family Medicine

## 2020-08-28 ENCOUNTER — Other Ambulatory Visit: Payer: Self-pay

## 2020-08-28 ENCOUNTER — Ambulatory Visit: Payer: Medicaid Other | Admitting: Family Medicine

## 2020-08-28 VITALS — BP 148/82 | HR 84 | Ht 60.0 in | Wt 139.2 lb

## 2020-08-28 DIAGNOSIS — I10 Essential (primary) hypertension: Secondary | ICD-10-CM | POA: Diagnosis not present

## 2020-08-28 DIAGNOSIS — Z23 Encounter for immunization: Secondary | ICD-10-CM | POA: Diagnosis not present

## 2020-08-28 DIAGNOSIS — E871 Hypo-osmolality and hyponatremia: Secondary | ICD-10-CM

## 2020-08-28 DIAGNOSIS — N952 Postmenopausal atrophic vaginitis: Secondary | ICD-10-CM | POA: Diagnosis not present

## 2020-08-28 DIAGNOSIS — E785 Hyperlipidemia, unspecified: Secondary | ICD-10-CM

## 2020-08-28 HISTORY — DX: Hypo-osmolality and hyponatremia: E87.1

## 2020-08-28 MED ORDER — CARVEDILOL 6.25 MG PO TABS: 6.2500 mg | ORAL_TABLET | Freq: Two times a day (BID) | ORAL | 3 refills | Status: DC

## 2020-08-28 MED ORDER — LOSARTAN POTASSIUM 25 MG PO TABS: 25.0000 mg | ORAL_TABLET | Freq: Every day | ORAL | 1 refills | Status: DC

## 2020-08-28 NOTE — Assessment & Plan Note (Signed)
Noted on last 2 BMPs.  We will go ahead and obtain a BMP today and assess as needed.

## 2020-08-28 NOTE — Assessment & Plan Note (Signed)
Discussed with patient that she does not have to use only for intercourse and that using regularly could help improve with moisture and comfort, but she does not have to use if she is not worried about these things.  She voiced understanding and states that she will consider starting if she desires.

## 2020-08-28 NOTE — Assessment & Plan Note (Signed)
Patient has previously been intolerant of multiple statins and now unable to take will follow secondary to possible dizziness, that resolved with discontinuing.  She has already been scheduled for an appointment with lipid clinic on 10/12 given likely familial hyperlipidemia.  She will follow-up with them as scheduled for consideration of PSK 9 inhibitor.

## 2020-08-28 NOTE — Patient Instructions (Signed)
Thank you for coming to see me today. It was a pleasure. Today we talked about:   Follow up with the lipid clinic.  We will get some labs today. I'll call you with the results.  We will increase carvedilol to 6.25 mg twice daily.  If you have dizziness, or feel unwell with this or have changes in your vision, let me know right away.  Please follow-up with me in 1 month.  If you have any questions or concerns, please do not hesitate to call the office at [phone].  Best,   [place] Constable, DO

## 2020-08-28 NOTE — Assessment & Plan Note (Signed)
Initially had discussed increasing carvedilol to 6.25 mg twice daily, but on further review of patient's chart, it appears that second-degree AV block was a Mobitz type II, which is more worrisome than a type I.  Given this, would not increase her beta-blocker at this time.  For now, we will continue at 3.125 mg twice daily per Dr. Graylin Shiver recommendation, but will discuss with him in the future to ensure that he was aware of patient's history of heart block.  Discussed with patient at length reason for discontinuing losartan, which she cannot remember.  Is also not stated in the chart, and on discontinuation it appears that reason listed was "error."  Obtain BMP today, restart losartan at 25 mg daily.  Advised patient that if she has any side effects or symptoms, to let me know right away.  Follow-up in 1 month.

## 2020-08-28 NOTE — Progress Notes (Signed)
SUBJECTIVE:   CHIEF COMPLAINT / HPI:   Hyponatremia Last CMP on 8/13 with sodium of 132, 5 months prior to that was 133  Atrophic vaginitis Prescribed estradiol vaginal rings on 8/26 Hasn't used yet because she has not had intercourse  Hypertension Patient seen by Dr. Valentina Lucks for 24-hour ambulatory blood pressure monitoring that showed elevated SBP in the evening with blood pressure dipping low at night as low as 80/41 Also reported that patient having lower extremity swelling which is likely secondary to amlodipine He decreased amlodipine to 2.5 mg daily Carvedilol also added at 3.125 mg twice daily for blood pressure as well as heart rate in the 90s and history of migraine BP at home 140s-160s in PM, 120s in AM Doing well with the changes Previously had been on atenolol and discontinued due to second degree type 2 AV block on recorder by cardiology, was having dizziness at the time Previously was on losartan but was discontinued last month and unclear reason why Patient doesn't recall that it caused a problem  HLD Previously on Livalo 4 mg daily Stop this 1/2 weeks ago because she thought it was causing dizziness and states that her dizziness resolved after stopping She has previously tried other statins and has been intolerant in the past Last lipid panel 07/11/20 and has elevated on 8/11 Follows with cardiology, was already scheduled with Lipid clinic for PSK9 inhibitor on 10/12  PERTINENT  PMH / PSH: HTN, HLD, COPD, CKD, Anxiety, RLS  OBJECTIVE:   BP (!) 148/82   Pulse 84   Ht 5' (1.524 m)   Wt 139 lb 3.2 oz (63.1 kg)   SpO2 97%   BMI 27.19 kg/m    Physical Exam:  General: 64 y.o. female in NAD Cardio: RRR no m/r/g Lungs: CTAB, no wheezing, no rhonchi, no crackles, no IWOB on RA Skin: warm and dry Extremities: Trace BLE   ASSESSMENT/PLAN:   Hyponatremia Noted on last 2 BMPs.  We will go ahead and obtain a BMP today and assess as needed.  Atrophic  vaginitis Discussed with patient that she does not have to use only for intercourse and that using regularly could help improve with moisture and comfort, but she does not have to use if she is not worried about these things.  She voiced understanding and states that she will consider starting if she desires.  Hypertension, essential, benign Initially had discussed increasing carvedilol to 6.25 mg twice daily, but on further review of patient's chart, it appears that second-degree AV block was a Mobitz type II, which is more worrisome than a type I.  Given this, would not increase her beta-blocker at this time.  For now, we will continue at 3.125 mg twice daily per Dr. Graylin Shiver recommendation, but will discuss with him in the future to ensure that he was aware of patient's history of heart block.  Discussed with patient at length reason for discontinuing losartan, which she cannot remember.  Is also not stated in the chart, and on discontinuation it appears that reason listed was "error."  Obtain BMP today, restart losartan at 25 mg daily.  Advised patient that if she has any side effects or symptoms, to let me know right away.  Follow-up in 1 month.  Hyperlipidemia Patient has previously been intolerant of multiple statins and now unable to take will follow secondary to possible dizziness, that resolved with discontinuing.  She has already been scheduled for an appointment with lipid clinic on 10/12 given likely  familial hyperlipidemia.  She will follow-up with them as scheduled for consideration of PSK 9 inhibitor.     Cleophas Dunker, [place]

## 2020-08-29 ENCOUNTER — Other Ambulatory Visit: Payer: Self-pay | Admitting: Family Medicine

## 2020-08-29 DIAGNOSIS — Z87891 Personal history of nicotine dependence: Secondary | ICD-10-CM

## 2020-08-29 LAB — BASIC METABOLIC PANEL
BUN/Creatinine Ratio: 7 — ABNORMAL LOW (ref 12–28)
BUN: 6 mg/dL — ABNORMAL LOW (ref 8–27)
CO2: 22 mmol/L (ref 20–29)
Calcium: 9.5 mg/dL (ref 8.7–10.3)
Chloride: 99 mmol/L (ref 96–106)
Creatinine, Ser: 0.82 mg/dL (ref 0.57–1.00)
GFR calc Af Amer: 89 mL/min/{1.73_m2} (ref >59)
GFR calc non Af Amer: 77 mL/min/{1.73_m2} (ref >59)
Glucose: 83 mg/dL (ref 65–99)
Potassium: 4.1 mmol/L (ref 3.5–5.2)
Sodium: 132 mmol/L — ABNORMAL LOW (ref 134–144)

## 2020-08-29 NOTE — Progress Notes (Signed)
Low dose CT ordered for screening.  8>80 pack year history (started smoking at 20 and smoked 2 ppd, only now working down to 3/4 ppd).

## 2020-08-31 ENCOUNTER — Telehealth: Payer: Self-pay | Admitting: Family Medicine

## 2020-08-31 NOTE — Telephone Encounter (Signed)
Called patient.  No answer, left VM.  Advised that spoke with Dr. Valentina Lucks, and losartan is likely better choice.  Will have her discontinue coreg given her heart history.  Advised to call if questions.  If she calls back, please reiterate to stop taking coreg, continue to keep track of her blood pressures, and continue to take losartan.  If she is having problems, please let me know.

## 2020-09-03 DIAGNOSIS — M9902 Segmental and somatic dysfunction of thoracic region: Secondary | ICD-10-CM | POA: Diagnosis not present

## 2020-09-03 DIAGNOSIS — M9904 Segmental and somatic dysfunction of sacral region: Secondary | ICD-10-CM | POA: Diagnosis not present

## 2020-09-03 DIAGNOSIS — M9905 Segmental and somatic dysfunction of pelvic region: Secondary | ICD-10-CM | POA: Diagnosis not present

## 2020-09-03 DIAGNOSIS — M9903 Segmental and somatic dysfunction of lumbar region: Secondary | ICD-10-CM | POA: Diagnosis not present

## 2020-09-05 DIAGNOSIS — M9904 Segmental and somatic dysfunction of sacral region: Secondary | ICD-10-CM | POA: Diagnosis not present

## 2020-09-05 DIAGNOSIS — M9905 Segmental and somatic dysfunction of pelvic region: Secondary | ICD-10-CM | POA: Diagnosis not present

## 2020-09-05 DIAGNOSIS — M9902 Segmental and somatic dysfunction of thoracic region: Secondary | ICD-10-CM | POA: Diagnosis not present

## 2020-09-05 DIAGNOSIS — M9903 Segmental and somatic dysfunction of lumbar region: Secondary | ICD-10-CM | POA: Diagnosis not present

## 2020-09-07 ENCOUNTER — Other Ambulatory Visit: Payer: Self-pay | Admitting: *Deleted

## 2020-09-07 DIAGNOSIS — M9904 Segmental and somatic dysfunction of sacral region: Secondary | ICD-10-CM | POA: Diagnosis not present

## 2020-09-07 DIAGNOSIS — M9902 Segmental and somatic dysfunction of thoracic region: Secondary | ICD-10-CM | POA: Diagnosis not present

## 2020-09-07 DIAGNOSIS — M9903 Segmental and somatic dysfunction of lumbar region: Secondary | ICD-10-CM | POA: Diagnosis not present

## 2020-09-07 DIAGNOSIS — M9905 Segmental and somatic dysfunction of pelvic region: Secondary | ICD-10-CM | POA: Diagnosis not present

## 2020-09-07 DIAGNOSIS — F419 Anxiety disorder, unspecified: Secondary | ICD-10-CM

## 2020-09-07 MED ORDER — VENLAFAXINE HCL ER 37.5 MG PO CP24: ORAL_CAPSULE | ORAL | 3 refills | Status: DC

## 2020-09-10 DIAGNOSIS — M9902 Segmental and somatic dysfunction of thoracic region: Secondary | ICD-10-CM | POA: Diagnosis not present

## 2020-09-10 DIAGNOSIS — M9904 Segmental and somatic dysfunction of sacral region: Secondary | ICD-10-CM | POA: Diagnosis not present

## 2020-09-10 DIAGNOSIS — M9903 Segmental and somatic dysfunction of lumbar region: Secondary | ICD-10-CM | POA: Diagnosis not present

## 2020-09-10 DIAGNOSIS — M9905 Segmental and somatic dysfunction of pelvic region: Secondary | ICD-10-CM | POA: Diagnosis not present

## 2020-09-11 ENCOUNTER — Other Ambulatory Visit: Payer: Self-pay

## 2020-09-11 ENCOUNTER — Ambulatory Visit (INDEPENDENT_AMBULATORY_CARE_PROVIDER_SITE_OTHER): Payer: Medicaid Other | Admitting: Pharmacist

## 2020-09-11 VITALS — BP 150/94 | HR 102 | Resp 17 | Ht 60.0 in | Wt 137.4 lb

## 2020-09-11 DIAGNOSIS — E7801 Familial hypercholesterolemia: Secondary | ICD-10-CM

## 2020-09-11 NOTE — Patient Instructions (Addendum)
Your Results:             Your most recent labs Goal  Total Cholesterol 269 < 200  Triglycerides 122 < 150  HDL (happy/good cholesterol) 66 > 40  LDL (lousy/bad cholesterol 181 < 70     Medication changes: *START paperwork for Repatha SureClick 140mg  every 14 days*  Lab orders: *Repeat fasting blood work after 4 doses of new medication  Clinic phone number: [phone] (Ahmiyah Coil/[place])    Thank you for choosing [facility]

## 2020-09-11 NOTE — Progress Notes (Signed)
Patient ID: [name]                 DOB: 1956-06-18                    MRN: [id number]     HPI: Diana [name] is a 64 y.o. female patient referred to lipid clinic by Dr Diana [name]. 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 Noted failed trial with 5 different statins and ezetimibe. Patient is current on omega-3 , but no other pharmacotherapy for lipid management.  Current Medications:  Omega-3 fatty acid ; 2 capsules twice daily  Intolerances:  Rosuvastatin - muscle pain Atorvastatin 40mg  - muscle pain Lovastatin 0mg  - muscle pain Pitavastatin 4mg  - muscle pain Pitavastatin 1mg  - muscle pain Ezetimibe 10mg  - balance issues and muscle pain  LDL goal: 70mg /dL  Diet: cut out cheeses, and fatty foods, and fish  Exercise: walking 1 mile every day  Family History: The patient's family history includes Colon cancer in her paternal grandfather; Hypertension in her father and mother; Skin cancer in her father. There is no history of Rectal cancer, Stomach cancer, or Esophageal cancer. Strokes in mother and father.  Social History: decreasing amount cigarretes, 3 less cigarettes per day, occasional EtOH  Labs: 8/11/201: CHO 269, TG 122, HDL 66, LDL-c 181  Past Medical History:  Diagnosis Date  . Anxiety   . Asthma   . Barrett esophagus    "don't know that I've ever been stretched" (08/17/2013)  . Choledocholithiasis with acute cholecystitis   . Chronic lower back pain   . COPD (chronic obstructive pulmonary disease) (Diana [name])   . Craniosynostosis    congenital  . DDD (degenerative disc disease)   . Family history of anesthesia complication    "PONV; both parents" (08/17/2013)  . GERD (gastroesophageal reflux disease)   . H/O hiatal hernia   . History of blood transfusion 1959-1960  . Hollenhorst plaque    Left eye  . Hypercholesterolemia 05/28/2012   Has taken Crestor and Lipitor in the past.  Switched to lovastatin b/c its $4.    . Hypertension   . IBS (irritable bowel syndrome)   . Migraines    "since age 50; probably twice/month" (08/17/2013)  . PONV (postoperative nausea and vomiting)   . Rheumatoid arthritis (Diana [name])    "in my knuckles" (08/17/2013)  . Ruptured lumbar disc 1990's   L4-L5  . Shingles     Current Outpatient Medications on File Prior to Visit  Medication Sig Dispense Refill  . albuterol (PROVENTIL HFA;VENTOLIN HFA) 108 (90 Base) MCG/ACT inhaler Inhale 2 puffs into the lungs every 6 (six) hours as needed for wheezing. 8.5 g 5  . allopurinol (ZYLOPRIM) 100 MG tablet Take 1 tablet (100 mg total) by mouth daily. 30 tablet 6  . amLODipine (NORVASC) 2.5 MG tablet Take 1 tablet (2.5 mg total) by mouth daily with supper. 90 tablet 3  . aspirin-acetaminophen-caffeine (EXCEDRIN MIGRAINE) 250-250-65 MG tablet Take 2 tablets by mouth every 6 (six) hours as needed for headache.    . diclofenac sodium (VOLTAREN) 1 % GEL Apply 2 g topically 4 (four) times daily as needed. 100 g 5  . esomeprazole (NEXIUM) 40 MG capsule Take 1 capsule (40 mg total) by mouth daily at 12 noon. 90 capsule 1  . linaclotide (LINZESS) 145 MCG CAPS capsule Take 1 capsule (145 mcg total) by mouth daily before breakfast. 30 capsule 5  .  losartan (COZAAR) 25 MG tablet Take 1 tablet (25 mg total) by mouth at bedtime. 90 tablet 1  . Omega-3 Fatty Acids (OMEGA III EPA+DHA PO) Take 1 capsule by mouth in the morning and at bedtime.    . SUMAtriptan (IMITREX) 50 MG tablet Take 1 tablet (50 mg total) by mouth every 2 (two) hours as needed for migraine. May repeat in 2 hours if headache persists or recurs. 10 tablet 3  . Tiotropium Bromide-Olodaterol (STIOLTO RESPIMAT) 2.5-2.5 MCG/ACT AERS Inhale 2 puffs into the lungs daily. 2 Inhaler 3  . venlafaxine XR (EFFEXOR-XR) 37.5 MG 24 hr capsule TAKE 1 CAPSULE BY MOUTH ONCE DAILY WITH  BREAKFAST 30 capsule 3   No current facility-administered medications on file prior to  visit.    Allergies  Allergen Reactions  . Penicillins Anaphylaxis  . Demerol [Meperidine] Nausea And Vomiting  . Doxycycline Hyclate Nausea And Vomiting  . Gabapentin Other (See Comments)    Malaise  . Sulfa Antibiotics Hives  . Atorvastatin     myalgias  . Fluvastatin     myalgia  . Levaquin [Levofloxacin]     Reported some possible facial swelling with administration 05/2018  . Lovastatin     myalgia  . Pitavastatin     myalgia  . Pravastatin     myalgia  . Rosuvastatin     myalgia  . Simvastatin     myalgia  . Benadryl [Diphenhydramine Hcl] Palpitations and Rash  . Buprenorphine Hcl Nausea And Vomiting  . Cephalexin Rash  . Morphine And Related Nausea And Vomiting  . Spiriva Handihaler [Tiotropium Bromide Monohydrate] Rash    Hyperlipidemia LDL remains above goal for secondary prevention. Patient is working on decreasing modifiable risk fectors, like controlling blood pressure and decreasing amount of cigarettes per day. Noted failed trail with 5 different statins and ezetimibe. She continues to take omega-3 fatty acids every day.  PCSk9i therapy inducing: MOA, administration,storage, common side effects, monitoring, and prior-authorization process. Will start paperwork for Repatha SureClick 140mg  every 14 days, and follow up as needed. Plan to repeat fasting blood work after 4th dose of new medication.   Diana [name] PharmD, BCPS, [place] [id number] 09/19/2020 4:40 PM

## 2020-09-12 DIAGNOSIS — M9905 Segmental and somatic dysfunction of pelvic region: Secondary | ICD-10-CM | POA: Diagnosis not present

## 2020-09-12 DIAGNOSIS — M9903 Segmental and somatic dysfunction of lumbar region: Secondary | ICD-10-CM | POA: Diagnosis not present

## 2020-09-12 DIAGNOSIS — M9904 Segmental and somatic dysfunction of sacral region: Secondary | ICD-10-CM | POA: Diagnosis not present

## 2020-09-12 DIAGNOSIS — M9902 Segmental and somatic dysfunction of thoracic region: Secondary | ICD-10-CM | POA: Diagnosis not present

## 2020-09-17 ENCOUNTER — Ambulatory Visit (HOSPITAL_BASED_OUTPATIENT_CLINIC_OR_DEPARTMENT_OTHER)
Admission: RE | Admit: 2020-09-17 | Discharge: 2020-09-17 | Disposition: A | Discharge status: Home / Self Care | Payer: Medicaid Other | Source: Ambulatory Visit | Attending: Family Medicine | Admitting: Family Medicine

## 2020-09-17 ENCOUNTER — Other Ambulatory Visit: Payer: Self-pay

## 2020-09-17 DIAGNOSIS — Z87891 Personal history of nicotine dependence: Secondary | ICD-10-CM | POA: Diagnosis not present

## 2020-09-19 ENCOUNTER — Telehealth: Payer: Self-pay

## 2020-09-19 ENCOUNTER — Other Ambulatory Visit: Payer: Self-pay

## 2020-09-19 ENCOUNTER — Encounter: Payer: Self-pay | Admitting: Family Medicine

## 2020-09-19 ENCOUNTER — Encounter: Payer: Self-pay | Admitting: Pharmacist

## 2020-09-19 DIAGNOSIS — E7801 Familial hypercholesterolemia: Secondary | ICD-10-CM

## 2020-09-19 DIAGNOSIS — R911 Solitary pulmonary nodule: Secondary | ICD-10-CM | POA: Insufficient documentation

## 2020-09-19 MED ORDER — REPATHA SURECLICK 140 MG/ML ~~LOC~~ SOAJ: 140.0000 mg | SUBCUTANEOUS | 11 refills | Status: DC

## 2020-09-19 NOTE — Telephone Encounter (Signed)
Received fax confirmation that patients PA for Repatha has been approved for a quantity/billable units of 2 approved for 09-12-20 to 09-12-21.  Pharmacy Prior Authorization Request Number: [id number]

## 2020-09-19 NOTE — Telephone Encounter (Signed)
lmomed the pt that she was approved for repatha, rx sent, instructed the pt to call back if they have any questions

## 2020-09-19 NOTE — Assessment & Plan Note (Addendum)
LDL remains above goal for secondary prevention. Patient is working on decreasing modifiable risk fectors, like controlling blood pressure and decreasing amount of cigarettes per day. Noted failed trail with 5 different statins and ezetimibe. She continues to take omega-3 fatty acids every day.  PCSk9i therapy inducing: MOA, administration,storage, common side effects, monitoring, and prior-authorization process. Will start paperwork for Repatha SureClick 140mg  every 14 days, and follow up as needed. Plan to repeat fasting blood work after 4th dose of new medication.

## 2020-09-20 NOTE — Telephone Encounter (Signed)
Thanks for the update. Rx sent and patient informed.

## 2020-09-21 DIAGNOSIS — M9905 Segmental and somatic dysfunction of pelvic region: Secondary | ICD-10-CM | POA: Diagnosis not present

## 2020-09-21 DIAGNOSIS — M9902 Segmental and somatic dysfunction of thoracic region: Secondary | ICD-10-CM | POA: Diagnosis not present

## 2020-09-21 DIAGNOSIS — M9903 Segmental and somatic dysfunction of lumbar region: Secondary | ICD-10-CM | POA: Diagnosis not present

## 2020-09-21 DIAGNOSIS — M9904 Segmental and somatic dysfunction of sacral region: Secondary | ICD-10-CM | POA: Diagnosis not present

## 2020-09-26 NOTE — Progress Notes (Signed)
    SUBJECTIVE:   CHIEF COMPLAINT / HPI:   HTN Discontinued patient's carvedilol at last visit due to history of second-degree AV block, Mobitz type II Current regimen: Losartan 25 mg, amlodipine 2.5 mg Has only been taking norvasc because pharmacy didn't have losartan for her to take BP at home has been high No CP, SOB, changes in vision  Right Great Toe Patient reports that she stubbed her great toe a few weeks ago and has been having pain in her toe since then Reports that it is so tender that she is unable to wear shoes She is concerned that she has a fracture in her toe and would like an x-ray States that the pain is not similar to her gout pain  PERTINENT  PMH / PSH: Hypertension, history of Mobitz type II heart block, COPD, IBS, CKD, HLD, anxiety  OBJECTIVE:   BP (!) 158/84   Pulse 84   Wt 138 lb (62.6 kg)   SpO2 98%   BMI 26.95 kg/m    Physical Exam:  General: 64 y.o. female in NAD Cardio: 2+ dorsalis pedis pulses bilaterally Lungs: Breathing comfortably on room air Skin: warm and dry Extremities: Right great toe with tenderness to palpation of distal phalanx, no obvious deformity noted aside from lack of toenail which she states has been missing since age 24, full ROM of right great toe, pain with ROM    ASSESSMENT/PLAN:   Hypertension, essential, benign BP is not currently at goal, she has only been taking amlodipine 2.5 mg daily.  Sent losartan to the pharmacy again, will start at 25 mg and titrate up as needed.  She is to take this for a month and will follow-up in 1 month.  We will perform BMP at that time.  Would continue to avoid beta-blockers given her history of Mobitz type II heart block.  Great toe pain, right Given concerning history and examination will obtain x-rays.  If fractured, patient will need possible referral depending on displacement and intra-articular involvement.  May also need casting/boot.  Can ice for now, we will follow-up x-rays.       Cleophas Dunker, [place]

## 2020-09-27 ENCOUNTER — Ambulatory Visit: Payer: Medicaid Other | Admitting: Family Medicine

## 2020-09-27 ENCOUNTER — Other Ambulatory Visit: Payer: Self-pay

## 2020-09-27 ENCOUNTER — Ambulatory Visit
Admission: RE | Admit: 2020-09-27 | Discharge: 2020-09-27 | Disposition: A | Discharge status: Home / Self Care | Payer: Medicaid Other | Source: Ambulatory Visit | Attending: Family Medicine | Admitting: Family Medicine

## 2020-09-27 ENCOUNTER — Encounter: Payer: Self-pay | Admitting: Family Medicine

## 2020-09-27 VITALS — BP 158/84 | HR 84 | Wt 138.0 lb

## 2020-09-27 DIAGNOSIS — M79674 Pain in right toe(s): Secondary | ICD-10-CM

## 2020-09-27 DIAGNOSIS — S99921A Unspecified injury of right foot, initial encounter: Secondary | ICD-10-CM | POA: Diagnosis not present

## 2020-09-27 DIAGNOSIS — I10 Essential (primary) hypertension: Secondary | ICD-10-CM | POA: Diagnosis not present

## 2020-09-27 MED ORDER — LOSARTAN POTASSIUM 25 MG PO TABS: 25.0000 mg | ORAL_TABLET | Freq: Every day | ORAL | 1 refills | Status: DC

## 2020-09-27 MED ORDER — STIOLTO RESPIMAT 2.5-2.5 MCG/ACT IN AERS: 2.0000 | INHALATION_SPRAY | Freq: Every day | RESPIRATORY_TRACT | 3 refills | Status: DC

## 2020-09-27 NOTE — Assessment & Plan Note (Signed)
Given concerning history and examination will obtain x-rays.  If fractured, patient will need possible referral depending on displacement and intra-articular involvement.  May also need casting/boot.  Can ice for now, we will follow-up x-rays.

## 2020-09-27 NOTE — Patient Instructions (Signed)
Thank you for coming to see me today. It was a pleasure. Today we talked about:   Go to [facility] to [place] for x-rays of your toe.  Ice it and try to buddy tape to see if that will help.  For your blood pressure you should be taking losartan 25mg  and amlodipine 2.5mg  daily.  Please follow-up with me in  1 month.  If you have any questions or concerns, please do not hesitate to call the office at [phone].  Best,   [place] Constable, DO

## 2020-09-27 NOTE — Assessment & Plan Note (Addendum)
BP is not currently at goal, she has only been taking amlodipine 2.5 mg daily.  Sent losartan to the pharmacy again, will start at 25 mg and titrate up as needed.  She is to take this for a month and will follow-up in 1 month.  We will perform BMP at that time.  Would continue to avoid beta-blockers given her history of Mobitz type II heart block.

## 2020-09-28 DIAGNOSIS — M9904 Segmental and somatic dysfunction of sacral region: Secondary | ICD-10-CM | POA: Diagnosis not present

## 2020-09-28 DIAGNOSIS — M9903 Segmental and somatic dysfunction of lumbar region: Secondary | ICD-10-CM | POA: Diagnosis not present

## 2020-09-28 DIAGNOSIS — M9905 Segmental and somatic dysfunction of pelvic region: Secondary | ICD-10-CM | POA: Diagnosis not present

## 2020-09-28 DIAGNOSIS — M9902 Segmental and somatic dysfunction of thoracic region: Secondary | ICD-10-CM | POA: Diagnosis not present

## 2020-09-30 ENCOUNTER — Emergency Department (HOSPITAL_BASED_OUTPATIENT_CLINIC_OR_DEPARTMENT_OTHER)
Admission: EM | Admit: 2020-09-30 | Discharge: 2020-09-30 | Disposition: A | Discharge status: Home / Self Care | Payer: Medicaid Other | Attending: Emergency Medicine | Admitting: Emergency Medicine

## 2020-09-30 ENCOUNTER — Encounter (HOSPITAL_BASED_OUTPATIENT_CLINIC_OR_DEPARTMENT_OTHER): Payer: Self-pay | Admitting: *Deleted

## 2020-09-30 ENCOUNTER — Emergency Department (HOSPITAL_BASED_OUTPATIENT_CLINIC_OR_DEPARTMENT_OTHER): Payer: Medicaid Other

## 2020-09-30 ENCOUNTER — Other Ambulatory Visit: Payer: Self-pay

## 2020-09-30 DIAGNOSIS — Y9289 Other specified places as the place of occurrence of the external cause: Secondary | ICD-10-CM | POA: Diagnosis not present

## 2020-09-30 DIAGNOSIS — Z79899 Other long term (current) drug therapy: Secondary | ICD-10-CM | POA: Diagnosis not present

## 2020-09-30 DIAGNOSIS — M952 Other acquired deformity of head: Secondary | ICD-10-CM | POA: Diagnosis not present

## 2020-09-30 DIAGNOSIS — S0990XA Unspecified injury of head, initial encounter: Secondary | ICD-10-CM | POA: Diagnosis not present

## 2020-09-30 DIAGNOSIS — Z96641 Presence of right artificial hip joint: Secondary | ICD-10-CM | POA: Diagnosis not present

## 2020-09-30 DIAGNOSIS — J45909 Unspecified asthma, uncomplicated: Secondary | ICD-10-CM | POA: Insufficient documentation

## 2020-09-30 DIAGNOSIS — I129 Hypertensive chronic kidney disease with stage 1 through stage 4 chronic kidney disease, or unspecified chronic kidney disease: Secondary | ICD-10-CM | POA: Diagnosis not present

## 2020-09-30 DIAGNOSIS — N182 Chronic kidney disease, stage 2 (mild): Secondary | ICD-10-CM | POA: Insufficient documentation

## 2020-09-30 DIAGNOSIS — W01198A Fall on same level from slipping, tripping and stumbling with subsequent striking against other object, initial encounter: Secondary | ICD-10-CM | POA: Diagnosis not present

## 2020-09-30 DIAGNOSIS — Z7982 Long term (current) use of aspirin: Secondary | ICD-10-CM | POA: Diagnosis not present

## 2020-09-30 DIAGNOSIS — F17210 Nicotine dependence, cigarettes, uncomplicated: Secondary | ICD-10-CM | POA: Diagnosis not present

## 2020-09-30 DIAGNOSIS — S0181XA Laceration without foreign body of other part of head, initial encounter: Secondary | ICD-10-CM | POA: Diagnosis not present

## 2020-09-30 NOTE — ED Provider Notes (Signed)
[facility] Provider Note   CSN: [id number] Arrival date & time: 09/30/20  2127     History Chief Complaint  Patient presents with  . Fall    Diana [name] is a 64 y.o. female.  Patient tripped and fell and hit her forehead.  She tripped over a light on the floor.  Has had some alcohol tonight.  Denies any pain elsewhere.  No neck pain.  No loss of consciousness.  Not on blood thinners.  The history is provided by the patient.  Fall This is a new problem. The current episode started less than 1 hour ago. The problem has not changed since onset.Associated symptoms include headaches. Pertinent negatives include no chest pain, no abdominal pain and no shortness of breath. Nothing aggravates the symptoms. Nothing relieves the symptoms. She has tried nothing for the symptoms. The treatment provided no relief.       Past Medical History:  Diagnosis Date  . Anxiety   . Asthma   . Barrett esophagus    "don't know that I've ever been stretched" (08/17/2013)  . Choledocholithiasis with acute cholecystitis   . Chronic lower back pain   . COPD (chronic obstructive pulmonary disease) ([place])   . Craniosynostosis    congenital  . DDD (degenerative disc disease)   . Family history of anesthesia complication    "PONV; both parents" (08/17/2013)  . GERD (gastroesophageal reflux disease)   . H/O hiatal hernia   . History of blood transfusion 1959-1960  . Hollenhorst plaque    Left eye  . Hypercholesterolemia 05/28/2012   Has taken Crestor and Lipitor in the past. Switched to lovastatin b/c its $4.    . Hypertension   . IBS (irritable bowel syndrome)   . Migraines    "since age 40; probably twice/month" (08/17/2013)  . PONV (postoperative nausea and vomiting)   . Rheumatoid arthritis ([place])    "in my knuckles" (08/17/2013)  . Ruptured lumbar disc 1990's   L4-L5  . Shingles     Patient Active Problem List   Diagnosis Date Noted  . Great toe pain, right  09/27/2020  . Lung nodule 09/19/2020  . Hyponatremia 08/28/2020  . Takes dietary supplements 07/28/2020  . Bilateral leg edema 07/16/2020  . Encounter for screening mammogram for malignant neoplasm of breast 04/20/2020  . Sleep apnea-like behavior 03/20/2020  . Second degree AV block 02/10/2020  . Sinus pause 02/10/2020  . Dizziness 01/15/2020  . Dry eye syndrome of both eyes 01/15/2020  . Hollenhorst plaque, left eye 01/13/2020  . Acute gout involving toe of left foot 10/12/2019  . Craniosynostosis 02/07/2019  . Memory change 02/07/2019  . Rectal bleeding 10/22/2018  . Jaw claudication 09/06/2018  . Tobacco abuse 12/08/2017  . Kidney stones 07/22/2017  . Fever 03/13/2017  . Right leg pain 03/04/2017  . Chronic obstructive pulmonary disease ([place]) 03/04/2017  . H/O: hysterectomy 09/22/2014  . Postmenopausal atrophic vaginitis 09/22/2014  . History of right hip replacement 07/24/2014  . Postmenopausal bone loss 05/29/2014  . Migraine aura, persistent, intractable 05/24/2014  . 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 Atrophic vaginitis 08/28/2012  . Irritable bowel syndrome (IBS) 06/07/2012  . Restless leg syndrome 05/28/2012  . Anxiety 05/28/2012  . Hypertension, essential, benign 05/28/2012  . Hyperlipidemia 05/28/2012  . Barrett's esophagus 05/20/2012    Past Surgical History:  Procedure Laterality Date  . ABDOMINAL HYSTERECTOMY  1992   Total for chronic pelvic pain   . APPENDECTOMY    .  BACK SURGERY  1990's   L4-L5 fusion @ [facility]  05/28/2019   fused C4-5-6, done in [place]  . CHOLECYSTECTOMY  2007   Performed in [place]   . COLONOSCOPY    . CRANIECTOMY FOR CRANIOSYNOSTOSIS  [id number]   "1st time got infected; 2nd time didn't take; fix the 3rd time" (08/17/2013)  . POSTERIOR LUMBAR FUSION  1990's   "took piece off my hip" (08/17/2013)  . TOTAL HIP ARTHROPLASTY Right 12/31/04   AVN after fall injury;  performed by Ralene Cork; DePuy S-ROM total hip (metal on metal)  . UPPER GASTROINTESTINAL ENDOSCOPY       OB History   No obstetric history on file.     Family History  Problem Relation Age of Onset  . Hypertension Mother   . Hypertension Father   . Skin cancer Father   . Colon cancer Paternal Grandfather   . Rectal cancer Neg Hx   . Stomach cancer Neg Hx   . Esophageal cancer Neg Hx     Social History   Tobacco Use  . Smoking status: Current Every Day Smoker    Packs/day: 0.50    Years: 32.00    Pack years: 16.00    Types: Cigarettes  . Smokeless tobacco: Never Used  . Tobacco comment: on chanix  Vaping Use  . Vaping Use: Never used  Substance Use Topics  . Alcohol use: Yes    Alcohol/week: 0.0 standard drinks    Comment: occasionally  . Drug use: No    Home Medications Prior to Admission medications   Medication Sig Start Date End Date Taking? Authorizing Provider  albuterol (PROVENTIL HFA;VENTOLIN HFA) 108 (90 Base) MCG/ACT inhaler Inhale 2 puffs into the lungs every 6 (six) hours as needed for wheezing. 08/03/18   Glenis Smoker, MD  allopurinol (ZYLOPRIM) 100 MG tablet Take 1 tablet (100 mg total) by mouth daily. 05/24/20   Meccariello, Bernita Raisin, DO  amLODipine (NORVASC) 2.5 MG tablet Take 1 tablet (2.5 mg total) by mouth daily with supper. 07/31/20   Leavy Cella, RPH-CPP  aspirin-acetaminophen-caffeine (EXCEDRIN MIGRAINE) [phone] MG tablet Take 2 tablets by mouth every 6 (six) hours as needed for headache.    [provider]  diclofenac sodium (VOLTAREN) 1 % GEL Apply 2 g topically 4 (four) times daily as needed. 10/25/18   Glenis Smoker, MD  esomeprazole (NEXIUM) 40 MG capsule Take 1 capsule (40 mg total) by mouth daily at 12 noon. 05/24/20   Meccariello, Bernita Raisin, DO  Evolocumab (REPATHA SURECLICK) 562 MG/ML SOAJ Inject 140 mg into the skin every 14 (fourteen) days. 09/19/20   [name], Godfrey Pick, DO  linaclotide (LINZESS) 145 MCG CAPS  capsule Take 1 capsule (145 mcg total) by mouth daily before breakfast. 03/20/20   Meccariello, Bernita Raisin, DO  losartan (COZAAR) 25 MG tablet Take 1 tablet (25 mg total) by mouth at bedtime. 09/27/20   Meccariello, Bernita Raisin, DO  Omega-3 Fatty Acids (OMEGA III EPA+DHA PO) Take 1 capsule by mouth in the morning and at bedtime.    [provider]  SUMAtriptan (IMITREX) 50 MG tablet Take 1 tablet (50 mg total) by mouth every 2 (two) hours as needed for migraine. May repeat in 2 hours if headache persists or recurs. 04/20/20   Meccariello, Bernita Raisin, DO  Tiotropium Bromide-Olodaterol (STIOLTO RESPIMAT) 2.5-2.5 MCG/ACT AERS Inhale 2 puffs into the lungs daily. 09/27/20   Meccariello, Bernita Raisin, DO  venlafaxine XR (EFFEXOR-XR)  37.5 MG 24 hr capsule TAKE 1 CAPSULE BY MOUTH ONCE DAILY WITH  BREAKFAST 09/07/20   Meccariello, Bernita Raisin, DO    Allergies    Penicillins, Demerol [meperidine], Doxycycline hyclate, Gabapentin, Sulfa antibiotics, Atorvastatin, Fluvastatin, Levaquin [levofloxacin], Lovastatin, Pitavastatin, Pravastatin, Rosuvastatin, Simvastatin, Benadryl [diphenhydramine hcl], Buprenorphine hcl, Cephalexin, Morphine and related, and Spiriva handihaler [tiotropium bromide monohydrate]  Review of Systems   Review of Systems  Constitutional: Negative for chills and fever.  HENT: Negative for ear pain and sore throat.   Eyes: Negative for pain and visual disturbance.  Respiratory: Negative for cough and shortness of breath.   Cardiovascular: Negative for chest pain and palpitations.  Gastrointestinal: Negative for abdominal pain and vomiting.  Genitourinary: Negative for dysuria and hematuria.  Musculoskeletal: Negative for arthralgias and back pain.  Skin: Positive for wound. Negative for color change and rash.  Neurological: Positive for headaches. Negative for seizures and syncope.  All other systems reviewed and are negative.   Physical Exam Updated Vital Signs  ED Triage Vitals  Enc  Vitals Group     BP 09/30/20 2138 138/73     Pulse Rate 09/30/20 2138 81     Resp 09/30/20 2138 18     Temp 09/30/20 2138 98.1 F (36.7 C)     Temp Source 09/30/20 2138 Oral     SpO2 09/30/20 2138 100 %     Weight 09/30/20 2136 137 lb 15.8 oz (62.6 kg)     Height 09/30/20 2136 5' (1.524 m)     Head Circumference --      Peak Flow --      Pain Score 09/30/20 2136 8     Pain Loc --      Pain Edu? --      Excl. in [place]? --     Physical Exam Vitals and nursing note reviewed.  Constitutional:      General: She is not in acute distress.    Appearance: She is well-developed. She is not ill-appearing.  HENT:     Head: Normocephalic and atraumatic.     Nose: Nose normal.     Mouth/Throat:     Mouth: Mucous membranes are moist.  Eyes:     Extraocular Movements: Extraocular movements intact.     Conjunctiva/sclera: Conjunctivae normal.     Pupils: Pupils are equal, round, and reactive to light.  Cardiovascular:     Rate and Rhythm: Normal rate and regular rhythm.     Heart sounds: No murmur heard.   Pulmonary:     Effort: Pulmonary effort is normal. No respiratory distress.     Breath sounds: Normal breath sounds.  Abdominal:     Palpations: Abdomen is soft.     Tenderness: There is no abdominal tenderness.  Musculoskeletal:        General: No tenderness. Normal range of motion.     Cervical back: Normal range of motion and neck supple. No tenderness.  Skin:    General: Skin is warm and dry.     Comments: Laceration to the forehead superficial and hemostatic around 4 cm  Neurological:     General: No focal deficit present.     Mental Status: She is alert and oriented to person, place, and time.     Cranial Nerves: No cranial nerve deficit.     Sensory: No sensory deficit.     Motor: No weakness.     Coordination: Coordination normal.     Comments: 5+ out of 5 strength throughout, normal  sensation, no drift     ED Results / Procedures / Treatments   Labs (all labs  ordered are listed, but only abnormal results are displayed) Labs Reviewed - No data to display  EKG None  Radiology CT Head Wo Contrast  Result Date: 09/30/2020 CLINICAL DATA:  Fall, hit forehead EXAM: CT HEAD WITHOUT CONTRAST TECHNIQUE: Contiguous axial images were obtained from the base of the skull through the vertex without intravenous contrast. COMPARISON:  09/13/2019 FINDINGS: Brain: No acute intracranial abnormality. Specifically, no hemorrhage, hydrocephalus, mass lesion, acute infarction, or significant intracranial injury. Vascular: No hyperdense vessel or unexpected calcification. Skull: No acute calvarial abnormality. Chronic calvarial deformity is stable. Sinuses/Orbits: Visualized paranasal sinuses and mastoids clear. Orbital soft tissues unremarkable. Other: None IMPRESSION: No acute intracranial abnormality. Electronically Signed   By: Rolm Baptise M.D.   On: 09/30/2020 22:50    Procedures .Marland KitchenLaceration Repair  Date/Time: 09/30/2020 11:16 PM Performed by: Lennice Sites, DO Authorized by: Lennice Sites, DO   Consent:    Consent obtained:  Verbal   Consent given by:  Patient   Risks discussed:  Need for additional repair, nerve damage, poor cosmetic result, poor wound healing, infection, pain, retained foreign body, tendon damage and vascular damage   Alternatives discussed:  No treatment Anesthesia (see MAR for exact dosages):    Anesthesia method:  None Laceration details:    Location:  Face   Face location:  Forehead   Length (cm):  4   Depth (mm):  1 Repair type:    Repair type:  Simple Exploration:    Wound exploration: wound explored through full range of motion and entire depth of wound probed and visualized     Wound extent: no areolar tissue violation noted, no fascia violation noted, no foreign bodies/material noted, no muscle damage noted, no nerve damage noted, no tendon damage noted, no underlying fracture noted and no vascular damage noted      Contaminated: no   Treatment:    Area cleansed with:  Saline   Amount of cleaning:  Standard   Irrigation solution:  Sterile saline   Irrigation volume:  500   Irrigation method:  Syringe   Visualized foreign bodies/material removed: no   Skin repair:    Repair method:  Steri-Strips and tissue adhesive   Number of Steri-Strips:  10 Approximation:    Approximation:  Close Post-procedure details:    Dressing:  Open (no dressing)   Patient tolerance of procedure:  Tolerated well, no immediate complications   (including critical care time)  Medications Ordered in ED Medications - No data to display  ED Course  I have reviewed the triage vital signs and the nursing notes.  Pertinent labs & imaging results that were available during my care of the patient were reviewed by me and considered in my medical decision making (see chart for details).    MDM Rules/Calculators/A&P                          ZURII HEWES is a 64 year old female no significant medical history presents the ED with laceration.  Normal vitals.  No fever.  Mechanical fall patient tripped and hit her head.  Laceration to her forehead that is hemostatic.  Superficial.  CT scan of the head was performed due to some alcohol intoxication and headache that was unremarkable.  No midline spinal pain.  No pain elsewhere.  Laceration is repaired with Dermabond and wound care instructions were  given.  Patient states tetanus shot is up-to-date.  Discharged in good condition.  Wound care instructions provided.  This chart was dictated using voice recognition software.  Despite best efforts to proofread,  errors can occur which can change the documentation meaning.    Final Clinical Impression(s) / ED Diagnoses Final diagnoses:  Facial laceration, initial encounter    Rx / DC Orders ED Discharge Orders    None       Lennice Sites, DO 09/30/20 2317

## 2020-09-30 NOTE — Discharge Instructions (Addendum)
Head CT is normal.  Keep your laceration site clean and dry for the next 24 to 48 hours.  Steri-Strips will fall off on their own.  It is okay to use bacitracin or Neosporin on your wound as it heals over time.

## 2020-09-30 NOTE — ED Notes (Signed)
Laceration noted at forehead, irregular in shape, bleeding controlled, no swelling or discoloration noted at the injury  site at this time

## 2020-09-30 NOTE — ED Notes (Signed)
Comfort measures provided at this time

## 2020-09-30 NOTE — ED Notes (Signed)
States fell over a landscape light and fell onto gravel, hitting head, denies any LOC, no blood thinners, denies having chest pain, dizziness prior to fall, denies any alcohol intake this PM as well

## 2020-09-30 NOTE — ED Notes (Signed)
AVS reviewed with client, discussed post laceration repair care with Derma Bond and Steri-Strips applied by EDP. Opportunity for questions provided. Pt escorted to exit, pts gait noted to be very stable. Copy of AVS provided to client

## 2020-09-30 NOTE — ED Triage Notes (Signed)
Pt states she was at a murder-mystery dinner and fell and hit her head. Lac to mid forehead. Bleeding controlled

## 2020-10-07 DIAGNOSIS — M79672 Pain in left foot: Secondary | ICD-10-CM | POA: Diagnosis not present

## 2020-10-07 DIAGNOSIS — S93602A Unspecified sprain of left foot, initial encounter: Secondary | ICD-10-CM | POA: Diagnosis not present

## 2020-10-07 DIAGNOSIS — M7989 Other specified soft tissue disorders: Secondary | ICD-10-CM | POA: Diagnosis not present

## 2020-10-07 DIAGNOSIS — S93402A Sprain of unspecified ligament of left ankle, initial encounter: Secondary | ICD-10-CM | POA: Diagnosis not present

## 2020-10-15 ENCOUNTER — Other Ambulatory Visit: Payer: Self-pay | Admitting: *Deleted

## 2020-10-15 DIAGNOSIS — K581 Irritable bowel syndrome with constipation: Secondary | ICD-10-CM

## 2020-10-16 MED ORDER — LINACLOTIDE 145 MCG PO CAPS: 145.0000 ug | ORAL_CAPSULE | Freq: Every day | ORAL | 5 refills | Status: DC

## 2020-10-24 ENCOUNTER — Ambulatory Visit: Payer: Medicaid Other | Admitting: Sports Medicine

## 2020-10-24 ENCOUNTER — Other Ambulatory Visit: Payer: Self-pay

## 2020-10-24 ENCOUNTER — Encounter: Payer: Self-pay | Admitting: Sports Medicine

## 2020-10-24 DIAGNOSIS — M792 Neuralgia and neuritis, unspecified: Secondary | ICD-10-CM

## 2020-10-24 DIAGNOSIS — S93402A Sprain of unspecified ligament of left ankle, initial encounter: Secondary | ICD-10-CM | POA: Diagnosis not present

## 2020-10-24 DIAGNOSIS — M79672 Pain in left foot: Secondary | ICD-10-CM

## 2020-10-24 DIAGNOSIS — S93602A Unspecified sprain of left foot, initial encounter: Secondary | ICD-10-CM

## 2020-10-24 DIAGNOSIS — S9002XA Contusion of left ankle, initial encounter: Secondary | ICD-10-CM | POA: Diagnosis not present

## 2020-10-24 DIAGNOSIS — R609 Edema, unspecified: Secondary | ICD-10-CM

## 2020-10-24 MED ORDER — PREDNISONE 10 MG (21) PO TBPK: ORAL_TABLET | ORAL | 0 refills | Status: DC

## 2020-10-24 NOTE — Progress Notes (Signed)
Subjective:  Diana [name] is a 64 y.o. female patient who presents to office for evaluation of left foot and ankle pain. Patient complains of continued pain in the foot and ankle since a sprain injury that happened on 10/07/20.  Patient reports that she went to the ER and had x-rays and was told that she likely had a sprain injury and was told to rest ice elevate and was given crutches to stay off the foot patient reports that she tried this for several weeks but the pain still has not gotten better reports she has sharp shooting pains to the foot and ankle and swelling at the end of the day that causes her to limp patient denies any recent injury.  Reports that sometimes the swelling is so bad that there is redness warmth and a shiny appearance to the foot.  Patient denies any other pedal complaints at this time.  Review of systems noncontributory   Patient Active Problem List   Diagnosis Date Noted  . Great toe pain, right 09/27/2020  . Lung nodule 09/19/2020  . Hyponatremia 08/28/2020  . Takes dietary supplements 07/28/2020  . Bilateral leg edema 07/16/2020  . Encounter for screening mammogram for malignant neoplasm of breast 04/20/2020  . Sleep apnea-like behavior 03/20/2020  . Second degree AV block 02/10/2020  . Sinus pause 02/10/2020  . Dizziness 01/15/2020  . Dry eye syndrome of both eyes 01/15/2020  . Hollenhorst plaque, left eye 01/13/2020  . Acute gout involving toe of left foot 10/12/2019  . Craniosynostosis 02/07/2019  . Memory change 02/07/2019  . Rectal bleeding 10/22/2018  . Jaw claudication 09/06/2018  . Tobacco abuse 12/08/2017  . Kidney stones 07/22/2017  . Fever 03/13/2017  . Right leg pain 03/04/2017  . Chronic obstructive pulmonary disease ([place]) 03/04/2017  . H/O: hysterectomy 09/22/2014  . Postmenopausal atrophic vaginitis 09/22/2014  . History of right hip replacement 07/24/2014  . Postmenopausal bone loss 05/29/2014  . Migraine aura, persistent, intractable  05/24/2014  . 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 Atrophic vaginitis 08/28/2012  . Irritable bowel syndrome (IBS) 06/07/2012  . Restless leg syndrome 05/28/2012  . Anxiety 05/28/2012  . Hypertension, essential, benign 05/28/2012  . Hyperlipidemia 05/28/2012  . Barrett's esophagus 05/20/2012    Current Outpatient Medications on File Prior to Visit  Medication Sig Dispense Refill  . aspirin 81 MG EC tablet 81 mg.    . Calcium Carbonate-Vitamin D 600-200 MG-UNIT TABS 1 tablet.    . clindamycin (CLEOCIN) 150 MG capsule 450 mg.    . ketorolac (TORADOL) 10 MG tablet 10 mg.    . oxyCODONE (OXY IR/ROXICODONE) 5 MG immediate release tablet 5 mg.    . Pitavastatin Calcium 1 MG TABS 1 mg.    . propranolol (INDERAL) 40 MG tablet 40 mg.    . albuterol (PROVENTIL HFA;VENTOLIN HFA) 108 (90 Base) MCG/ACT inhaler Inhale 2 puffs into the lungs every 6 (six) hours as needed for wheezing. 8.5 g 5  . allopurinol (ZYLOPRIM) 100 MG tablet Take 1 tablet (100 mg total) by mouth daily. 30 tablet 6  . amLODipine (NORVASC) 2.5 MG tablet Take 1 tablet (2.5 mg total) by mouth daily with supper. 90 tablet 3  . aspirin-acetaminophen-caffeine (EXCEDRIN MIGRAINE) 250-250-65 MG tablet Take 2 tablets by mouth every 6 (six) hours as needed for headache.    Marland Kitchen azithromycin (ZITHROMAX) 250 MG tablet Take 250 mg by mouth as directed.    . carvedilol (COREG) 6.25 MG tablet Take 6.25 mg  by mouth 2 (two) times daily.    . diclofenac sodium (VOLTAREN) 1 % GEL Apply 2 g topically 4 (four) times daily as needed. 100 g 5  . esomeprazole (NEXIUM) 40 MG capsule Take 1 capsule (40 mg total) by mouth daily at 12 noon. 90 capsule 1  . Evolocumab (REPATHA SURECLICK) 756 MG/ML SOAJ Inject 140 mg into the skin every 14 (fourteen) days. 2 mL 11  . linaclotide (LINZESS) 145 MCG CAPS capsule Take 1 capsule (145 mcg total) by mouth daily before breakfast. 30 capsule 5  . losartan (COZAAR) 25 MG tablet Take 1 tablet (25 mg total)  by mouth at bedtime. 90 tablet 1  . Omega-3 Fatty Acids (OMEGA III EPA+DHA PO) Take 1 capsule by mouth in the morning and at bedtime.    . SUMAtriptan (IMITREX) 50 MG tablet Take 1 tablet (50 mg total) by mouth every 2 (two) hours as needed for migraine. May repeat in 2 hours if headache persists or recurs. 10 tablet 3  . Tiotropium Bromide-Olodaterol (STIOLTO RESPIMAT) 2.5-2.5 MCG/ACT AERS Inhale 2 puffs into the lungs daily. 4 g 3  . venlafaxine XR (EFFEXOR-XR) 37.5 MG 24 hr capsule TAKE 1 CAPSULE BY MOUTH ONCE DAILY WITH  BREAKFAST 30 capsule 3   No current facility-administered medications on file prior to visit.    Allergies  Allergen Reactions  . Penicillins Anaphylaxis  . Demerol [Meperidine] Nausea And Vomiting  . Doxycycline Hyclate Nausea And Vomiting  . Gabapentin Other (See Comments)    Malaise  . Sulfa Antibiotics Hives  . Atorvastatin     myalgias  . Fluvastatin     myalgia  . Levaquin [Levofloxacin]     Reported some possible facial swelling with administration 05/2018  . Lovastatin     myalgia  . No Known Allergies     Other reaction(s): Dizziness  . Pitavastatin     myalgia  . Pravastatin     myalgia  . Rosuvastatin     myalgia  . Simvastatin     myalgia  . Benadryl [Diphenhydramine Hcl] Palpitations and Rash  . Buprenorphine Hcl Nausea And Vomiting  . Cephalexin Rash  . Morphine And Related Nausea And Vomiting  . Spiriva Handihaler [Tiotropium Bromide Monohydrate] Rash    Objective:  General: Alert and oriented x3 in no acute distress  Dermatology: No open lesions bilateral lower extremities, no webspace macerations, no ecchymosis bilateral, all nails x 10 are well manicured.  Vascular: Dorsalis Pedis and Posterior Tibial pedal pulses 1 out of 4 palpable, Capillary Fill Time 3 seconds,(+) scant pedal hair growth bilateral, trace edema on left.  Temperature gradient acceptable.  Neurology: Gross sensation intact via light touch bilateral.  Occasional  sharp shooting pain subjectively reported at the left foot and ankle.  Musculoskeletal: Mild to moderate tenderness with palpation at lateral ankle on the left and to dorsal midfoot.  Pain is worsened with plantarflexion and inversion of left foot.  Strength within normal limits in all groups bilateral with guarding on left due to pain.   Gait: Antalgic gait   Assessment and Plan: Problem List Items Addressed This Visit    None    Visit Diagnoses    Contusion of left ankle, initial encounter    -  Primary   Sprain of left ankle, unspecified ligament, initial encounter       Left foot pain       Foot sprain, left, initial encounter       Swelling  Neuritis           -Complete examination performed -Xrays reviewed -Discussed treatement options for ongoing sprain of left foot and ankle with swelling and possible neuritis -Rx prednisone dosepak for patient to take as instructed -May take Tylenol over-the-counter for additional breakthrough pain -Dispense Surgigrip compression sleeve to use for edema control if this is uncomfortable may use Ace wrap -Prescription given for [employer] for surgical boot or shoe -Advised patient to use her crutches if becomes more painful and to rest ice elevate as much as possible to help with left foot and ankle pain -Patient to return to office in 3 weeks or sooner if condition worsens.  If patient fails to continue to improve we will request MRI to evaluate for ligament or tendon tear.  Landis Martins, DPM

## 2020-10-29 ENCOUNTER — Encounter: Payer: Self-pay | Admitting: Family Medicine

## 2020-10-29 ENCOUNTER — Other Ambulatory Visit: Payer: Self-pay

## 2020-10-29 ENCOUNTER — Ambulatory Visit: Payer: Medicaid Other | Admitting: Family Medicine

## 2020-10-29 VITALS — BP 152/76 | HR 83 | Wt 137.0 lb

## 2020-10-29 DIAGNOSIS — Z1231 Encounter for screening mammogram for malignant neoplasm of breast: Secondary | ICD-10-CM

## 2020-10-29 DIAGNOSIS — I10 Essential (primary) hypertension: Secondary | ICD-10-CM | POA: Diagnosis not present

## 2020-10-29 MED ORDER — LOSARTAN POTASSIUM 50 MG PO TABS: 50.0000 mg | ORAL_TABLET | Freq: Every day | ORAL | 1 refills | Status: DC

## 2020-10-29 NOTE — Assessment & Plan Note (Signed)
BP remains elevated today, will go ahead and increase her losartan from 25 mg daily to 50 mg daily.  We will continue with amlodipine 2.5 mg.  Given that she had been off of losartan for some time, will go ahead and order a BMP for today.  We will have her follow-up in 1 month, will repeat BMP at that time.

## 2020-10-29 NOTE — Progress Notes (Signed)
    SUBJECTIVE:   CHIEF COMPLAINT / HPI:   HTN Current regimen: Losartan 25 mg daily, Norvasc 2.5 mg daily She had not been taking losartan due to pharmacy problems at last visit on 10/28 Has been taking medications BPs at home running middle 145 to low 150s Denies chest pain, shortness of breath, leg edema  Left ankle sprain Patient has been treated by podiatry for left ankle sprain She was started on prednisone by podiatrist She is doing well now Was told she had a very bad sprain   Needs mammogram, wants to go in [place]  PMH / [place]: Hypertension, history of Mobitz type II heart block, COPD, IBS, CKD, HLD, anxiety  OBJECTIVE:   BP (!) 152/76   Pulse 83   Wt 137 lb (62.1 kg)   SpO2 98%   BMI 26.76 kg/m    Physical Exam:  General: 64 y.o. female in NAD Cardio: RRR no m/r/g Lungs: CTAB, no wheezing, no rhonchi, no crackles, no IWOB on RA Skin: warm and dry Extremities: No edema, left foot in walking boot   ASSESSMENT/PLAN:   Hypertension, essential, benign BP remains elevated today, will go ahead and increase her losartan from 25 mg daily to 50 mg daily.  We will continue with amlodipine 2.5 mg.  Given that she had been off of losartan for some time, will go ahead and order a BMP for today.  We will have her follow-up in 1 month, will repeat BMP at that time.   Left ankle sprain Currently on prednisone prescribed by podiatrist that she also might have had some neuritis component.  She will follow up with podiatry as needed.  Reordered mammogram and advised patient to call the hospital and asked her to get this scheduled.  Cleophas Dunker, [place]

## 2020-10-29 NOTE — Patient Instructions (Signed)
Thank you for coming to see me today. It was a pleasure. Today we talked about:   We will get some labs today.  If they are abnormal or we need to do something about them, I will call you.  If they are normal, I will send you a message on MyChart (if it is active) or a letter in the mail.  If you don't hear from [place] in 2 weeks, please call the office at the number below.  I have placed an order for your mammogram.  Please call to schedule this.  We will increase your Losartan to 50mg  daily.  Please follow-up with me in 1 month.  If you have any questions or concerns, please do not hesitate to call the office at [phone].  Best,   [place] Constable, DO

## 2020-10-30 LAB — BASIC METABOLIC PANEL
BUN/Creatinine Ratio: 11 — ABNORMAL LOW (ref 12–28)
BUN: 8 mg/dL (ref 8–27)
CO2: 27 mmol/L (ref 20–29)
Calcium: 9.5 mg/dL (ref 8.7–10.3)
Chloride: 95 mmol/L — ABNORMAL LOW (ref 96–106)
Creatinine, Ser: 0.75 mg/dL (ref 0.57–1.00)
GFR calc Af Amer: 99 mL/min/{1.73_m2} (ref >59)
GFR calc non Af Amer: 86 mL/min/{1.73_m2} (ref >59)
Glucose: 83 mg/dL (ref 65–99)
Potassium: 3.9 mmol/L (ref 3.5–5.2)
Sodium: 136 mmol/L (ref 134–144)

## 2020-11-03 ENCOUNTER — Other Ambulatory Visit: Payer: Self-pay | Admitting: Family Medicine

## 2020-11-13 ENCOUNTER — Ambulatory Visit: Payer: Medicaid Other | Admitting: Sports Medicine

## 2020-11-15 DIAGNOSIS — E7801 Familial hypercholesterolemia: Secondary | ICD-10-CM | POA: Diagnosis not present

## 2020-11-15 LAB — LIPID PANEL
Chol/HDL Ratio: 2.7 ratio (ref 0.0–4.4)
Cholesterol, Total: 184 mg/dL (ref 100–199)
HDL: 69 mg/dL (ref >39)
LDL Chol Calc (NIH): 98 mg/dL (ref 0–99)
Triglycerides: 97 mg/dL (ref 0–149)
VLDL Cholesterol Cal: 17 mg/dL (ref 5–40)

## 2020-11-21 ENCOUNTER — Other Ambulatory Visit: Payer: Self-pay

## 2020-11-21 DIAGNOSIS — E7801 Familial hypercholesterolemia: Secondary | ICD-10-CM

## 2020-11-30 DIAGNOSIS — M9903 Segmental and somatic dysfunction of lumbar region: Secondary | ICD-10-CM | POA: Diagnosis not present

## 2020-11-30 DIAGNOSIS — M9902 Segmental and somatic dysfunction of thoracic region: Secondary | ICD-10-CM | POA: Diagnosis not present

## 2020-11-30 DIAGNOSIS — M9904 Segmental and somatic dysfunction of sacral region: Secondary | ICD-10-CM | POA: Diagnosis not present

## 2020-11-30 DIAGNOSIS — M9905 Segmental and somatic dysfunction of pelvic region: Secondary | ICD-10-CM | POA: Diagnosis not present

## 2020-12-05 ENCOUNTER — Ambulatory Visit: Payer: Medicaid Other | Admitting: Family Medicine

## 2020-12-05 ENCOUNTER — Other Ambulatory Visit: Payer: Self-pay

## 2020-12-05 ENCOUNTER — Encounter: Payer: Self-pay | Admitting: Family Medicine

## 2020-12-05 VITALS — BP 140/70 | HR 82 | Wt 143.0 lb

## 2020-12-05 DIAGNOSIS — I10 Essential (primary) hypertension: Secondary | ICD-10-CM

## 2020-12-05 DIAGNOSIS — E785 Hyperlipidemia, unspecified: Secondary | ICD-10-CM

## 2020-12-05 NOTE — Progress Notes (Signed)
    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 as well LDL goal is less than 70 per lipid center She is started PSK 9 inhibitors LDL improved from 181 to 98 Patient reports that the last few injection she has felt a bump under her skin right after injecting and feels like it itches for a short amount of time, then goes away after a while Also reports that she is been having some discoloration at the site that appears like a bruise afterwards She has not told the lipid clinic about this yet  COVID booster received recently  PERTINENT  PMH / PSH: Hypertension, history of Mobitz type II heart block, COPD, IBS, CKD, HLD, anxiety  OBJECTIVE:   BP 140/70   Pulse 82   Wt 143 lb (64.9 kg)   SpO2 98%   BMI 27.93 kg/m    Physical Exam:  General: 65 y.o. female in NAD Cardio: RRR no m/r/g Lungs: CTAB, no wheezing, no rhonchi, no crackles, no IWOB on RA Skin: warm and dry Extremities: No edema   ASSESSMENT/PLAN:   Hypertension, essential, benign Discussed with patient that currently happy with her blood pressure, will hold off on making any further changes at this time as she has had quite a few changes in her blood pressure regimen over the last few months.  We will go ahead and continue losartan 50 mg daily and amlodipine 2.5 mg daily.  Again, she would not be able to use a beta-blocker given her history of Mobitz type II AV block.  We will go ahead and check a BMP today with her increase in the losartan.  Follow-up in 2 to 3 months.  Hyperlipidemia Advised patient that she is making good progress with her PSK 9 inhibitor.  She will continue to follow with lipid clinic.  Given that she has been having a reaction at the site, advised her that this  could possibly just be a bump from the medication as it is being inserted and the itching could just be from the presence of the medication, but given that this provider is less familiar with this use, would recommend speaking with the lipid clinic about whether or not she can continue on this medication.  For now, think that it is safe to continue as she has not had any other systemic reactions.     Unknown Jim, DO [facility]

## 2020-12-05 NOTE — Assessment & Plan Note (Signed)
Discussed with patient that currently happy with her blood pressure, will hold off on making any further changes at this time as she has had quite a few changes in her blood pressure regimen over the last few months.  We will go ahead and continue losartan 50 mg daily and amlodipine 2.5 mg daily.  Again, she would not be able to use a beta-blocker given her history of Mobitz type II AV block.  We will go ahead and check a BMP today with her increase in the losartan.  Follow-up in 2 to 3 months.

## 2020-12-05 NOTE — Assessment & Plan Note (Addendum)
Advised patient that she is making good progress with her PSK 9 inhibitor.  She will continue to follow with lipid clinic.  Given that she has been having a reaction at the site, advised her that this could possibly just be a bump from the medication as it is being inserted and the itching could just be from the presence of the medication, but given that this provider is less familiar with this use, would recommend speaking with the lipid clinic about whether or not she can continue on this medication.  For now, think that it is safe to continue as she has not had any other systemic reactions.

## 2020-12-05 NOTE — Patient Instructions (Signed)
Thank you for coming to see me today. It was a pleasure. Today we talked about:   I will call you about your labs.  Keep checking your blood pressure.  We will keep everything the same for now.  Please follow-up with me in 2-3 months.  If you have any questions or concerns, please do not hesitate to call the office at [phone].  Best,   Luis Abed, DO

## 2020-12-06 ENCOUNTER — Telehealth: Payer: Self-pay | Admitting: Cardiology

## 2020-12-06 LAB — BASIC METABOLIC PANEL
BUN/Creatinine Ratio: 9 — ABNORMAL LOW (ref 12–28)
BUN: 7 mg/dL — ABNORMAL LOW (ref 8–27)
CO2: 25 mmol/L (ref 20–29)
Calcium: 9.4 mg/dL (ref 8.7–10.3)
Chloride: 96 mmol/L (ref 96–106)
Creatinine, Ser: 0.77 mg/dL (ref 0.57–1.00)
GFR calc Af Amer: 96 mL/min/{1.73_m2} (ref >59)
GFR calc non Af Amer: 83 mL/min/{1.73_m2} (ref >59)
Glucose: 88 mg/dL (ref 65–99)
Potassium: 3.9 mmol/L (ref 3.5–5.2)
Sodium: 136 mmol/L (ref 134–144)

## 2020-12-06 NOTE — Telephone Encounter (Signed)
Ok, thanks for the follow up. Let her know to let me know if she thinks its getting worse.

## 2020-12-06 NOTE — Telephone Encounter (Signed)
Could you please help me to call the patient and get more details.

## 2020-12-06 NOTE — Telephone Encounter (Signed)
I spoke with the patient. I have advised her of Dr. Mallory Shirk recommendations.  The patient voices understanding and is agreeable.  I have confirmed with her that she is alternating her injection site as well.

## 2020-12-06 NOTE — Telephone Encounter (Signed)
Spoke to the patient just now. She let me know that she is having about dime size bruising at the injection site. She states that once she does the injection she has some severe itching for the next 4 hours. Then the itching will cease. She just has a general soreness in her stomach that she states is tolerable and it just annoying.   She states that she wants to keep taking the medication but she just wants to make sure that this is ok.

## 2020-12-06 NOTE — Telephone Encounter (Signed)
Pt c/o medication issue:  1. Name of Medication: Evolocumab (REPATHA SURECLICK) 140 MG/ML SOAJ  2. How are you currently taking this medication (dosage and times per day)? Patient injects 140 mg into the skin every 14 (fourteen) days  3. Are you having a reaction (difficulty breathing--STAT)? Yes   4. What is your medication issue? Patient states over the past month (last 2 injections) she has been experiencing bruising at the site of the injections on her abdomen. The bruising is accompanied by itchiness and a mild soreness, lasting several hours after injection. Please return call to discuss further.

## 2021-01-01 ENCOUNTER — Other Ambulatory Visit: Payer: Self-pay | Admitting: Family Medicine

## 2021-01-01 DIAGNOSIS — M109 Gout, unspecified: Secondary | ICD-10-CM

## 2021-01-04 DIAGNOSIS — M25511 Pain in right shoulder: Secondary | ICD-10-CM | POA: Diagnosis not present

## 2021-01-04 DIAGNOSIS — M9902 Segmental and somatic dysfunction of thoracic region: Secondary | ICD-10-CM | POA: Diagnosis not present

## 2021-01-04 DIAGNOSIS — M9904 Segmental and somatic dysfunction of sacral region: Secondary | ICD-10-CM | POA: Diagnosis not present

## 2021-01-04 DIAGNOSIS — M9905 Segmental and somatic dysfunction of pelvic region: Secondary | ICD-10-CM | POA: Diagnosis not present

## 2021-01-04 DIAGNOSIS — M9903 Segmental and somatic dysfunction of lumbar region: Secondary | ICD-10-CM | POA: Diagnosis not present

## 2021-01-07 DIAGNOSIS — M19011 Primary osteoarthritis, right shoulder: Secondary | ICD-10-CM | POA: Diagnosis not present

## 2021-01-07 DIAGNOSIS — M25511 Pain in right shoulder: Secondary | ICD-10-CM | POA: Diagnosis not present

## 2021-01-07 DIAGNOSIS — M9902 Segmental and somatic dysfunction of thoracic region: Secondary | ICD-10-CM | POA: Diagnosis not present

## 2021-01-07 DIAGNOSIS — M9905 Segmental and somatic dysfunction of pelvic region: Secondary | ICD-10-CM | POA: Diagnosis not present

## 2021-01-07 DIAGNOSIS — M9904 Segmental and somatic dysfunction of sacral region: Secondary | ICD-10-CM | POA: Diagnosis not present

## 2021-01-07 DIAGNOSIS — M9903 Segmental and somatic dysfunction of lumbar region: Secondary | ICD-10-CM | POA: Diagnosis not present

## 2021-01-10 ENCOUNTER — Other Ambulatory Visit: Payer: Self-pay | Admitting: Family Medicine

## 2021-01-10 DIAGNOSIS — F419 Anxiety disorder, unspecified: Secondary | ICD-10-CM

## 2021-01-30 ENCOUNTER — Encounter: Payer: Self-pay | Admitting: Family Medicine

## 2021-01-30 ENCOUNTER — Other Ambulatory Visit: Payer: Self-pay | Admitting: Family Medicine

## 2021-01-30 DIAGNOSIS — M25519 Pain in unspecified shoulder: Secondary | ICD-10-CM

## 2021-01-30 DIAGNOSIS — M542 Cervicalgia: Secondary | ICD-10-CM

## 2021-01-30 NOTE — Progress Notes (Signed)
Referral placed to sports medicine at patient request for evaluation of neck and shoulder pain.

## 2021-01-31 ENCOUNTER — Ambulatory Visit: Payer: Medicaid Other | Admitting: Sports Medicine

## 2021-01-31 ENCOUNTER — Other Ambulatory Visit: Payer: Self-pay

## 2021-01-31 VITALS — BP 134/96 | Ht 60.0 in | Wt 140.0 lb

## 2021-01-31 DIAGNOSIS — M19011 Primary osteoarthritis, right shoulder: Secondary | ICD-10-CM

## 2021-02-01 NOTE — Progress Notes (Signed)
   Subjective:    Patient ID: Diana [name], female    DOB: 12-04-1955, 65 y.o.   MRN: [id number]  HPI chief complaint: Right shoulder pain  Diana [name] comes in today complaining of right shoulder pain which has been present now for several months.  She does not recall any specific trauma but describes a gradual onset of pain that is diffuse around the shoulder.  It is most noticeable when reaching away from her body such as typing on a computer.  She notes that if she holds the arm and shoulder in a supportive position that it does help with the pain.  She has tried Advil as well as Voltaren gel but neither have been helpful.  She describes the pain as burning and constantly present.  She does also endorse some numbness and tingling down the right arm into the forearm.  She has a history of a cervical fusion done at [facility] approximately a year and a half ago.  Her current pain is different in nature than what she experienced at that time.  She recently had x-rays of her right shoulder done which are available for review.  No prior shoulder surgeries.  Interim medical history reviewed Medications reviewed Allergies reviewed    Review of Systems    As above Objective:   Physical Exam  Well-developed, well-nourished.  No acute distress.  Right shoulder: Patient maintains fairly good active and passive range of motion but there is palpable crepitus and pain at the glenohumeral joint.  No tenderness to palpation over the acromioclavicular joint.  No tenderness over the bicipital groove.  Rotator cuff strength is 5/5 but does reproduce pain.  She is neurovascularly intact distally.  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  Although she does  endorse some numbness and tingling into the right arm, I believe most of her symptoms are originating from her arthritic shoulder.  However, if she receives no benefit from the glenohumeral injection, then we may need to consider referral back to her spine surgeon.  I would like for her to return to the office 4 weeks after her injection.

## 2021-02-06 ENCOUNTER — Encounter: Payer: Self-pay | Admitting: Family Medicine

## 2021-02-06 ENCOUNTER — Other Ambulatory Visit: Payer: Self-pay

## 2021-02-06 ENCOUNTER — Ambulatory Visit: Payer: Medicaid Other | Admitting: Family Medicine

## 2021-02-06 VITALS — BP 130/82 | Ht 60.0 in | Wt 140.0 lb

## 2021-02-06 DIAGNOSIS — M19011 Primary osteoarthritis, right shoulder: Secondary | ICD-10-CM

## 2021-02-06 IMAGING — DX RIGHT HAND - COMPLETE 3+ VIEW
3 series · 3 of 3 positions shown · non-contrast
Comparison: None.

CLINICAL DATA: Fall with wrist and hand pain

EXAM:
RIGHT HAND - COMPLETE 3+ VIEW

[hand pa]
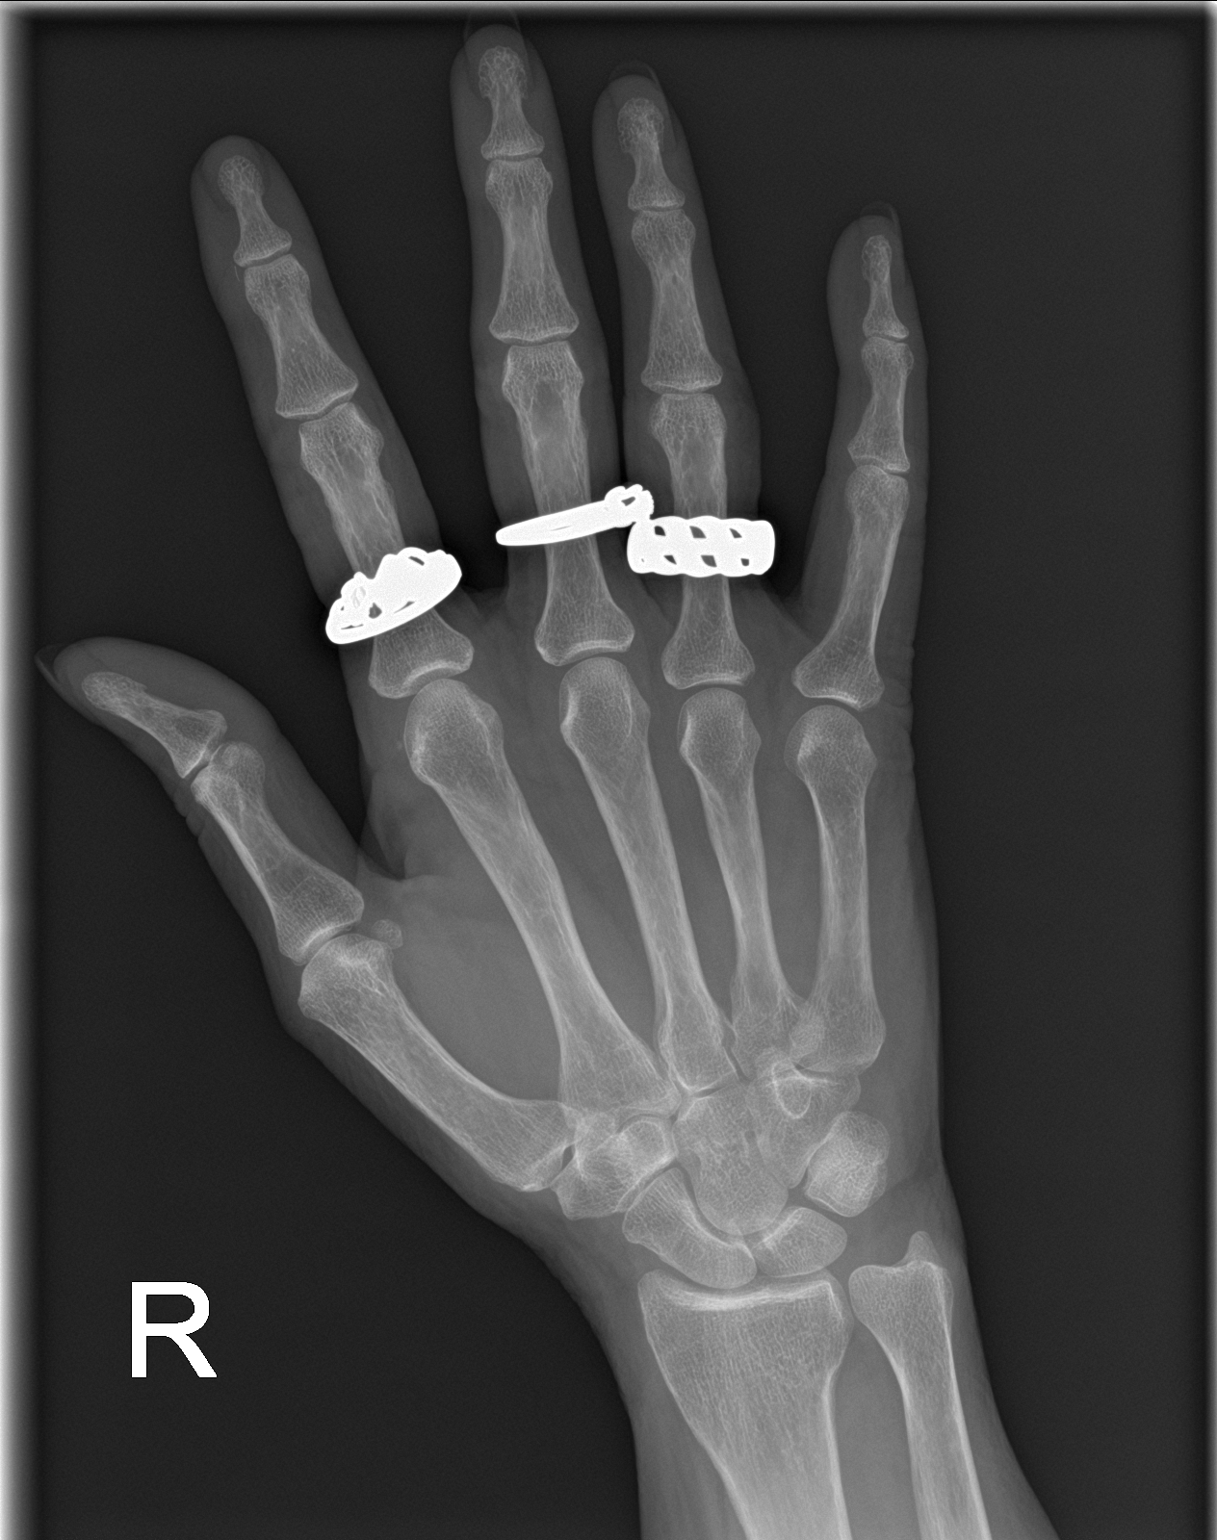

[hand obl]
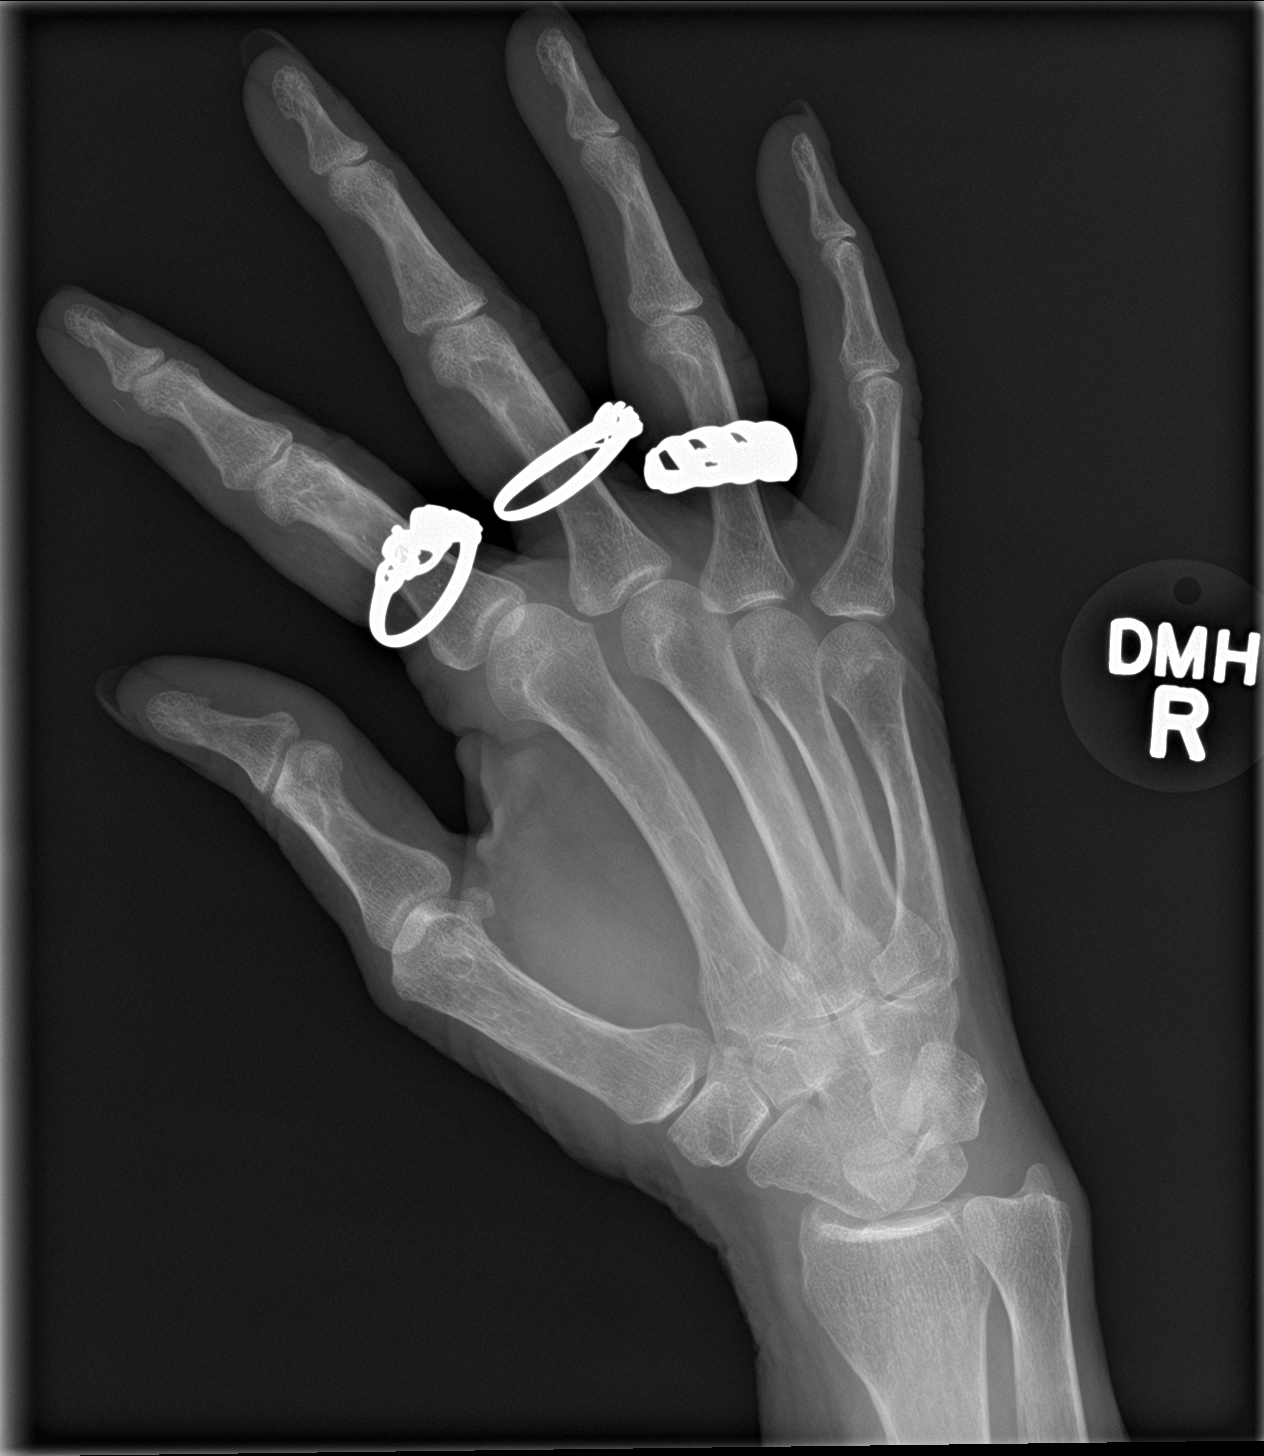

[hand lat]
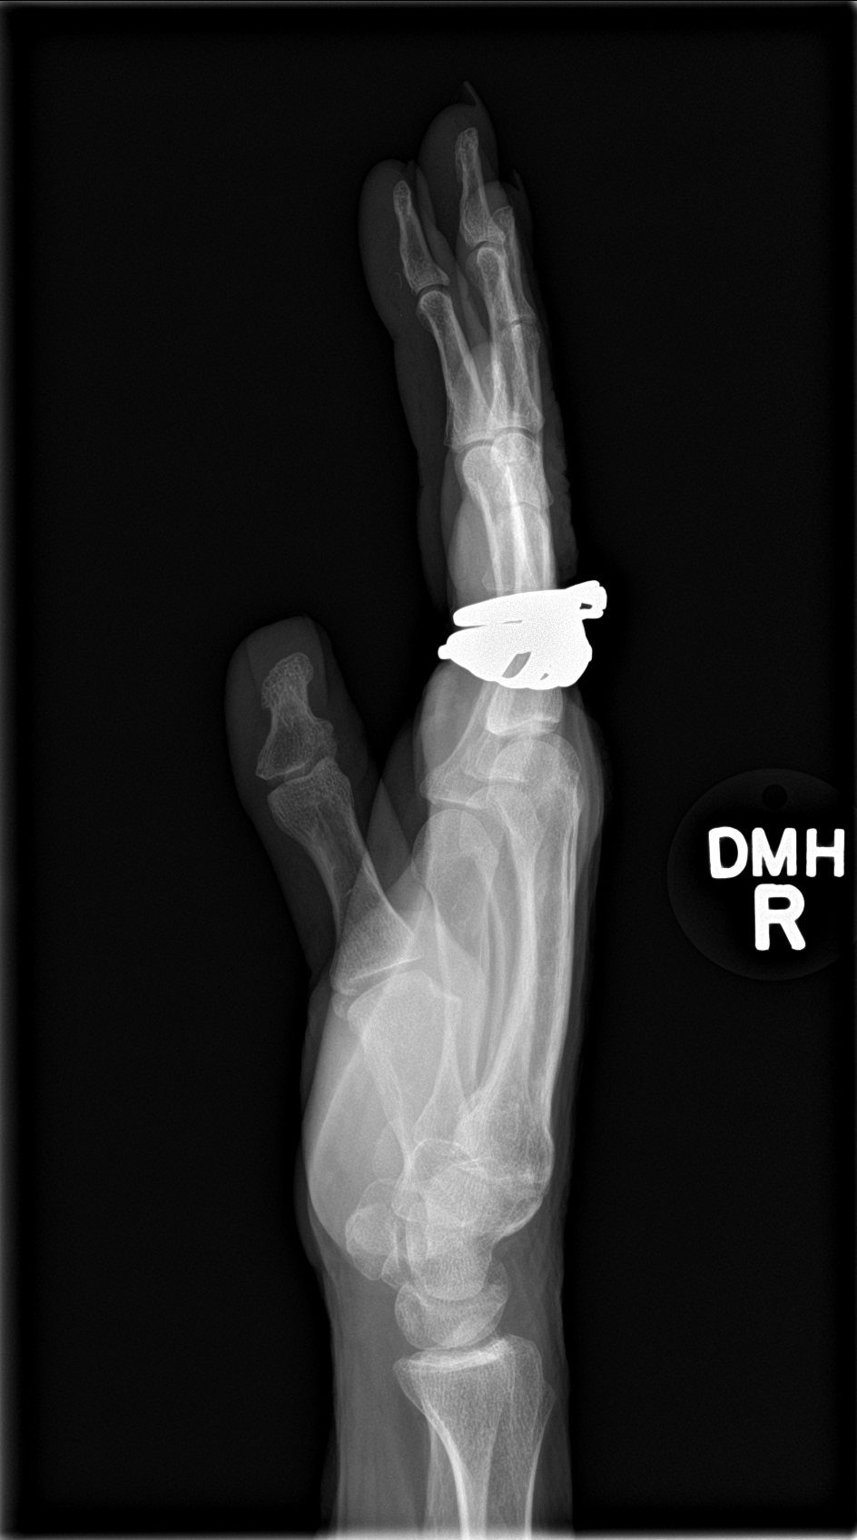

[3 of 3 positions shown; findings below may reference images not displayed]

FINDINGS: No fracture or malalignment. 2 mm linear opacity adjacent to the
second DIP joint. Soft tissue swelling at the digits.
IMPRESSION: 1. No acute osseous abnormality
2. 2 mm linear opacity adjacent to second DIP joint, may reflect
small soft tissue foreign body

## 2021-02-06 IMAGING — DX RIGHT WRIST - COMPLETE 3+ VIEW
4 series · 4 of 4 positions shown · non-contrast
Comparison: None.

CLINICAL DATA: Fall

EXAM:
RIGHT WRIST - COMPLETE 3+ VIEW

[wrist pa]
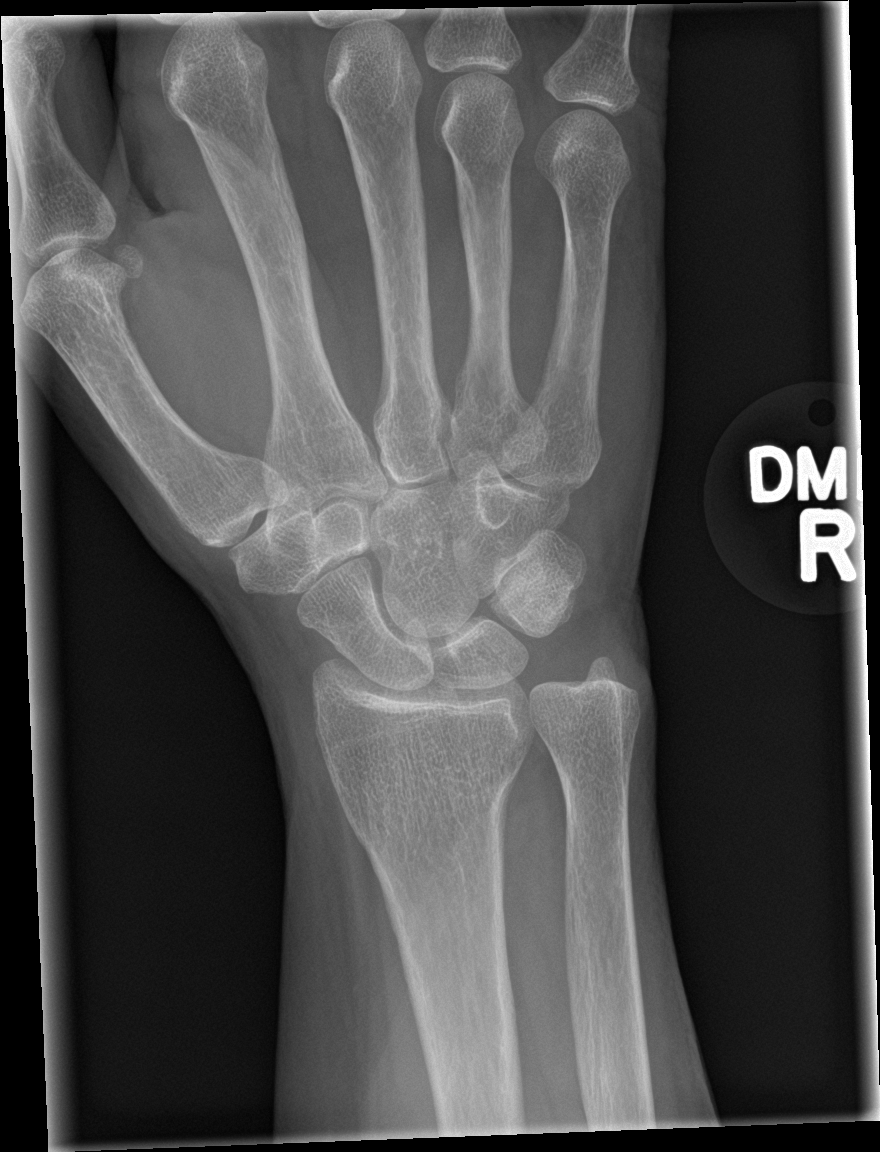

[wrist obl]
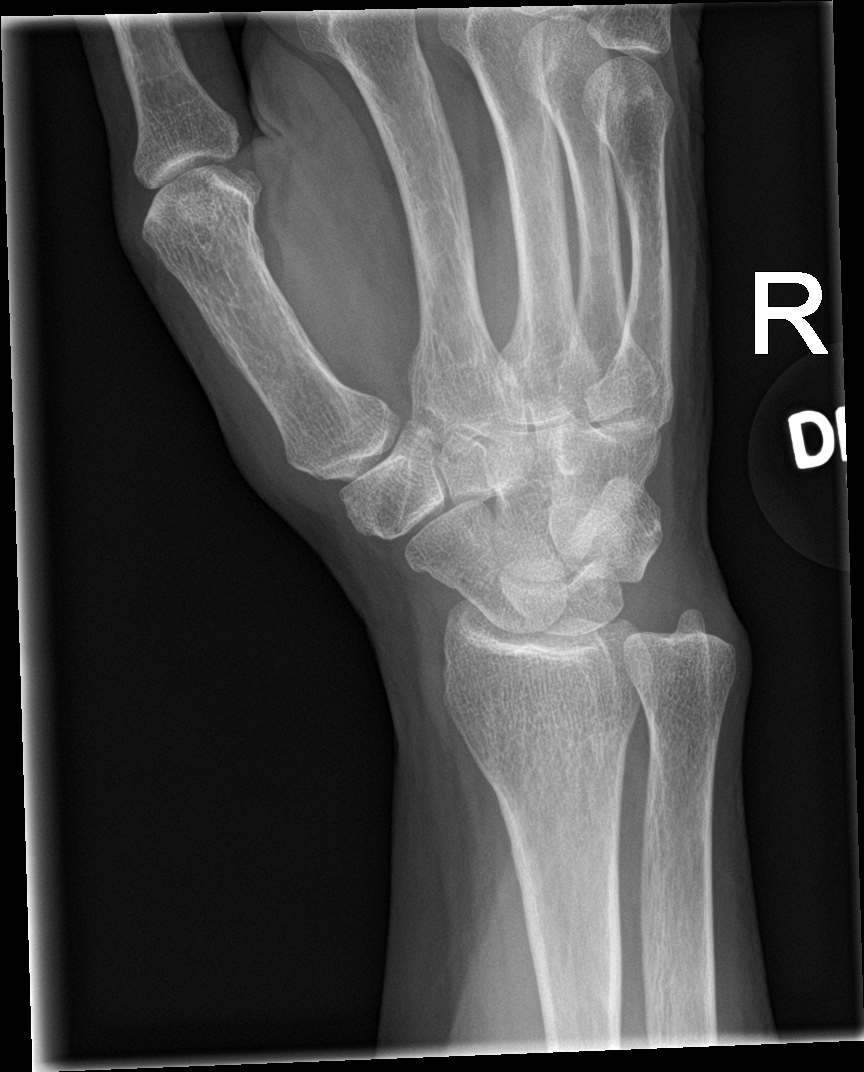

[wrist lat]
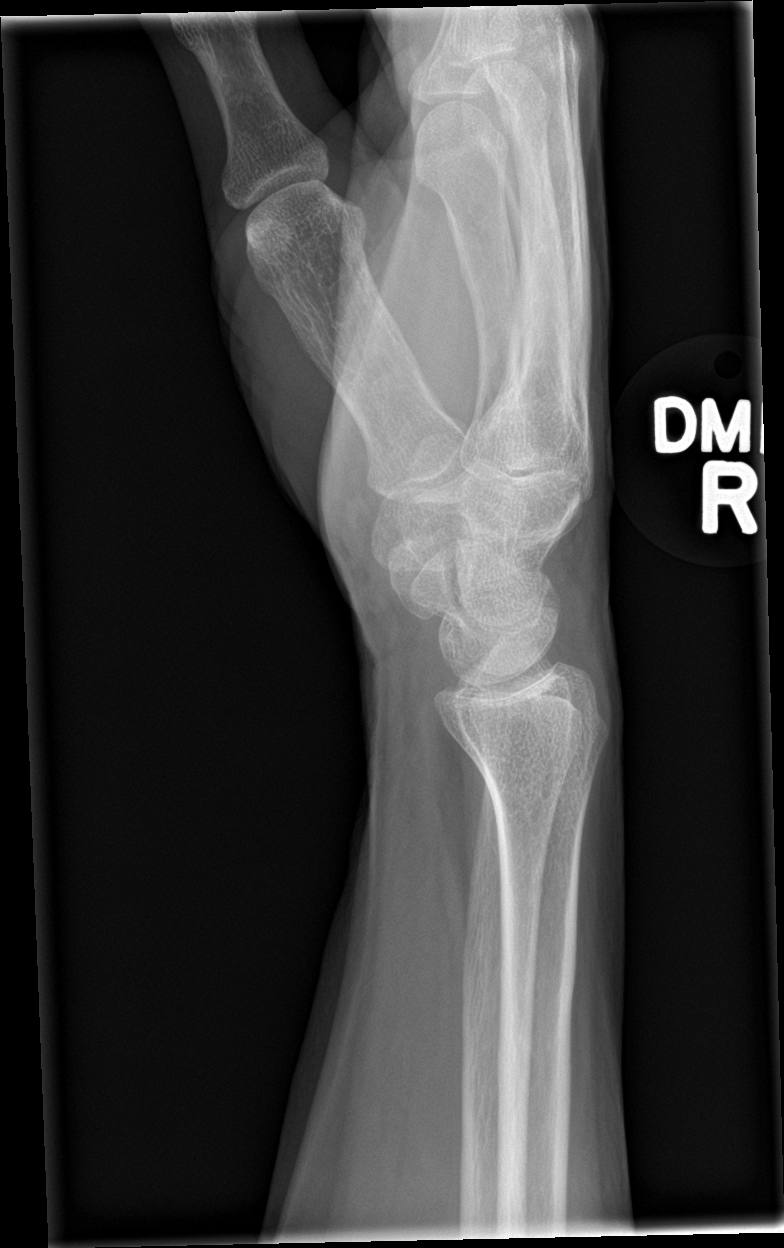

[wrist navicular]
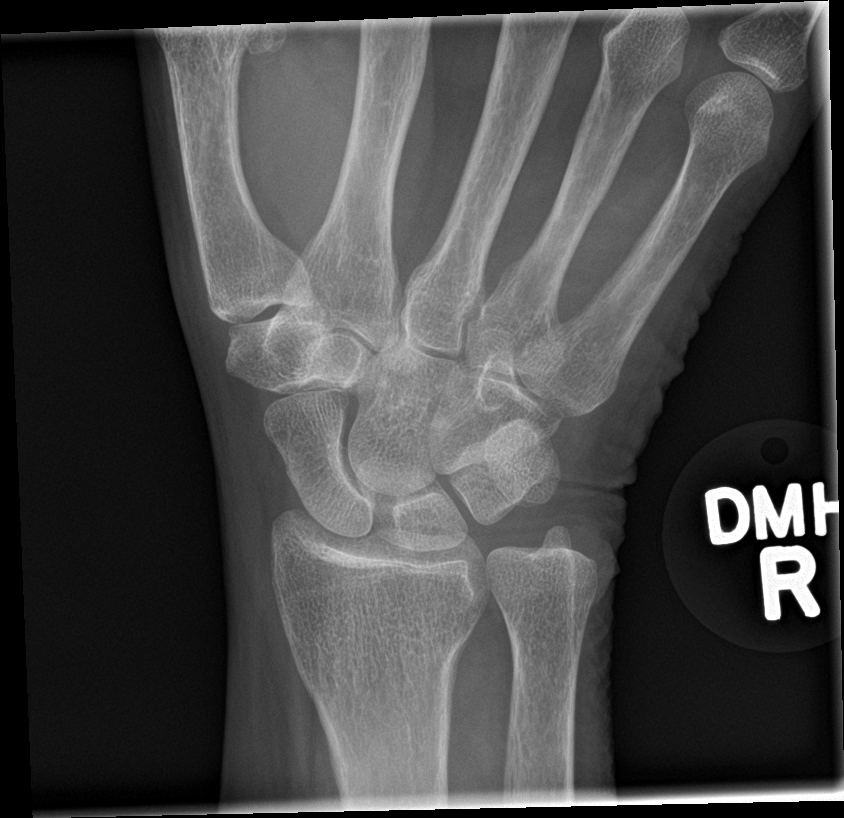

[4 of 4 positions shown; findings below may reference images not displayed]

FINDINGS: There is no evidence of fracture or dislocation. There is no
evidence of arthropathy or other focal bone abnormality. Soft
tissues are unremarkable.
IMPRESSION: Negative.

## 2021-02-06 MED ORDER — METHYLPREDNISOLONE ACETATE 40 MG/ML IJ SUSP
40.0000 mg | Freq: Once | INTRAMUSCULAR | Status: AC
  Administered 2021-02-06: 40 mg via INTRA_ARTICULAR

## 2021-02-06 NOTE — Addendum Note (Signed)
Addended by: Jolinda Croak E on: 02/06/2021 03:15 PM   Modules accepted: Orders

## 2021-02-06 NOTE — Progress Notes (Addendum)
Patient presents for right intra-articular glenohumeral injection.  She was recently seen by Dr. Micheline Chapman for concern for right shoulder pain.  He was concerned for glenohumeral arthritis versus cervical radiculopathy.  We discussed that today's injection is for both diagnostic and therapeutic purposes and she understands.  Procedure note: Following the description of risks including infection bleeding, damage to surrounding structures, patient provided verbal and written consent for left glenohumeral ultrasound guided corticosteroid injection procedure. Patient was sterilely prepped in the usual fashion with alcohol swabs x3.  Timeout was called and correct location was identified, then patient was anesthetized with 2 cc of lidocaine.  After allowing time for anesthetization to take place, glenohumeral joint was identified under ultrasound guidance and injected with a solution of 40 mg of Depo-Medrol and 3 cc of bupivacaine. Patient tolerated well without complication. Precautions provided.  Dagoberto Ligas, MD Sports Medicine Fellow, [place]  Addendum:  I was the preceptor for this visit and available for immediate consultation.  Karlton Lemon MD [facility]

## 2021-02-15 DIAGNOSIS — Z20828 Contact with and (suspected) exposure to other viral communicable diseases: Secondary | ICD-10-CM | POA: Diagnosis not present

## 2021-02-15 DIAGNOSIS — B974 Respiratory syncytial virus as the cause of diseases classified elsewhere: Secondary | ICD-10-CM | POA: Diagnosis not present

## 2021-02-15 DIAGNOSIS — U071 COVID-19: Secondary | ICD-10-CM | POA: Diagnosis not present

## 2021-02-15 DIAGNOSIS — J101 Influenza due to other identified influenza virus with other respiratory manifestations: Secondary | ICD-10-CM | POA: Diagnosis not present

## 2021-03-05 ENCOUNTER — Ambulatory Visit
Admission: RE | Admit: 2021-03-05 | Discharge: 2021-03-05 | Disposition: A | Discharge status: Home / Self Care | Payer: Medicaid Other | Source: Ambulatory Visit | Attending: Sports Medicine | Admitting: Sports Medicine

## 2021-03-05 ENCOUNTER — Ambulatory Visit: Payer: Medicaid Other | Admitting: Sports Medicine

## 2021-03-05 ENCOUNTER — Other Ambulatory Visit: Payer: Self-pay

## 2021-03-05 VITALS — BP 150/78 | Ht 61.0 in | Wt 140.0 lb

## 2021-03-05 DIAGNOSIS — M4322 Fusion of spine, cervical region: Secondary | ICD-10-CM | POA: Diagnosis not present

## 2021-03-05 DIAGNOSIS — M19011 Primary osteoarthritis, right shoulder: Secondary | ICD-10-CM | POA: Diagnosis not present

## 2021-03-05 DIAGNOSIS — M5412 Radiculopathy, cervical region: Secondary | ICD-10-CM

## 2021-03-05 DIAGNOSIS — I700 Atherosclerosis of aorta: Secondary | ICD-10-CM | POA: Diagnosis not present

## 2021-03-05 NOTE — Progress Notes (Addendum)
PCP: Cleophas Dunker, DO  Subjective:   HPI: Patient is a 65 y.o. female here for follow up regarding right shoulder pain.  Right shoulder pain Ms. Baskett has a medical history notable for right shoulder arthritis in addition to degenerative disc disease requiring cervical spine fusion previously.  She was last in clinic about 1 month ago at which time she received a right glenohumeral steroid injection to help relieve her right shoulder pain.  She was told to return to clinic in 1 month for follow-up assessment.  Today in clinic, she reports that she is about 40% better.  She reports that she no longer involuntarily holds her shoulder in a position of comfort and is able to relax her shoulder.  She also notes that her shoulder pain seems to have changed.  She no longer feels pain along the outside of her shoulder but instead, has some discomfort over her right trapezius.  She continues to note shoulder discomfort with large arm movements like taking things out of cabinets, moving things overhead or even sweeping or partaking in household tasks.  Review of Systems:  Per HPI.   Edgar, medications and smoking status reviewed.      Objective:  Physical Exam:  [place] Adult Exercise 03/05/2021  Frequency of aerobic exercise (# of days/week) 3  Average time in minutes 60  Frequency of strengthening activities (# of days/week) 3     Gen: awake, alert, NAD, comfortable in exam room Pulm: breathing unlabored  Shoulder: Inspection reveals no obvious deformity, atrophy, or asymmetry. No bruising. No swelling Palpation is normal with no TTP over [facility] joint or bicipital groove. Full passive and active ROM in flexion, abduction, internal/external rotation NV intact distally Normal scapular function observed.  Neck: Positive Spurling's test with her neck flexed and rotated to the right.  She immediately experienced severe right arm pain followed by a significant headache and  photophobia.     Assessment & Plan:  1.  Right shoulder arthritis She appears to have at least 2 problems, her shoulder arthritis and her known degenerative disc disease.  Her shoulder pain seems to be significantly improved.  She quotes a 40% improvement because she still experiences shoulder pain although this no longer seems to be related to the arthritis of the shoulder and I am suspicious that her remaining pain is primarily from a cervical etiology.  Today, we we will order shoulder and neck x-rays to assess for radiographic changes due to arthritis in both of these locations.  At this point, I do not think further intervention is merited for her shoulder arthritis.  2.  Degenerative disc disease, cervical She is previously had a cervical vertebrae fusion.  This is incredibly helpful for her symptoms at the time of the surgery.  Today in clinic, she had a very painful response to the Spurling's test indicating that it is very likely she is having some cervical impingement contributing to her right shoulder pain.  For now, we will follow-up with imaging as noted above.  Once imaging is back, we will determine if she should follow-up with her neurosurgeon.   Matilde Haymaker, MD PGY-3 03/05/2021 4:44 PM  Patient seen and evaluated with the resident.  I agree with the above plan of care.  X-rays are reviewed.  X-rays of the right shoulder do continue to show mild to moderate glenohumeral osteoarthritis.  X-rays of the cervical spine show intact hardware from her previous cervical spine fusion.  I do believe  the patient has 2 distinct problems.  Given the fact that her right shoulder pain did not improve with our diagnostic/therapeutic glenohumeral cortisone injection, I would like to order an MRI specifically to rule out rotator cuff pathology or a degenerative labral tear.  Phone follow-up with those results when available.  In the meantime, I have recommended that she follow-up with her spine surgeon  in [place] to see if they recommend any further work-up from a cervical spine point of view.  Patient is in agreement with this plan.

## 2021-03-06 DIAGNOSIS — H35371 Puckering of macula, right eye: Secondary | ICD-10-CM | POA: Diagnosis not present

## 2021-03-06 DIAGNOSIS — H35342 Macular cyst, hole, or pseudohole, left eye: Secondary | ICD-10-CM | POA: Diagnosis not present

## 2021-03-06 DIAGNOSIS — H34212 Partial retinal artery occlusion, left eye: Secondary | ICD-10-CM | POA: Diagnosis not present

## 2021-03-06 DIAGNOSIS — Z9889 Other specified postprocedural states: Secondary | ICD-10-CM | POA: Diagnosis not present

## 2021-03-19 ENCOUNTER — Other Ambulatory Visit: Payer: Self-pay

## 2021-03-19 DIAGNOSIS — M19011 Primary osteoarthritis, right shoulder: Secondary | ICD-10-CM

## 2021-03-19 NOTE — Progress Notes (Signed)
MRI facility in [place] was out of network with pt's insurance plan. Pt opted to get the MRI done at [place]. Order placed. Pt will call to schedule.

## 2021-03-21 ENCOUNTER — Other Ambulatory Visit: Payer: Self-pay

## 2021-03-21 ENCOUNTER — Telehealth: Payer: Self-pay

## 2021-03-21 DIAGNOSIS — E7801 Familial hypercholesterolemia: Secondary | ICD-10-CM | POA: Diagnosis not present

## 2021-03-21 DIAGNOSIS — M19011 Primary osteoarthritis, right shoulder: Secondary | ICD-10-CM

## 2021-03-21 DIAGNOSIS — M5412 Radiculopathy, cervical region: Secondary | ICD-10-CM | POA: Diagnosis not present

## 2021-03-21 DIAGNOSIS — Z981 Arthrodesis status: Secondary | ICD-10-CM | POA: Diagnosis not present

## 2021-03-21 DIAGNOSIS — M47812 Spondylosis without myelopathy or radiculopathy, cervical region: Secondary | ICD-10-CM | POA: Diagnosis not present

## 2021-03-21 LAB — LIPID PANEL
Chol/HDL Ratio: 3 ratio (ref 0.0–4.4)
Cholesterol, Total: 199 mg/dL (ref 100–199)
HDL: 66 mg/dL (ref >39)
LDL Chol Calc (NIH): 113 mg/dL — ABNORMAL HIGH (ref 0–99)
Triglycerides: 111 mg/dL (ref 0–149)
VLDL Cholesterol Cal: 20 mg/dL (ref 5–40)

## 2021-03-21 MED ORDER — REPATHA PUSHTRONEX SYSTEM 420 MG/3.5ML ~~LOC~~ SOCT: 420.0000 mg | SUBCUTANEOUS | 11 refills | Status: DC

## 2021-03-21 NOTE — Telephone Encounter (Signed)
Called and spoke w/pt regarding the need to change to the repatha pushtronix due to increased ldl. Pt voiced understanding and will complete fasting labs again in 2 months. I also instructed to contact me if they have issues learning how to use the medication and we can get them trained

## 2021-03-26 ENCOUNTER — Other Ambulatory Visit: Payer: Medicaid Other

## 2021-03-26 ENCOUNTER — Other Ambulatory Visit: Payer: Self-pay | Admitting: Family Medicine

## 2021-03-26 DIAGNOSIS — M109 Gout, unspecified: Secondary | ICD-10-CM

## 2021-04-07 ENCOUNTER — Other Ambulatory Visit: Payer: Self-pay

## 2021-04-07 ENCOUNTER — Ambulatory Visit
Admission: RE | Admit: 2021-04-07 | Discharge: 2021-04-07 | Disposition: A | Discharge status: Home / Self Care | Payer: Medicaid Other | Source: Ambulatory Visit | Attending: Sports Medicine | Admitting: Sports Medicine

## 2021-04-07 ENCOUNTER — Other Ambulatory Visit: Payer: Medicaid Other

## 2021-04-07 DIAGNOSIS — M25511 Pain in right shoulder: Secondary | ICD-10-CM | POA: Diagnosis not present

## 2021-04-11 ENCOUNTER — Other Ambulatory Visit: Payer: Self-pay | Admitting: Family Medicine

## 2021-04-11 DIAGNOSIS — K581 Irritable bowel syndrome with constipation: Secondary | ICD-10-CM

## 2021-04-16 ENCOUNTER — Other Ambulatory Visit: Payer: Self-pay

## 2021-04-16 ENCOUNTER — Ambulatory Visit: Payer: Medicaid Other | Admitting: Sports Medicine

## 2021-04-16 VITALS — BP 128/68 | Ht 61.0 in | Wt 146.0 lb

## 2021-04-16 DIAGNOSIS — M75101 Unspecified rotator cuff tear or rupture of right shoulder, not specified as traumatic: Secondary | ICD-10-CM | POA: Diagnosis not present

## 2021-04-16 MED ORDER — METHYLPREDNISOLONE ACETATE 40 MG/ML IJ SUSP
40.0000 mg | Freq: Once | INTRAMUSCULAR | Status: AC
  Administered 2021-04-16: 40 mg via INTRA_ARTICULAR

## 2021-04-17 NOTE — Progress Notes (Signed)
Patient ID: Diana [name], female   DOB: Jul 16, 1956, 65 y.o.   MRN: [id number]  Diana [name] presents today at my request for a subacromial cortisone injection into her right shoulder.  Recent MRI of her right shoulder showed significant supraspinatus tendinopathy and a small full-thickness tear.  She also has mild to moderate degenerative changes in the glenohumeral joint but a recent diagnostic/therapeutic ultrasound-guided intra-articular injection only provided her with partial relief.  I recommended that we couple today's subacromial injection with a home Jobe exercise program and have her follow-up with me again in 4 weeks.  If symptoms persist, consider referral to orthopedics to discuss surgical options.  Of note, patient did see her neurosurgeon in [place] and had x-rays of her cervical spine done which are available for my review.  AP and lateral views show an ACDF at C5-C7 with hardware intact and nothing acute.   Consent obtained and verified. Time-out conducted. Noted no overlying erythema, induration, or other signs of local infection. Skin prepped in a sterile fashion. Topical analgesic spray: Ethyl chloride. Joint: right shoulder (subacromial) Needle: 25g 1.5 inch Completed without difficulty. Meds: 3cc 1% xylocaine, 1cc (40mg ) depomedrol  Advised to call if fevers/chills, erythema, induration, drainage, or persistent bleeding.

## 2021-04-23 ENCOUNTER — Other Ambulatory Visit: Payer: Self-pay | Admitting: Family Medicine

## 2021-04-23 DIAGNOSIS — M109 Gout, unspecified: Secondary | ICD-10-CM

## 2021-04-23 DIAGNOSIS — I10 Essential (primary) hypertension: Secondary | ICD-10-CM

## 2021-04-30 ENCOUNTER — Other Ambulatory Visit: Payer: Self-pay | Admitting: Family Medicine

## 2021-05-02 ENCOUNTER — Other Ambulatory Visit: Payer: Self-pay | Admitting: Family Medicine

## 2021-05-02 DIAGNOSIS — F419 Anxiety disorder, unspecified: Secondary | ICD-10-CM

## 2021-05-06 ENCOUNTER — Other Ambulatory Visit: Payer: Self-pay | Admitting: *Deleted

## 2021-05-06 DIAGNOSIS — E7801 Familial hypercholesterolemia: Secondary | ICD-10-CM | POA: Diagnosis not present

## 2021-05-06 LAB — HEPATIC FUNCTION PANEL
ALT: 15 IU/L (ref 0–32)
AST: 20 IU/L (ref 0–40)
Albumin: 4.3 g/dL (ref 3.8–4.8)
Alkaline Phosphatase: 138 IU/L — ABNORMAL HIGH (ref 44–121)
Bilirubin Total: 0.4 mg/dL (ref 0.0–1.2)
Bilirubin, Direct: 0.12 mg/dL (ref 0.00–0.40)
Total Protein: 6.7 g/dL (ref 6.0–8.5)

## 2021-05-06 LAB — LIPID PANEL
Chol/HDL Ratio: 3 ratio (ref 0.0–4.4)
Cholesterol, Total: 204 mg/dL — ABNORMAL HIGH (ref 100–199)
HDL: 69 mg/dL (ref >39)
LDL Chol Calc (NIH): 114 mg/dL — ABNORMAL HIGH (ref 0–99)
Triglycerides: 119 mg/dL (ref 0–149)
VLDL Cholesterol Cal: 21 mg/dL (ref 5–40)

## 2021-05-07 ENCOUNTER — Other Ambulatory Visit: Payer: Self-pay

## 2021-05-07 ENCOUNTER — Telehealth: Payer: Self-pay

## 2021-05-07 MED ORDER — PITAVASTATIN CALCIUM 1 MG PO TABS: 1.0000 mg | ORAL_TABLET | ORAL | 6 refills | Status: DC

## 2021-05-07 NOTE — Telephone Encounter (Signed)
Called and spoke w/pt and stated that they can come to pharmd visit on 05/17/21

## 2021-05-07 NOTE — Telephone Encounter (Signed)
lmom for results

## 2021-05-07 NOTE — Telephone Encounter (Signed)
Pt called back and stated that they are having serious myalgias but yes they have been taking the repatha 420mg  monthly and sometimes they are waking her up in pain. Will route to raquel rph

## 2021-05-17 ENCOUNTER — Ambulatory Visit (INDEPENDENT_AMBULATORY_CARE_PROVIDER_SITE_OTHER): Payer: Medicaid Other | Admitting: Pharmacist Clinician (PhC)/ Clinical Pharmacy Specialist

## 2021-05-17 ENCOUNTER — Other Ambulatory Visit: Payer: Self-pay

## 2021-05-17 DIAGNOSIS — E7801 Familial hypercholesterolemia: Secondary | ICD-10-CM | POA: Diagnosis not present

## 2021-05-17 DIAGNOSIS — I10 Essential (primary) hypertension: Secondary | ICD-10-CM

## 2021-05-17 MED ORDER — PRALUENT 150 MG/ML ~~LOC~~ SOAJ
150.0000 mg | SUBCUTANEOUS | 0 refills | Status: DC
Start: 2021-05-17 — End: 2021-06-24

## 2021-05-17 NOTE — Assessment & Plan Note (Signed)
Patient with essential hypertension, she suspects that amlodipine may be making her muscle issues worse.  Will have her decrease dose from 5 mg to 2.5 mg daily and increase losartan from 50 mg to 100 mg daily.  Asked that she keep home BP records for the next month and bring that information to next appointment.  Will hopefully switch losartan to olemsartan or valsartan at that time (for better control) and eliminate amlodipine completely.

## 2021-05-17 NOTE — Progress Notes (Signed)
05/17/2021 Diana [name] [birth year] [id number]   HPI:  Diana [name] is a 65 y.o. female patient of Dr Harriet Masson, who presents today for a lipid clinic evaluation.  See pertinent past medical history below.  She was seen in [facility] last fall for lipid lowering management.  At that time she chose to go on [employer].  Her baseline LDL (from 2016) was noted to be 214.  She has been intolerant to several statin drugs (see below) and did well with the Repatha for the first month or so.  She then began to develop muscle aches and cramping/spasms.  She switched to the Pushtronix, hoping the less frequent injections would be better, but unfortunately that has not been the case.  She continues to use the medication (last dose June 2), but would like to discuss other options.  She also wonders if any of her other medications could be causing the muscle cramping/spasms.  Despite compliance with the PCSK-9 inhibitor, her LDL cholesterol has dropped only to 114.  After the most recent lab, Dr. Harriet Masson added Livalo 1 mg twice weekly.  Unfortunately she mis-read the instructions and has been taking twice daily for about a week now.  She admits the muscle cramping, which has never gone away, has been worse this past week.   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, carotid stenosis (R ICA 60-79%, R ECA > 50%, L ICA 60-79%, L ECA > 50%)  hypertension 150/94 at last visit - on amlodipine 2.5, losartan 25  migraines Uses Excedrin Migraine, Imitrex tabs    Current Medications: none  Cholesterol Goals: LDL < 70   Intolerant/previously tried: pravastatin, simvastatin, atorvastatin, rosuvastatin  Family history: father in 78's with first stroke, and multiple TIA, MI in 23's, multiple TIA's through his 79's died at 86 from stroke; mother with AF, in 78's, died from stroke recently at 35; siblings seem to be healthy; 2 children - healthy  Diet: mix of home and eating out.  Most eating out is home-style with  lots of vegetables; [place] and chicken mostly for protein  no fried foods   Exercise:  does in home core training, just started pilates class  Labs: 6/22:  TC 204, TG 119, HDL 69, LDL 114  Baseline LDL from 2016  - 214   Current Outpatient Medications  Medication Sig Dispense Refill   albuterol (PROVENTIL HFA;VENTOLIN HFA) 108 (90 Base) MCG/ACT inhaler Inhale 2 puffs into the lungs every 6 (six) hours as needed for wheezing. 8.5 g 5   Alirocumab (PRALUENT) 150 MG/ML SOAJ Inject 150 mg into the skin every 14 (fourteen) days. 1 mL 0   allopurinol (ZYLOPRIM) 100 MG tablet TAKE 1 TABLET BY MOUTH DAILY 30 tablet 0   amLODipine (NORVASC) 5 MG tablet Take 2.5 mg by mouth daily.     aspirin-acetaminophen-caffeine (EXCEDRIN MIGRAINE) 250-250-65 MG tablet Take 2 tablets by mouth every 6 (six) hours as needed for headache.     Calcium Carbonate-Vitamin D 600-200 MG-UNIT TABS 1 tablet.     diclofenac sodium (VOLTAREN) 1 % GEL Apply 2 g topically 4 (four) times daily as needed. 100 g 5   esomeprazole (NEXIUM) 40 MG capsule TAKE 1 CAPSULE BY MOUTH DAILY AT 12 NOON 90 capsule 1   LINZESS 145 MCG CAPS capsule TAKE 1 CAPSULE BY MOUTH DAILY BEFORE BREAKFAST 30 capsule 5   losartan (COZAAR) 50 MG tablet TAKE 1 TABLET BY MOUTH DAILY AT BEDTIME (Patient taking differently: Take 100 mg by  mouth daily.) 90 tablet 1   Omega-3 Fatty Acids (OMEGA III EPA+DHA PO) Take 1 capsule by mouth in the morning and at bedtime.     oxyCODONE (OXY IR/ROXICODONE) 5 MG immediate release tablet 5 mg.     Pitavastatin Calcium 1 MG TABS Take 1 tablet (1 mg total) by mouth 2 (two) times a week. 30 tablet 6   SUMAtriptan (IMITREX) 50 MG tablet Take 1 tablet (50 mg total) by mouth every 2 (two) hours as needed for migraine. May repeat in 2 hours if headache persists or recurs. 10 tablet 3   Tiotropium Bromide-Olodaterol (STIOLTO RESPIMAT) 2.5-2.5 MCG/ACT AERS Inhale 2 puffs into the lungs daily. 4 g 3   venlafaxine XR (EFFEXOR-XR) 37.5  MG 24 hr capsule TAKE 1 CAPSULE BY MOUTH ONCE DAILY WITH BREAKFAST 30 capsule 3   No current facility-administered medications for this visit.    Allergies  Allergen Reactions   Penicillins Anaphylaxis   Demerol [Meperidine] Nausea And Vomiting   Doxycycline Hyclate Nausea And Vomiting   Gabapentin Other (See Comments)    Malaise   Sulfa Antibiotics Hives   Atorvastatin     myalgias   Fluvastatin     myalgia   Levaquin [Levofloxacin]     Reported some possible facial swelling with administration 05/2018   Lovastatin     myalgia   No Known Allergies     Other reaction(s): Dizziness   Pitavastatin     myalgia   Pravastatin     myalgia   Rosuvastatin     myalgia   Simvastatin     myalgia   Benadryl [Diphenhydramine Hcl] Palpitations and Rash   Buprenorphine Hcl Nausea And Vomiting   Cephalexin Rash   Morphine And Related Nausea And Vomiting   Spiriva Handihaler [Tiotropium Bromide Monohydrate] Rash    Past Medical History:  Diagnosis Date   Anxiety    Asthma    Barrett esophagus    "don't know that I've ever been stretched" (08/17/2013)   Choledocholithiasis with acute cholecystitis    Chronic lower back pain    COPD (chronic obstructive pulmonary disease) ([place])    Craniosynostosis    congenital   DDD (degenerative disc disease)    Family history of anesthesia complication    "PONV; both parents" (08/17/2013)   GERD (gastroesophageal reflux disease)    H/O hiatal hernia    History of blood transfusion 1959-1960   Hollenhorst plaque    Left eye   Hypercholesterolemia 05/28/2012   Has taken Crestor and Lipitor in the past. Switched to lovastatin b/c its $4.     Hypertension    IBS (irritable bowel syndrome)    Migraines    "since age 51; probably twice/month" (08/17/2013)   PONV (postoperative nausea and vomiting)    Rheumatoid arthritis ([place])    "in my knuckles" (08/17/2013)   Ruptured lumbar disc 1990's   L4-L5   Shingles     Blood pressure (!) 146/88,  pulse 98.   Hypertension, essential, benign Patient with essential hypertension, she suspects that amlodipine may be making her muscle issues worse.  Will have her decrease dose from 5 mg to 2.5 mg daily and increase losartan from 50 mg to 100 mg daily.  Asked that she keep home BP records for the next month and bring that information to next appointment.  Will hopefully switch losartan to olemsartan or valsartan at that time (for better control) and eliminate amlodipine completely.    Hyperlipidemia Patient with ASCVD and familial  hyperlipidemia.  She has shared this information with her children and so far neither has appeared to inherit the trait.  Baseline LDL was 214.  Because she thinks the Repatha (or amlodipine) may be cause of her muscle pains and cramping, will give her a trial of Praluent.  Also, as she was taking Livalo twice daily instead of twice weekly, it's hard to determine actual cause of muscular issues.  Will have her hold Livalo for 10 days then restart, as directed, only on Mondays and Fridays.  After 3 weeks on this, she should try the Praluent - sample given to patient in the office today.  She will return in 4 weeks to determine if muscle issues are improved.     Tommy Medal PharmD CPP [facility] [address] [place], [place] [address] [phone]

## 2021-05-17 NOTE — Assessment & Plan Note (Signed)
Patient with ASCVD and familial hyperlipidemia.  She has shared this information with her children and so far neither has appeared to inherit the trait.  Baseline LDL was 214.  Because she thinks the Repatha (or amlodipine) may be cause of her muscle pains and cramping, will give her a trial of Praluent.  Also, as she was taking Livalo twice daily instead of twice weekly, it's hard to determine actual cause of muscular issues.  Will have her hold Livalo for 10 days then restart, as directed, only on Mondays and Fridays.  After 3 weeks on this, she should try the Praluent - sample given to patient in the office today.  She will return in 4 weeks to determine if muscle issues are improved.

## 2021-05-17 NOTE — Patient Instructions (Signed)
Return for a a follow up appointment in July 21 at 3 pm  Check your blood pressure at home 3-4 days per week and keep record of the readings.  Take your BP meds as follows:  Cut amlodipine to 1/2 tablet (2.5 mg) once daily  Double losartan to 100 mg once daily  Take you cholesterol meds as follows:  Stop Repatha  Don't take any Livalo until June 27.  Then take 1 tablet ONLY on Mondays and Fridays  On July 18 take dose of Praluent (injection, sample)  Bring all of your meds, your BP cuff and your record of home blood pressures to your next appointment.  Exercise as you're able, try to walk approximately 30 minutes per day.  Keep salt intake to a minimum, especially watch canned and prepared boxed foods.  Eat more fresh fruits and vegetables and fewer canned items.  Avoid eating in fast food restaurants.    HOW TO TAKE YOUR BLOOD PRESSURE: Rest 5 minutes before taking your blood pressure.  Don't smoke or drink caffeinated beverages for at least 30 minutes before. Take your blood pressure before (not after) you eat. Sit comfortably with your back supported and both feet on the floor (don't cross your legs). Elevate your arm to heart level on a table or a desk. Use the proper sized cuff. It should fit smoothly and snugly around your bare upper arm. There should be enough room to slip a fingertip under the cuff. The bottom edge of the cuff should be 1 inch above the crease of the elbow. Ideally, take 3 measurements at one sitting and record the average.

## 2021-05-21 ENCOUNTER — Other Ambulatory Visit: Payer: Self-pay | Admitting: Family Medicine

## 2021-05-21 ENCOUNTER — Other Ambulatory Visit: Payer: Self-pay

## 2021-05-21 ENCOUNTER — Ambulatory Visit: Payer: Medicaid Other | Admitting: Sports Medicine

## 2021-05-21 VITALS — BP 130/78 | Ht 61.0 in | Wt 145.0 lb

## 2021-05-21 DIAGNOSIS — M75101 Unspecified rotator cuff tear or rupture of right shoulder, not specified as traumatic: Secondary | ICD-10-CM | POA: Diagnosis not present

## 2021-05-21 DIAGNOSIS — M109 Gout, unspecified: Secondary | ICD-10-CM

## 2021-05-21 NOTE — Assessment & Plan Note (Signed)
Improved. Significant improvement with subacromial injection in May 2022 indicating likely source of patient pain. She has only mild pain that is relieved with PRN Aleve. She has been completing home exercise program with great success. She is not interested in ortho referral or surgery. Recommended continued conservative therapy. Patient to follow up PRN.

## 2021-05-21 NOTE — Progress Notes (Signed)
   Subjective:   Patient ID: Diana [name]    DOB: 1956/07/21, 65 y.o. female   MRN: [id number]  Diana [name] is a 65 y.o. female with a history of right shoulder pain here for follow-up.  Patient was seen on 04/16/2021.  She has had MRI of her right shoulder that showed significant supraspinatus tendinopathy and a small full-thickness tear.  She had mild to moderate degenerative changes in the glenohumeral joint.  She has received ultrasound-guided intra-articular injection of the glenohumeral joint with minimal improvement.  During her last appointment on 5/17 she received a subacromial injection and was instructed to do home Jobe exercise program. She presents today for 4 week follow up and notes much improvement with the steroid injection.  She has been doing the home exercises 3 times a day and feels like she is getting much stronger and has better range of motion.  She is completely against shoulder surgery and does not desire Ortho referral.  She is open to continuing conservative management.  Review of Systems:  Per HPI.   Objective:   BP 130/78   Ht 5\' 1"  (1.549 m)   Wt 145 lb (65.8 kg)   BMI 27.40 kg/m  Vitals and nursing note reviewed.  General: pleasant older female, sitting comfortably on exam bed, well nourished, well developed, in no acute distress with non-toxic appearance Resp: breathing comfortably on room air, speaking in full sentences Skin: warm, dry, no rashes or lesions Extremities: warm and well perfused, normal tone MSK:   Right Shoulder: Inspection reveals no obvious deformity, atrophy, or asymmetry. No bruising. No swelling Palpation is normal with no TTP over [facility] joint, some mild tenderness over bicipital groove Full ROM in flexion, abduction, internal/external rotation NV intact distally Normal scapular function observed. Special Tests:  - Impingement: pain with Hawkins and empty can sign after shoulder got aggravated during strength testing - Supraspinatous:  4/5 strength with resisted flexion at 20 degrees - Infraspinatous/Teres [place]: 5/5 strength with ER. Negative pain with resisted ER - Subscapularis: 5/5 strength with IR, Negative pain with resisted IR - No painful arc and no drop arm sign  Left shoulder: No pain to palpation. No skin changes. Good strength and ROM. Negative hawkins and empty can. Normal supraspinatus strength.  Neuro: Alert and oriented, speech normal  Assessment & Plan:   Rotator cuff tear, non-traumatic, right Improved. Significant improvement with subacromial injection in May 2022 indicating likely source of patient pain. She has only mild pain that is relieved with PRN Aleve. She has been completing home exercise program with great success. She is not interested in ortho referral or surgery. Recommended continued conservative therapy. Patient to follow up PRN.  No orders of the defined types were placed in this encounter.  No orders of the defined types were placed in this encounter.     Patient seen and evaluated with the resident.  I agree with the above plan of care.  Patient has noted significant symptom relief after a recent subacromial cortisone injection.  She will continue with her home exercises.  If symptoms return then we could consider repeat cortisone injection versus surgical referral.  Follow-up for ongoing or recalcitrant issues.  Mina Marble, DO PGY-3, [facility] 05/21/2021 4:49 PM

## 2021-06-09 DIAGNOSIS — H6691 Otitis media, unspecified, right ear: Secondary | ICD-10-CM | POA: Diagnosis not present

## 2021-06-09 DIAGNOSIS — Z20828 Contact with and (suspected) exposure to other viral communicable diseases: Secondary | ICD-10-CM | POA: Diagnosis not present

## 2021-06-10 DIAGNOSIS — R051 Acute cough: Secondary | ICD-10-CM | POA: Diagnosis not present

## 2021-06-13 ENCOUNTER — Encounter: Payer: Self-pay | Admitting: Family Medicine

## 2021-06-13 ENCOUNTER — Ambulatory Visit: Payer: Medicaid Other | Admitting: Family Medicine

## 2021-06-13 DIAGNOSIS — H6091 Unspecified otitis externa, right ear: Secondary | ICD-10-CM | POA: Insufficient documentation

## 2021-06-13 DIAGNOSIS — H60501 Unspecified acute noninfective otitis externa, right ear: Secondary | ICD-10-CM

## 2021-06-13 DIAGNOSIS — Z01118 Encounter for examination of ears and hearing with other abnormal findings: Secondary | ICD-10-CM | POA: Diagnosis not present

## 2021-06-13 MED ORDER — PRAMOXINE-CHLOROXYLENOL 1-0.1 % OT LIQD
1.0000 [drp] | Freq: Four times a day (QID) | OTIC | 0 refills | Status: DC | PRN
Start: 2021-06-13 — End: 2021-10-16

## 2021-06-13 MED ORDER — NEOMYCIN-POLYMYXIN-HC 3.5-10000-1 OT SOLN: 3.0000 [drp] | OTIC | 0 refills | Status: DC

## 2021-06-13 MED ORDER — HYDROCODONE-ACETAMINOPHEN 5-325 MG PO TABS: 1.0000 | ORAL_TABLET | Freq: Four times a day (QID) | ORAL | 0 refills | Status: DC | PRN

## 2021-06-13 NOTE — Patient Instructions (Addendum)
I believe you have inflammation of the external ear canal.  The polymixin-neomycin-hydrocortisone will help decrease the inflammation over the next 2 to 3 days.  This should bring down the pain over 2 to 3 days.  In the meantime, try the ear drops with pramoxine in it, it is a numbing medication. This may help decrease the pain.  Unfortunately, the medication I would like to use, Auralgan, has been taken off the market.   You will have hydrocodone-acetaminophen talbets available for any pain that is not controlled while you wait for the inflammation to die down.   Otitis Externa  Otitis externa is an infection of the outer ear canal. The outer ear canal is the area between the outside of the ear and the eardrum. Otitis externa issometimes called swimmer's ear. What are the causes? Common causes of this condition include: Swimming in dirty water. Moisture in the ear. An injury to the inside of the ear. An object stuck in the ear. A cut or scrape on the outside of the ear. What increases the risk? You are more likely to develop this condition if you go swimming often. What are the signs or symptoms? The first symptom of this condition is often itching in the ear. Later symptoms of the condition include: Swelling of the ear. Redness in the ear. Ear pain. The pain may get worse when you pull on your ear. Pus coming from the ear. How is this diagnosed? This condition may be diagnosed by examining the ear and testing fluid from theear for bacteria and funguses. How is this treated? This condition may be treated with: Antibiotic ear drops. These are often given for 10-14 days. Medicines to reduce itching and swelling. Follow these instructions at home: If you were prescribed antibiotic ear drops, use them as told by your health care provider. Do not stop using the antibiotic even if your condition improves. Take over-the-counter and prescription medicines only as told by your health care  provider. Avoid getting water in your ears as told by your health care provider. This may include avoiding swimming or water sports for a few days. Keep all follow-up visits as told by your health care provider. This is important. How is this prevented? Keep your ears dry. Use the corner of a towel to dry your ears after you swim or bathe. Avoid scratching or putting things in your ear. Doing these things can damage the ear canal or remove the protective wax that lines it, which makes it easier for bacteria and funguses to grow. Avoid swimming in lakes, polluted water, or pools that may not have enough chlorine. Contact a health care provider if: You have a fever. Your ear is still red, swollen, painful, or draining pus after 3 days. Your redness, swelling, or pain gets worse. You have a severe headache. You have redness, swelling, pain, or tenderness in the area behind your ear. Summary Otitis externa is an infection of the outer ear canal. Common causes include swimming in dirty water, moisture in the ear, or a cut or scrape in the ear. Symptoms include pain, redness, and swelling of the ear. If you were prescribed antibiotic ear drops, use them as told by your health care provider. Do not stop using the antibiotic even if your condition improves. This information is not intended to replace advice given to you by your health care provider. Make sure you discuss any questions you have with your healthcare provider. Document Revised: 04/22/2018 Document Reviewed: 04/23/2018 Elsevier Patient  Education  2022 [employer].

## 2021-06-14 ENCOUNTER — Telehealth: Payer: Self-pay

## 2021-06-14 ENCOUNTER — Encounter: Payer: Self-pay | Admitting: Family Medicine

## 2021-06-14 DIAGNOSIS — Z01118 Encounter for examination of ears and hearing with other abnormal findings: Secondary | ICD-10-CM | POA: Insufficient documentation

## 2021-06-14 DIAGNOSIS — H60501 Unspecified acute noninfective otitis externa, right ear: Secondary | ICD-10-CM

## 2021-06-14 MED ORDER — NEOMYCIN-POLYMYXIN-DEXAMETH 3.5-10000-0.1 OP SUSP: OPHTHALMIC | 0 refills | Status: DC

## 2021-06-14 NOTE — Assessment & Plan Note (Signed)
Hearing Screening  Method: Audiometry   500Hz  1000Hz  2000Hz  4000Hz   Right ear 40 40 40 Fail  Left ear 40 40 Fail Fail   Some high frequency hearing loss bilaterally.  Would recheck when patient's AOM and otitis externa has resolved.

## 2021-06-14 NOTE — Telephone Encounter (Signed)
Pharmacy calls nurse line reporting they do not have neomycin-polymyxin-hydrocortisone (CORTISPORIN) OTIC solution in stock. Pharmacy is requesting a change to Maxitrol. Please advise if change is appropriate.

## 2021-06-14 NOTE — Progress Notes (Signed)
Diana [name] is alone Sources of clinical information for visit is/are patient and past medical records. Nursing assessment for this office visit was reviewed with the patient for accuracy and revision.     Previous Report(s) Reviewed: office notes  Depression screen [facility] 2/9 12/05/2020  Decreased Interest 0  Down, Depressed, Hopeless 0  PHQ - 2 Score 0  Altered sleeping 1  Tired, decreased energy 0  Change in appetite 0  Feeling bad or failure about yourself  0  Trouble concentrating 1  Moving slowly or fidgety/restless 0  Suicidal thoughts 0  PHQ-9 Score 2  Some recent data might be hidden    Fall Risk  07/26/2020 03/20/2020 01/13/2020 04/07/2019 02/07/2019  Falls in the past year? 1 0 0 0 1  Number falls in past yr: 0 - 0 0 0  Injury with Fall? 0 - - - 0  Comment - - - - only bump and bruised  Risk for fall due to : - - - - Other (Comment)  Risk for fall due to: Comment - - - - pt tripped over pipe  Follow up Falls evaluation completed - Falls evaluation completed Falls evaluation completed Falls evaluation completed    PHQ9 SCORE ONLY 12/05/2020 10/29/2020 09/27/2020  PHQ-9 Total Score 2 1 1     Adult vaccines due  Topic Date Due   TETANUS/TDAP  03/22/2023    Health Maintenance Due  Topic Date Due   Zoster Vaccines- Shingrix (1 of 2) Never done   MAMMOGRAM  04/08/2014   Pneumococcal Vaccine 14-19 Years old (2 - PCV) 08/18/2014   COVID-19 Vaccine (4 - Booster for Moderna series) 03/02/2021      History/P.E. limitations: none  Adult vaccines due  Topic Date Due   TETANUS/TDAP  03/22/2023   There are no preventive care reminders to display for this patient.  Health Maintenance Due  Topic Date Due   Zoster Vaccines- Shingrix (1 of 2) Never done   MAMMOGRAM  04/08/2014   Pneumococcal Vaccine 20-7 Years old (2 - PCV) 08/18/2014   COVID-19 Vaccine (4 - Booster for Moderna series) 03/02/2021     Chief Complaint  Patient presents with   Ear Pain    Right ear pain      Visit Problem List with A/P  Otitis externa of right ear New problem Onset: ~ 5 days ago Location: right ear Severity: very painful Pattern: constant pain Relief: little relief with NSAID and APAP Precipitant: No swimming, no water in ear  Prior Diagnostic Testing or Treatments: Ms Capitano was seen at an urgent care (outside [place]) who appear to have diagnosed a AOM and started Z-Pak.  She has taken three days of the Z-Pak.  She continues to have the right ear pain despite the antibiotic.   PE Gen: mild distress HEENT: Right ear:  Tenderness with manipulation of right pinna and press on tragus.  EAC erythematous walls inferoposteriorly.  Pain with insertion of ear speculum.  Unable to see entire TM bc thin hanging yellow -brown material obscuring view.  Attempt to remove with ear currette caused too much pain to allow successful removal.  Visible inferior TM without abnormal appearance.  Left EAC within normal limits, Left TM retracted and translucent  A/P Right Ear Otitis Externa - Initial Rx with cortisporin otic, but unavailable at patient's pharmacy, so substituted ophth liquid Maxitrol (neomycin-polymyxin-DXM) 3 drops every 4 hours x 7 days.  Also prescribed Pramoxine-chloroxylenol liquid drop to use in ear to provide some  level of topical anesthesia.  Finally, Rx hydrocodone-APAP 5-325 1 tab q6h prn pain - Disp #20 RF 0.   - Asked patient to let us know if she is not improving by 7/18 or if worsens.  - Asked patient to complete her Z-Pak therapy started for an AOM by an UC.      Encounter for hearing screening with abnormal findings Hearing Screening  Method: Audiometry   500Hz  1000Hz  2000Hz  4000Hz   Right ear 40 40 40 Fail  Left ear 40 40 Fail Fail   Some high frequency hearing loss bilaterally.  Would recheck when patient's AOM and otitis externa has resolved.

## 2021-06-14 NOTE — Assessment & Plan Note (Addendum)
New problem Onset: ~ 5 days ago Location: right ear Severity: very painful Pattern: constant pain Relief: little relief with NSAID and APAP Precipitant: No swimming, no water in ear  Prior Diagnostic Testing or Treatments: Ms Pell was seen at an urgent care (outside [place]) who appear to have diagnosed a AOM and started Z-Pak.  She has taken three days of the Z-Pak.  She continues to have the right ear pain despite the antibiotic.   PE Gen: mild distress HEENT: Right ear:  Tenderness with manipulation of right pinna and press on tragus.  EAC erythematous walls inferoposteriorly.  Pain with insertion of ear speculum.  Unable to see entire TM bc thin hanging yellow -brown material obscuring view.  Attempt to remove with ear currette caused too much pain to allow successful removal.  Visible inferior TM without abnormal appearance.  Left EAC within normal limits, Left TM retracted and translucent  A/P Right Ear Otitis Externa - Initial Rx with cortisporin otic, but unavailable at patient's pharmacy, so substituted ophth liquid Maxitrol (neomycin-polymyxin-DXM) 3 drops every 4 hours x 7 days.  Also prescribed Pramoxine-chloroxylenol liquid drop to use in ear to provide some level of topical anesthesia.  Finally, Rx hydrocodone-APAP 5-325 1 tab q6h prn pain - Disp #20 RF 0.   - Asked patient to let us know if she is not improving by 7/18 or if worsens.  - Asked patient to complete her Z-Pak therapy started for an AOM by an UC.

## 2021-06-14 NOTE — Telephone Encounter (Signed)
Order for Maxitrol sent to pharmacy

## 2021-06-18 ENCOUNTER — Other Ambulatory Visit: Payer: Self-pay | Admitting: Family Medicine

## 2021-06-18 DIAGNOSIS — M109 Gout, unspecified: Secondary | ICD-10-CM

## 2021-06-20 ENCOUNTER — Other Ambulatory Visit: Payer: Self-pay

## 2021-06-20 ENCOUNTER — Ambulatory Visit: Payer: Medicaid Other | Admitting: Pharmacist Clinician (PhC)/ Clinical Pharmacy Specialist

## 2021-06-20 DIAGNOSIS — E7801 Familial hypercholesterolemia: Secondary | ICD-10-CM

## 2021-06-20 DIAGNOSIS — I10 Essential (primary) hypertension: Secondary | ICD-10-CM | POA: Diagnosis not present

## 2021-06-20 MED ORDER — OLMESARTAN MEDOXOMIL 20 MG PO TABS: 20.0000 mg | ORAL_TABLET | Freq: Every day | ORAL | 6 refills | Status: DC

## 2021-06-20 NOTE — Patient Instructions (Signed)
Check your blood pressure at home daily and keep record of the readings.  After 2-3 weeks on the Olmesartan 20 mg, please call and let us know what the home blood pressure readings are.  At that time we can decide if we need the 20 mg or 40 mg dose.  Call [place] at [phone] to report your home readings.  Take your BP meds as follows:  STOP AMLODIPINE  STOP LOSARTAN  START OLMESARTAN 20 MG ONCE DAILY   Haliegh will get the prior authorization from your insurance company for the [place].  Once approved she will call you and let you know that it's available.     HOW TO TAKE YOUR BLOOD PRESSURE: Rest 5 minutes before taking your blood pressure.  Don't smoke or drink caffeinated beverages for at least 30 minutes before. Take your blood pressure before (not after) you eat. Sit comfortably with your back supported and both feet on the floor (don't cross your legs). Elevate your arm to heart level on a table or a desk. Use the proper sized cuff. It should fit smoothly and snugly around your bare upper arm. There should be enough room to slip a fingertip under the cuff. The bottom edge of the cuff should be 1 inch above the crease of the elbow. Ideally, take 3 measurements at one sitting and record the average.     Diana [name]

## 2021-06-20 NOTE — Assessment & Plan Note (Signed)
Patient with essential hypertension, states home BP readings all WNL, however levated in office today.  She is trying to minimize medications, so will have her stop the amlodipine and losartan and instead take olmesartan 20 mg daily.   She will need to continue with home blood pressure check and was asked to call in 2-3 weeks and report home readings.  Explained that we may need to go to 40 mg dose for best control.

## 2021-06-20 NOTE — Assessment & Plan Note (Signed)
Patient now on Livalo 1 mg twice weekly.  Still having some problems with myalgias, so will have her cut back to 1 mg each Monday only.  She did well with Praluent, so will get PA to cover this and have her continue with Praluent 150 mg every 14 days.  She will need to repeat lipid and hepatic panels after 2 months of use.

## 2021-06-20 NOTE — Progress Notes (Signed)
06/20/2021 Diana [name] 03-Dec-1955 [id number]   HPI:  Diana [name] is a 65 y.o. female patient of Dr Harriet Masson, who presents today for a lipid clinic evaluation.  See pertinent past medical history below.  She was seen in [facility] last fall for lipid lowering management.  At that time she chose to go on [employer].  Her baseline LDL (from 2016) was noted to be 214.  She has been intolerant to several statin drugs (see below) and did well with the Repatha for the first month or so.  She then began to develop muscle aches and cramping/spasms.  She switched to the Pushtronix, hoping the less frequent injections would be better, but unfortunately that has not been the case.  She continues to use the medication (last dose June 2), but would like to discuss other options.  She also wonders if any of her other medications could be causing the muscle cramping/spasms.  Despite compliance with the PCSK-9 inhibitor, her LDL cholesterol has dropped only to 114.  After the most recent lab, Dr. Harriet Masson added Livalo 1 mg twice weekly.  Unfortunately she mis-read the instructions and has been taking twice daily for about a week now.  She admits the muscle cramping, which has never gone away, has been worse this past week.   She returns today for follow up.  States that the cramping from [place] went away when she stopped.  Restarted after 10 days and admits to some cramping, but not too severe.  We also had decreased her amlodipine and increased losartan.  States that she has done well with this change and all home BP readings were < 505 systolic.  Would like to stop amlodipine altogether.     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, carotid stenosis (R ICA 60-79%, R ECA > 50%, L ICA 60-79%, L ECA > 50%)  hypertension 150/94 at last visit - on amlodipine 2.5, losartan 25  migraines Uses Excedrin Migraine, Imitrex tabs    Current Medications: none  Cholesterol Goals: LDL < 70 Blood Pressure goal:  <  130/80   Intolerant/previously tried: pravastatin, simvastatin, atorvastatin, rosuvastatin  Family history: father in 25's with first stroke, and multiple TIA, MI in 76's, multiple TIA's through his 63's died at 63 from stroke; mother with AF, in 10's, died from stroke recently at 57; siblings seem to be healthy; 2 children - healthy  Diet: mix of home and eating out.  Most eating out is home-style with lots of vegetables; [place] and chicken mostly for protein  no fried foods   Exercise:  does in home core training, hoping to get back into pilates  Labs: 6/22:  TC 204, TG 119, HDL 69, LDL 114  Baseline LDL from 2016  - 214  Home BP readings: cuff (arm) about 63 years old   Current Outpatient Medications  Medication Sig Dispense Refill   albuterol (PROVENTIL HFA;VENTOLIN HFA) 108 (90 Base) MCG/ACT inhaler Inhale 2 puffs into the lungs every 6 (six) hours as needed for wheezing. 8.5 g 5   Alirocumab (PRALUENT) 150 MG/ML SOAJ Inject 150 mg into the skin every 14 (fourteen) days. 1 mL 0   allopurinol (ZYLOPRIM) 100 MG tablet TAKE 1 TABLET BY MOUTH DAILY 30 tablet 0   amLODipine (NORVASC) 5 MG tablet Take 2.5 mg by mouth daily.     aspirin-acetaminophen-caffeine (EXCEDRIN MIGRAINE) 250-250-65 MG tablet Take 2 tablets by mouth every 6 (six) hours as needed for headache.  Calcium Carbonate-Vitamin D 600-200 MG-UNIT TABS 1 tablet.     co-enzyme Q-10 30 MG capsule Take 100 mg by mouth daily.     diclofenac sodium (VOLTAREN) 1 % GEL Apply 2 g topically 4 (four) times daily as needed. 100 g 5   esomeprazole (NEXIUM) 40 MG capsule TAKE 1 CAPSULE BY MOUTH DAILY AT 12 NOON 90 capsule 1   HYDROcodone-acetaminophen (NORCO) 5-325 MG tablet Take 1 tablet by mouth every 6 (six) hours as needed for moderate pain. 20 tablet 0   LINZESS 145 MCG CAPS capsule TAKE 1 CAPSULE BY MOUTH DAILY BEFORE BREAKFAST 30 capsule 5   neomycin-polymyxin b-dexamethasone (MAXITROL) 3.5-10000-0.1 SUSP 3 drops into right ear  every 4 hours for 7 days. 10 mL 0   olmesartan (BENICAR) 20 MG tablet Take 1 tablet (20 mg total) by mouth daily. 30 tablet 6   Omega-3 Fatty Acids (OMEGA III EPA+DHA PO) Take 1 capsule by mouth in the morning and at bedtime.     oxyCODONE (OXY IR/ROXICODONE) 5 MG immediate release tablet 5 mg.     Pitavastatin Calcium 1 MG TABS Take 1 tablet (1 mg total) by mouth 2 (two) times a week. 30 tablet 6   Pramoxine-Chloroxylenol 1-0.1 % LIQD Place 1 drop in ear(s) 4 (four) times daily as needed. 10 mL 0   Prenatal Vit-Fe Fumarate-FA (PRENATAL MULTIVITAMIN) TABS tablet Take 1 tablet by mouth daily at 12 noon.     SUMAtriptan (IMITREX) 50 MG tablet Take 1 tablet (50 mg total) by mouth every 2 (two) hours as needed for migraine. May repeat in 2 hours if headache persists or recurs. 10 tablet 3   Tiotropium Bromide-Olodaterol (STIOLTO RESPIMAT) 2.5-2.5 MCG/ACT AERS Inhale 2 puffs into the lungs daily. 4 g 3   Turmeric (QC TUMERIC COMPLEX) 500 MG CAPS Take 2 tablets by mouth daily.     venlafaxine XR (EFFEXOR-XR) 37.5 MG 24 hr capsule TAKE 1 CAPSULE BY MOUTH ONCE DAILY WITH BREAKFAST 30 capsule 3   No current facility-administered medications for this visit.    Allergies  Allergen Reactions   Penicillins Anaphylaxis   Demerol [Meperidine] Nausea And Vomiting   Doxycycline Hyclate Nausea And Vomiting   Gabapentin Other (See Comments)    Malaise   Sulfa Antibiotics Hives   Atorvastatin     myalgias   Fluvastatin     myalgia   Levaquin [Levofloxacin]     Reported some possible facial swelling with administration 05/2018   Lovastatin     myalgia   No Known Allergies     Other reaction(s): Dizziness   Pitavastatin     myalgia   Pravastatin     myalgia   Rosuvastatin     myalgia   Simvastatin     myalgia   Benadryl [Diphenhydramine Hcl] Palpitations and Rash   Buprenorphine Hcl Nausea And Vomiting   Cephalexin Rash   Morphine And Related Nausea And Vomiting   Spiriva Handihaler [Tiotropium  Bromide Monohydrate] Rash    Past Medical History:  Diagnosis Date   Acute gout involving toe of left foot 10/12/2019   Anxiety    Asthma    Barrett esophagus    "don't know that I've ever been stretched" (08/17/2013)   Bilateral leg edema 07/16/2020   Choledocholithiasis with acute cholecystitis    Chronic lower back pain    COPD (chronic obstructive pulmonary disease) ([place])    Craniosynostosis    congenital   DDD (degenerative disc disease)    Family history of  anesthesia complication    "PONV; both parents" (08/17/2013)   GERD (gastroesophageal reflux disease)    H/O hiatal hernia    H/O: hysterectomy 09/22/2014   By her report at age 33. Has never been on Norma replacement therapy. 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 Switched to lovastatin b/c its $4.     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 We'll try adding estradiol cream 3 times a week. I would give this for 8 weeks and she's not having some improvement, I would have her return to clinic. If she is having improvement she can decrease the frequency of her applications to once or twice a week. If she's not had improvement then I would consider increasing him to 7 days a week. 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 rate 16, height 5\' 1"  (1.549 m), weight 148 lb 6.4 oz (67.3 kg), SpO2 94 %.   Hypertension, essential, benign Patient with essential hypertension, states home BP readings all WNL, however levated in office today.  She is trying to minimize medications, so will have her stop the amlodipine and losartan and instead take olmesartan 20 mg daily.   She will need to continue with home blood pressure check and was asked to call in 2-3 weeks and report home readings.  Explained that we may need to go to 40 mg dose for best control.    Hyperlipidemia Patient now on Livalo 1 mg twice weekly.  Still having some problems with myalgias, so will have her cut back to 1 mg each Monday only.  She did well with Praluent, so will get PA to cover this and have her continue with Praluent 150 mg every 14 days.  She will need to repeat lipid and hepatic panels after 2 months of use.     Tommy Medal PharmD CPP [facility] [address] [place], [place] [address] [phone]

## 2021-06-24 ENCOUNTER — Telehealth: Payer: Self-pay

## 2021-06-24 DIAGNOSIS — E7801 Familial hypercholesterolemia: Secondary | ICD-10-CM

## 2021-06-24 MED ORDER — PRALUENT 75 MG/ML ~~LOC~~ SOAJ
75.0000 mg | SUBCUTANEOUS | 11 refills | Status: DC
Start: 2021-06-24 — End: 2021-09-27

## 2021-06-24 NOTE — Telephone Encounter (Signed)
Called and spoke w/pt and stated that they were approved for praluent '75mg'$ . Rx sent, instructed pt to complete fasting labs post 4th dose, and to call if they have cost issues, pt voiced understanding.

## 2021-07-16 ENCOUNTER — Other Ambulatory Visit: Payer: Self-pay | Admitting: Family Medicine

## 2021-07-16 DIAGNOSIS — M109 Gout, unspecified: Secondary | ICD-10-CM

## 2021-07-17 ENCOUNTER — Other Ambulatory Visit: Payer: Self-pay | Admitting: Family Medicine

## 2021-07-22 ENCOUNTER — Ambulatory Visit (INDEPENDENT_AMBULATORY_CARE_PROVIDER_SITE_OTHER): Payer: Medicaid Other | Admitting: Family Medicine

## 2021-07-22 VITALS — BP 102/64 | HR 102

## 2021-07-22 DIAGNOSIS — J449 Chronic obstructive pulmonary disease, unspecified: Secondary | ICD-10-CM

## 2021-07-22 DIAGNOSIS — R0602 Shortness of breath: Secondary | ICD-10-CM

## 2021-07-22 MED ORDER — PREDNISONE 20 MG PO TABS: 40.0000 mg | ORAL_TABLET | Freq: Every day | ORAL | 0 refills | Status: DC

## 2021-07-22 MED ORDER — ALBUTEROL SULFATE HFA 108 (90 BASE) MCG/ACT IN AERS: 2.0000 | INHALATION_SPRAY | Freq: Four times a day (QID) | RESPIRATORY_TRACT | 5 refills | Status: DC | PRN

## 2021-07-22 NOTE — Patient Instructions (Addendum)
It was a pleasure seeing you today.  I think this is most likely a COPD exacerbation but we will check for COVID as well.  I am going to send in a prescription for prednisone 40 mg daily for 5 days to your pharmacy.  If your work of breathing increases please be reevaluated.  If you have any questions or concerns please let me know.  I hope you have a wonderful afternoon!

## 2021-07-22 NOTE — Progress Notes (Signed)
    SUBJECTIVE:   CHIEF COMPLAINT / HPI:   Shortness of breath Patient with 4-day history of shortness of breath.  She has history of COPD so has chronic cough but feels that the cough is also worsened over the last 4 days.  Reports that she took an at home COVID test yesterday which came back negative but her daughter was concerned that it was a false negative so wanted her evaluated.  She is currently smoking but is down to half pack a day from 2 packs a day.  Has been hospitalized on several occasions for COPD exacerbations.  Denies any congestion or mucus production.  OBJECTIVE:   BP 102/64   Pulse (!) 102   SpO2 96%   General: Tired appearing 65 year old female, no acute distress Cardiac: Regular rate and rhythm, no murmurs appreciated Respiratory: Increased work of breathing, persistent cough, prolonged expiratory phase Abdomen: Soft, nontender, positive bowel sounds  ASSESSMENT/PLAN:   SOB (shortness of breath) Patient with 4-day history of worsening shortness of breath.  History of COPD.  Differential includes viral URI versus COPD exacerbation.  Patient has negative at home COVID test but will repeat at today's visit.  Feel that this is most likely a COPD exacerbation and patient does not have increased sputum or mucus production so we will treat with 5 days of prednisone 40 mg daily.  also refilled patient's albuterol.  Strict return and ED precautions given and patient is agreeable to this.     Gifford Shave, MD [place]

## 2021-07-23 DIAGNOSIS — R0602 Shortness of breath: Secondary | ICD-10-CM | POA: Insufficient documentation

## 2021-07-23 LAB — NOVEL CORONAVIRUS, NAA: SARS-CoV-2, NAA: NOT DETECTED

## 2021-07-23 LAB — SARS-COV-2, NAA 2 DAY TAT

## 2021-07-23 NOTE — Assessment & Plan Note (Signed)
Patient with 4-day history of worsening shortness of breath.  History of COPD.  Differential includes viral URI versus COPD exacerbation.  Patient has negative at home COVID test but will repeat at today's visit.  Feel that this is most likely a COPD exacerbation and patient does not have increased sputum or mucus production so we will treat with 5 days of prednisone 40 mg daily.  also refilled patient's albuterol.  Strict return and ED precautions given and patient is agreeable to this.

## 2021-07-25 ENCOUNTER — Other Ambulatory Visit: Payer: Self-pay | Admitting: Family Medicine

## 2021-07-26 ENCOUNTER — Other Ambulatory Visit: Payer: Self-pay | Admitting: Family Medicine

## 2021-07-26 NOTE — Telephone Encounter (Signed)
Prescription request from pharmacy for amlodipine 5 mg.  Per cardiology pharmacy last note patient was to discontinue amlodipine.  Call patient to confirm and patient does not currently have amlodipine in her medication, and is no longer needed it.  We will refuse the medication request at this time.  Babe Anthis, DO

## 2021-07-31 ENCOUNTER — Other Ambulatory Visit: Payer: Self-pay | Admitting: Family Medicine

## 2021-07-31 DIAGNOSIS — G43709 Chronic migraine without aura, not intractable, without status migrainosus: Secondary | ICD-10-CM | POA: Diagnosis not present

## 2021-07-31 DIAGNOSIS — H6501 Acute serous otitis media, right ear: Secondary | ICD-10-CM | POA: Diagnosis not present

## 2021-07-31 NOTE — Telephone Encounter (Signed)
Refused refill request for amlodipine as patient is no longer taking this medication. HTN meds were changed in July 2022 when she was seen in lipid clinic.

## 2021-08-14 ENCOUNTER — Other Ambulatory Visit: Payer: Self-pay | Admitting: Family Medicine

## 2021-08-14 DIAGNOSIS — M109 Gout, unspecified: Secondary | ICD-10-CM

## 2021-08-23 ENCOUNTER — Other Ambulatory Visit: Payer: Self-pay

## 2021-08-23 ENCOUNTER — Ambulatory Visit
Admission: RE | Admit: 2021-08-23 | Discharge: 2021-08-23 | Disposition: A | Discharge status: Home / Self Care | Payer: Medicaid Other | Source: Ambulatory Visit | Attending: Family Medicine | Admitting: Family Medicine

## 2021-08-23 ENCOUNTER — Ambulatory Visit (INDEPENDENT_AMBULATORY_CARE_PROVIDER_SITE_OTHER): Payer: Medicaid Other | Admitting: Family Medicine

## 2021-08-23 ENCOUNTER — Encounter: Payer: Self-pay | Admitting: Family Medicine

## 2021-08-23 ENCOUNTER — Other Ambulatory Visit: Payer: Self-pay | Admitting: Family Medicine

## 2021-08-23 VITALS — BP 118/76 | HR 97 | Ht 61.0 in | Wt 148.4 lb

## 2021-08-23 DIAGNOSIS — E7801 Familial hypercholesterolemia: Secondary | ICD-10-CM

## 2021-08-23 DIAGNOSIS — Z720 Tobacco use: Secondary | ICD-10-CM

## 2021-08-23 DIAGNOSIS — Z23 Encounter for immunization: Secondary | ICD-10-CM

## 2021-08-23 DIAGNOSIS — R0602 Shortness of breath: Secondary | ICD-10-CM | POA: Diagnosis not present

## 2021-08-23 DIAGNOSIS — J449 Chronic obstructive pulmonary disease, unspecified: Secondary | ICD-10-CM | POA: Diagnosis not present

## 2021-08-23 DIAGNOSIS — F419 Anxiety disorder, unspecified: Secondary | ICD-10-CM

## 2021-08-23 DIAGNOSIS — L570 Actinic keratosis: Secondary | ICD-10-CM

## 2021-08-23 DIAGNOSIS — Z0000 Encounter for general adult medical examination without abnormal findings: Secondary | ICD-10-CM

## 2021-08-23 DIAGNOSIS — R059 Cough, unspecified: Secondary | ICD-10-CM | POA: Diagnosis not present

## 2021-08-23 MED ORDER — ZOSTER VAC RECOMB ADJUVANTED 50 MCG/0.5ML IM SUSR: 0.5000 mL | Freq: Once | INTRAMUSCULAR | 0 refills | Status: AC

## 2021-08-23 MED ORDER — FLUTICASONE PROPIONATE HFA 220 MCG/ACT IN AERO: 2.0000 | INHALATION_SPRAY | Freq: Two times a day (BID) | RESPIRATORY_TRACT | 2 refills | Status: DC

## 2021-08-23 NOTE — Progress Notes (Signed)
SUBJECTIVE:   Chief complaint/HPI: annual examination  Diana [name] is a 65 y.o. who presents today for an annual exam.   Current concerns:  Cough, Shortness of Breath: She reports very frequent cough, day and night. It interferes with her sleep. People often ask if she's ok due to her cough. Non-productive. This has been progressively worsening over the course of a few months. She also endorses worsening dyspnea on exertion. She has been using her albuterol inhaler twice daily for about 2 months now without relief. She currently smokes 1PPD (down from 2PPD, started at age 68, quit for 2 years on Chantix).  Takes Stiolto for COPD, had PFTs in 2015, had low dose chest CT in 2021 which was largely unremarkable other than mild emphysematous changes and 80mm subpleural nodule in RUL.  Spot on R cheek- red spot on her right cheek, present for months, she had spots frozen off her face in the past and this looks similar.   OBJECTIVE:   BP 118/76   Pulse 97   Ht 5\' 1"  (1.549 m)   Wt 148 lb 6.4 oz (67.3 kg)   SpO2 99%   BMI 28.04 kg/m   General: NAD, pleasant, able to participate in exam Cardiac: RRR, S1 S2 present. normal heart sounds, no murmurs. Respiratory: CTAB, normal effort, No wheezes, rales or rhonchi Abdomen: Bowel sounds present, non-tender, non-distended, no hepatosplenomegaly Extremities: no edema or cyanosis. Skin: warm and dry, 101mm erythematous scaly papule on right mid cheek Neuro: alert, no obvious focal deficits Psych: Normal affect and mood  PROCEDURE: Cryotherapy       The area surrounding the skin lesion was prepared in the usual sterile manner. The cryotherapy gun was then applied for 3 seconds until an ice ball formed with a 5-7 mm border.  This was allowed to thaw and then the cryotherapy was again applied for 3 seconds to an ice ball of 5-7 mm.   The patient tolerated the procedure well.  Return precautions provided.    ASSESSMENT/PLAN:   Cough, shortness  of breath Patient with several months of worsening cough and dyspnea on exertion. Recently treated with 5 day course of Prednisone without relief. Suspect this may represent poorly controlled vs worsening COPD, especially given her ongoing tobacco use. Will step-up her therapy and add ICS. Differential also includes underlying malignancy, pulmonary fibrosis, or other pulmonary abnormality. Less likely GERD or cardiac related at this time.  -Continue Stiolto -Start Flovent -Continue Albuterol as needed, ideally at reduced frequency -Due for low dose lung CT screening (see below) -Will obtain CXR in the meantime -Consider pulm referral if no improvement  Tobacco abuse Currently smoking 1 PPD. Has >40 year pack history. Brief cessation counseling provided today but unfortunately patient is not ready at this time. Advised to contact our office when she's ready to quit. Obtain low dose chest CT as below  Actinic keratosis 69mm actinic keratosis noted on R cheek. Treated with cryotherapy today.  Annual Examination, Health Maintenance -PHQ9 score 11, reviewed-- no SI. Scored high in part due to sleep difficulty (related to cough) and fatigue (also pulm related). Continue to monitor -BP reviewed and at goal. -Lipid panel obtained today by her cardiologist, results pending. -Low dose chest CT ordered for lung cancer screening -Patient due for mammogram. She plans to have this done in [place].  -Flu vaccine given today -Patient provided printed Rx to obtain Shingrix at her [place] of health maintenance items up to date  Alcus Dad, MD [place]

## 2021-08-23 NOTE — Patient Instructions (Addendum)
It was great to meet you!  Things we discussed at today's visit: - I have ordered a chest x-ray to evaluate your lungs. Please stop at [facility] today to have this done (no appointment needed) - I have also ordered a CT of your chest. Someone will call you to schedule this appointment. It can take up to several weeks to be approved by insurance. - In the meantime, we have added an inhaler for your COPD. This is twice daily IN ADDITION to your Stiolto.  - You can get your mammogram done in [place]. Have them fax me the results. - You can get your shingles vaccine at any pharmacy - The absolute best thing you can do for your health and quality of life is to quit smoking. Please let us know when you're ready and we will work together.   Take care and seek immediate care sooner if you develop any concerns.  Dr. [facility]

## 2021-08-23 NOTE — Progress Notes (Signed)
[facility]

## 2021-08-24 LAB — HEPATIC FUNCTION PANEL
ALT: 15 IU/L (ref 0–32)
AST: 20 IU/L (ref 0–40)
Albumin: 4.2 g/dL (ref 3.8–4.8)
Alkaline Phosphatase: 107 IU/L (ref 44–121)
Bilirubin Total: 0.4 mg/dL (ref 0.0–1.2)
Bilirubin, Direct: 0.12 mg/dL (ref 0.00–0.40)
Total Protein: 6.2 g/dL (ref 6.0–8.5)

## 2021-08-24 LAB — LIPID PANEL
Chol/HDL Ratio: 3.2 ratio (ref 0.0–4.4)
Cholesterol, Total: 172 mg/dL (ref 100–199)
HDL: 54 mg/dL (ref >39)
LDL Chol Calc (NIH): 95 mg/dL (ref 0–99)
Triglycerides: 131 mg/dL (ref 0–149)
VLDL Cholesterol Cal: 23 mg/dL (ref 5–40)

## 2021-08-25 DIAGNOSIS — L570 Actinic keratosis: Secondary | ICD-10-CM | POA: Insufficient documentation

## 2021-08-25 NOTE — Assessment & Plan Note (Addendum)
Patient with several months of worsening cough and dyspnea on exertion. Recently treated with 5 day course of Prednisone without relief. Suspect this may represent poorly controlled vs worsening COPD, especially given her ongoing tobacco use. Will step-up her therapy and add ICS. Differential also includes underlying malignancy, pulmonary fibrosis, or other pulmonary abnormality. Less likely GERD or cardiac related at this time.  -Continue Stiolto -Start Flovent -Continue Albuterol as needed, ideally at reduced frequency -Due for low dose lung CT screening (see below) -Will obtain CXR in the meantime -Consider pulm referral if no improvement

## 2021-08-25 NOTE — Assessment & Plan Note (Signed)
Currently smoking 1 PPD. Has >40 year pack history. Brief cessation counseling provided today but unfortunately patient is not ready at this time. Advised to contact our office when she's ready to quit. Obtain low dose chest CT as below

## 2021-08-25 NOTE — Assessment & Plan Note (Addendum)
14mm actinic keratosis noted on R cheek. Treated with cryotherapy today.

## 2021-09-11 ENCOUNTER — Other Ambulatory Visit: Payer: Self-pay | Admitting: Family Medicine

## 2021-09-11 DIAGNOSIS — M109 Gout, unspecified: Secondary | ICD-10-CM

## 2021-09-27 ENCOUNTER — Other Ambulatory Visit: Payer: Self-pay

## 2021-09-27 ENCOUNTER — Telehealth: Payer: Self-pay | Admitting: Cardiology

## 2021-09-27 MED ORDER — PRALUENT 150 MG/ML ~~LOC~~ SOAJ: 150.0000 mg | SUBCUTANEOUS | 11 refills | Status: DC

## 2021-09-27 NOTE — Addendum Note (Signed)
Addended by: Allean Found on: 09/27/2021 02:33 PM   Modules accepted: Orders

## 2021-09-27 NOTE — Telephone Encounter (Signed)
Message sent to Pharmacy team.

## 2021-09-27 NOTE — Telephone Encounter (Signed)
New Message:    Patient says she needs the directions on how to use her Praluent?

## 2021-09-27 NOTE — Telephone Encounter (Signed)
Spoke with patient.  Should be on Praluent 150 mg q14d.  Was previously on Repatha 420 q30.   Unsure how directions got crossed.  Information corrected and sent to pharmacy.  Patient aware dose is 150 mg q14d

## 2021-09-30 ENCOUNTER — Other Ambulatory Visit: Payer: Self-pay | Admitting: Family Medicine

## 2021-09-30 DIAGNOSIS — Z1231 Encounter for screening mammogram for malignant neoplasm of breast: Secondary | ICD-10-CM

## 2021-10-01 ENCOUNTER — Other Ambulatory Visit: Payer: Self-pay

## 2021-10-01 ENCOUNTER — Ambulatory Visit
Admission: RE | Admit: 2021-10-01 | Discharge: 2021-10-01 | Disposition: A | Discharge status: Home / Self Care | Payer: Medicaid Other | Source: Ambulatory Visit | Attending: Family Medicine | Admitting: Family Medicine

## 2021-10-01 DIAGNOSIS — Z720 Tobacco use: Secondary | ICD-10-CM

## 2021-10-01 DIAGNOSIS — R0602 Shortness of breath: Secondary | ICD-10-CM

## 2021-10-01 DIAGNOSIS — Z0000 Encounter for general adult medical examination without abnormal findings: Secondary | ICD-10-CM

## 2021-10-07 ENCOUNTER — Other Ambulatory Visit: Payer: Self-pay | Admitting: Family Medicine

## 2021-10-07 DIAGNOSIS — J449 Chronic obstructive pulmonary disease, unspecified: Secondary | ICD-10-CM

## 2021-10-16 ENCOUNTER — Other Ambulatory Visit: Payer: Self-pay

## 2021-10-16 ENCOUNTER — Ambulatory Visit (INDEPENDENT_AMBULATORY_CARE_PROVIDER_SITE_OTHER): Payer: Medicaid Other | Admitting: Cardiology

## 2021-10-16 ENCOUNTER — Encounter: Payer: Self-pay | Admitting: Cardiology

## 2021-10-16 VITALS — BP 138/80 | HR 82 | Ht 61.0 in | Wt 149.6 lb

## 2021-10-16 DIAGNOSIS — E7849 Other hyperlipidemia: Secondary | ICD-10-CM | POA: Diagnosis not present

## 2021-10-16 DIAGNOSIS — I2510 Atherosclerotic heart disease of native coronary artery without angina pectoris: Secondary | ICD-10-CM

## 2021-10-16 DIAGNOSIS — E7801 Familial hypercholesterolemia: Secondary | ICD-10-CM

## 2021-10-16 DIAGNOSIS — E782 Mixed hyperlipidemia: Secondary | ICD-10-CM | POA: Diagnosis not present

## 2021-10-16 NOTE — Patient Instructions (Signed)

## 2021-10-17 NOTE — Progress Notes (Signed)
Cardiology Office Note:    Date:  10/17/2021   ID:  Diana [name], DOB 1956-10-26, MRN [id number]  PCP:  Alcus Dad, MD  Cardiologist:  Berniece Salines, DO  Electrophysiologist:  None   Referring MD: Alcus Dad, MD   " I am doing well"  History of Present Illness:    Diana [name] is a 65 y.o. female with a hx of  Hollenhorst plaque, hyperlipidemia, hypercholesterolemia, hypertension and tobacco abuse. I saw the patient on 02/10/2020 at that time we discussed her monitor result, and due to the evidence of second degree AV block, I stopped her beta-blocker and referred her to EP.  In the meantime we held her surgery once she was evaluated by EP.    Due to improvement of beta-blocker there is no indication for pacemaker.  Her blood work shows significant elevated LDL she was started on Livalo and Zetia.  I saw the patient March 20, 2020 at that time she was experiencing some leg swelling we therefore did a ultrasound duplex to rule out DVT.  We did discuss that we were going to repeat her lipid profile and then send the patient for PCSK9 inhibitors.  Since I saw the patient she has been following in our lipid clinic.  She was last seen in our lipid clinic on June 20, 2021.  Her blood pressure was elevated therefore her amlodipine and losartan.  She was placed on olmesartan.  Her lipid-lowering agent was also continued.  She offers no complaints at this time.  Past Medical History:  Diagnosis Date   Acute gout involving toe of left foot 10/12/2019   Anxiety    Asthma    Barrett esophagus    "don't know that I've ever been stretched" (08/17/2013)   Bilateral leg edema 07/16/2020   Choledocholithiasis with acute cholecystitis    Chronic lower back pain    COPD (chronic obstructive pulmonary disease) ([place])    Craniosynostosis    congenital   DDD (degenerative disc disease)    Family history of anesthesia complication    "PONV; both parents" (08/17/2013)   GERD (gastroesophageal  reflux disease)    H/O hiatal hernia    H/O: hysterectomy 09/22/2014   By her report at age 59. Has never been on Norma replacement therapy. 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 Switched to lovastatin b/c its $4.     Hypertension    Hyponatremia 08/28/2020   IBS (irritable bowel syndrome)    Kidney stones 07/22/2017   Migraines    "since age 46; probably twice/month" (08/17/2013)   PONV (postoperative nausea and vomiting)    Postmenopausal atrophic vaginitis 09/22/2014   History of hysterectomy. We'll try adding estradiol cream 3 times a week. I would give this for 8 weeks and she's not having some improvement, I would have her return to clinic. If she is having improvement she can decrease the frequency of her applications to once or twice a week. If she's not had improvement then I would consider increasing him to 7 days a week. 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  Shingles    Sinus pause 02/10/2020   Sleep apnea-like behavior 03/20/2020   Takes dietary supplements 07/28/2020    Past Surgical History:  Procedure Laterality Date   ABDOMINAL HYSTERECTOMY  1992   Total for chronic pelvic pain    APPENDECTOMY     BACK SURGERY  1990's   L4-L5 fusion @ [facility]  05/28/2019   fused C4-5-6, done in [place]  2007   Performed in [place]   "1st time got infected; 2nd time didn't take; fix the 3rd time" (08/17/2013)   POSTERIOR LUMBAR FUSION  1990's   "took piece off my hip" (08/17/2013)   TOTAL HIP ARTHROPLASTY Right  12/31/04   AVN after fall injury; performed by Ralene Cork; DePuy S-ROM total hip (metal on metal)   UPPER GASTROINTESTINAL ENDOSCOPY      Current Medications: Current Meds  Medication Sig   Alirocumab (PRALUENT) 150 MG/ML SOAJ Inject 150 mg into the skin every 14 (fourteen) days.   allopurinol (ZYLOPRIM) 100 MG tablet TAKE 1 TABLET BY MOUTH DAILY   amLODipine (NORVASC) 5 MG tablet Take 2.5 mg by mouth daily.   aspirin-acetaminophen-caffeine (EXCEDRIN MIGRAINE) 250-250-65 MG tablet Take 2 tablets by mouth every 6 (six) hours as needed for headache.   Calcium Carbonate-Vitamin D 600-200 MG-UNIT TABS 1 tablet.   co-enzyme Q-10 30 MG capsule Take 100 mg by mouth daily.   diclofenac sodium (VOLTAREN) 1 % GEL Apply 2 g topically 4 (four) times daily as needed.   esomeprazole (NEXIUM) 40 MG capsule TAKE 1 CAPSULE BY MOUTH DAILY AT 12 NOON   fluticasone (FLOVENT HFA) 220 MCG/ACT inhaler Inhale 2 puffs into the lungs 2 (two) times daily.   HYDROcodone-acetaminophen (NORCO) 5-325 MG tablet Take 1 tablet by mouth every 6 (six) hours as needed for moderate pain.   LINZESS 145 MCG CAPS capsule TAKE 1 CAPSULE BY MOUTH DAILY BEFORE BREAKFAST   neomycin-polymyxin b-dexamethasone (MAXITROL) 3.5-10000-0.1 SUSP 3 drops into right ear every 4 hours for 7 days.   olmesartan (BENICAR) 20 MG tablet Take 1 tablet (20 mg total) by mouth daily.   Omega-3 Fatty Acids (OMEGA III EPA+DHA PO) Take 1 capsule by mouth in the morning and at bedtime.   oxyCODONE (OXY IR/ROXICODONE) 5 MG immediate release tablet 5 mg.   Pitavastatin Calcium 1 MG TABS Take 1 tablet (1 mg total) by mouth 2 (two) times a week.   Prenatal Vit-Fe Fumarate-FA (PRENATAL MULTIVITAMIN) TABS tablet Take 1 tablet by mouth daily at 12 noon.   STIOLTO RESPIMAT 2.5-2.5 MCG/ACT AERS INHALE 2 PUFFS INTO THE LUNGS DAILY   SUMAtriptan (IMITREX) 50 MG tablet Take 1 tablet (50 mg total) by mouth every 2 (two) hours as needed for migraine. May repeat in 2 hours  if headache persists or recurs.   Turmeric 500 MG CAPS Take 2 tablets by mouth daily.   venlafaxine XR (EFFEXOR-XR) 37.5 MG 24 hr capsule TAKE 1 CAPSULE BY MOUTH ONCE DAILY WITH BREAKFAST   VENTOLIN HFA 108 (90 Base) MCG/ACT inhaler INHALE 2 PUFFS INTO THE LUNGS EVERY 6 HOURS AS NEEDED FOR WHEEZING     Allergies:   Penicillins, Demerol [meperidine], Doxycycline hyclate, Gabapentin, Sulfa antibiotics, Atorvastatin, Fluvastatin, Levaquin [levofloxacin], Lovastatin, No known allergies, Pitavastatin, Pravastatin, Rosuvastatin, Simvastatin, Benadryl [diphenhydramine hcl], Buprenorphine hcl, Cephalexin, Morphine and related, and Spiriva handihaler [tiotropium bromide monohydrate]   Social History  Socioeconomic History   Marital status: Divorced    Spouse name: Not on file   Number of children: 2   Years of education: Not on file   Highest education level: Not on file  Occupational History   Occupation: retired  Tobacco Use   Smoking status: Every Day    Packs/day: 0.50    Years: 32.00    Pack years: 16.00    Types: Cigarettes   Smokeless tobacco: Never   Tobacco comments:    on chanix  Vaping Use   Vaping Use: Never used  Substance and Sexual Activity   Alcohol use: Yes    Alcohol/week: 0.0 standard drinks    Comment: occasionally   Drug use: No   Sexual activity: Not Currently  Other Topics Concern   Not on file  Social History Narrative   Not on file   Social Determinants of Health   Financial Resource Strain: Not on file  Food Insecurity: Not on file  Transportation Needs: Not on file  Physical Activity: Not on file  Stress: Not on file  Social Connections: Not on file     Family History: The patient's family history includes Colon cancer in her paternal grandfather; Hypertension in her father and mother; Skin cancer in her father. There is no history of Rectal cancer, Stomach cancer, or Esophageal cancer.  ROS:   Review of Systems  Constitution: Negative for  decreased appetite, fever and weight gain.  HENT: Negative for congestion, ear discharge, hoarse voice and sore throat.   Eyes: Negative for discharge, redness, vision loss in right eye and visual halos.  Cardiovascular: Negative for chest pain, dyspnea on exertion, leg swelling, orthopnea and palpitations.  Respiratory: Negative for cough, hemoptysis, shortness of breath and snoring.   Endocrine: Negative for heat intolerance and polyphagia.  Hematologic/Lymphatic: Negative for bleeding problem. Does not bruise/bleed easily.  Skin: Negative for flushing, nail changes, rash and suspicious lesions.  Musculoskeletal: Negative for arthritis, joint pain, muscle cramps, myalgias, neck pain and stiffness.  Gastrointestinal: Negative for abdominal pain, bowel incontinence, diarrhea and excessive appetite.  Genitourinary: Negative for decreased libido, genital sores and incomplete emptying.  Neurological: Negative for brief paralysis, focal weakness, headaches and loss of balance.  Psychiatric/Behavioral: Negative for altered mental status, depression and suicidal ideas.  Allergic/Immunologic: Negative for HIV exposure and persistent infections.    EKGs/Labs/Other Studies Reviewed:    The following studies were reviewed today:   EKG:  The ekg ordered today demonstrates   CT chest lung cancer screening COMPARISON:  09/17/2020   FINDINGS: Cardiovascular: Heart size appears within normal limits. No pericardial effusion. Aortic atherosclerosis. Coronary artery calcifications.   Mediastinum/Nodes: No enlarged mediastinal, hilar, or axillary lymph nodes. Thyroid gland, trachea, and esophagus demonstrate no significant findings.   Lungs/Pleura: Mild centrilobular emphysema with diffuse bronchial wall thickening. No pleural effusion, airspace consolidation or atelectasis. Small calcified and noncalcified lung nodules are noted. The largest noncalcified nodule is in the subpleural right upper  lobe with a mean derived diameter of 2.9 mm. No suspicious lung nodules identified at this time.   Upper Abdomen: No acute findings within the imaged portions of the upper abdomen.   Musculoskeletal: Mild thoracic spondylosis. No acute or suspicious osseous findings.   IMPRESSION: 1. Lung-RADS 2, benign appearance or behavior. Continue annual screening with low-dose chest CT without contrast in 12 months. 2. Coronary artery calcifications. 3. Aortic Atherosclerosis (ICD10-I70.0) and Emphysema (ICD10-J43.9).     Electronically Signed   By: Queen Slough.D.  On: 10/02/2021 08:43     January 18, 2020 IMPRESSIONS   1. Left ventricular ejection fraction, by estimation, is 55 to 60%. The  left ventricle has normal function. The left ventricle has no regional  wall motion abnormalities. Left ventricular diastolic parameters are  consistent with Grade I diastolic  dysfunction (impaired relaxation).   2. Right ventricular systolic function is normal. The right ventricular  size is normal.   3. The mitral valve is normal in structure and function. No evidence of  mitral valve regurgitation. No evidence of mitral stenosis.   4. The aortic valve is normal in structure and function. Aortic valve  regurgitation is not visualized. No aortic stenosis is present.   5. There is no obvious atheromatous plaque identified in the visible  portions of the ascending aorta or aortic arch.   6. The inferior vena cava is normal in size with greater than 50%  respiratory variability, suggesting right atrial pressure of 3 mmHg.   Comparison(s): No prior Echocardiogram.   FINDINGS   Left Ventricle: Left ventricular ejection fraction, by estimation, is 55  to 60%. The left ventricle has normal function. The left ventricle has no  regional wall motion abnormalities. The left ventricular internal cavity  size was normal in size. There is   no left ventricular hypertrophy. Left ventricular  diastolic parameters  are consistent with Grade I diastolic dysfunction (impaired relaxation).  Normal left ventricular filling pressure.   Right Ventricle: The right ventricular size is normal. No increase in  right ventricular wall thickness. Right ventricular systolic function is  normal.   Left Atrium: Left atrial size was normal in size.   Right Atrium: Right atrial size was normal in size.   Pericardium: There is no evidence of pericardial effusion.   Mitral Valve: The mitral valve is normal in structure and function. Normal  mobility of the mitral valve leaflets. No evidence of mitral valve  regurgitation. No evidence of mitral valve stenosis.   Tricuspid Valve: The tricuspid valve is normal in structure. Tricuspid  valve regurgitation is not demonstrated. No evidence of tricuspid  stenosis.   Aortic Valve: The aortic valve is normal in structure and function. Aortic  valve regurgitation is not visualized. No aortic stenosis is present.   Pulmonic Valve: The pulmonic valve was normal in structure. Pulmonic valve  regurgitation is not visualized. No evidence of pulmonic stenosis.   Aorta: There is no obvious atheromatous plaque identified in the visible  portions of the ascending aorta or aortic arch. The aortic root is normal  in size and structure and the aortic root, ascending aorta and aortic arch  are all structurally normal,  with no evidence of dilitation or obstruction.   Venous: The inferior vena cava is normal in size with greater than 50%  respiratory variability, suggesting right atrial pressure of 3 mmHg.   IAS/Shunts: No atrial level shunt detected by color flow Doppler.       Recent Labs: 12/05/2020: BUN 7; Creatinine, Ser 0.77; Potassium 3.9; Sodium 136 08/23/2021: ALT 15  Recent Lipid Panel    Component Value Date/Time   CHOL 172 08/23/2021 0850   TRIG 131 08/23/2021 0850   HDL 54 08/23/2021 0850   CHOLHDL 3.2 08/23/2021 0850   CHOLHDL 5.7  04/20/2015 1138   VLDL 34 04/20/2015 1138   LDLCALC 95 08/23/2021 0850   LDLDIRECT 246 (H) 10/31/2015 1444    Physical Exam:    VS:  BP 138/80 (BP Location: Left Arm, Patient Position:  Sitting, Cuff Size: Normal)   Pulse 82   Ht 5\' 1"  (1.549 m)   Wt 149 lb 9.6 oz (67.9 kg)   SpO2 95%   BMI 28.27 kg/m     Wt Readings from Last 3 Encounters:  10/16/21 149 lb 9.6 oz (67.9 kg)  08/23/21 148 lb 6.4 oz (67.3 kg)  06/20/21 148 lb 6.4 oz (67.3 kg)     GEN: Well nourished, well developed in no acute distress HEENT: Normal NECK: No JVD; No carotid bruits LYMPHATICS: No lymphadenopathy CARDIAC: S1S2 noted,RRR, no murmurs, rubs, gallops RESPIRATORY:  Clear to auscultation without rales, wheezing or rhonchi  ABDOMEN: Soft, non-tender, non-distended, +bowel sounds, no guarding. EXTREMITIES: No edema, No cyanosis, no clubbing MUSCULOSKELETAL:  No deformity  SKIN: Warm and dry NEUROLOGIC:  Alert and oriented x 3, non-focal PSYCHIATRIC:  Normal affect, good insight  ASSESSMENT:    1. Mixed hyperlipidemia   2. Coronary artery calcification seen on CT scan   3. Familial hyperlipidemia   4. Familial hypercholesterolemia    PLAN:    She does not have any anginal symptoms.  We talked about the coronary calcification seen on her CT scan.We discuss the start of aspirin 81 mg daily. She is already on lipid lowering agents. I have advised the patient if she starts to experience any symptoms of chest pain or shortness of breath.   She will continue to follow with the lipid clinic.   The patient is in agreement with the above plan. The patient left the office in stable condition.  The patient will follow up in 1 year or sooner if needed.   Medication Adjustments/Labs and Tests Ordered: Current medicines are reviewed at length with the patient today.  Concerns regarding medicines are outlined above.  No orders of the defined types were placed in this encounter.  No orders of the defined  types were placed in this encounter.   Patient Instructions  Medication Instructions:  Your physician recommends that you continue on your current medications as directed. Please refer to the Current Medication list given to you today.  *If you need a refill on your cardiac medications before your next appointment, please call your pharmacy*   Lab Work: None If you have labs (blood work) drawn today and your tests are completely normal, you will receive your results only by: [facility] (if you have MyChart) OR A paper copy in the mail If you have any lab test that is abnormal or we need to change your treatment, we will call you to review the results.   Testing/Procedures: None   Follow-Up: At [facility], you and your health needs are our priority.  As part of our continuing mission to provide you with exceptional heart care, we have created designated Provider Care Teams.  These Care Teams include your primary Cardiologist (physician) and Advanced Practice Providers (APPs -  Physician Assistants and Nurse Practitioners) who all work together to provide you with the care you need, when you need it.  We recommend signing up for the patient portal called "MyChart".  Sign up information is provided on this After Visit Summary.  MyChart is used to connect with patients for Virtual Visits (Telemedicine).  Patients are able to view lab/test results, encounter notes, upcoming appointments, etc.  Non-urgent messages can be sent to your provider as well.   To learn more about what you can do with MyChart, go to [email or web address].    Your next appointment:   1 year(s)  The  format for your next appointment:   In Person  Provider:   Berniece Salines, DO     Other Instructions   Adopting a Healthy Lifestyle.  Know what a healthy weight is for you (roughly BMI <25) and aim to maintain this   Aim for 7+ servings of fruits and vegetables daily   65-80+ fluid ounces of water or  unsweet tea for healthy kidneys   Limit to max 1 drink of alcohol per day; avoid smoking/tobacco   Limit animal fats in diet for cholesterol and heart health - choose grass fed whenever available   Avoid highly processed foods, and foods high in saturated/trans fats   Aim for low stress - take time to unwind and care for your mental health   Aim for 150 min of moderate intensity exercise weekly for heart health, and weights twice weekly for bone health   Aim for 7-9 hours of sleep daily   When it comes to diets, agreement about the perfect plan isnt easy to find, even among the experts. Experts at the [employer] developed an idea known as the Healthy Eating Plate. Just imagine a plate divided into logical, healthy portions.   The emphasis is on diet quality:   Load up on vegetables and fruits - one-half of your plate: Aim for color and variety, and remember that potatoes dont count.   Go for whole grains - one-quarter of your plate: Whole wheat, barley, wheat berries, quinoa, oats, brown rice, and foods made with them. If you want pasta, go with whole wheat pasta.   Protein power - one-quarter of your plate: Fish, chicken, beans, and nuts are all healthy, versatile protein sources. Limit red meat.   The diet, however, does go beyond the plate, offering a few other suggestions.   Use healthy plant oils, such as olive, canola, soy, corn, sunflower and peanut. Check the labels, and avoid partially hydrogenated oil, which have unhealthy trans fats.   If youre thirsty, drink water. Coffee and tea are good in moderation, but skip sugary drinks and limit milk and dairy products to one or two daily servings.   The type of carbohydrate in the diet is more important than the amount. Some sources of carbohydrates, such as vegetables, fruits, whole grains, and beans-are healthier than others.   Finally, stay active  Signed, Berniece Salines, DO  10/17/2021 9:58 AM    [place]

## 2021-10-23 ENCOUNTER — Other Ambulatory Visit: Payer: Self-pay | Admitting: Family Medicine

## 2021-11-01 ENCOUNTER — Ambulatory Visit
Admission: RE | Admit: 2021-11-01 | Discharge: 2021-11-01 | Disposition: A | Discharge status: Home / Self Care | Payer: Medicaid Other | Source: Ambulatory Visit | Attending: Family Medicine | Admitting: Family Medicine

## 2021-11-01 ENCOUNTER — Other Ambulatory Visit: Payer: Self-pay

## 2021-11-01 DIAGNOSIS — Z1231 Encounter for screening mammogram for malignant neoplasm of breast: Secondary | ICD-10-CM

## 2021-11-06 ENCOUNTER — Other Ambulatory Visit: Payer: Self-pay | Admitting: Family Medicine

## 2021-11-14 ENCOUNTER — Other Ambulatory Visit: Payer: Self-pay | Admitting: Family Medicine

## 2021-11-14 DIAGNOSIS — K581 Irritable bowel syndrome with constipation: Secondary | ICD-10-CM

## 2021-11-14 DIAGNOSIS — S32010A Wedge compression fracture of first lumbar vertebra, initial encounter for closed fracture: Secondary | ICD-10-CM | POA: Diagnosis not present

## 2021-11-14 DIAGNOSIS — S22080A Wedge compression fracture of T11-T12 vertebra, initial encounter for closed fracture: Secondary | ICD-10-CM | POA: Diagnosis not present

## 2021-11-15 ENCOUNTER — Other Ambulatory Visit: Payer: Self-pay | Admitting: Family Medicine

## 2021-11-15 DIAGNOSIS — J449 Chronic obstructive pulmonary disease, unspecified: Secondary | ICD-10-CM

## 2021-11-22 DIAGNOSIS — M5124 Other intervertebral disc displacement, thoracic region: Secondary | ICD-10-CM | POA: Diagnosis not present

## 2021-11-22 DIAGNOSIS — M47814 Spondylosis without myelopathy or radiculopathy, thoracic region: Secondary | ICD-10-CM | POA: Diagnosis not present

## 2021-11-22 DIAGNOSIS — M546 Pain in thoracic spine: Secondary | ICD-10-CM | POA: Diagnosis not present

## 2021-12-05 DIAGNOSIS — M546 Pain in thoracic spine: Secondary | ICD-10-CM | POA: Diagnosis not present

## 2021-12-13 DIAGNOSIS — J209 Acute bronchitis, unspecified: Secondary | ICD-10-CM | POA: Diagnosis not present

## 2021-12-13 DIAGNOSIS — J019 Acute sinusitis, unspecified: Secondary | ICD-10-CM | POA: Diagnosis not present

## 2021-12-13 DIAGNOSIS — R0981 Nasal congestion: Secondary | ICD-10-CM | POA: Diagnosis not present

## 2021-12-25 ENCOUNTER — Ambulatory Visit: Payer: Medicaid Other | Admitting: Family Medicine

## 2021-12-25 ENCOUNTER — Other Ambulatory Visit: Payer: Self-pay

## 2021-12-25 ENCOUNTER — Encounter: Payer: Self-pay | Admitting: Family Medicine

## 2021-12-25 DIAGNOSIS — G43519 Persistent migraine aura without cerebral infarction, intractable, without status migrainosus: Secondary | ICD-10-CM

## 2021-12-25 DIAGNOSIS — G43009 Migraine without aura, not intractable, without status migrainosus: Secondary | ICD-10-CM

## 2021-12-25 DIAGNOSIS — G44209 Tension-type headache, unspecified, not intractable: Secondary | ICD-10-CM

## 2021-12-25 MED ORDER — SUMATRIPTAN SUCCINATE 50 MG PO TABS: 50.0000 mg | ORAL_TABLET | ORAL | 3 refills | Status: DC | PRN

## 2021-12-25 NOTE — Patient Instructions (Signed)
Please get off the imitrex for a week or so.  Ok to use tylenol or for your significant other to rub your neck with voltaren gel You probably have a combination of muscle contraction/tension headaches an migraines. One worry that I cannot prove is serotonin syndrome.  The combination of vanlafexamine and immitrex together may be too much for you.  Read about it and see how well it fits you. Of course, since I am not sure, if the headaches persist and certainly if they worsen, let us check you again.  I would be more likely to do more testing if these initial suggestions don't help.

## 2021-12-26 ENCOUNTER — Encounter: Payer: Self-pay | Admitting: Family Medicine

## 2021-12-26 DIAGNOSIS — G43009 Migraine without aura, not intractable, without status migrainosus: Secondary | ICD-10-CM | POA: Insufficient documentation

## 2021-12-26 DIAGNOSIS — Z1389 Encounter for screening for other disorder: Secondary | ICD-10-CM | POA: Diagnosis not present

## 2021-12-26 DIAGNOSIS — R413 Other amnesia: Secondary | ICD-10-CM | POA: Diagnosis not present

## 2021-12-26 DIAGNOSIS — M5416 Radiculopathy, lumbar region: Secondary | ICD-10-CM | POA: Diagnosis not present

## 2021-12-26 DIAGNOSIS — G894 Chronic pain syndrome: Secondary | ICD-10-CM | POA: Diagnosis not present

## 2021-12-26 NOTE — Progress Notes (Signed)
° ° °  SUBJECTIVE:   CHIEF COMPLAINT / HPI:   Chronic headaches generally well treated with rare immitrex.  Last two weeks has had constant headache.  Mostly occipital and pain behind eyes.  Some photophobia.  Does not feel and mentally sharp and feels slightly off balance.  No fever or systemic sx.  No visual or hearing change. No trauma.  Stress level is normal.  No headache at this moment. Has been taking ~3 50 mg immitrex per day.  Also on venlafaxine.     OBJECTIVE:   BP 134/65    Pulse 86    Ht 5\' 1"  (1.549 m)    Wt 144 lb 12.8 oz (65.7 kg)    SpO2 99%    BMI 27.36 kg/m   No pain over occipital ridge, FROM of neck.   EOMI, PERRLA  No temporalis pain.   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  With the combo of high dose immitrex and venlafaxine, she may have a componant of mild serotonin syndrome.       Zenia Resides, MD [place]

## 2021-12-26 NOTE — Assessment & Plan Note (Signed)
From a symptom standpoint, this seems to be mixed migraine and muscle contraction headache.  With the combo of high dose immitrex and venlafaxine, she may have a componant of mild serotonin syndrome.

## 2022-01-02 ENCOUNTER — Other Ambulatory Visit: Payer: Self-pay | Admitting: Cardiology

## 2022-01-10 DIAGNOSIS — N3091 Cystitis, unspecified with hematuria: Secondary | ICD-10-CM | POA: Diagnosis not present

## 2022-01-10 DIAGNOSIS — B3731 Acute candidiasis of vulva and vagina: Secondary | ICD-10-CM | POA: Diagnosis not present

## 2022-01-10 DIAGNOSIS — M549 Dorsalgia, unspecified: Secondary | ICD-10-CM | POA: Diagnosis not present

## 2022-01-16 DIAGNOSIS — Z1389 Encounter for screening for other disorder: Secondary | ICD-10-CM | POA: Diagnosis not present

## 2022-01-16 DIAGNOSIS — R413 Other amnesia: Secondary | ICD-10-CM | POA: Diagnosis not present

## 2022-01-16 DIAGNOSIS — M5416 Radiculopathy, lumbar region: Secondary | ICD-10-CM | POA: Diagnosis not present

## 2022-01-16 DIAGNOSIS — G894 Chronic pain syndrome: Secondary | ICD-10-CM | POA: Diagnosis not present

## 2022-02-06 DIAGNOSIS — Z1389 Encounter for screening for other disorder: Secondary | ICD-10-CM | POA: Diagnosis not present

## 2022-02-06 DIAGNOSIS — G894 Chronic pain syndrome: Secondary | ICD-10-CM | POA: Diagnosis not present

## 2022-02-06 DIAGNOSIS — R413 Other amnesia: Secondary | ICD-10-CM | POA: Diagnosis not present

## 2022-02-06 DIAGNOSIS — M5416 Radiculopathy, lumbar region: Secondary | ICD-10-CM | POA: Diagnosis not present

## 2022-02-07 ENCOUNTER — Other Ambulatory Visit: Payer: Self-pay | Admitting: Family Medicine

## 2022-02-07 DIAGNOSIS — J449 Chronic obstructive pulmonary disease, unspecified: Secondary | ICD-10-CM

## 2022-02-25 DIAGNOSIS — K115 Sialolithiasis: Secondary | ICD-10-CM | POA: Diagnosis not present

## 2022-02-27 ENCOUNTER — Other Ambulatory Visit: Payer: Self-pay | Admitting: Family Medicine

## 2022-02-27 DIAGNOSIS — G894 Chronic pain syndrome: Secondary | ICD-10-CM | POA: Diagnosis not present

## 2022-02-27 DIAGNOSIS — R413 Other amnesia: Secondary | ICD-10-CM | POA: Diagnosis not present

## 2022-02-27 DIAGNOSIS — Z79891 Long term (current) use of opiate analgesic: Secondary | ICD-10-CM | POA: Diagnosis not present

## 2022-02-27 DIAGNOSIS — M5416 Radiculopathy, lumbar region: Secondary | ICD-10-CM | POA: Diagnosis not present

## 2022-02-27 DIAGNOSIS — K5903 Drug induced constipation: Secondary | ICD-10-CM | POA: Diagnosis not present

## 2022-02-28 ENCOUNTER — Other Ambulatory Visit: Payer: Self-pay | Admitting: Family Medicine

## 2022-02-28 DIAGNOSIS — F419 Anxiety disorder, unspecified: Secondary | ICD-10-CM

## 2022-03-24 ENCOUNTER — Other Ambulatory Visit: Payer: Self-pay | Admitting: Family Medicine

## 2022-03-24 DIAGNOSIS — J449 Chronic obstructive pulmonary disease, unspecified: Secondary | ICD-10-CM

## 2022-03-31 DIAGNOSIS — R413 Other amnesia: Secondary | ICD-10-CM | POA: Diagnosis not present

## 2022-04-03 DIAGNOSIS — M5416 Radiculopathy, lumbar region: Secondary | ICD-10-CM | POA: Diagnosis not present

## 2022-04-03 DIAGNOSIS — G894 Chronic pain syndrome: Secondary | ICD-10-CM | POA: Diagnosis not present

## 2022-04-03 DIAGNOSIS — Z79891 Long term (current) use of opiate analgesic: Secondary | ICD-10-CM | POA: Diagnosis not present

## 2022-04-03 DIAGNOSIS — R413 Other amnesia: Secondary | ICD-10-CM | POA: Diagnosis not present

## 2022-04-03 DIAGNOSIS — K5903 Drug induced constipation: Secondary | ICD-10-CM | POA: Diagnosis not present

## 2022-04-17 DIAGNOSIS — R413 Other amnesia: Secondary | ICD-10-CM | POA: Diagnosis not present

## 2022-04-18 DIAGNOSIS — R252 Cramp and spasm: Secondary | ICD-10-CM | POA: Diagnosis not present

## 2022-04-18 DIAGNOSIS — R413 Other amnesia: Secondary | ICD-10-CM | POA: Diagnosis not present

## 2022-04-18 DIAGNOSIS — Z8673 Personal history of transient ischemic attack (TIA), and cerebral infarction without residual deficits: Secondary | ICD-10-CM | POA: Diagnosis not present

## 2022-04-25 DIAGNOSIS — I672 Cerebral atherosclerosis: Secondary | ICD-10-CM | POA: Diagnosis not present

## 2022-04-25 DIAGNOSIS — I771 Stricture of artery: Secondary | ICD-10-CM | POA: Diagnosis not present

## 2022-04-25 DIAGNOSIS — I6381 Other cerebral infarction due to occlusion or stenosis of small artery: Secondary | ICD-10-CM | POA: Diagnosis not present

## 2022-04-25 DIAGNOSIS — I671 Cerebral aneurysm, nonruptured: Secondary | ICD-10-CM | POA: Diagnosis not present

## 2022-04-25 DIAGNOSIS — Z8673 Personal history of transient ischemic attack (TIA), and cerebral infarction without residual deficits: Secondary | ICD-10-CM | POA: Diagnosis not present

## 2022-04-25 DIAGNOSIS — I6503 Occlusion and stenosis of bilateral vertebral arteries: Secondary | ICD-10-CM | POA: Diagnosis not present

## 2022-05-01 DIAGNOSIS — G894 Chronic pain syndrome: Secondary | ICD-10-CM | POA: Diagnosis not present

## 2022-05-01 DIAGNOSIS — K5903 Drug induced constipation: Secondary | ICD-10-CM | POA: Diagnosis not present

## 2022-05-01 DIAGNOSIS — Z79891 Long term (current) use of opiate analgesic: Secondary | ICD-10-CM | POA: Diagnosis not present

## 2022-05-01 DIAGNOSIS — R413 Other amnesia: Secondary | ICD-10-CM | POA: Diagnosis not present

## 2022-05-01 DIAGNOSIS — M5416 Radiculopathy, lumbar region: Secondary | ICD-10-CM | POA: Diagnosis not present

## 2022-05-06 ENCOUNTER — Encounter: Payer: Self-pay | Admitting: *Deleted

## 2022-05-29 DIAGNOSIS — Z79891 Long term (current) use of opiate analgesic: Secondary | ICD-10-CM | POA: Diagnosis not present

## 2022-05-29 DIAGNOSIS — K5903 Drug induced constipation: Secondary | ICD-10-CM | POA: Diagnosis not present

## 2022-05-29 DIAGNOSIS — G894 Chronic pain syndrome: Secondary | ICD-10-CM | POA: Diagnosis not present

## 2022-05-29 DIAGNOSIS — R413 Other amnesia: Secondary | ICD-10-CM | POA: Diagnosis not present

## 2022-05-29 DIAGNOSIS — M5416 Radiculopathy, lumbar region: Secondary | ICD-10-CM | POA: Diagnosis not present

## 2022-06-13 ENCOUNTER — Other Ambulatory Visit: Payer: Self-pay | Admitting: *Deleted

## 2022-06-13 DIAGNOSIS — I6381 Other cerebral infarction due to occlusion or stenosis of small artery: Secondary | ICD-10-CM | POA: Diagnosis not present

## 2022-06-13 DIAGNOSIS — K581 Irritable bowel syndrome with constipation: Secondary | ICD-10-CM

## 2022-06-16 MED ORDER — ESOMEPRAZOLE MAGNESIUM 40 MG PO CPDR: DELAYED_RELEASE_CAPSULE | ORAL | 1 refills | Status: DC

## 2022-06-16 MED ORDER — LINACLOTIDE 145 MCG PO CAPS: ORAL_CAPSULE | ORAL | 5 refills | Status: DC

## 2022-06-30 DIAGNOSIS — Z8673 Personal history of transient ischemic attack (TIA), and cerebral infarction without residual deficits: Secondary | ICD-10-CM | POA: Diagnosis not present

## 2022-06-30 DIAGNOSIS — Z87891 Personal history of nicotine dependence: Secondary | ICD-10-CM | POA: Diagnosis not present

## 2022-06-30 DIAGNOSIS — I6523 Occlusion and stenosis of bilateral carotid arteries: Secondary | ICD-10-CM | POA: Insufficient documentation

## 2022-07-10 DIAGNOSIS — G894 Chronic pain syndrome: Secondary | ICD-10-CM | POA: Diagnosis not present

## 2022-07-10 DIAGNOSIS — Z79891 Long term (current) use of opiate analgesic: Secondary | ICD-10-CM | POA: Diagnosis not present

## 2022-07-10 DIAGNOSIS — R413 Other amnesia: Secondary | ICD-10-CM | POA: Diagnosis not present

## 2022-07-10 DIAGNOSIS — M5416 Radiculopathy, lumbar region: Secondary | ICD-10-CM | POA: Diagnosis not present

## 2022-07-10 DIAGNOSIS — K5903 Drug induced constipation: Secondary | ICD-10-CM | POA: Diagnosis not present

## 2022-07-14 ENCOUNTER — Other Ambulatory Visit: Payer: Self-pay

## 2022-07-14 DIAGNOSIS — G43519 Persistent migraine aura without cerebral infarction, intractable, without status migrainosus: Secondary | ICD-10-CM

## 2022-07-14 MED ORDER — SUMATRIPTAN SUCCINATE 50 MG PO TABS: 50.0000 mg | ORAL_TABLET | ORAL | 3 refills | Status: DC | PRN

## 2022-07-21 ENCOUNTER — Other Ambulatory Visit: Payer: Self-pay | Admitting: *Deleted

## 2022-07-21 DIAGNOSIS — F419 Anxiety disorder, unspecified: Secondary | ICD-10-CM

## 2022-07-21 MED ORDER — VENLAFAXINE HCL ER 37.5 MG PO CP24: 37.5000 mg | ORAL_CAPSULE | Freq: Every day | ORAL | 3 refills | Status: DC

## 2022-08-19 ENCOUNTER — Encounter: Payer: Self-pay | Admitting: Gastroenterology

## 2022-08-27 ENCOUNTER — Telehealth: Payer: Self-pay | Admitting: Cardiology

## 2022-08-27 DIAGNOSIS — I2510 Atherosclerotic heart disease of native coronary artery without angina pectoris: Secondary | ICD-10-CM

## 2022-08-27 DIAGNOSIS — E782 Mixed hyperlipidemia: Secondary | ICD-10-CM

## 2022-08-27 DIAGNOSIS — E7800 Pure hypercholesterolemia, unspecified: Secondary | ICD-10-CM

## 2022-08-27 MED ORDER — PRALUENT 150 MG/ML ~~LOC~~ SOAJ: 150.0000 mg | SUBCUTANEOUS | 11 refills | Status: DC

## 2022-08-27 NOTE — Telephone Encounter (Signed)
*  STAT* If patient is at the pharmacy, call can be transferred to refill team.   1. Which medications need to be refilled? (please list name of each medication and dose if known) Praluent  2. Which pharmacy/location (including street and city if local pharmacy) is medication to be sent to? [facility] [place]  3. Do they need a 30 day or 90 day supply? 90 days and refills

## 2022-08-28 ENCOUNTER — Other Ambulatory Visit: Payer: Self-pay | Admitting: *Deleted

## 2022-08-28 DIAGNOSIS — M109 Gout, unspecified: Secondary | ICD-10-CM

## 2022-08-28 MED ORDER — ALLOPURINOL 100 MG PO TABS: 100.0000 mg | ORAL_TABLET | Freq: Every day | ORAL | 2 refills | Status: DC

## 2022-08-30 DIAGNOSIS — R051 Acute cough: Secondary | ICD-10-CM | POA: Diagnosis not present

## 2022-08-30 DIAGNOSIS — J029 Acute pharyngitis, unspecified: Secondary | ICD-10-CM | POA: Diagnosis not present

## 2022-08-30 DIAGNOSIS — J324 Chronic pansinusitis: Secondary | ICD-10-CM | POA: Diagnosis not present

## 2022-08-30 DIAGNOSIS — H9209 Otalgia, unspecified ear: Secondary | ICD-10-CM | POA: Diagnosis not present

## 2022-09-07 DIAGNOSIS — Z720 Tobacco use: Secondary | ICD-10-CM | POA: Diagnosis not present

## 2022-09-07 DIAGNOSIS — R9431 Abnormal electrocardiogram [ECG] [EKG]: Secondary | ICD-10-CM | POA: Diagnosis not present

## 2022-09-07 DIAGNOSIS — J441 Chronic obstructive pulmonary disease with (acute) exacerbation: Secondary | ICD-10-CM | POA: Diagnosis not present

## 2022-09-07 DIAGNOSIS — R4182 Altered mental status, unspecified: Secondary | ICD-10-CM | POA: Diagnosis not present

## 2022-09-07 DIAGNOSIS — I959 Hypotension, unspecified: Secondary | ICD-10-CM | POA: Diagnosis not present

## 2022-09-07 DIAGNOSIS — R55 Syncope and collapse: Secondary | ICD-10-CM | POA: Diagnosis not present

## 2022-09-08 DIAGNOSIS — R55 Syncope and collapse: Secondary | ICD-10-CM | POA: Diagnosis not present

## 2022-09-09 ENCOUNTER — Encounter: Payer: Self-pay | Admitting: Family Medicine

## 2022-09-09 ENCOUNTER — Ambulatory Visit (INDEPENDENT_AMBULATORY_CARE_PROVIDER_SITE_OTHER): Payer: Medicaid Other | Admitting: Family Medicine

## 2022-09-09 ENCOUNTER — Telehealth: Payer: Self-pay | Admitting: Cardiology

## 2022-09-09 VITALS — BP 98/58 | HR 91 | Ht 61.0 in | Wt 139.0 lb

## 2022-09-09 DIAGNOSIS — Z0000 Encounter for general adult medical examination without abnormal findings: Secondary | ICD-10-CM | POA: Diagnosis not present

## 2022-09-09 DIAGNOSIS — E782 Mixed hyperlipidemia: Secondary | ICD-10-CM

## 2022-09-09 DIAGNOSIS — R55 Syncope and collapse: Secondary | ICD-10-CM | POA: Diagnosis not present

## 2022-09-09 DIAGNOSIS — Z720 Tobacco use: Secondary | ICD-10-CM | POA: Diagnosis not present

## 2022-09-09 DIAGNOSIS — I2510 Atherosclerotic heart disease of native coronary artery without angina pectoris: Secondary | ICD-10-CM

## 2022-09-09 DIAGNOSIS — E7800 Pure hypercholesterolemia, unspecified: Secondary | ICD-10-CM

## 2022-09-09 MED ORDER — LIDOCAINE 4 % EX PTCH: 1.0000 | MEDICATED_PATCH | CUTANEOUS | 2 refills | Status: AC

## 2022-09-09 MED ORDER — ZOSTER VAC RECOMB ADJUVANTED 50 MCG/0.5ML IM SUSR: 0.5000 mL | Freq: Once | INTRAMUSCULAR | 0 refills | Status: AC

## 2022-09-09 NOTE — Patient Instructions (Addendum)
It was great to see you!  Please call your cardiologist after you leave here today to arrange follow-up.  Bring [place] a copy of your advanced directives at your earliest convenience.  I have ordered your annual lung cancer screening CT.   I sent lidocaine patches to your pharmacy.  Get your shingles shot at your pharmacy. You may have a sore arm, fatigue, body aches, fever, headache for 24-48 hrs after this vaccine.  See me back in 2-3 months to check-in.  Take care, Dr Rock Nephew

## 2022-09-09 NOTE — Progress Notes (Unsigned)
    SUBJECTIVE:   Chief compliant/HPI: annual examination  Diana [name] is a 66 y.o. who presents today for an annual exam.   Hospital F/U -Admitted to [facility] for syncopal episode -had been exhausted for a few days -slumped over in the chair while playing a computer game with a friend -felt super hot -?EKG changes -sees Dr Harriet Masson, will call her today  Lidocaine patch- [facility]  History tabs reviewed and updated ***.   Review of systems form reviewed and notable for ***.   OBJECTIVE:   BP (!) 98/58   Pulse 91   Ht $R'5\' 1"'Mw$  (1.549 m)   Wt 139 lb (63 kg)   SpO2 98%   BMI 26.26 kg/m   ***  ASSESSMENT/PLAN:   No problem-specific Assessment & Plan notes found for this encounter.    Annual Examination  See AVS for age appropriate recommendations  PHQ score 3, reviewed and discussed.  BP reviewed and at goal-- on low side.  Advance directives discussion: patient has advanced directives. Will bring copy to our office for our records.  Considered the following items based upon USPSTF recommendations: Diabetes screening:  not indicated Screening for elevated cholesterol: follows with lipid clinic. Due for lipid panel HIV testing: nonreactive in 2020 Hepatitis C: negative in 2018  Syphilis if at high risk: negative in 2020 GC/CT not at high risk and not ordered. Osteoporosis screening considered based upon risk of fracture from [facility] calculator. Major osteoporotic fracture risk is 8.8%. DEXA not ordered.    Discussed family history, BRCA testing not indicated.  Cervical cancer screening: not indicated given history of hysterectomy Breast cancer screening:  UTD on mammography Colorectal cancer screening: up to date on screening for CRC. Lung cancer screening:  ordered . See documentation below regarding indications/risks/benefits.  Vaccinations ***.   Follow up in 1 *** year or sooner if indicated.    Alcus Dad, MD [place]

## 2022-09-09 NOTE — Telephone Encounter (Signed)
Follow Up:    Patient says she is having problems get her Prualent

## 2022-09-10 ENCOUNTER — Encounter: Payer: Self-pay | Admitting: Family Medicine

## 2022-09-11 MED ORDER — PRALUENT 150 MG/ML ~~LOC~~ SOAJ: 150.0000 mg | SUBCUTANEOUS | 0 refills | Status: DC

## 2022-09-11 NOTE — Telephone Encounter (Signed)
Refill sent to pharmacy.   

## 2022-09-12 ENCOUNTER — Encounter: Payer: Self-pay | Admitting: Family Medicine

## 2022-09-16 ENCOUNTER — Telehealth: Payer: Self-pay | Admitting: Cardiology

## 2022-09-16 NOTE — Telephone Encounter (Signed)
The patient called and stated she was recently hospitalized at [facility] for dizziness. They wanted to do a stress test while at the hospital but she refused stating that she wanted to have this done outpatient.   She has been to cardiac rehab as well. They stated that the monitor was abnormal and the patient should be having a stress test. She stated that they were going to fax all the records to Dr. Harriet Masson. She was unable to provide anymore details.   She has an appointment with Dr. Harriet Masson on 10/09/22 but wants the stress test prior.   Number to  cardiac [phone] if needed.

## 2022-09-16 NOTE — Telephone Encounter (Signed)
Pt would like a callback regarding Heart Monitor results. Please advise 

## 2022-09-17 ENCOUNTER — Other Ambulatory Visit: Payer: Self-pay

## 2022-09-17 ENCOUNTER — Telehealth: Payer: Self-pay | Admitting: Cardiology

## 2022-09-17 DIAGNOSIS — I2510 Atherosclerotic heart disease of native coronary artery without angina pectoris: Secondary | ICD-10-CM

## 2022-09-17 DIAGNOSIS — E782 Mixed hyperlipidemia: Secondary | ICD-10-CM

## 2022-09-17 DIAGNOSIS — E7800 Pure hypercholesterolemia, unspecified: Secondary | ICD-10-CM

## 2022-09-17 NOTE — Addendum Note (Signed)
Addended by: Rollen Sox on: 09/17/2022 09:38 AM   Modules accepted: Orders

## 2022-09-17 NOTE — Telephone Encounter (Signed)
Patient calling back saying the meds need an approval. Please advise

## 2022-09-17 NOTE — Telephone Encounter (Signed)
States she was returning call. Please advise

## 2022-09-17 NOTE — Telephone Encounter (Addendum)
Updating PA.  Key: [id number].  Patient needs to have lipid panel updated

## 2022-09-17 NOTE — Telephone Encounter (Signed)
Called Rehab and requested records to be faxed to Dr. Harriet Masson

## 2022-09-17 NOTE — Telephone Encounter (Signed)
Called patient and left message on machine to update lipid panel,

## 2022-09-17 NOTE — Telephone Encounter (Signed)
Returned call to [facility] pulmonary Paulla Fore states that she was trying to admit pt to Pulmonary rehab. She tried to do the 6 minute walk with pt and she was unable to make it even a minute. She had an ST depression, tightness in her chest and her BP dropped. She was referred with the DX of COPD and Sherri "couldn't admit pt with this diagnosis".  Ultimately, pt needs to come to her appt 11-8  to see what the plan is to go forward. See other message, pt would like a stress test before her appointment. Informed Sherri that her last appt was 10-2021. She will relay this to pt and inform her to make sure she comes to her appointment as well.

## 2022-09-18 DIAGNOSIS — R079 Chest pain, unspecified: Secondary | ICD-10-CM | POA: Diagnosis not present

## 2022-09-18 DIAGNOSIS — R55 Syncope and collapse: Secondary | ICD-10-CM | POA: Diagnosis not present

## 2022-09-18 DIAGNOSIS — I6523 Occlusion and stenosis of bilateral carotid arteries: Secondary | ICD-10-CM | POA: Diagnosis not present

## 2022-09-18 LAB — LIPID PANEL
Chol/HDL Ratio: 3.7 ratio (ref 0.0–4.4)
Cholesterol, Total: 242 mg/dL — ABNORMAL HIGH (ref 100–199)
HDL: 65 mg/dL (ref >39)
LDL Chol Calc (NIH): 165 mg/dL — ABNORMAL HIGH (ref 0–99)
Triglycerides: 69 mg/dL (ref 0–149)
VLDL Cholesterol Cal: 12 mg/dL (ref 5–40)

## 2022-09-18 MED ORDER — PRALUENT 150 MG/ML ~~LOC~~ SOAJ: 150.0000 mg | SUBCUTANEOUS | 1 refills | Status: DC

## 2022-09-18 NOTE — Addendum Note (Signed)
Addended by: Rollen Sox on: 09/18/2022 11:55 AM   Modules accepted: Orders

## 2022-09-18 NOTE — Telephone Encounter (Addendum)
Praluent PA approved until 09/18/23

## 2022-09-19 DIAGNOSIS — E785 Hyperlipidemia, unspecified: Secondary | ICD-10-CM | POA: Diagnosis not present

## 2022-09-19 DIAGNOSIS — M109 Gout, unspecified: Secondary | ICD-10-CM | POA: Diagnosis not present

## 2022-09-19 DIAGNOSIS — G43909 Migraine, unspecified, not intractable, without status migrainosus: Secondary | ICD-10-CM | POA: Diagnosis not present

## 2022-09-19 DIAGNOSIS — R200 Anesthesia of skin: Secondary | ICD-10-CM | POA: Diagnosis not present

## 2022-09-19 DIAGNOSIS — R55 Syncope and collapse: Secondary | ICD-10-CM | POA: Diagnosis not present

## 2022-09-19 DIAGNOSIS — Z87891 Personal history of nicotine dependence: Secondary | ICD-10-CM | POA: Diagnosis not present

## 2022-09-19 DIAGNOSIS — H532 Diplopia: Secondary | ICD-10-CM | POA: Diagnosis not present

## 2022-09-19 DIAGNOSIS — K219 Gastro-esophageal reflux disease without esophagitis: Secondary | ICD-10-CM | POA: Diagnosis not present

## 2022-09-19 DIAGNOSIS — I6523 Occlusion and stenosis of bilateral carotid arteries: Secondary | ICD-10-CM | POA: Diagnosis not present

## 2022-09-19 DIAGNOSIS — I10 Essential (primary) hypertension: Secondary | ICD-10-CM | POA: Diagnosis not present

## 2022-09-20 DIAGNOSIS — R072 Precordial pain: Secondary | ICD-10-CM | POA: Diagnosis not present

## 2022-09-20 DIAGNOSIS — G43909 Migraine, unspecified, not intractable, without status migrainosus: Secondary | ICD-10-CM | POA: Diagnosis not present

## 2022-09-20 DIAGNOSIS — I6521 Occlusion and stenosis of right carotid artery: Secondary | ICD-10-CM | POA: Diagnosis not present

## 2022-09-20 DIAGNOSIS — I6523 Occlusion and stenosis of bilateral carotid arteries: Secondary | ICD-10-CM | POA: Diagnosis not present

## 2022-09-20 DIAGNOSIS — M109 Gout, unspecified: Secondary | ICD-10-CM | POA: Diagnosis not present

## 2022-09-20 DIAGNOSIS — E785 Hyperlipidemia, unspecified: Secondary | ICD-10-CM | POA: Diagnosis not present

## 2022-09-20 DIAGNOSIS — K219 Gastro-esophageal reflux disease without esophagitis: Secondary | ICD-10-CM | POA: Diagnosis not present

## 2022-09-20 DIAGNOSIS — R55 Syncope and collapse: Secondary | ICD-10-CM | POA: Diagnosis not present

## 2022-09-20 DIAGNOSIS — I10 Essential (primary) hypertension: Secondary | ICD-10-CM | POA: Diagnosis not present

## 2022-09-21 DIAGNOSIS — M109 Gout, unspecified: Secondary | ICD-10-CM | POA: Diagnosis not present

## 2022-09-21 DIAGNOSIS — I10 Essential (primary) hypertension: Secondary | ICD-10-CM | POA: Diagnosis not present

## 2022-09-21 DIAGNOSIS — R55 Syncope and collapse: Secondary | ICD-10-CM | POA: Diagnosis not present

## 2022-09-21 DIAGNOSIS — K219 Gastro-esophageal reflux disease without esophagitis: Secondary | ICD-10-CM | POA: Diagnosis not present

## 2022-09-21 DIAGNOSIS — I6521 Occlusion and stenosis of right carotid artery: Secondary | ICD-10-CM | POA: Diagnosis not present

## 2022-09-21 DIAGNOSIS — G43909 Migraine, unspecified, not intractable, without status migrainosus: Secondary | ICD-10-CM | POA: Diagnosis not present

## 2022-09-21 DIAGNOSIS — E785 Hyperlipidemia, unspecified: Secondary | ICD-10-CM | POA: Diagnosis not present

## 2022-09-22 DIAGNOSIS — E785 Hyperlipidemia, unspecified: Secondary | ICD-10-CM | POA: Diagnosis not present

## 2022-09-22 DIAGNOSIS — G43909 Migraine, unspecified, not intractable, without status migrainosus: Secondary | ICD-10-CM | POA: Diagnosis not present

## 2022-09-22 DIAGNOSIS — I10 Essential (primary) hypertension: Secondary | ICD-10-CM | POA: Diagnosis not present

## 2022-09-22 DIAGNOSIS — I6503 Occlusion and stenosis of bilateral vertebral arteries: Secondary | ICD-10-CM | POA: Diagnosis not present

## 2022-09-22 DIAGNOSIS — M109 Gout, unspecified: Secondary | ICD-10-CM | POA: Diagnosis not present

## 2022-09-22 DIAGNOSIS — K219 Gastro-esophageal reflux disease without esophagitis: Secondary | ICD-10-CM | POA: Diagnosis not present

## 2022-09-22 DIAGNOSIS — R55 Syncope and collapse: Secondary | ICD-10-CM | POA: Diagnosis not present

## 2022-09-22 DIAGNOSIS — I672 Cerebral atherosclerosis: Secondary | ICD-10-CM | POA: Diagnosis not present

## 2022-09-22 DIAGNOSIS — I6521 Occlusion and stenosis of right carotid artery: Secondary | ICD-10-CM | POA: Diagnosis not present

## 2022-09-22 DIAGNOSIS — I771 Stricture of artery: Secondary | ICD-10-CM | POA: Diagnosis not present

## 2022-09-22 DIAGNOSIS — I6523 Occlusion and stenosis of bilateral carotid arteries: Secondary | ICD-10-CM | POA: Diagnosis not present

## 2022-09-23 DIAGNOSIS — R55 Syncope and collapse: Secondary | ICD-10-CM | POA: Diagnosis not present

## 2022-09-24 ENCOUNTER — Encounter: Payer: Self-pay | Admitting: Cardiology

## 2022-09-24 ENCOUNTER — Other Ambulatory Visit (INDEPENDENT_AMBULATORY_CARE_PROVIDER_SITE_OTHER): Payer: Medicaid Other

## 2022-09-24 ENCOUNTER — Ambulatory Visit: Payer: Medicaid Other | Attending: Cardiology | Admitting: Cardiology

## 2022-09-24 VITALS — BP 90/60 | HR 78 | Ht 61.0 in | Wt 140.0 lb

## 2022-09-24 DIAGNOSIS — R55 Syncope and collapse: Secondary | ICD-10-CM

## 2022-09-24 DIAGNOSIS — Z8673 Personal history of transient ischemic attack (TIA), and cerebral infarction without residual deficits: Secondary | ICD-10-CM | POA: Diagnosis not present

## 2022-09-24 DIAGNOSIS — G459 Transient cerebral ischemic attack, unspecified: Secondary | ICD-10-CM

## 2022-09-24 DIAGNOSIS — J449 Chronic obstructive pulmonary disease, unspecified: Secondary | ICD-10-CM | POA: Diagnosis not present

## 2022-09-24 DIAGNOSIS — E782 Mixed hyperlipidemia: Secondary | ICD-10-CM | POA: Diagnosis not present

## 2022-09-24 DIAGNOSIS — I10 Essential (primary) hypertension: Secondary | ICD-10-CM | POA: Diagnosis not present

## 2022-09-24 DIAGNOSIS — R42 Dizziness and giddiness: Secondary | ICD-10-CM | POA: Diagnosis not present

## 2022-09-24 DIAGNOSIS — N182 Chronic kidney disease, stage 2 (mild): Secondary | ICD-10-CM

## 2022-09-24 NOTE — Progress Notes (Unsigned)
Enrolled for [employer] to mail a ZIO AT Live Telemetry monitor to patients address on file.  

## 2022-09-24 NOTE — Patient Instructions (Signed)
Medication Instructions:  Your physician recommends that you continue on your current medications as directed. Please refer to the Current Medication list given to you today.  *If you need a refill on your cardiac medications before your next appointment, please call your pharmacy*   Lab Work: NONE If you have labs (blood work) drawn today and your tests are completely normal, you will receive your results only by: Diana Diana [name] (if you have MyChart) OR A paper copy in the mail If you have any lab test that is abnormal or we need to change your treatment, we will call you to review the results.   Testing/Procedures: Diana Diana [name] Long term monitor-Live Telemetry  Your physician has requested you wear a Diana patch monitor for 14 days.  This is a single patch monitor. Diana Diana [name] supplies one patch monitor per enrollment. Additional  stickers are not available.  Please do not apply patch if you will be having a Nuclear Stress Test, Echocardiogram, Cardiac CT, MRI,  or Chest Xray during the period you would be wearing the monitor. The patch cannot be worn during  these tests. You cannot remove and re-apply the Diana Diana [name] patch monitor.  Your Diana patch monitor will be mailed 3 day USPS to your address on file. It may take 3-5 days to  receive your monitor after you have been enrolled.  Once you have received your monitor, please review the enclosed instructions. Your monitor has  already been registered assigning a specific monitor serial # to you.   Billing and Patient Assistance Program information  Diana Diana [name] has been supplied with any insurance information on record for billing. Diana Diana [name] offers a sliding scale Patient Assistance Program for patients without insurance, or whose  insurance does not completely cover the cost of the Diana patch monitor. You must apply for the  Patient Assistance Program to qualify for the discounted rate. To apply, call Diana Diana [name] Diana [name] [phone],  select option 4, select  option 2 , ask to apply for the Patient Assistance Program, (you can request an  interpreter if needed). Diana Diana [name] will ask your household income and how many people are in your  household. Diana Diana [name] will quote your out-of-pocket cost based on this information. They will also be able  to set up a 12 month interest free payment plan if needed.  Applying the monitor   Shave hair from upper left chest.  Hold the abrader disc by orange tab. Rub the abrader in 40 strokes over left upper chest as indicated in  your monitor instructions.  Clean area with 4 enclosed alcohol pads. Use all pads to ensure the area is cleaned thoroughly. Let  dry.  Apply patch as indicated in monitor instructions. Patch will be placed under collarbone on left side of  chest with arrow pointing upward.  Rub patch adhesive wings for 2 minutes. Remove the white label marked "1". Remove the white label  marked "2". Rub patch adhesive wings for 2 additional minutes.  While looking in a mirror, press and release button in center of patch. A small green light will flash 3-4  times. This will be your only indicator that the monitor has been turned on.  Do not shower for the first 24 hours. You may shower after the first 24 hours.  Press the button if you feel a symptom. You will hear a small click. Record Date, Time and Symptom in  the Patient Log.   Starting the Gateway  In your kit there is a Optometrist  the size of a cellphone. This is Airline pilot. It transmits all your  recorded data to Diana Diana [name]. This box must always stay within 10 feet of you. Open the box and push the *  button. There will be a light that blinks orange and then green a few times. When the light stops  blinking, the Gateway is connected to the Diana patch. Call Diana Diana [name] Diana [name] [phone] to confirm your monitor is transmitting.  Returning your monitor  Remove your patch and place it inside the Bureau. In the lower half of the Gateway there is a white   bag with prepaid postage on it. Place Gateway in bag and seal. Mail package back to Diana [name] as soon as  possible. Your physician should have your final report approximately 7 days after you have mailed back  your monitor. Call Diana Diana [name] [phone] if you have questions regarding your Diana Diana [name]  patch monitor. Call them immediately if you see an orange light blinking on your monitor.  If your monitor falls off in less than 4 days, contact our Monitor department Diana [name] [phone]. If your  monitor becomes loose or falls off after 4 days call Diana Diana [name] Diana [name] [phone] for suggestions on  securing your monitor    Follow-Up: Diana [name] [facility], you and your health needs are our priority.  As part of our continuing mission to provide you with exceptional heart care, we have created designated Provider Care Teams.  These Care Teams include your primary Cardiologist (physician) and Advanced Practice Providers (APPs -  Physician Assistants and Nurse Practitioners) who all work together to provide you with the care you need, when you need it.  We recommend signing up for the patient portal called "MyChart".  Sign up information is provided on this After Visit Summary.  MyChart is used to connect with patients for Virtual Visits (Telemedicine).  Patients are able to view lab/test results, encounter notes, upcoming appointments, etc.  Non-urgent messages can be sent to your provider as well.   To learn more about what you can do with MyChart, go to [email or web address].    Your next appointment:   3 month(s)  The format for your next appointment:   In Person  Provider:   Berniece Salines, DO

## 2022-09-24 NOTE — Progress Notes (Signed)
Cardiology Office Note:    Date:  09/24/2022   ID:  Diana [name], DOB 1956-08-17, MRN [id number]  PCP:  Alcus Dad, MD  Cardiologist:  Berniece Salines, DO  Electrophysiologist:  None   Referring MD: Alcus Dad, MD   " I am doing well"  History of Present Illness:    Diana [name] is a 66 y.o. female with a hx of  Hollenhorst plaque, hyperlipidemia, hypercholesterolemia, hypertension and tobacco abuse. I saw the patient on 02/10/2020 at that time we discussed her monitor result, and due to the evidence of second degree AV block, I stopped her beta-blocker and referred her to EP.  In the meantime we held her surgery once she was evaluated by EP.    Due to improvement of beta-blocker there is no indication for pacemaker.  Her blood work shows significant elevated LDL she was started on Livalo and Zetia.  I saw the patient March 20, 2020 at that time she was experiencing some leg swelling we therefore did a ultrasound duplex to rule out DVT.  We did discuss that we were going to repeat her lipid profile and then send the patient for PCSK9 inhibitors.  I saw the patient in 10/2021 at that time she was doing well.   Recently she has had two hospitalization: she was at [facility] during that hospitaization she had an echo which was reported to be low normal. She then developed chest pain at home after her discharge from [facility] had a presyncope episode. She was worked up for stroke - hx of lacunar infarct. During her work up she was noted to have carotid artery stenosis ( 75%) in the right.  She also had a dobutamine stress test which was negative, echo was normal with no pfo.  She is here today with her significant other.    Past Medical History:  Diagnosis Date   Acute gout involving toe of left foot 10/12/2019   Anxiety    Asthma    Barrett esophagus    "don't know that I've ever been stretched" (08/17/2013)   Bilateral leg edema 07/16/2020   Choledocholithiasis with acute  cholecystitis    Chronic lower back pain    COPD (chronic obstructive pulmonary disease) ([place])    Craniosynostosis    congenital   DDD (degenerative disc disease)    Family history of anesthesia complication    "PONV; both parents" (08/17/2013)   GERD (gastroesophageal reflux disease)    H/O hiatal hernia    H/O: hysterectomy 09/22/2014   By her report at age 51. Has never been on Norma replacement therapy. 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 Switched to lovastatin b/c its $4.     Hypertension    Hyponatremia 08/28/2020   IBS (irritable bowel syndrome)    Kidney stones 07/22/2017   Migraines    "since age 50; probably twice/month" (08/17/2013)   PONV (postoperative nausea and vomiting)    Postmenopausal atrophic vaginitis 09/22/2014   History of hysterectomy. We'll try adding estradiol cream 3 times a week. I would give this for 8 weeks and she's not having some improvement, I would have her return to clinic. If she is having improvement she can decrease the frequency of her applications to once or twice a week. If she's not  had improvement then I would consider increasing him to 7 days a week. 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   Shingles    Sinus pause 02/10/2020   Sleep apnea-like behavior 03/20/2020   Takes dietary supplements 07/28/2020    Past Surgical History:  Procedure Laterality Date   ABDOMINAL HYSTERECTOMY  1992   Total for chronic pelvic pain    APPENDECTOMY     BACK SURGERY  1990's   L4-L5 fusion @ [place]  05/28/2019   fused C4-5-6, done in [place]   2007   Performed in [place]   "1st time got infected; 2nd time didn't take; fix the 3rd time" (08/17/2013)   POSTERIOR LUMBAR FUSION  1990's   "took piece off my hip" (08/17/2013)   TOTAL HIP ARTHROPLASTY Right 12/31/04   AVN after fall injury; performed by Ralene Cork; DePuy S-ROM total hip (metal on metal)   UPPER GASTROINTESTINAL ENDOSCOPY      Current Medications: No outpatient medications have been marked as taking for the 09/24/22 encounter (Office Visit) with Berniece Salines, DO.     Allergies:   Penicillins, Demerol [meperidine], Doxycycline hyclate, Gabapentin, Sulfa antibiotics, Atorvastatin, Fluvastatin, Levaquin [levofloxacin], Lovastatin, No known allergies, Pitavastatin, Pravastatin, Rosuvastatin, Simvastatin, Benadryl [diphenhydramine hcl], Buprenorphine hcl, Cephalexin, Morphine and related, and Spiriva handihaler [tiotropium bromide monohydrate]   Social History   Socioeconomic History   Marital status: Divorced    Spouse name: Not on file   Number of children: 2   Years of education: Not on file   Highest education level: Not on file  Occupational History   Occupation: retired  Tobacco Use   Smoking status: Former    Packs/day: 0.50    Years: 32.00    Total pack years: 16.00    Types: Cigarettes    Quit date: 05/30/2022    Years since quitting: 0.3    Passive exposure: Past   Smokeless tobacco: Never   Tobacco comments:    on chanix  Vaping Use   Vaping Use: Every day  Substance and Sexual Activity   Alcohol use: Yes    Alcohol/week: 0.0 standard drinks of alcohol    Comment: occasionally   Drug use: No   Sexual activity: Not Currently  Other Topics Concern   Not on file  Social History Narrative   Not on file   Social Determinants of Health   Financial Resource Strain: Not on file  Food Insecurity: Not on file  Transportation Needs: Not on file  Physical Activity: Not on file   Stress: Not on file  Social Connections: Not on file     Family History: The patient's family history includes Breast cancer in her maternal grandmother; Colon cancer in her paternal grandfather; Hypertension in her father and mother; Skin cancer in her father. There is no history of Rectal cancer, Stomach cancer, or Esophageal cancer.  ROS:   Review of Systems  Constitution: Negative for decreased appetite, fever and weight gain.  HENT: Negative for congestion, ear discharge, hoarse voice and sore throat.   Eyes: Negative for discharge, redness, vision loss in right eye and visual halos.  Cardiovascular: Negative for chest pain, dyspnea on exertion, leg  swelling, orthopnea and palpitations.  Respiratory: Negative for cough, hemoptysis, shortness of breath and snoring.   Endocrine: Negative for heat intolerance and polyphagia.  Hematologic/Lymphatic: Negative for bleeding problem. Does not bruise/bleed easily.  Skin: Negative for flushing, nail changes, rash and suspicious lesions.  Musculoskeletal: Negative for arthritis, joint pain, muscle cramps, myalgias, neck pain and stiffness.  Gastrointestinal: Negative for abdominal pain, bowel incontinence, diarrhea and excessive appetite.  Genitourinary: Negative for decreased libido, genital sores and incomplete emptying.  Neurological: Negative for brief paralysis, focal weakness, headaches and loss of balance.  Psychiatric/Behavioral: Negative for altered mental status, depression and suicidal ideas.  Allergic/Immunologic: Negative for HIV exposure and persistent infections.    EKGs/Labs/Other Studies Reviewed:    The following studies were reviewed today:   EKG:  The ekg ordered today demonstrates   Stress echo SUMMARY  Normal left ventricular function at rest.  The estimated LV ejection fraction is 60-65% .  There were no segmental wall motion abnormalities at rest  Normal left ventricular function and global wall motion with  stress.  There were no segmental wall motion abnormalities with dobutamine.  Negative dobutamine echocardiography for inducible ischemia at target  heart rate.   TTE 09/20/2022 PROCEDURE  A two-dimensional transthoracic echocardiogram with color flow and Doppler was  performed. Image Quality: Technically adequate. Injection of agitated saline  contrast performed to evaluate for possible shunt.  -  SUMMARY  Normal echo doppler study  agitated saline study negative for shunt.  Injection of agitated saline contrast performed to evaluate for possible shunt   -  FINDINGS:  LEFT VENTRICLE  The left ventricular size is normal. There is normal left ventricular wall  thickness. LV ejection fraction = 55-60%. The left ventricular wall motion is  normal.   -  RIGHT VENTRICLE  The right ventricle is normal in size and function.   LEFT ATRIUM  The left atrial size is normal.   RIGHT ATRIUM  Right atrial size is normal. Injection of agitated saline showed no right-to-  left shunt.  -  AORTIC VALVE  There is trivial aortic valve thickening. There is no aortic stenosis. There  is no aortic regurgitation.  -  MITRAL VALVE  There is trivial mitral valve thickening. There is trace mitral regurgitation.   -  TRICUSPID VALVE  The tricuspid valve is normal in structure and function. No tricuspid  regurgitation.  -  PULMONIC VALVE  Structurally normal pulmonic valve. There is no pulmonic valvular  regurgitation. There is no pulmonic valvular stenosis.  -  ARTERIES  The aortic sinus is normal size.  -  VENOUS  IVC size was normal.  -  EFFUSION  There is no pericardial effusion. There is no pleural effusion. No cardiac  tamponade.   Chest CTA 09/22/2022 IMPRESSION:  1. No acute intracranial abnormality.  2. Approximately 75% stenosis of the right carotid bifurcation.  3. Approximately 55% stenosis of the distal left common carotid  artery.  4. Increased tortuosity of the V1  segments of the bilateral  vertebral arteries with associated approximately 40% stenosis of the  V1 segment of the right vertebral artery.  5. Intracranial atherosclerotic disease without hemodynamically  significant stenosis.  6. Aortic atherosclerosis.   Aortic Atherosclerosis (ICD10-I70.0).    Electronically Signed    By: Pedro Earls M.D.    On: 09/22/2022 11:06  CT chest lung cancer screening COMPARISON:  09/17/2020   FINDINGS: Cardiovascular: Heart size appears within normal limits. No pericardial effusion. Aortic atherosclerosis.  Coronary artery calcifications.   Mediastinum/Nodes: No enlarged mediastinal, hilar, or axillary lymph nodes. Thyroid gland, trachea, and esophagus demonstrate no significant findings.   Lungs/Pleura: Mild centrilobular emphysema with diffuse bronchial wall thickening. No pleural effusion, airspace consolidation or atelectasis. Small calcified and noncalcified lung nodules are noted. The largest noncalcified nodule is in the subpleural right upper lobe with a mean derived diameter of 2.9 mm. No suspicious lung nodules identified at this time.   Upper Abdomen: No acute findings within the imaged portions of the upper abdomen.   Musculoskeletal: Mild thoracic spondylosis. No acute or suspicious osseous findings.   IMPRESSION: 1. Lung-RADS 2, benign appearance or behavior. Continue annual screening with low-dose chest CT without contrast in 12 months. 2. Coronary artery calcifications. 3. Aortic Atherosclerosis (ICD10-I70.0) and Emphysema (ICD10-J43.9).     Electronically Signed   By: Kerby Moors M.D.   On: 10/02/2021 08:43     January 18, 2020 IMPRESSIONS   1. Left ventricular ejection fraction, by estimation, is 55 to 60%. The  left ventricle has normal function. The left ventricle has no regional  wall motion abnormalities. Left ventricular diastolic parameters are  consistent with Grade I diastolic   dysfunction (impaired relaxation).   2. Right ventricular systolic function is normal. The right ventricular  size is normal.   3. The mitral valve is normal in structure and function. No evidence of  mitral valve regurgitation. No evidence of mitral stenosis.   4. The aortic valve is normal in structure and function. Aortic valve  regurgitation is not visualized. No aortic stenosis is present.   5. There is no obvious atheromatous plaque identified in the visible  portions of the ascending aorta or aortic arch.   6. The inferior vena cava is normal in size with greater than 50%  respiratory variability, suggesting right atrial pressure of 3 mmHg.   Comparison(s): No prior Echocardiogram.   FINDINGS   Left Ventricle: Left ventricular ejection fraction, by estimation, is 55  to 60%. The left ventricle has normal function. The left ventricle has no  regional wall motion abnormalities. The left ventricular internal cavity  size was normal in size. There is   no left ventricular hypertrophy. Left ventricular diastolic parameters  are consistent with Grade I diastolic dysfunction (impaired relaxation).  Normal left ventricular filling pressure.   Right Ventricle: The right ventricular size is normal. No increase in  right ventricular wall thickness. Right ventricular systolic function is  normal.   Left Atrium: Left atrial size was normal in size.   Right Atrium: Right atrial size was normal in size.   Pericardium: There is no evidence of pericardial effusion.   Mitral Valve: The mitral valve is normal in structure and function. Normal  mobility of the mitral valve leaflets. No evidence of mitral valve  regurgitation. No evidence of mitral valve stenosis.   Tricuspid Valve: The tricuspid valve is normal in structure. Tricuspid  valve regurgitation is not demonstrated. No evidence of tricuspid  stenosis.   Aortic Valve: The aortic valve is normal in structure and function. Aortic   valve regurgitation is not visualized. No aortic stenosis is present.   Pulmonic Valve: The pulmonic valve was normal in structure. Pulmonic valve  regurgitation is not visualized. No evidence of pulmonic stenosis.   Aorta: There is no obvious atheromatous plaque identified in the visible  portions of the ascending aorta or aortic arch. The aortic root is normal  in size and structure and the aortic root, ascending aorta  and aortic arch  are all structurally normal,  with no evidence of dilitation or obstruction.   Venous: The inferior vena cava is normal in size with greater than 50%  respiratory variability, suggesting right atrial pressure of 3 mmHg.   IAS/Shunts: No atrial level shunt detected by color flow Doppler.       Recent Labs: No results found for requested labs within last 365 days.  Recent Lipid Panel    Component Value Date/Time   CHOL 242 (H) 09/17/2022 1101   TRIG 69 09/17/2022 1101   HDL 65 09/17/2022 1101   CHOLHDL 3.7 09/17/2022 1101   CHOLHDL 5.7 04/20/2015 1138   VLDL 34 04/20/2015 1138   LDLCALC 165 (H) 09/17/2022 1101   LDLDIRECT 246 (H) 10/31/2015 1444    Physical Exam:    VS:  BP 90/60 (BP Location: Right Arm, Patient Position: Sitting, Cuff Size: Normal)   Pulse 78   Ht _0  (1.549 m)   Wt 140 lb (63.5 kg)   BMI 26.45 kg/m     Wt Readings from Last 3 Encounters:  09/24/22 140 lb (63.5 kg)  09/09/22 139 lb (63 kg)  12/25/21 144 lb 12.8 oz (65.7 kg)     GEN: Well nourished, well developed in no acute distress HEENT: Normal NECK: No JVD; No carotid bruits LYMPHATICS: No lymphadenopathy CARDIAC: S1S2 noted,RRR, no murmurs, rubs, gallops RESPIRATORY:  Clear to auscultation without rales, wheezing or rhonchi  ABDOMEN: Soft, non-tender, non-distended, +bowel sounds, no guarding. EXTREMITIES: No edema, No cyanosis, no clubbing MUSCULOSKELETAL:  No deformity  SKIN: Warm and dry NEUROLOGIC:  Alert and oriented x 3, non-focal PSYCHIATRIC:   Normal affect, good insight  ASSESSMENT:    1. Syncope and collapse   2. Chronic obstructive pulmonary disease, unspecified COPD type ([place])   3. Hypertension, essential, benign   4. CKD (chronic kidney disease), stage II   5. Mixed hyperlipidemia   6. TIA (transient ischemic attack)   7. Postural dizziness with presyncope   8. History of CVA (cerebrovascular accident)    PLAN:    I have reviewed the recent stress test- no need for further testing.  Because she has had recent presynocpe episodes I will place a monitor on the patient with her hx of transient second degree AV block.  She is a pending carotid artery surgery.   She will also need to seen by pulmonary for her COPD will refer her.  Recent cva - continue current medication Continue statin  The patient is in agreement with the above plan. The patient left the office in stable condition.  The patient will follow up in 1 year or sooner if needed.   Medication Adjustments/Labs and Tests Ordered: Current medicines are reviewed at length with the patient today.  Concerns regarding medicines are outlined above.  Orders Placed This Encounter  Procedures   Ambulatory referral to Pulmonology   LONG TERM MONITOR-LIVE TELEMETRY (3-14 DAYS)    No orders of the defined types were placed in this encounter.    Patient Instructions  Medication Instructions:  Your physician recommends that you continue on your current medications as directed. Please refer to the Current Medication list given to you today.  *If you need a refill on your cardiac medications before your next appointment, please call your pharmacy*   Lab Work: NONE If you have labs (blood work) drawn today and your tests are completely normal, you will receive your results only by: [facility] (if you have MyChart) OR  A paper copy in the mail If you have any lab test that is abnormal or we need to change your treatment, we will call you to review the  results.   Testing/Procedures: ZIO AT Long term monitor-Live Telemetry  Your physician has requested you wear a ZIO patch monitor for 14 days.  This is a single patch monitor. [employer] supplies one patch monitor per enrollment. Additional  stickers are not available.  Please do not apply patch if you will be having a Nuclear Stress Test, Echocardiogram, Cardiac CT, MRI,  or Chest Xray during the period you would be wearing the monitor. The patch cannot be worn during  these tests. You cannot remove and re-apply the ZIO AT patch monitor.  Your ZIO patch monitor will be mailed 3 day USPS to your address on file. It may take 3-5 days to  receive your monitor after you have been enrolled.  Once you have received your monitor, please review the enclosed instructions. Your monitor has  already been registered assigning a specific monitor serial # to you.   Billing and Patient Assistance Program information  Theodore Demark has been supplied with any insurance information on record for billing. [employer] offers a sliding scale Patient Assistance Program for patients without insurance, or whose  insurance does not completely cover the cost of the ZIO patch monitor. You must apply for the  Patient Assistance Program to qualify for the discounted rate. To apply, call [employer] at [phone],  select option 4, select option 2 , ask to apply for the Patient Assistance Program, (you can request an  interpreter if needed). [employer] will ask your household income and how many people are in your  household. [employer] will quote your out-of-pocket cost based on this information. They will also be able  to set up a 12 month interest free payment plan if needed.  Applying the monitor   Shave hair from upper left chest.  Hold the abrader disc by orange tab. Rub the abrader in 40 strokes over left upper chest as indicated in  your monitor instructions.  Clean area with 4 enclosed alcohol pads. Use all pads to ensure  the area is cleaned thoroughly. Let  dry.  Apply patch as indicated in monitor instructions. Patch will be placed under collarbone on left side of  chest with arrow pointing upward.  Rub patch adhesive wings for 2 minutes. Remove the white label marked "1". Remove the white label  marked "2". Rub patch adhesive wings for 2 additional minutes.  While looking in a mirror, press and release button in center of patch. A small green light will flash 3-4  times. This will be your only indicator that the monitor has been turned on.  Do not shower for the first 24 hours. You may shower after the first 24 hours.  Press the button if you feel a symptom. You will hear a small click. Record Date, Time and Symptom in  the Patient Log.   Starting the Gateway  In your kit there is a Hydrographic surveyor box the size of a cellphone. This is Airline pilot. It transmits all your  recorded data to [facility]. This box must always stay within 10 feet of you. Open the box and push the *  button. There will be a light that blinks orange and then green a few times. When the light stops  blinking, the Gateway is connected to the ZIO patch. Call [employer] at [phone] to confirm your monitor is transmitting.  Returning your monitor  Remove your patch and place it inside the [place]. In the lower half of the Gateway there is a white  bag with prepaid postage on it. Place Gateway in bag and seal. Mail package back to [place] as soon as  possible. Your physician should have your final report approximately 7 days after you have mailed back  your monitor. Call [facility] at [phone] if you have questions regarding your ZIO AT  patch monitor. Call them immediately if you see an orange light blinking on your monitor.  If your monitor falls off in less than 4 days, contact our Monitor department at [phone]. If your  monitor becomes loose or falls off after 4 days call [employer] at [phone]  for suggestions on  securing your monitor    Follow-Up: At [facility], you and your health needs are our priority.  As part of our continuing mission to provide you with exceptional heart care, we have created designated Provider Care Teams.  These Care Teams include your primary Cardiologist (physician) and Advanced Practice Providers (APPs -  Physician Assistants and Nurse Practitioners) who all work together to provide you with the care you need, when you need it.  We recommend signing up for the patient portal called "MyChart".  Sign up information is provided on this After Visit Summary.  MyChart is used to connect with patients for Virtual Visits (Telemedicine).  Patients are able to view lab/test results, encounter notes, upcoming appointments, etc.  Non-urgent messages can be sent to your provider as well.   To learn more about what you can do with MyChart, go to [email or web address].    Your next appointment:   3 month(s)  The format for your next appointment:   In Person  Provider:   Berniece Salines, DO      Adopting a Healthy Lifestyle.  Know what a healthy weight is for you (roughly BMI <25) and aim to maintain this   Aim for 7+ servings of fruits and vegetables daily   65-80+ fluid ounces of water or unsweet tea for healthy kidneys   Limit to max 1 drink of alcohol per day; avoid smoking/tobacco   Limit animal fats in diet for cholesterol and heart health - choose grass fed whenever available   Avoid highly processed foods, and foods high in saturated/trans fats   Aim for low stress - take time to unwind and care for your mental health   Aim for 150 min of moderate intensity exercise weekly for heart health, and weights twice weekly for bone health   Aim for 7-9 hours of sleep daily   When it comes to diets, agreement about the perfect plan isnt easy to find, even among the experts. Experts at the [employer] developed an idea known  as the Healthy Eating Plate. Just imagine a plate divided into logical, healthy portions.   The emphasis is on diet quality:   Load up on vegetables and fruits - one-half of your plate: Aim for color and variety, and remember that potatoes dont count.   Go for whole grains - one-quarter of your plate: Whole wheat, barley, wheat berries, quinoa, oats, brown rice, and foods made with them. If you want pasta, go with whole wheat pasta.   Protein power - one-quarter of your plate: Fish, chicken, beans, and nuts are all healthy, versatile protein sources. Limit red meat.   The diet, however, does go beyond the plate, offering a  few other suggestions.   Use healthy plant oils, such as olive, canola, soy, corn, sunflower and peanut. Check the labels, and avoid partially hydrogenated oil, which have unhealthy trans fats.   If youre thirsty, drink water. Coffee and tea are good in moderation, but skip sugary drinks and limit milk and dairy products to one or two daily servings.   The type of carbohydrate in the diet is more important than the amount. Some sources of carbohydrates, such as vegetables, fruits, whole grains, and beans-are healthier than others.   Finally, stay active  Signed, Berniece Salines, DO  09/24/2022 10:11 PM    [facility]

## 2022-09-27 DIAGNOSIS — R55 Syncope and collapse: Secondary | ICD-10-CM

## 2022-10-02 DIAGNOSIS — Z79891 Long term (current) use of opiate analgesic: Secondary | ICD-10-CM | POA: Diagnosis not present

## 2022-10-02 DIAGNOSIS — M5416 Radiculopathy, lumbar region: Secondary | ICD-10-CM | POA: Diagnosis not present

## 2022-10-02 DIAGNOSIS — R413 Other amnesia: Secondary | ICD-10-CM | POA: Diagnosis not present

## 2022-10-02 DIAGNOSIS — G894 Chronic pain syndrome: Secondary | ICD-10-CM | POA: Diagnosis not present

## 2022-10-02 DIAGNOSIS — K5903 Drug induced constipation: Secondary | ICD-10-CM | POA: Diagnosis not present

## 2022-10-07 DIAGNOSIS — I6529 Occlusion and stenosis of unspecified carotid artery: Secondary | ICD-10-CM | POA: Insufficient documentation

## 2022-10-07 DIAGNOSIS — Z7982 Long term (current) use of aspirin: Secondary | ICD-10-CM | POA: Diagnosis not present

## 2022-10-07 DIAGNOSIS — Z87891 Personal history of nicotine dependence: Secondary | ICD-10-CM | POA: Diagnosis not present

## 2022-10-07 DIAGNOSIS — Z7902 Long term (current) use of antithrombotics/antiplatelets: Secondary | ICD-10-CM | POA: Diagnosis not present

## 2022-10-07 DIAGNOSIS — I6521 Occlusion and stenosis of right carotid artery: Secondary | ICD-10-CM | POA: Diagnosis not present

## 2022-10-09 ENCOUNTER — Ambulatory Visit: Payer: Medicaid Other | Admitting: Cardiology

## 2022-10-10 ENCOUNTER — Other Ambulatory Visit: Payer: Medicaid Other

## 2022-10-17 ENCOUNTER — Ambulatory Visit (INDEPENDENT_AMBULATORY_CARE_PROVIDER_SITE_OTHER): Payer: Medicaid Other | Admitting: Internal Medicine

## 2022-10-17 ENCOUNTER — Encounter: Payer: Self-pay | Admitting: Internal Medicine

## 2022-10-17 ENCOUNTER — Ambulatory Visit (INDEPENDENT_AMBULATORY_CARE_PROVIDER_SITE_OTHER): Payer: Medicaid Other

## 2022-10-17 VITALS — BP 140/78 | HR 81 | Temp 97.7°F | Ht 60.0 in | Wt 141.0 lb

## 2022-10-17 DIAGNOSIS — Z87891 Personal history of nicotine dependence: Secondary | ICD-10-CM | POA: Diagnosis not present

## 2022-10-17 DIAGNOSIS — R058 Other specified cough: Secondary | ICD-10-CM

## 2022-10-17 DIAGNOSIS — R059 Cough, unspecified: Secondary | ICD-10-CM | POA: Diagnosis not present

## 2022-10-17 MED ORDER — ACETAMINOPHEN-CODEINE 300-30 MG PO TABS: 1.0000 | ORAL_TABLET | ORAL | 0 refills | Status: AC | PRN

## 2022-10-17 MED ORDER — FAMOTIDINE 20 MG PO TABS: ORAL_TABLET | ORAL | 11 refills | Status: AC

## 2022-10-17 MED ORDER — PREDNISONE 10 MG PO TABS: ORAL_TABLET | ORAL | 0 refills | Status: DC

## 2022-10-17 NOTE — Assessment & Plan Note (Addendum)
Quit 05/2022 - Spirometry 11/120/15  FEV1 1.45 (62%)  Ratio 0.77  with 11% resp to saba @ at wt 128 - try off stiolto/flovent 10/17/2022 due to refractory cough > albuterol prn only   Symptoms that get worse p smoking cessation, esp cough, are not likely to be asthma or copd related - see UACS  In meantime congratulated on not smoking   Low-dose CT lung cancer screening is recommended for patients who are 46-64 years of age with a 20+ pack-year history of smoking and who are currently smoking or quit <=15 years ago. No coughing up blood  No unintentional weight loss of > 15 pounds in the last 6 months - pt is eligible for scanning yearly until  2038 by present guidelines > already in program      Each maintenance medication was reviewed in detail including emphasizing most importantly the difference between maintenance and prns and under what circumstances the prns are to be triggered using an action plan format where appropriate.  Total time for H and P, chart review, counseling, reviewing hfa device(s) and generating customized AVS unique to this office visit / same day charting  > 60 min new pt eval for refractory resp symptoms of ? Etiology dating back to 2015

## 2022-10-17 NOTE — Progress Notes (Signed)
Diana [name], female    DOB: 12/10/1955   MRN: [id number]   Brief patient profile:  66  yowf dx as copd in 2015 with cough /sob and d/c smoking 05/30/22 >>> cough is worse since then  referred to pulmonary clinic 10/17/2022 by Berniece Salines  for preop clearance R CEA but can't stop coughing   2015 pfts are not typical of copd  but restrictive only with  wt  128       History of Present Illness  10/17/2022  Pulmonary/ 1st office eval/Mansour Balboa cough worse for a year while on stiolto / flovent  Chief Complaint  Patient presents with   Consult    SOB and coughing x 2 months.  COPD dx in 2010.  Dyspnea:  grocery  store x 30 min and uses scooter for more  Cough: ? after supper worse Sleep: has electric bed cough wakes nightly  SABA use: none  R lower lateral CW pain from coughing fits x sev weeks  No obvious day to day or daytime pattern/variability or assoc excess/ purulent sputum or mucus plugs or hemoptysis or  chest tightness, subjective wheeze or overt sinus or hb symptoms.     Also denies any obvious fluctuation of symptoms with weather or environmental changes or other aggravating or alleviating factors except as outlined above   No unusual exposure hx or h/o childhood pna/ asthma or knowledge of premature birth.  Current Allergies, Complete Past Medical History, Past Surgical History, Family History, and Social History were reviewed in [employer] record.  ROS  The following are not active complaints unless bolded Hoarseness, sore throat/ sense of pnd dysphagia, dental problems, itching, sneezing,  nasal congestion or discharge of excess mucus or purulent secretions, ear ache,   fever, chills, sweats, unintended wt loss or wt gain, classically pleuritic or exertional cp,  orthopnea pnd or arm/hand swelling  or leg swelling, presyncope, palpitations, abdominal pain, anorexia, nausea, vomiting, diarrhea  or change in bowel habits or change in bladder habits, change in  stools or change in urine, dysuria, hematuria,  rash, arthralgias, visual complaints, headache, numbness, weakness or ataxia or problems with walking or coordination,  change in mood or  memory.           Past Medical History:  Diagnosis Date   Acute gout involving toe of left foot 10/12/2019   Anxiety    Asthma    Barrett esophagus    "don't know that I've ever been stretched" (08/17/2013)   Bilateral leg edema 07/16/2020   Choledocholithiasis with acute cholecystitis    Chronic lower back pain    COPD (chronic obstructive pulmonary disease) ([place])    Craniosynostosis    congenital   DDD (degenerative disc disease)    Family history of anesthesia complication    "PONV; both parents" (08/17/2013)   GERD (gastroesophageal reflux disease)    H/O hiatal hernia    H/O: hysterectomy 09/22/2014   By her report at age 66. Has never been on Norma replacement therapy. 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 Switched to lovastatin b/c its $4.     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 We'll try adding estradiol cream 3 times a week. I would give this for 8 weeks and she's not having some improvement, I would have her return to clinic. If she is having improvement she can decrease the frequency of her applications to once or twice a week. If she's not had improvement then I would consider increasing him to 7 days a week. 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 6 mL 1   allopurinol (ZYLOPRIM) 100 MG tablet Take 1 tablet (100 mg total) by mouth daily. 90 tablet 2   AMITIZA 24 MCG capsule Take 24 mcg by mouth daily.     aspirin EC 325 MG tablet Take 325 mg by mouth daily.     aspirin-acetaminophen-caffeine (EXCEDRIN MIGRAINE) 250-250-65 MG tablet Take 2 tablets by mouth every 6 (six) hours as needed for headache.     Calcium Carbonate-Vitamin D 600-200 MG-UNIT TABS 1 tablet.     clopidogrel (PLAVIX) 75 MG tablet Take 75 mg by mouth daily.     co-enzyme Q-10 30 MG capsule Take 100 mg by mouth daily.     diclofenac sodium (VOLTAREN) 1 % GEL Apply 2 g topically 4 (four) times daily as needed. 100 g 5   esomeprazole (NEXIUM) 40 MG capsule TAKE 1 CAPSULE BY MOUTH DAILY AT 12 NOON 90 capsule 1   FLOVENT HFA 220 MCG/ACT inhaler INHALE 2 PUFFS INTO THE LUNGS 2 TIMES DAILY (Patient taking differently: Inhale 2 puffs into the lungs 2 (two) times daily as needed (if coughing or wheezing.).) 12 g 2   fluticasone (FLONASE) 50 MCG/ACT nasal spray Place 1 spray into both nostrils daily.     lidocaine (HM LIDOCAINE PATCH) 4 % Place 1 patch onto the skin daily. 30 patch 2   linaclotide (LINZESS) 145 MCG CAPS capsule TAKE 1 CAPSULE BY MOUTH DAILY BEFORE BREAKFAST 30 capsule 5   olmesartan (BENICAR) 20 MG tablet TAKE 1 TABLET BY MOUTH DAILY 30 tablet 6   Omega-3 Fatty Acids (OMEGA III EPA+DHA PO) Take 1 capsule by mouth in the morning and at bedtime.     Pitavastatin Calcium 1 MG TABS Take 1 tablet (1 mg total) by mouth 2 (two) times a week. 30 tablet 6   STIOLTO RESPIMAT 2.5-2.5 MCG/ACT AERS INHALE 2 PUFFS INTO THE LUNGS DAILY 4 g 3    SUMAtriptan (IMITREX) 50 MG tablet Take 1 tablet (50 mg total) by mouth every 2 (two) hours as needed for migraine. May repeat in 2 hours if headache persists or recurs. 10 tablet 3   traMADol (ULTRAM) 50 MG tablet Take 100 mg by mouth 3 (three) times daily.     Turmeric 500 MG CAPS Take 2 tablets by mouth daily.     venlafaxine XR (EFFEXOR-XR) 37.5 MG 24 hr capsule Take 1 capsule (37.5 mg total) by mouth daily with breakfast. 30 capsule 3   VENTOLIN HFA  108 (90 Base) MCG/ACT inhaler INHALE 2 PUFFS INTO THE LUNGS EVERY 6 HOURS AS NEEDED FOR WHEEZING 18 g 2   No facility-administered medications prior to visit.     Objective:     BP (!) 140/78 (BP Location: Left Arm, Patient Position: Sitting, Cuff Size: Normal)   Pulse 81   Temp 97.7 F (36.5 C) (Oral)   Ht 5' (1.524 m)   Wt 141 lb (64 kg)   SpO2 97%   BMI 27.54 kg/m   SpO2: 97 %  Amb wf with shrill upper airway cough pattern    HEENT : Oropharynx  clear/ full dentures     Nasal turbinates nl    NECK :  without  apparent JVD/ palpable Nodes/TM    LUNGS: no acc muscle use,  Nl contour chest which is clear to A and P bilaterally without cough on insp or exp maneuvers   CV:  RRR  no s3 or murmur or increase in P2, and no edema   ABD:  soft and nontender with nl inspiratory excursion in the supine position. No bruits or organomegaly appreciated   MS:  Nl gait/ ext warm without deformities Or obvious joint restrictions  calf tenderness, cyanosis or clubbing    SKIN: warm and dry without lesions    NEURO:  alert, approp, nl sensorium with  no motor or cerebellar deficits apparent.    CXR PA and Lateral:   10/17/2022 :    I personally reviewed images and impression is as follows:     Mild T kyphosis, minimal copd changes/ no effusion or obvious rib fx     Assessment   Upper airway cough syndrome Onset 2015, worse with smoking cessation 05/2022  -  PFTs 2015 restrictive changes only  -  stiolto/flovent d/c'd 10/17/2022  and rec prn saba only  - cyclical cough rx 23/76/2831 >>>   Upper airway cough syndrome (previously labeled PNDS),  is so named because it's frequently impossible to sort out how much is  CR/sinusitis with freq throat clearing (which can be related to primary GERD)   vs  causing  secondary (" extra esophageal")  GERD from wide swings in gastric pressure that occur with throat clearing, often  promoting self use of mint and menthol lozenges that reduce the lower esophageal sphincter tone and exacerbate the problem further in a cyclical fashion.   These are the same pts (now being labeled as having "irritable larynx syndrome" by some cough centers) who not infrequently have a history of having failed to tolerate ace inhibitors,  dry powder inhalers which made her worse but also true for high dose ics, esp fluticasone)  or biphosphonates or report having atypical/extraesophageal reflux symptoms that don't respond to standard doses of PPI  and are easily confused as having aecopd or asthma flares by even experienced allergists/ pulmonologists (myself included).   Of the three most common causes of  Sub-acute / recurrent or chronic cough, only one (GERD)  can actually contribute to/ trigger  the other two (asthma and post nasal drip syndrome)  and perpetuate the cylce of cough.  While not intuitively obvious, many patients with chronic low grade reflux do not cough until there is a primary insult that disturbs the protective epithelial barrier and exposes sensitive nerve endings.   This is typically viral but can due to PNDS and  latter more likely to  apply here.     >>>   The point is that once this occurs, it  is difficult to eliminate the cycle  using anything but a maximally effective acid suppression regimen at least in the short run, accompanied by an appropriate diet to address non acid GERD and control / eliminate the cough itself for at least 3 days,  eliminate pnds with 1st gen H1 blockers per  guidelines  and >>> also so added 6 day taper off  Prednisone starting at 40 mg per day in case of component of Th-2 driven upper or lower airways inflammation (if cough responds short term only to relapse before return while will on full rx for uacs (as above), then  that would point to allergic rhinitis/ asthma or eos bronchitis as alternative dx)        Former cigarette smoker Quit 05/2022 - Spirometry 11/120/15  FEV1 1.45 (62%)  Ratio 0.77  with 11% resp to saba @ at wt 128 - try off stiolto/flovent 10/17/2022 due to refractory cough > albuterol prn only   Symptoms that get worse p smoking cessation, esp cough, are not likely to be asthma or copd related - see UACS  In meantime congratulated on not smoking   Low-dose CT lung cancer screening is recommended for patients who are 63-53 years of age with a 20+ pack-year history of smoking and who are currently smoking or quit <=15 years ago. No coughing up blood  No unintentional weight loss of > 15 pounds in the last 6 months - pt is eligible for scanning yearly until  2038 by present guidelines > already in program      Each maintenance medication was reviewed in detail including emphasizing most importantly the difference between maintenance and prns and under what circumstances the prns are to be triggered using an action plan format where appropriate.  Total time for H and P, chart review, counseling, reviewing hfa device(s) and generating customized AVS unique to this office visit / same day charting  > 60 min new pt eval for refractory resp symptoms of ? Etiology dating back to 2015           Christinia Gully, MD 10/17/2022

## 2022-10-17 NOTE — Patient Instructions (Addendum)
Stop flovent,fish oil, stiolto and calcium carbonate   Ok for surgery on R Carotid once cough clears  For breathing > albuterol as needed for now  The key to effective treatment for your cough is eliminating the non-stop cycle of cough you're stuck in long enough to let your airway heal completely and then see if there is anything still making you cough once you stop the cough suppression, but this should take no more than 5 days to figure out  Take delsym two tsp every 12 hours and supplement if needed with  Tylenol #3   up to 1-2 every 4 hours to suppress the urge to cough. Swallowing water and/or using ice chips/non mint and menthol containing candies (such as lifesavers or sugarless jolly ranchers) are also effective.  You should rest your voice and avoid activities that you know make you cough.  Once you have eliminated the cough for 3 straight days try reducing the Tylenol #3 first,  then the delsym as tolerated.     Prednisone 10 mg take  4 each am x 2 days,   2 each am x 2 days,  1 each am x 2 days and stop (this is to eliminate allergies and inflammation from coughing)  Nexium 40 mg Take 30-60 min before first meal of the day and Pepcid 20 mg one after supper  plus chlorpheniramine 4 mg x 2 an hour before bedtime (both available over the counter)  until cough is completely gone for at least a week without the need for cough suppression  GERD (REFLUX)  is an extremely common cause of respiratory symptoms, many times with no significant heartburn at all.    It can be treated with medication, but also with lifestyle changes including avoidance of late meals, excessive alcohol, smoking cessation, and avoid fatty foods, chocolate, peppermint, colas, red wine, and acidic juices such as orange juice.  NO MINT OR MENTHOL PRODUCTS SO NO COUGH DROPS  USE HARD CANDY INSTEAD (jolley ranchers or Stover's or Lifesavers (all available in sugarless versions) NO OIL BASED VITAMINS - use powdered  substitutes.  Pulmonary follow up is as needed

## 2022-10-17 NOTE — Assessment & Plan Note (Addendum)
Onset 2015, worse with smoking cessation 05/2022  -  PFTs 2015 restrictive changes only  -  stiolto/flovent d/c'd 10/17/2022 and rec prn saba only  - cyclical cough rx 09/32/6712 >>>   Upper airway cough syndrome (previously labeled PNDS),  is so named because it's frequently impossible to sort out how much is  CR/sinusitis with freq throat clearing (which can be related to primary GERD)   vs  causing  secondary (" extra esophageal")  GERD from wide swings in gastric pressure that occur with throat clearing, often  promoting self use of mint and menthol lozenges that reduce the lower esophageal sphincter tone and exacerbate the problem further in a cyclical fashion.   These are the same pts (now being labeled as having "irritable larynx syndrome" by some cough centers) who not infrequently have a history of having failed to tolerate ace inhibitors,  dry powder inhalers which made her worse but also true for high dose ics, esp fluticasone)  or biphosphonates or report having atypical/extraesophageal reflux symptoms that don't respond to standard doses of PPI  and are easily confused as having aecopd or asthma flares by even experienced allergists/ pulmonologists (myself included).   Of the three most common causes of  Sub-acute / recurrent or chronic cough, only one (GERD)  can actually contribute to/ trigger  the other two (asthma and post nasal drip syndrome)  and perpetuate the cylce of cough.  While not intuitively obvious, many patients with chronic low grade reflux do not cough until there is a primary insult that disturbs the protective epithelial barrier and exposes sensitive nerve endings.   This is typically viral but can due to PNDS and  latter more likely to  apply here.     >>>   The point is that once this occurs, it is difficult to eliminate the cycle  using anything but a maximally effective acid suppression regimen at least in the short run, accompanied by an appropriate diet to address  non acid GERD and control / eliminate the cough itself for at least 3 days,  eliminate pnds with 1st gen H1 blockers per guidelines  and >>> also so added 6 day taper off  Prednisone starting at 40 mg per day in case of component of Th-2 driven upper or lower airways inflammation (if cough responds short term only to relapse before return while will on full rx for uacs (as above), then  that would point to allergic rhinitis/ asthma or eos bronchitis as alternative dx)

## 2022-10-21 DIAGNOSIS — I6521 Occlusion and stenosis of right carotid artery: Secondary | ICD-10-CM | POA: Diagnosis not present

## 2022-10-21 NOTE — Progress Notes (Signed)
Spoke with Diana [name] and notified of results per Dr. Melvyn Novas. Diana [name] verbalized understanding and denied any questions. She is scheduled for LDCT 11/27/22.

## 2022-10-27 DIAGNOSIS — J449 Chronic obstructive pulmonary disease, unspecified: Secondary | ICD-10-CM | POA: Diagnosis not present

## 2022-10-27 DIAGNOSIS — M109 Gout, unspecified: Secondary | ICD-10-CM | POA: Diagnosis not present

## 2022-10-27 DIAGNOSIS — E785 Hyperlipidemia, unspecified: Secondary | ICD-10-CM | POA: Diagnosis not present

## 2022-10-27 DIAGNOSIS — I6521 Occlusion and stenosis of right carotid artery: Secondary | ICD-10-CM | POA: Diagnosis not present

## 2022-10-27 DIAGNOSIS — Z87891 Personal history of nicotine dependence: Secondary | ICD-10-CM | POA: Diagnosis not present

## 2022-10-27 DIAGNOSIS — G43909 Migraine, unspecified, not intractable, without status migrainosus: Secondary | ICD-10-CM | POA: Diagnosis not present

## 2022-10-28 DIAGNOSIS — I6521 Occlusion and stenosis of right carotid artery: Secondary | ICD-10-CM | POA: Diagnosis not present

## 2022-10-28 DIAGNOSIS — Z9889 Other specified postprocedural states: Secondary | ICD-10-CM | POA: Diagnosis not present

## 2022-10-31 ENCOUNTER — Ambulatory Visit: Payer: Medicaid Other | Admitting: Family Medicine

## 2022-10-31 VITALS — BP 163/89 | HR 91 | Wt 141.1 lb

## 2022-10-31 DIAGNOSIS — I10 Essential (primary) hypertension: Secondary | ICD-10-CM | POA: Diagnosis not present

## 2022-10-31 MED ORDER — OLMESARTAN MEDOXOMIL 20 MG PO TABS: 20.0000 mg | ORAL_TABLET | Freq: Every day | ORAL | 6 refills | Status: DC

## 2022-10-31 NOTE — Assessment & Plan Note (Signed)
-  BP 180/90, on repeat improved to 163/89 but still not at goal. Likely in the setting of the fact that she ran out of her medication -refill of olmesartan 20 mg sent to desired pharmacy -recent metabolic panel notable for electrolytes and renal function wnl  -extensive med rec conducted and updated appropriately  -instructed to record BP twice daily and bring in record to next visit -follow up on 12/11, consider increasing olmesartan dose if BP not at goal

## 2022-10-31 NOTE — Progress Notes (Signed)
    SUBJECTIVE:   CHIEF COMPLAINT / HPI:   Patient with history of hypertension presents with concerns regarding elevated blood pressure. Had a recent hospitalization for right carotid stenosis and was recommended to continue dual antiplatelet therapy which she is compliant on. During her hospital stay, she was followed by vascular surgery and has a follow up visit them. Reports compliance until a week ago when she ran out. Typically, BP systolic not over 518. Recently, her blood pressures now have been ranging around systolics [id number].  Per chart review, it seems that patient was previously on losartan and amlodipine but switched to only olmesartan due to patient preference. Currently she is taking olmesartan. Denies chest pain, dyspnea, leg swelling, vision changes and weakness.    OBJECTIVE:   BP (!) 163/89   Pulse 91   Wt 141 lb 2 oz (64 kg)   SpO2 100%   BMI 27.56 kg/m    General: Patient well-appearing, in no acute distress. CV: RRR, no murmurs or gallops auscultated Resp: CTAB, no wheezing, rales or rhonchi  Psych: mood appropriate, very pleasant   ASSESSMENT/PLAN:   Hypertension, essential, benign -BP 180/90, on repeat improved to 163/89 but still not at goal. Likely in the setting of the fact that she ran out of her medication -refill of olmesartan 20 mg sent to desired pharmacy -recent metabolic panel notable for electrolytes and renal function wnl  -extensive med rec conducted and updated appropriately  -instructed to record BP twice daily and bring in record to next visit -follow up on 12/11, consider increasing olmesartan dose if BP not at goal      Donney Dice, [place]

## 2022-10-31 NOTE — Patient Instructions (Signed)
It was great seeing you today!  Today we discussed your high blood pressure, this is likely due to not having your medication. I have sent in refills of your olmesartan. Please take this daily as prescribed. Please check your blood pressures daily and record this. Bring this recording in with you to your next visit.   If you have a systolic blood pressure (top number) of over 160 and you are having weakness then please go to the emergency department. If you are having chest pain or shortness of breath then please go to the emergency department.   Please follow up at your next scheduled appointment on Dec 11 at 4:10 pm with Dr. Rock Nephew, please make sure to arrive at least 15 minutes prior to your scheduled appointment time, if anything arises between now and then, please don't hesitate to contact our office.   Thank you for allowing [place] to be a part of your medical care!  Thank you, Dr. Larae Grooms  Also a reminder of our clinic's no-show policy. Please make sure to arrive at least 15 minutes prior to your scheduled appointment time. Please try to cancel before 24 hours if you are not able to make it. If you no-show for 2 appointments then you will be receiving a warning letter. If you no-show after 3 visits, then you may be at risk of being dismissed from our clinic. This is to ensure that everyone is able to be seen in a timely manner. Thank you, we appreciate your assistance with this!

## 2022-11-06 DIAGNOSIS — I672 Cerebral atherosclerosis: Secondary | ICD-10-CM | POA: Diagnosis not present

## 2022-11-06 DIAGNOSIS — R079 Chest pain, unspecified: Secondary | ICD-10-CM | POA: Diagnosis not present

## 2022-11-06 DIAGNOSIS — I959 Hypotension, unspecified: Secondary | ICD-10-CM | POA: Diagnosis not present

## 2022-11-06 DIAGNOSIS — Z79891 Long term (current) use of opiate analgesic: Secondary | ICD-10-CM | POA: Diagnosis not present

## 2022-11-06 DIAGNOSIS — Z9049 Acquired absence of other specified parts of digestive tract: Secondary | ICD-10-CM | POA: Diagnosis not present

## 2022-11-06 DIAGNOSIS — M5416 Radiculopathy, lumbar region: Secondary | ICD-10-CM | POA: Diagnosis not present

## 2022-11-06 DIAGNOSIS — K449 Diaphragmatic hernia without obstruction or gangrene: Secondary | ICD-10-CM | POA: Diagnosis not present

## 2022-11-06 DIAGNOSIS — I6523 Occlusion and stenosis of bilateral carotid arteries: Secondary | ICD-10-CM | POA: Diagnosis not present

## 2022-11-06 DIAGNOSIS — K5903 Drug induced constipation: Secondary | ICD-10-CM | POA: Diagnosis not present

## 2022-11-06 DIAGNOSIS — M2669 Other specified disorders of temporomandibular joint: Secondary | ICD-10-CM | POA: Diagnosis not present

## 2022-11-06 DIAGNOSIS — G894 Chronic pain syndrome: Secondary | ICD-10-CM | POA: Diagnosis not present

## 2022-11-06 DIAGNOSIS — R55 Syncope and collapse: Secondary | ICD-10-CM | POA: Diagnosis not present

## 2022-11-06 DIAGNOSIS — R0789 Other chest pain: Secondary | ICD-10-CM | POA: Diagnosis not present

## 2022-11-06 DIAGNOSIS — R413 Other amnesia: Secondary | ICD-10-CM | POA: Diagnosis not present

## 2022-11-07 ENCOUNTER — Encounter: Payer: Self-pay | Admitting: Family Medicine

## 2022-11-07 DIAGNOSIS — I471 Supraventricular tachycardia, unspecified: Secondary | ICD-10-CM | POA: Insufficient documentation

## 2022-11-07 DIAGNOSIS — R55 Syncope and collapse: Secondary | ICD-10-CM | POA: Diagnosis not present

## 2022-11-07 DIAGNOSIS — R079 Chest pain, unspecified: Secondary | ICD-10-CM | POA: Diagnosis not present

## 2022-11-07 DIAGNOSIS — R0789 Other chest pain: Secondary | ICD-10-CM | POA: Diagnosis not present

## 2022-11-07 DIAGNOSIS — R569 Unspecified convulsions: Secondary | ICD-10-CM | POA: Diagnosis not present

## 2022-11-07 DIAGNOSIS — Z9049 Acquired absence of other specified parts of digestive tract: Secondary | ICD-10-CM | POA: Diagnosis not present

## 2022-11-07 DIAGNOSIS — I447 Left bundle-branch block, unspecified: Secondary | ICD-10-CM | POA: Diagnosis not present

## 2022-11-07 DIAGNOSIS — K449 Diaphragmatic hernia without obstruction or gangrene: Secondary | ICD-10-CM | POA: Diagnosis not present

## 2022-11-08 NOTE — Progress Notes (Signed)
Cardiology Office Note:    Date:  11/08/2022   ID:  Dorthy Cooler, DOB September 05, 1956, MRN [id number]  PCP:  Alcus Dad, MD   [facility] Providers Cardiologist:  Berniece Salines, DO { Click to update primary MD,subspecialty MD or APP then REFRESH:1}    Referring MD: Alcus Dad, MD   Chief Complaint: ***  History of Present Illness:    KAMERON GLAZEBROOK is a *** 66 y.o. female with a hx of Hollenhorst plaque, HLD, HTN, and tobacco abuse, RBBB, CVA   Referred to cardiology and seen by Dr. Harriet Masson on 01/17/20 for preoperative cardiac valuation with concerns of dizziness and hyperlipidemia.  Cardiac monitor revealed predominant rhythm sinus rhythm, first-degree AV block, bundle branch block/IVCD present, minimum heart rate 29 bpm, with average HR 77 bpm.  6 SVT runs occurred longest lasting 10 beats, 1 episode of second-degree AV block lasting a total of 3 seconds. She was advised to discontinue her beta-blocker and was referred to EP.  Due to improvement off BB, no indication for pacemaker.  She was started on Livalo and Zetia due to significantly elevated LDL.   Seen March 20, 2020 at which time she was experiencing some leg swelling.  Ultrasound duplex ruled out DVT.  She was referred for evaluation of hyperlipidemia and started on Praluent.  At last cardiology clinic visit 09/24/2022 she reported 2 recent hospitalizations at [facility]  She had an echocardiogram which was reported to be low normal LVEF.  She then developed chest pain at home after her discharge and had a presyncope episode.  She was worked up for stroke, history of lacunar infarct.  During her workup she was noted to have carotid artery stenosis (75% in the right).  Dobutamine stress test was negative, echo was normal with no PFO.  Mikki Santee, no need for further testing of ischemia.  Monitor placed for history of transient second-degree AV block and recent presyncope.  She is pending a carotid artery surgery.  Needs to be  referred to pulmonary for COPD. Cardiac monitor placed for dizziness  revealed no evidence of AV block previously seen on monitor, evidence of paroxysmal supraventricular tachycardia which were symptomatic as well as rare premature atrial complexes.   Past Medical History:  Diagnosis Date   Acute gout involving toe of left foot 10/12/2019   Anxiety    Asthma    Barrett esophagus    "don't know that I've ever been stretched" (08/17/2013)   Bilateral leg edema 07/16/2020   Choledocholithiasis with acute cholecystitis    Chronic lower back pain    COPD (chronic obstructive pulmonary disease) ([place])    Craniosynostosis    congenital   DDD (degenerative disc disease)    Family history of anesthesia complication    "PONV; both parents" (08/17/2013)   GERD (gastroesophageal reflux disease)    H/O hiatal hernia    H/O: hysterectomy 09/22/2014   By her report at age 4. Has never been on Norma replacement therapy. 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 Switched to lovastatin b/c its $4.     Hypertension    Hyponatremia 08/28/2020   IBS (irritable bowel syndrome)    Kidney stones 07/22/2017   Migraines    "since age 26; probably twice/month" (08/17/2013)   PONV (postoperative nausea  and vomiting)    Postmenopausal atrophic vaginitis 09/22/2014   History of hysterectomy. We'll try adding estradiol cream 3 times a week. I would give this for 8 weeks and she's not having some improvement, I would have her return to clinic. If she is having improvement she can decrease the frequency of her applications to once or twice a week. If she's not had improvement then I would consider increasing him to 7 days a week. 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/10/2020   Shingles    Sinus pause 02/10/2020   Sleep apnea-like behavior 03/20/2020   Takes dietary supplements 07/28/2020    Past Surgical History:  Procedure Laterality Date   ABDOMINAL HYSTERECTOMY  1992   Total for chronic pelvic pain    APPENDECTOMY     BACK SURGERY  1990's   L4-L5 fusion @ [facility]  05/28/2019   fused C4-5-6, done in [place]  2007   Performed in [place]   "1st time got infected; 2nd time didn't take; fix the 3rd time" (08/17/2013)   POSTERIOR LUMBAR FUSION  1990's   "took piece off my hip" (08/17/2013)   TOTAL HIP ARTHROPLASTY Right 12/31/04   AVN after fall injury; performed by Ralene Cork; DePuy S-ROM total hip (metal on metal)   UPPER GASTROINTESTINAL ENDOSCOPY      Current Medications: No outpatient medications have been marked as taking for the 11/10/22 encounter (Appointment) with Emmaline Life, NP.     Allergies:   Penicillins, Demerol [meperidine], Doxycycline hyclate, Gabapentin, Sulfa antibiotics, Atorvastatin, Fluvastatin, Levaquin [levofloxacin], Lovastatin, No known allergies, Pitavastatin, Pravastatin, Rosuvastatin, Simvastatin, Benadryl [diphenhydramine hcl], Buprenorphine hcl, Cephalexin, Morphine and related, and Spiriva handihaler [tiotropium bromide monohydrate]   Social History   Socioeconomic History   Marital status: Divorced    Spouse name: Not on file   Number of children: 2   Years of education: Not on file   Highest education level: Not on file  Occupational History   Occupation: retired  Tobacco Use   Smoking status: Former    Packs/day: 0.50    Years: 32.00    Total pack years: 16.00    Types: Cigarettes    Quit date: 05/30/2022    Years since quitting: 0.4    Passive exposure: Past   Smokeless tobacco:  Never   Tobacco comments:    on chanix  Vaping Use   Vaping Use: Every day  Substance and Sexual Activity   Alcohol use: Yes    Alcohol/week: 0.0 standard drinks of alcohol    Comment: occasionally   Drug use: No   Sexual activity: Not Currently  Other Topics Concern   Not on file  Social History Narrative   Not on file   Social Determinants of Health   Financial Resource Strain: Not on file  Food Insecurity: Not on file  Transportation Needs: Not on file  Physical Activity: Not on file  Stress: Not on file  Social Connections: Not on file     Family History: The patient's ***family history includes Breast cancer in her maternal grandmother; Colon cancer in her paternal grandfather; Hypertension in her father and mother; Skin cancer in her father. There  is no history of Rectal cancer, Stomach cancer, or Esophageal cancer.  ROS:   Please see the history of present illness.    *** All other systems reviewed and are negative.  Labs/Other Studies Reviewed:    The following studies were reviewed today:  Cardiac monitor 10/29/22 Patient had a min HR of 62 bpm, max HR of 169 bpm, and avg HR of 90 bpm. Predominant underlying rhythm was Sinus Rhythm. QRS morphology changes were present throughout recording.    28 Supraventricular Tachycardia runs occurred, the run with the fastest interval lasting 7 beats with a max rate of 169 bpm, the longest lasting 14 beats with an avg rate of 109 bpm. Supraventricular Tachycardia was detected within +/- 45 seconds of symptomatic patient event(s). Isolated SVEs were rare (<1.0%), SVE Couplets  were rare (<1.0%), and SVE Triplets were rare (<1.0%). Isolated VEs were rare (<1.0%), and no VE Couplets or VE Triplets were present.   Symptoms associated with paroxysmal supraventricular tachycardia, rare premature atrial complex.   Conclusion: This study is remarkable for the following:                       1.  Symptomatic paroxysmal  supraventricular tachycardia.                       2.  Symptomatic rare premature atrial complex   Compared to prior monitor done.  No evidence of AV blocks.  Stress echo 09/08/22 SUMMARY  Normal left ventricular function at rest.  The estimated LV ejection fraction is 60-65% .  There were no segmental wall motion abnormalities at rest  Normal left ventricular function and global wall motion with stress.  There were no segmental wall motion abnormalities with dobutamine.  Negative dobutamine echocardiography for inducible ischemia at target  heart rate.    TTE 09/20/22 PROCEDURE  A two-dimensional transthoracic echocardiogram with color flow and Doppler was  performed. Image Quality: Technically adequate. Injection of agitated saline  contrast performed to evaluate for possible shunt.  -  SUMMARY  Normal echo doppler study  agitated saline study negative for shunt.  Injection of agitated saline contrast performed to evaluate for possible shunt   -  FINDINGS:  LEFT VENTRICLE  The left ventricular size is normal. There is normal left ventricular wall  thickness. LV ejection fraction = 55-60%. The left ventricular wall motion is  normal.   -  RIGHT VENTRICLE  The right ventricle is normal in size and function.   LEFT ATRIUM  The left atrial size is normal.   RIGHT ATRIUM  Right atrial size is normal. Injection of agitated saline showed no right-to-  left shunt.  -  AORTIC VALVE  There is trivial aortic valve thickening. There is no aortic stenosis. There  is no aortic regurgitation.  -  MITRAL VALVE  There is trivial mitral valve thickening. There is trace mitral regurgitation.   -  TRICUSPID VALVE  The tricuspid valve is normal in structure and function. No tricuspid  regurgitation.  -  PULMONIC VALVE  Structurally normal pulmonic valve. There is no pulmonic valvular  regurgitation. There is no pulmonic valvular stenosis.  -  ARTERIES  The aortic sinus is  normal size.  -  VENOUS  IVC size was normal.  -  EFFUSION  There is no pericardial effusion. There is no pleural effusion. No cardiac  tamponade.    Chest CTA 09/22/2022 IMPRESSION:  1. No acute intracranial abnormality.  2. Approximately 75% stenosis of the right carotid bifurcation.  3. Approximately 55% stenosis of the distal left common carotid  artery.  4. Increased tortuosity of the V1 segments of the bilateral  vertebral arteries with associated approximately 40% stenosis of the  V1 segment of the right vertebral artery.  5. Intracranial atherosclerotic disease without hemodynamically  significant stenosis.  6. Aortic atherosclerosis.   Aortic Atherosclerosis (ICD10-I70.0).    Electronically Signed    By: Pedro Earls M.D.    On: 09/22/2022 11:06   CT chest lung cancer screening 10/02/22 COMPARISON:  09/17/2020   FINDINGS: Cardiovascular: Heart size appears within normal limits. No pericardial effusion. Aortic atherosclerosis. Coronary artery calcifications.   Mediastinum/Nodes: No enlarged mediastinal, hilar, or axillary lymph nodes. Thyroid gland, trachea, and esophagus demonstrate no significant findings.   Lungs/Pleura: Mild centrilobular emphysema with diffuse bronchial wall thickening. No pleural effusion, airspace consolidation or atelectasis. Small calcified and noncalcified lung nodules are noted. The largest noncalcified nodule is in the subpleural right upper lobe with a mean derived diameter of 2.9 mm. No suspicious lung nodules identified at this time.   Upper Abdomen: No acute findings within the imaged portions of the upper abdomen.   Musculoskeletal: Mild thoracic spondylosis. No acute or suspicious osseous findings.   IMPRESSION: 1. Lung-RADS 2, benign appearance or behavior. Continue annual screening with low-dose chest CT without contrast in 12 months. 2. Coronary artery calcifications. 3. Aortic Atherosclerosis  (ICD10-I70.0) and Emphysema (ICD10-J43.9).        TTE January 18, 2020 IMPRESSIONS   1. Left ventricular ejection fraction, by estimation, is 55 to 60%. The  left ventricle has normal function. The left ventricle has no regional  wall motion abnormalities. Left ventricular diastolic parameters are  consistent with Grade I diastolic  dysfunction (impaired relaxation).   2. Right ventricular systolic function is normal. The right ventricular  size is normal.   3. The mitral valve is normal in structure and function. No evidence of  mitral valve regurgitation. No evidence of mitral stenosis.   4. The aortic valve is normal in structure and function. Aortic valve  regurgitation is not visualized. No aortic stenosis is present.   5. There is no obvious atheromatous plaque identified in the visible  portions of the ascending aorta or aortic arch.   6. The inferior vena cava is normal in size with greater than 50%  respiratory variability, suggesting right atrial pressure of 3 mmHg.   Comparison(s): No prior Echocardiogram.   FINDINGS   Left Ventricle: Left ventricular ejection fraction, by estimation, is 55  to 60%. The left ventricle has normal function. The left ventricle has no  regional wall motion abnormalities. The left ventricular internal cavity  size was normal in size. There is   no left ventricular hypertrophy. Left ventricular diastolic parameters  are consistent with Grade I diastolic dysfunction (impaired relaxation).  Normal left ventricular filling pressure.   Right Ventricle: The right ventricular size is normal. No increase in  right ventricular wall thickness. Right ventricular systolic function is  normal.   Left Atrium: Left atrial size was normal in size.   Right Atrium: Right atrial size was normal in size.   Pericardium: There is no evidence of pericardial effusion.   Mitral Valve: The mitral valve is normal in structure and function. Normal  mobility of  the mitral valve leaflets. No evidence of mitral valve  regurgitation. No evidence of mitral valve stenosis.   Tricuspid Valve: The tricuspid valve  is normal in structure. Tricuspid  valve regurgitation is not demonstrated. No evidence of tricuspid  stenosis.   Aortic Valve: The aortic valve is normal in structure and function. Aortic  valve regurgitation is not visualized. No aortic stenosis is present.   Pulmonic Valve: The pulmonic valve was normal in structure. Pulmonic valve  regurgitation is not visualized. No evidence of pulmonic stenosis.   Aorta: There is no obvious atheromatous plaque identified in the visible  portions of the ascending aorta or aortic arch. The aortic root is normal  in size and structure and the aortic root, ascending aorta and aortic arch  are all structurally normal,  with no evidence of dilitation or obstruction.   Venous: The inferior vena cava is normal in size with greater than 50%  respiratory variability, suggesting right atrial pressure of 3 mmHg.   IAS/Shunts: No atrial level shunt detected by color flow Doppler.     Recent Labs: No results found for requested labs within last 365 days.  Recent Lipid Panel    Component Value Date/Time   CHOL 242 (H) 09/17/2022 1101   TRIG 69 09/17/2022 1101   HDL 65 09/17/2022 1101   CHOLHDL 3.7 09/17/2022 1101   CHOLHDL 5.7 04/20/2015 1138   VLDL 34 04/20/2015 1138   LDLCALC 165 (H) 09/17/2022 1101   LDLDIRECT 246 (H) 10/31/2015 1444     Risk Assessment/Calculations:   {Does this patient have ATRIAL FIBRILLATION?[phone]}       Physical Exam:    VS:  There were no vitals taken for this visit.    Wt Readings from Last 3 Encounters:  10/31/22 141 lb 2 oz (64 kg)  10/17/22 141 lb (64 kg)  09/24/22 140 lb (63.5 kg)     GEN: *** Well nourished, well developed in no acute distress HEENT: Normal NECK: No JVD; No carotid bruits CARDIAC: ***RRR, no murmurs, rubs, gallops RESPIRATORY:  Clear  to auscultation without rales, wheezing or rhonchi  ABDOMEN: Soft, non-tender, non-distended MUSCULOSKELETAL:  No edema; No deformity. *** pedal pulses, ***bilaterally SKIN: Warm and dry NEUROLOGIC:  Alert and oriented x 3 PSYCHIATRIC:  Normal affect   EKG:  EKG is *** ordered today.  The ekg ordered today demonstrates ***  No BP recorded.  {Refresh Note OR Click here to enter BP  :1}***    Diagnoses:    No diagnosis found. Assessment and Plan:     Hypertension: Syncope History of CVA  {Are you ordering a CV Procedure (e.g. stress test, cath, DCCV, TEE, etc)?   Press F2        :[id number]}   Disposition:  Medication Adjustments/Labs and Tests Ordered: Current medicines are reviewed at length with the patient today.  Concerns regarding medicines are outlined above.  No orders of the defined types were placed in this encounter.  No orders of the defined types were placed in this encounter.   There are no Patient Instructions on file for this visit.   Signed, Emmaline Life, NP  11/08/2022 6:40 PM    [facility]

## 2022-11-10 ENCOUNTER — Encounter: Payer: Self-pay | Admitting: Family Medicine

## 2022-11-10 ENCOUNTER — Ambulatory Visit: Payer: Medicaid Other | Attending: Nurse Practitioner | Admitting: Nurse Practitioner

## 2022-11-10 ENCOUNTER — Ambulatory Visit: Payer: Medicaid Other | Admitting: Family Medicine

## 2022-11-10 ENCOUNTER — Encounter: Payer: Self-pay | Admitting: Nurse Practitioner

## 2022-11-10 VITALS — BP 146/73 | HR 81 | Ht 60.0 in | Wt 143.4 lb

## 2022-11-10 VITALS — BP 140/84 | HR 71 | Ht 60.0 in | Wt 141.0 lb

## 2022-11-10 DIAGNOSIS — I10 Essential (primary) hypertension: Secondary | ICD-10-CM

## 2022-11-10 DIAGNOSIS — I6523 Occlusion and stenosis of bilateral carotid arteries: Secondary | ICD-10-CM | POA: Diagnosis not present

## 2022-11-10 DIAGNOSIS — I447 Left bundle-branch block, unspecified: Secondary | ICD-10-CM | POA: Diagnosis not present

## 2022-11-10 DIAGNOSIS — R0609 Other forms of dyspnea: Secondary | ICD-10-CM

## 2022-11-10 DIAGNOSIS — R55 Syncope and collapse: Secondary | ICD-10-CM

## 2022-11-10 DIAGNOSIS — J449 Chronic obstructive pulmonary disease, unspecified: Secondary | ICD-10-CM

## 2022-11-10 DIAGNOSIS — E785 Hyperlipidemia, unspecified: Secondary | ICD-10-CM

## 2022-11-10 MED ORDER — METOPROLOL TARTRATE 100 MG PO TABS: 100.0000 mg | ORAL_TABLET | ORAL | 0 refills | Status: DC

## 2022-11-10 NOTE — Patient Instructions (Signed)
It was great to see you!  The good news is we haven't found any scary or dangerous causes of your passing out.  It's most likely "vasovagal" episodes, which are very common.  If you feel this coming on, lay down immediately, try to drink some water or juice and eat a snack. A cold cloth or ice on your face can help as well.  Stay well hydrated. Don't skip meals. Take a break from the slot machines every 15-30 minutes. Do not exceed 2 hours per day.  See me back in 2 months to follow up.  -Dr Rock Nephew  Syncope, Adult  Syncope refers to a condition in which a person temporarily loses consciousness. Syncope may also be called fainting or passing out. It is caused by a sudden decrease in blood flow to the brain. This can happen for a variety of reasons. Most causes of syncope are not dangerous. It can be triggered by things such as needle sticks, seeing blood, pain, or intense emotion. However, syncope can also be a sign of a serious medical problem, such as a heart abnormality. Other causes can include dehydration, migraines, or taking medicines that lower blood pressure. Your health care provider may do tests to find the reason why you are having syncope. If you faint, get medical help right away. Call your local emergency services (911 in the [place] Follow these instructions at home: Pay attention to any changes in your symptoms. Take these actions to stay safe and to help relieve your symptoms: Knowing when you may be about to faint Signs that you may be about to faint include: Feeling dizzy, weak, light-headed, or like the room is spinning. Feeling nauseous. Seeing spots or seeing all white or all black in your field of vision. Having cold, clammy skin or feeling warm and sweaty. Hearing ringing in the ears (tinnitus). If you start to feel like you might faint, sit or lie down right away. If sitting, put your head down between your legs. If lying down, raise (elevate) your feet above the  level of your heart. Breathe deeply and steadily. Wait until all the symptoms have passed. Have someone stay with you until you feel stable. Medicines Take over-the-counter and prescription medicines only as told by your health care provider. If you are taking blood pressure or heart medicine, get up slowly and take several minutes to sit and then stand. This can reduce dizziness and decrease the risk of syncope. Lifestyle Do not drive, use machinery, or play sports until your health care provider says it is okay. Do not drink alcohol. Do not use any products that contain nicotine or tobacco. These products include cigarettes, chewing tobacco, and vaping devices, such as e-cigarettes. If you need help quitting, ask your health care provider. Avoid hot tubs and saunas. General instructions Talk with your health care provider about your symptoms. You may need to have testing to understand the cause of your syncope. Drink enough fluid to keep your urine pale yellow. Avoid prolonged standing. If you must stand for a long time, do movements such as: Moving your legs. Crossing your legs. Flexing and stretching your leg muscles. Squatting. Keep all follow-up visits. This is important. Contact a health care provider if: You have episodes of near fainting. Get help right away if: You faint. You hit your head or are injured after fainting. You have any of these symptoms that may indicate trouble with your heart: Fast or irregular heartbeats (palpitations). Unusual pain in your chest, abdomen,  or back. Shortness of breath. You have a seizure. You have a severe headache. You are confused. You have vision problems. You have severe weakness or trouble walking. You are bleeding from your mouth or rectum, or you have black or tarry stool. These symptoms may represent a serious problem that is an emergency. Do not wait to see if your symptoms will go away. Get medical help right away. Call your  local emergency services (911 in the [place] Do not drive yourself to the hospital. Summary Syncope refers to a condition in which a person temporarily loses consciousness. Syncope may also be called fainting or passing out. It is caused by a sudden decrease in blood flow to the brain. Signs that you may be about to faint include dizziness, feeling light-headed, feeling nauseous, sudden vision changes, or cold, clammy skin. Even though most causes of syncope are not dangerous, syncope can be a sign of a serious medical problem. Get help right away if you faint. If you start to feel like you might faint, sit or lie down right away. If sitting, put your head down between your legs. If lying down, raise (elevate) your feet above the level of your heart. This information is not intended to replace advice given to you by your health care provider. Make sure you discuss any questions you have with your health care provider. Document Revised: 03/28/2021 Document Reviewed: 03/28/2021 Elsevier Patient Education  [place].

## 2022-11-10 NOTE — Progress Notes (Unsigned)
    SUBJECTIVE:   CHIEF COMPLAINT / HPI:   Hospital follow up Patient admitted to [facility] on 12/7 for syncopal episode. Had undergone R carotid endarterectomy on 11/27 with Dr. Radene Knee. Dr Radene Knee evaluated during most recent admission and felt her syncope was unrelated to the endarterectomy. Of note, she also had syncopal episode on 09/07/22. Both episodes occurred while playing slot machines (which she does for few hours per day) and both were preceded by feeling very hot.  Has had fairly extensive workup for cause of her syncope (including echo, zio patch, stress test, carotid studies, EEG, and lab work). Results largely unrevealing other than carotid stenosis which was treated as above and zio patch showing frequent but very brief episodes of PSVT.  She saw cardiology today who feels the SVT is unlikely cause of her syncope. They do have plans to get coronary CTA.  Patient otherwise feels well. She's just worried about having another episode and wants to know the exact cause.  PERTINENT  PMH / PSH: HTN, HLD, COPD, migraine  OBJECTIVE:   BP (!) 146/73   Pulse 81   Ht 5' (1.524 m)   Wt 143 lb 6.4 oz (65 kg)   SpO2 100%   BMI 28.01 kg/m   Gen: NAD, pleasant, able to participate in exam Neck: large carotid endarterectomy scar on R lateral neck, healing appropriately without surrounding erythema or drainage CV: RRR, normal S1/S2, no murmur Resp: Normal effort, lungs CTAB Extremities: no edema or cyanosis Skin: warm and dry, no rashes noted Neuro: alert, oriented x4, normal speech, normal gait, 5/5 strength in all extremities, sensation intact to light touch throughout Psych: Normal affect and mood  ASSESSMENT/PLAN:   Syncope 2 episodes of syncope in the past 2 months (10/8 and 12/7), both occurred while playing slot machines. Extensive workup at [facility] without exact cause. Favor vasovagal episodes at this point. Also following with cards & has upcoming coronary CTA.  Discussed strategies to potentially stave off syncopal episode when she gets presyncopal (lay down, eat/drink, cold cloth, etc). Also advised taking frequent breaks from slot machines and staying well hydrated/proper nutrition. Follow up in 8 weeks.     Alcus Dad, MD [place]

## 2022-11-10 NOTE — Patient Instructions (Signed)
Medication Instructions:   Your physician recommends that you continue on your current medications as directed. Please refer to the Current Medication list given to you today.   *If you need a refill on your cardiac medications before your next appointment, please call your pharmacy*   Lab Work:  None ordered.  If you have labs (blood work) drawn today and your tests are completely normal, you will receive your results only by: [place] (if you have MyChart) OR A paper copy in the mail If you have any lab test that is abnormal or we need to change your treatment, we will call you to review the results.   Testing/Procedures:    Your cardiac CT will be scheduled at one of the below locations:   [facility] [address] [place], [place] [address] [phone]  If scheduled at [facility], please arrive at the [facility] and Children's Entrance (Entrance C2) of [facility] 30 minutes prior to test start time. You can use the FREE valet parking offered at entrance C (encouraged to control the heart rate for the test)  Proceed to the [facility] Radiology Department (first floor) to check-in and test prep.  All radiology patients and guests should use entrance C2 at [facility], accessed from [facility], even though the hospital's physical address listed is [address].    Please follow these instructions carefully (unless otherwise directed):  On the Night Before the Test: Be sure to Drink plenty of water. Do not consume any caffeinated/decaffeinated beverages or chocolate 12 hours prior to your test. Do not take any antihistamines 12 hours prior to your test.  On the Day of the Test: Drink plenty of water until 1 hour prior to the test. Do not eat any food 1 hour prior to test. You may take your regular medications prior to the test.  Take metoprolol (Lopressor) (100 mg) two hours prior to test. FEMALES-  please wear underwire-free bra if available, avoid dresses & tight clothing      After the Test: Drink plenty of water. After receiving IV contrast, you may experience a mild flushed feeling. This is normal. On occasion, you may experience a mild rash up to 24 hours after the test. This is not dangerous. If this occurs, you can take Benadryl 25 mg and increase your fluid intake. If you experience trouble breathing, this can be serious. If it is severe call 911 IMMEDIATELY. If it is mild, please call our office. If you take any of these medications: Glipizide/Metformin, Avandament, Glucavance, please do not take 48 hours after completing test unless otherwise instructed.  We will call to schedule your test 2-4 weeks out understanding that some insurance companies will need an authorization prior to the service being performed.   For non-scheduling related questions, please contact the cardiac imaging nurse navigator should you have any questions/concerns: Marchia Bond, Cardiac Imaging Nurse Navigator Gordy Clement, Cardiac Imaging Nurse Navigator [facility] Direct Office Dial: [phone]   For scheduling needs, including cancellations and rescheduling, please call [place], [phone].    Follow-Up: At [facility], you and your health needs are our priority.  As part of our continuing mission to provide you with exceptional heart care, we have created designated Provider Care Teams.  These Care Teams include your primary Cardiologist (physician) and Advanced Practice Providers (APPs -  Physician Assistants and Nurse Practitioners) who all work together to provide you with  the care you need, when you need it.  We recommend signing up for the patient portal called "MyChart".  Sign up information is provided on this After Visit Summary.  MyChart is used to connect with patients for Virtual Visits (Telemedicine).  Patients are able to view lab/test  results, encounter notes, upcoming appointments, etc.  Non-urgent messages can be sent to your provider as well.   To learn more about what you can do with MyChart, go to [email or web address].    Your next appointment:   1 month(s)  The format for your next appointment:   In Person  Provider:   Berniece Salines, DO     Other Instructions  DASH Eating Plan DASH stands for Dietary Approaches to Stop Hypertension. The DASH eating plan is a healthy eating plan that has been shown to: Reduce high blood pressure (hypertension). Reduce your risk for type 2 diabetes, heart disease, and stroke. Help with weight loss. What are tips for following this plan? Reading food labels Check food labels for the amount of salt (sodium) per serving. Choose foods with less than 5 percent of the Daily Value of sodium. Generally, foods with less than 300 milligrams (mg) of sodium per serving fit into this eating plan. To find whole grains, look for the word "whole" as the first word in the ingredient list. Shopping Buy products labeled as "low-sodium" or "no salt added." Buy fresh foods. Avoid canned foods and pre-made or frozen meals. Cooking Avoid adding salt when cooking. Use salt-free seasonings or herbs instead of table salt or sea salt. Check with your health care provider or pharmacist before using salt substitutes. Do not fry foods. Cook foods using healthy methods such as baking, boiling, grilling, roasting, and broiling instead. Cook with heart-healthy oils, such as olive, canola, avocado, soybean, or sunflower oil. Meal planning  Eat a balanced diet that includes: 4 or more servings of fruits and 4 or more servings of vegetables each day. Try to fill one-half of your plate with fruits and vegetables. 6-8 servings of whole grains each day. Less than 6 oz (170 g) of lean meat, poultry, or fish each day. A 3-oz (85-g) serving of meat is about the same size as a deck of cards. One egg equals 1 oz (28  g). 2-3 servings of low-fat dairy each day. One serving is 1 cup (237 mL). 1 serving of nuts, seeds, or beans 5 times each week. 2-3 servings of heart-healthy fats. Healthy fats called omega-3 fatty acids are found in foods such as walnuts, flaxseeds, fortified milks, and eggs. These fats are also found in cold-water fish, such as sardines, salmon, and mackerel. Limit how much you eat of: Canned or prepackaged foods. Food that is high in trans fat, such as some fried foods. Food that is high in saturated fat, such as fatty meat. Desserts and other sweets, sugary drinks, and other foods with added sugar. Full-fat dairy products. Do not salt foods before eating. Do not eat more than 4 egg yolks a week. Try to eat at least 2 vegetarian meals a week. Eat more home-cooked food and less restaurant, buffet, and fast food. Lifestyle When eating at a restaurant, ask that your food be prepared with less salt or no salt, if possible. If you drink alcohol: Limit how much you use to: 0-1 drink a day for women who are not pregnant. 0-2 drinks a day for men. Be aware of how much alcohol is in your drink. In the [place] one drink  equals one 12 oz bottle of beer (355 mL), one 5 oz glass of wine (148 mL), or one 1 oz glass of hard liquor (44 mL). General information Avoid eating more than 2,300 mg of salt a day. If you have hypertension, you may need to reduce your sodium intake to 1,500 mg a day. Work with your health care provider to maintain a healthy body weight or to lose weight. Ask what an ideal weight is for you. Get at least 30 minutes of exercise that causes your heart to beat faster (aerobic exercise) most days of the week. Activities may include walking, swimming, or biking. Work with your health care provider or dietitian to adjust your eating plan to your individual calorie needs. What foods should I eat? Fruits All fresh, dried, or frozen fruit. Canned fruit in natural juice (without added  sugar). Vegetables Fresh or frozen vegetables (raw, steamed, roasted, or grilled). Low-sodium or reduced-sodium tomato and vegetable juice. Low-sodium or reduced-sodium tomato sauce and tomato paste. Low-sodium or reduced-sodium canned vegetables. Grains Whole-grain or whole-wheat bread. Whole-grain or whole-wheat pasta. Brown rice. [place]. Bulgur. Whole-grain and low-sodium cereals. Pita bread. Low-fat, low-sodium crackers. Whole-wheat flour tortillas. Meats and other proteins Skinless chicken or [place]. Ground chicken or [place]. Pork with fat trimmed off. Fish and seafood. Egg whites. Dried beans, peas, or lentils. Unsalted nuts, nut butters, and seeds. Unsalted canned beans. Lean cuts of beef with fat trimmed off. Low-sodium, lean precooked or cured meat, such as sausages or meat loaves. Dairy Low-fat (1%) or fat-free (skim) milk. Reduced-fat, low-fat, or fat-free cheeses. Nonfat, low-sodium ricotta or cottage cheese. Low-fat or nonfat yogurt. Low-fat, low-sodium cheese. Fats and oils Soft margarine without trans fats. Vegetable oil. Reduced-fat, low-fat, or light mayonnaise and salad dressings (reduced-sodium). Canola, safflower, olive, avocado, soybean, and sunflower oils. Avocado. Seasonings and condiments Herbs. Spices. Seasoning mixes without salt. Other foods Unsalted popcorn and pretzels. Fat-free sweets. The items listed above may not be a complete list of foods and beverages you can eat. Contact a dietitian for more information. What foods should I avoid? Fruits Canned fruit in a light or heavy syrup. Fried fruit. Fruit in cream or butter sauce. Vegetables Creamed or fried vegetables. Vegetables in a cheese sauce. Regular canned vegetables (not low-sodium or reduced-sodium). Regular canned tomato sauce and paste (not low-sodium or reduced-sodium). Regular tomato and vegetable juice (not low-sodium or reduced-sodium). Angie Fava. Olives. Grains Baked goods made with fat, such  as croissants, muffins, or some breads. Dry pasta or rice meal packs. Meats and other proteins Fatty cuts of meat. Ribs. Fried meat. Berniece Salines. Bologna, salami, and other precooked or cured meats, such as sausages or meat loaves. Fat from the back of a pig (fatback). Bratwurst. Salted nuts and seeds. Canned beans with added salt. Canned or smoked fish. Whole eggs or egg yolks. Chicken or [place] with skin. Dairy Whole or 2% milk, cream, and half-and-half. Whole or full-fat cream cheese. Whole-fat or sweetened yogurt. Full-fat cheese. Nondairy creamers. Whipped toppings. Processed cheese and cheese spreads. Fats and oils Butter. Stick margarine. Lard. Shortening. Ghee. Bacon fat. Tropical oils, such as coconut, palm kernel, or palm oil. Seasonings and condiments Onion salt, garlic salt, seasoned salt, table salt, and sea salt. Worcestershire sauce. Tartar sauce. Barbecue sauce. Teriyaki sauce. Soy sauce, including reduced-sodium. Steak sauce. Canned and packaged gravies. Fish sauce. Oyster sauce. Cocktail sauce. Store-bought horseradish. Ketchup. Mustard. Meat flavorings and tenderizers. Bouillon cubes. Hot sauces. Pre-made or packaged marinades. Pre-made or packaged taco seasonings. Relishes. Regular  salad dressings. Other foods Salted popcorn and pretzels. The items listed above may not be a complete list of foods and beverages you should avoid. Contact a dietitian for more information. Where to find more information National Heart, Lung, and Blood Institute: https://wilson-eaton.com/ American Heart Association: www.heart.org Academy of Nutrition and Dietetics: www.eatright.[place]: www.kidney.org Summary The DASH eating plan is a healthy eating plan that has been shown to reduce high blood pressure (hypertension). It may also reduce your risk for type 2 diabetes, heart disease, and stroke. When on the DASH eating plan, aim to eat more fresh fruits and vegetables, whole grains, lean  proteins, low-fat dairy, and heart-healthy fats. With the DASH eating plan, you should limit salt (sodium) intake to 2,300 mg a day. If you have hypertension, you may need to reduce your sodium intake to 1,500 mg a day. Work with your health care provider or dietitian to adjust your eating plan to your individual calorie needs. This information is not intended to replace advice given to you by your health care provider. Make sure you discuss any questions you have with your health care provider. Document Revised: 10/21/2019 Document Reviewed: 10/21/2019 Elsevier Patient Education  [place] TO TAKE YOUR BLOOD PRESSURE  Rest 5 minutes before taking your blood pressure. Don't  smoke or drink caffeinated beverages for at least 30 minutes before. Take your blood pressure before (not after) you eat. Sit comfortably with your back supported and both feet on the floor ( don't cross your legs). Elevate your arm to heart level on a table or a desk. Use the proper sized cuff.  It should fit smoothly and snugly around your bare upper arm.  There should be  Enough room to slip a fingertip under the cuff.  The bottom edge of the cuff should be 1 inch above the crease Of the elbow. Please monitor your blood pressure once daily 2 hours after your am medication. If you blood pressure Consistently remains above 163 (systolic) top number or over 80 ( diastolic) bottom number X 3 days  Consecutively.  Please call our office at [phone] or send Mychart message.     Important Information About Sugar

## 2022-11-12 DIAGNOSIS — R55 Syncope and collapse: Secondary | ICD-10-CM | POA: Insufficient documentation

## 2022-11-12 NOTE — Assessment & Plan Note (Addendum)
2 episodes of syncope in the past 2 months (10/8 and 12/7), both occurred while playing slot machines. Extensive workup at [facility] without exact cause. Favor vasovagal episodes at this point. Also following with cards & has upcoming coronary CTA. Discussed strategies to potentially stave off syncopal episode when she gets presyncopal (lay down, eat/drink, cold cloth, etc). Also advised taking frequent breaks from slot machines and staying well hydrated/proper nutrition. Follow up in 8 weeks.

## 2022-11-17 ENCOUNTER — Telehealth (HOSPITAL_COMMUNITY): Payer: Self-pay | Admitting: Emergency Medicine

## 2022-11-17 NOTE — Telephone Encounter (Signed)
Reaching out to patient to offer assistance regarding upcoming cardiac imaging study; pt verbalizes understanding of appt date/time, parking situation and where to check in, pre-test NPO status and medications ordered, and verified current allergies; name and call back number provided for further questions should they arise Diana Bond RN Navigator Cardiac Imaging [facility] [phone] office [phone] cell  Arrival 1130 '100mg'$  metoprolol tartrate Denies iv issues Aware nitro/contrast Holding excedrine migraine meds

## 2022-11-18 ENCOUNTER — Other Ambulatory Visit: Payer: Self-pay

## 2022-11-18 DIAGNOSIS — F419 Anxiety disorder, unspecified: Secondary | ICD-10-CM

## 2022-11-18 DIAGNOSIS — G43519 Persistent migraine aura without cerebral infarction, intractable, without status migrainosus: Secondary | ICD-10-CM

## 2022-11-18 MED ORDER — SUMATRIPTAN SUCCINATE 50 MG PO TABS: 50.0000 mg | ORAL_TABLET | ORAL | 3 refills | Status: DC | PRN

## 2022-11-18 MED ORDER — VENLAFAXINE HCL ER 37.5 MG PO CP24: 37.5000 mg | ORAL_CAPSULE | Freq: Every day | ORAL | 3 refills | Status: DC

## 2022-11-19 ENCOUNTER — Ambulatory Visit (HOSPITAL_COMMUNITY)
Admission: RE | Admit: 2022-11-19 | Discharge: 2022-11-19 | Disposition: A | Discharge status: Home / Self Care | Payer: Medicaid Other | Source: Ambulatory Visit | Attending: Nurse Practitioner | Admitting: Nurse Practitioner

## 2022-11-19 DIAGNOSIS — R55 Syncope and collapse: Secondary | ICD-10-CM | POA: Insufficient documentation

## 2022-11-19 DIAGNOSIS — I2510 Atherosclerotic heart disease of native coronary artery without angina pectoris: Secondary | ICD-10-CM

## 2022-11-19 DIAGNOSIS — J449 Chronic obstructive pulmonary disease, unspecified: Secondary | ICD-10-CM | POA: Insufficient documentation

## 2022-11-19 DIAGNOSIS — I10 Essential (primary) hypertension: Secondary | ICD-10-CM | POA: Diagnosis present

## 2022-11-19 MED ORDER — NITROGLYCERIN 0.4 MG SL SUBL
SUBLINGUAL_TABLET | SUBLINGUAL | Status: AC
  Filled 2022-11-19: qty 2

## 2022-11-19 MED ORDER — IOHEXOL 350 MG/ML SOLN
100.0000 mL | Freq: Once | INTRAVENOUS | Status: AC | PRN
  Administered 2022-11-19: 100 mL via INTRAVENOUS

## 2022-11-19 MED ORDER — NITROGLYCERIN 0.4 MG SL SUBL
0.8000 mg | SUBLINGUAL_TABLET | Freq: Once | SUBLINGUAL | Status: AC
  Administered 2022-11-19: 0.8 mg via SUBLINGUAL

## 2022-11-26 ENCOUNTER — Encounter: Payer: Self-pay | Admitting: Cardiology

## 2022-11-27 ENCOUNTER — Other Ambulatory Visit: Payer: Medicaid Other

## 2022-12-05 ENCOUNTER — Other Ambulatory Visit: Payer: Medicaid Other

## 2022-12-08 ENCOUNTER — Other Ambulatory Visit: Payer: Self-pay

## 2022-12-09 MED ORDER — ESOMEPRAZOLE MAGNESIUM 40 MG PO CPDR: DELAYED_RELEASE_CAPSULE | ORAL | 1 refills | Status: DC

## 2022-12-10 ENCOUNTER — Encounter: Payer: Self-pay | Admitting: Obstetrics and Gynecology

## 2022-12-10 ENCOUNTER — Other Ambulatory Visit: Payer: Medicaid Other | Admitting: Obstetrics and Gynecology

## 2022-12-10 NOTE — Patient Outreach (Signed)
Medicaid Managed Care   Nurse Care Manager Note  12/10/2022 Name:  Diana [name] MRN:  [id number] DOB:  Dec 08, 1955  Diana [name] is an 67 y.o. year old female who is a primary patient of Diana Dad, MD.  The Medicaid Managed Care Coordination team was consulted for assistance with:    Chronic healthcare management needs, asthma, CAD, CKD, COPD, HLD, syncope, chronic pain, HTN, RLS, migraines, IBS, vaping.  Diana [name] was given information about Medicaid Managed Care Coordination team services today. Diana [name] Patient agreed to services and verbal consent obtained.  Engaged with patient by telephone for follow up visit in response to provider referral for case management and/or care coordination services.   Assessments/Interventions:  Review of past medical history, allergies, medications, health status, including review of consultants reports, laboratory and other test data, was performed as part of comprehensive evaluation and provision of chronic care management services.  SDOH (Social Determinants of Health) assessments and interventions performed: SDOH Interventions    Flowsheet Row Patient Outreach Telephone from 12/10/2022 in [facility]  SDOH Interventions   Transportation Interventions Intervention Not Indicated     Care Plan  Allergies  Allergen Reactions   Penicillins Anaphylaxis   Demerol [Meperidine] Nausea And Vomiting   Doxycycline Hyclate Nausea And Vomiting   Gabapentin Other (See Comments)    Malaise   Sulfa Antibiotics Hives   Atorvastatin     myalgias   Fluvastatin     myalgia   Levaquin [Levofloxacin]     Reported some possible facial swelling with administration 05/2018   Lovastatin     myalgia   No Known Allergies     Other reaction(s): Dizziness   Pitavastatin     myalgia   Pravastatin     myalgia   Rosuvastatin     myalgia   Simvastatin     myalgia   Benadryl [Diphenhydramine Hcl] Palpitations and Rash    Buprenorphine Hcl Nausea And Vomiting   Cephalexin Rash   Morphine And Related Nausea And Vomiting   Spiriva Handihaler [Tiotropium Bromide Monohydrate] Rash   Medications Reviewed Today     Reviewed by [name], RN (Registered Nurse) on 12/10/22 at 1109  Med List Status: <None>   Medication Order Taking? Sig Documenting Provider Last Dose Status Informant  acetaminophen-codeine (TYLENOL #3) 300-30 MG tablet [id number] No Take 1 tablet by mouth 2 (two) times daily. [provider] Taking Active   Alirocumab (PRALUENT) 150 MG/ML SOAJ [id number] No Inject 150 mg into the skin every 14 (fourteen) days. Berniece Salines, DO Taking Active   allopurinol (ZYLOPRIM) 100 MG tablet [id number] No Take 1 tablet (100 mg total) by mouth daily. Diana Dad, MD Taking Active   AMITIZA 24 MCG capsule [id number] No Take 24 mcg by mouth daily. [provider] Taking Active   aspirin EC 325 MG tablet [id number] No Take 325 mg by mouth daily. [provider] Taking Active   aspirin-acetaminophen-caffeine (EXCEDRIN MIGRAINE) [id number] MG tablet [id number] No Take 2 tablets by mouth every 6 (six) hours as needed for headache. [provider] Taking Active   azelastine (ASTELIN) 0.1 % nasal spray [id number] No Place into the nose in the morning and at bedtime. [provider] Taking Active   clopidogrel (PLAVIX) 75 MG tablet [id number] No Take 75 mg by mouth daily. [provider] Taking Active   co-enzyme Q-10 30 MG capsule [id number] No Take 100 mg by mouth daily. [provider] Taking Active  Cyanocobalamin (B-12) 3000 MCG CAPS [id number] No Take 1 capsule by mouth daily. [provider] Taking Active   diclofenac sodium (VOLTAREN) 1 % GEL [id number] No Apply 2 g topically 4 (four) times daily as needed. Glenis Smoker, MD Taking Active   esomeprazole (NEXIUM) 40 MG capsule [id number]  TAKE 1 CAPSULE BY MOUTH DAILY AT 12 Novella Olive,  Hollie Beach, MD  Active   famotidine (PEPCID) 20 MG tablet [id number] No One after supper Tanda Rockers, MD Taking Active   lidocaine (HM LIDOCAINE PATCH) 4 % [id number] No Place 1 patch onto the skin daily. Diana Dad, MD Taking Active   linaclotide [facility]) 145 MCG CAPS capsule [id number] No TAKE 1 CAPSULE BY MOUTH DAILY BEFORE Sherlyn Hay, MD Taking Active   naloxone [facility]) nasal spray 4 mg/0.1 mL [id number] No Place into the nose as needed. overdose [provider] Taking Active   olmesartan (BENICAR) 20 MG tablet [id number] No Take 1 tablet (20 mg total) by mouth daily. Donney Dice, DO Taking Active   Pitavastatin Calcium 1 MG TABS [id number] No Take 1 tablet (1 mg total) by mouth 2 (two) times a week. [name], Godfrey Pick, DO Taking Active   SUMAtriptan (IMITREX) 50 MG tablet [id number]  Take 1 tablet (50 mg total) by mouth every 2 (two) hours as needed for migraine. May repeat in 2 hours if headache persists or recurs. Diana Dad, MD  Active   Turmeric 500 MG CAPS [id number] No Take 2 tablets by mouth daily. [provider] Taking Active   venlafaxine XR (EFFEXOR-XR) 37.5 MG 24 hr capsule [id number]  Take 1 capsule (37.5 mg total) by mouth daily with breakfast. 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 12/26/2021   Encounter for hearing screening with abnormal findings 06/14/2021   Rotator cuff tear, non-traumatic, right 05/21/2021   Lung nodule 09/19/2020   Dry eye syndrome of both eyes 01/15/2020   Memory change 02/07/2019   Tobacco abuse 12/08/2017   Chronic obstructive pulmonary disease ([place]) 03/04/2017   Postmenopausal bone loss 05/29/2014   Migraine aura, persistent, intractable 05/24/2014   CKD  (chronic kidney disease), 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   Atrophic vaginitis 08/28/2012   Irritable bowel syndrome (IBS) 06/07/2012   Restless leg syndrome 05/28/2012   Anxiety 05/28/2012   Hypertension, essential, benign 05/28/2012   Hyperlipidemia 05/28/2012   Barrett's esophagus 05/20/2012   Conditions to be addressed/monitored per PCP order:  Chronic healthcare management needs, asthma, CAD, CKD, COPD, HLD, syncope, chronic pain, HTN, RLS, migraines, IBS, vaping.  Care Plan : RN Care Manager Plan of Care  Updates made by [name], RN since 12/10/2022 12:00 AM     Problem: Health Promotion or Disease Self-Management (General Plan of Care)      Goal: Establish Plan of Care for Chronic Disease Management Needs   Priority: High  Note:   Timeframe:  Long-Range Goal Priority:  High Start Date:      12/10/22                       Expected End Date:    ongoing  Follow Up Date 01/06/23    - schedule appointment for flu shot - schedule appointment for vaccines needed due to my age or health - schedule recommended health tests (blood work, mammogram, colonoscopy, pap test) - schedule and keep appointment for annual check-up    Why is this important?   Screening tests can find diseases early when they are easier to treat.  Your doctor or nurse will talk with you about which tests are important for you.  Getting shots for common diseases like the flu and shingles will help prevent them.     Long-Range Goal: Chronic Disease Management   Start Date: 12/10/2022  Expected End Date: 03/11/2023  Priority: High  Note:   .Current Barriers:  Knowledge Deficits related to plan of care for management of CAD, HTN, HLD, COPD, CKD Stage 2, Gout, Asthma, and syncope, chronic pain, RLS, migraines, IBS, vaping  Chronic Disease Management support and education needs related to CAD, HTN, HLD, COPD, CKD Stage 2, Gout, Asthma, and syncope, chronic pain, RLS, migraines, IBS, vaping 12/10/22:   Patient states she has had productive cough since Christmas with green sputum-encouraged patient to contact her provider.  Also, patient stopped taking her BP medication on her own on 12/02/22 after her sister told her she should not take it unless her systolic reading was greater than 150.  Patient's BP readings 129-142/77-84, suggested patient call her provider to discuss rather than stopping her medication on her own.  Patient does have h/o migraines and has had headaches every 3 days since stopping BP medication.  Chronic pain is controlled with medications, heat, cold.  Patient goes to [facility] in [place] and feels like pain is managed. Pain at its  worst on scale 1-10 is a 9 and is able to get relief to a 4 or 5.  Currently no syncope episodes-CT negative and CARDS appt WNL.  Patient currently vapes every day.  RNCM Clinical Goal(s):  Patient will verbalize understanding of plan for management of CAD, HTN, HLD, COPD, CKD Stage 2, gout, Asthma, and syncope, chronic pain, RLS, migraines, IBS,vaping as evidenced by patient report and taking all medications verbalize basic understanding of  CAD, HTN, HLD, COPD, CKD Stage 2, Gout, Asthma, and syncope, chronic pain, RLS, migraines,IBS,  vaping  disease process and self health management plan as evidenced by patient report and taking all medications take all medications exactly as prescribed and will call provider for medication related questions as evidenced by patient report demonstrate understanding of rationale for each prescribed medication as evidenced by patient report attend all scheduled medical appointments as evidenced by patient report demonstrate Improved adherence to prescribed treatment plan for CAD, HTN, HLD, COPD, CKD Stage 2, Gout, Asthma, and syncope, chronic pain, RLS, migraines, IBS, vaping  as evidenced by patient report and taking all medications continue to work with RN Care Manager to address care management and care  coordination needs related to  CAD, HTN, HLD, COPD, CKD Stage 2, Gout, and Asthma, syncope, chronic pain, RLS, migraines, IBS, vaping as evidenced by adherence to CM Team Scheduled appointments through collaboration with RN Care manager, provider, and care team.   Interventions: Inter-disciplinary care team collaboration (see longitudinal plan of care) Evaluation of current treatment plan related to  self management and patient's adherence to plan as established by provider  Asthma: (Status:New goal.) Long Term Goal Provided education about and advised patient to utilize infection prevention strategies to reduce risk of respiratory infection Discussed the importance of adequate rest and  management of fatigue with Asthma Assessed social determinant of health barriers   CAD Interventions: (Status:  New goal.) Long Term Goal Assessed understanding of CAD diagnosis Medications reviewed including medications utilized in CAD treatment plan Reviewed Importance of taking all medications as prescribed Reviewed Importance of attending all scheduled provider appointments Advised patient to discuss medications with provider Assessed social determinant of health barriers   Chronic Kidney Disease Interventions:  (Status:  New goal.) Long Term Goal Evaluation of current treatment plan related to chronic kidney disease self management and patient's adherence to plan as established by provider      Reviewed medications with patient and discussed importance of compliance    Advised patient, providing education and rationale, to monitor blood pressure daily and record, calling PCP for findings outside established parameters    Reviewed scheduled/upcoming provider appointments including    Discussed plans with patient for ongoing care management follow up and provided patient with direct contact information for care management team    Assessed social determinant of health barriers    Last practice recorded BP  readings:  BP Readings from Last 3 Encounters:  11/19/22 98/60  11/10/22 (!) 146/73  11/10/22 (!) 140/84  Most recent eGFR/CrCl: No results found for: "EGFR"  No components found for: "CRCL"  COPD Interventions:  (Status:  New goal.) Long Term Goal Provided education about and advised patient to utilize infection prevention strategies to reduce risk of respiratory infection Discussed the importance of adequate rest and management of fatigue with COPD Assessed social determinant of health barriers  Hyperlipidemia Interventions:  (Status:  New goal.) Long Term Goal Medication review performed; medication list updated in electronic medical record.  Assessed social determinant of health barriers   Hypertension Interventions:  (Status:  New goal.) Long Term Goal Last practice recorded BP readings:  BP Readings from Last 3 Encounters:  11/19/22 98/60  11/10/22 (!) 146/73  11/10/22 (!) 140/84  Most recent eGFR/CrCl: No results found for: "EGFR"  No components found for: "CRCL"  Evaluation of current treatment plan related to hypertension self management and patient's adherence to plan as established by provider Reviewed medications with patient and discussed importance of compliance Discussed plans with patient for ongoing care management follow up and provided patient with direct contact information for care management team Advised patient, providing education and rationale, to monitor blood pressure daily and record, calling PCP for findings outside established parameters Reviewed scheduled/upcoming provider appointments including:  Advised patient to discuss medication  with provider Assessed social determinant of health barriers  Pain Interventions:  (Status:  New goal.) Long Term Goal Pain assessment performed Medications reviewed Reviewed provider established plan for pain management Discussed importance of adherence to all scheduled medical appointments Counseled on the  importance of reporting any/all new or changed pain symptoms or management strategies to pain management provider Advised patient to report to care team affect of pain on daily activities Discussed use of relaxation techniques and/or diversional activities to assist with pain reduction (distraction, imagery, relaxation, massage, acupressure, TENS, heat, and cold application Reviewed with patient prescribed pharmacological and nonpharmacological pain relief strategies Assessed social determinant of health barriers  Patient Goals/Self-Care Activities: Take all medications as prescribed Attend all scheduled provider appointments Call pharmacy for medication refills 3-7 days in advance of running out of medications Perform all self care activities independently  Perform IADL's (shopping, preparing meals, housekeeping, managing finances) independently Call provider office for new concerns or questions   Follow Up Plan:  The patient has been  provided with contact information for the care management team and has been advised to call with any health related questions or concerns.  The care management team will reach out to the patient again over the next 30 business  days.    Follow Up:  Patient agrees to Care Plan and Follow-up.  Plan: The Managed Medicaid care management team will reach out to the patient again over the next 30 business  days. and The  Patient has been provided with contact information for the Managed Medicaid care management team and has been advised to call with any health related questions or concerns.  Date/time of next scheduled RN care management/care coordination outreach:  01/06/23 at 1230.

## 2022-12-10 NOTE — Patient Instructions (Signed)
Hi Ms. Lanza, thank you for speaking with me today, have a nice afternoon!  Ms. Suleiman was given information about Medicaid Managed Care team care coordination services as a part of their Healthy [facility] Medicaid benefit. Dorthy Cooler verbally consented to engagement with the [facility] Managed Care team.   If you are experiencing a medical emergency, please call 911 or report to your local emergency department or urgent care.   If you have a non-emergency medical problem during routine business hours, please contact your provider's office and ask to speak with a nurse.   For questions related to your Healthy [facility] health plan, please call: [phone] or visit the homepage here: [email or web address]  If you would like to schedule transportation through your Healthy [facility] plan, please call the following number at least 2 days in advance of your appointment: [phone]  For information about your ride after you set it up, call Ride Assist at [phone]. Use this number to activate a Will Call pickup, or if your transportation is late for a scheduled pickup. Use this number, too, if you need to make a change or cancel a previously scheduled reservation.  If you need transportation services right away, call [phone]. The after-hours call center is staffed 24 hours to handle ride assistance and urgent reservation requests (including discharges) 365 days a year. Urgent trips include sick visits, hospital discharge requests and life-sustaining treatment.  Call the [place] at [phone], at any time, 24 hours a day, 7 days a week. If you are in danger or need immediate medical attention call 911.  If you would like help to quit smoking, call 1-800-QUIT-NOW 519-588-5321) OR Espaol: 1-855-Djelo-Ya (4-970-263-7858) o para ms informacin haga clic aqu or Text READY to 200-400 to register via text  Ms. Cherney - following are the  goals we discussed in your visit today:   Goals Addressed    Timeframe:  Long-Range Goal Priority:  High Start Date:      12/10/22                       Expected End Date:    ongoing                  Follow Up Date 01/06/23    - schedule appointment for flu shot - schedule appointment for vaccines needed due to my age or health - schedule recommended health tests (blood work, mammogram, colonoscopy, pap test) - schedule and keep appointment for annual check-up    Why is this important?   Screening tests can find diseases early when they are easier to treat.  Your doctor or nurse will talk with you about which tests are important for you.  Getting shots for common diseases like the flu and shingles will help prevent them.   Patient verbalizes understanding of instructions and care plan provided today and agrees to view in [place]. Active MyChart status and patient understanding of how to access instructions and care plan via MyChart confirmed with patient.     The Managed Medicaid care management team will reach out to the patient again over the next 30 business  days.  The  Patient  has been provided with contact information for the Managed Medicaid care management team and has been advised to call with any health related questions or concerns.   [name], [name] [place]  [facility] - Managed Medicaid High Risk [phone]  Following is a copy of your plan of care:  Care Plan : [facility] of Care  Updates made by [name], RN since 12/10/2022 12:00 AM     Problem: Health Promotion or Disease Self-Management (General Plan of Care)      Goal: Establish Plan of Care for Chronic Disease Management Needs   Priority: High  Note:   Timeframe:  Long-Range Goal Priority:  High Start Date:      12/10/22                       Expected End Date:    ongoing                  Follow Up Date 01/06/23    - schedule  appointment for flu shot - schedule appointment for vaccines needed due to my age or health - schedule recommended health tests (blood work, mammogram, colonoscopy, pap test) - schedule and keep appointment for annual check-up    Why is this important?   Screening tests can find diseases early when they are easier to treat.  Your doctor or nurse will talk with you about which tests are important for you.  Getting shots for common diseases like the flu and shingles will help prevent them.    Long-Range Goal: Chronic Disease Management   Start Date: 12/10/2022  Expected End Date: 03/11/2023  Priority: High  Note:   .Current Barriers:  Knowledge Deficits related to plan of care for management of CAD, HTN, HLD, COPD, CKD Stage 2, Gout, Asthma, and syncope, chronic pain, RLS, migraines, IBS, vaping  Chronic Disease Management support and education needs related to CAD, HTN, HLD, COPD, CKD Stage 2, Gout, Asthma, and syncope, chronic pain, RLS, migraines, IBS, vaping 12/10/22:  Patient states she has had productive cough since Christmas with green sputum-encouraged patient to contact her provider.  Also, patient stopped taking her BP medication on her own on 12/02/22 after her sister told her she should not take it unless her systolic reading was greater than 150.  Patient's BP readings 129-142/77-84, suggested patient call her provider to discuss rather than stopping her medication on her own.  Patient does have h/o migraines and has had headaches every 3 days since stopping BP medication.  Chronic pain is controlled with medications, heat, cold.  Patient goes to [facility] in [place] and feels like pain is managed. Pain at its  worst on scale 1-10 is a 9 and is able to get relief to a 4 or 5.  Currently no syncope episodes-CT negative and CARDS appt WNL.  Patient currently vapes every day.  Complains of RLS every night and hard to sleep-to f/u with provider.  RNCM Clinical Goal(s):  Patient will  verbalize understanding of plan for management of CAD, HTN, HLD, COPD, CKD Stage 2, gout, Asthma, and syncope, chronic pain, RLS, migraines, IBS,vaping as evidenced by patient report and taking all medications verbalize basic understanding of  CAD, HTN, HLD, COPD, CKD Stage 2, Gout, Asthma, and syncope, chronic pain, RLS, migraines,IBS,  vaping  disease process and self health management plan as evidenced by patient report and taking all medications take all medications exactly as prescribed and will call provider for medication related questions as evidenced by patient report demonstrate understanding of rationale for each prescribed medication as evidenced by patient report attend all scheduled medical appointments as evidenced by patient report demonstrate Improved adherence to prescribed treatment plan for CAD, HTN,  HLD, COPD, CKD Stage 2, Gout, Asthma, and syncope, chronic pain, RLS, migraines, IBS, vaping  as evidenced by patient report and taking all medications continue to work with RN Care Manager to address care management and care coordination needs related to  CAD, HTN, HLD, COPD, CKD Stage 2, Gout, and Asthma, syncope, chronic pain, RLS, migraines, IBS, vaping as evidenced by adherence to CM Team Scheduled appointments through collaboration with RN Care manager, provider, and care team.   Interventions: Inter-disciplinary care team collaboration (see longitudinal plan of care) Evaluation of current treatment plan related to  self management and patient's adherence to plan as established by provider  Asthma: (Status:New goal.) Long Term Goal Provided education about and advised patient to utilize infection prevention strategies to reduce risk of respiratory infection Discussed the importance of adequate rest and management of fatigue with Asthma Assessed social determinant of health barriers   CAD Interventions: (Status:  New goal.) Long Term Goal Assessed understanding of CAD  diagnosis Medications reviewed including medications utilized in CAD treatment plan Reviewed Importance of taking all medications as prescribed Reviewed Importance of attending all scheduled provider appointments Advised patient to discuss medications with provider Assessed social determinant of health barriers   Chronic Kidney Disease Interventions:  (Status:  New goal.) Long Term Goal Evaluation of current treatment plan related to chronic kidney disease self management and patient's adherence to plan as established by provider      Reviewed medications with patient and discussed importance of compliance    Advised patient, providing education and rationale, to monitor blood pressure daily and record, calling PCP for findings outside established parameters    Reviewed scheduled/upcoming provider appointments including    Discussed plans with patient for ongoing care management follow up and provided patient with direct contact information for care management team    Assessed social determinant of health barriers    Last practice recorded BP readings:  BP Readings from Last 3 Encounters:  11/19/22 98/60  11/10/22 (!) 146/73  11/10/22 (!) 140/84  Most recent eGFR/CrCl: No results found for: "EGFR"  No components found for: "CRCL"  COPD Interventions:  (Status:  New goal.) Long Term Goal Provided education about and advised patient to utilize infection prevention strategies to reduce risk of respiratory infection Discussed the importance of adequate rest and management of fatigue with COPD Assessed social determinant of health barriers  Hyperlipidemia Interventions:  (Status:  New goal.) Long Term Goal Medication review performed; medication list updated in electronic medical record.  Assessed social determinant of health barriers   Hypertension Interventions:  (Status:  New goal.) Long Term Goal Last practice recorded BP readings:  BP Readings from Last 3 Encounters:  11/19/22 98/60   11/10/22 (!) 146/73  11/10/22 (!) 140/84  Most recent eGFR/CrCl: No results found for: "EGFR"  No components found for: "CRCL"  Evaluation of current treatment plan related to hypertension self management and patient's adherence to plan as established by provider Reviewed medications with patient and discussed importance of compliance Discussed plans with patient for ongoing care management follow up and provided patient with direct contact information for care management team Advised patient, providing education and rationale, to monitor blood pressure daily and record, calling PCP for findings outside established parameters Reviewed scheduled/upcoming provider appointments including:  Advised patient to discuss medication  with provider Assessed social determinant of health barriers  Pain Interventions:  (Status:  New goal.) Long Term Goal Pain assessment performed Medications reviewed Reviewed provider established plan for pain management Discussed  importance of adherence to all scheduled medical appointments Counseled on the importance of reporting any/all new or changed pain symptoms or management strategies to pain management provider Advised patient to report to care team affect of pain on daily activities Discussed use of relaxation techniques and/or diversional activities to assist with pain reduction (distraction, imagery, relaxation, massage, acupressure, TENS, heat, and cold application Reviewed with patient prescribed pharmacological and nonpharmacological pain relief strategies Assessed social determinant of health barriers  Patient Goals/Self-Care Activities: Take all medications as prescribed Attend all scheduled provider appointments Call pharmacy for medication refills 3-7 days in advance of running out of medications Perform all self care activities independently  Perform IADL's (shopping, preparing meals, housekeeping, managing finances) independently Call provider  office for new concerns or questions  Patient to follow up with provider regarding cough, BP medication, RLS  Follow Up Plan:  The patient has been provided with contact information for the care management team and has been advised to call with any health related questions or concerns.  The care management team will reach out to the patient again over the next 30 business  days.

## 2022-12-11 ENCOUNTER — Ambulatory Visit: Payer: Medicaid Other | Admitting: Family Medicine

## 2022-12-11 VITALS — BP 148/71 | HR 88 | Temp 97.3°F | Ht 60.0 in | Wt 145.4 lb

## 2022-12-11 DIAGNOSIS — R052 Subacute cough: Secondary | ICD-10-CM | POA: Diagnosis not present

## 2022-12-11 MED ORDER — ALBUTEROL SULFATE HFA 108 (90 BASE) MCG/ACT IN AERS: 2.0000 | INHALATION_SPRAY | RESPIRATORY_TRACT | 0 refills | Status: AC | PRN

## 2022-12-11 MED ORDER — PREDNISONE 20 MG PO TABS: 40.0000 mg | ORAL_TABLET | Freq: Every day | ORAL | 0 refills | Status: AC

## 2022-12-11 MED ORDER — AZITHROMYCIN 250 MG PO TABS: ORAL_TABLET | ORAL | 0 refills | Status: DC

## 2022-12-11 NOTE — Progress Notes (Signed)
    SUBJECTIVE:   CHIEF COMPLAINT / HPI:   Subacute cough - Reports that she had a cough, congestion, headache that started on Christmas Day - A few days later was noted to have increasing green phlegm/productive cough - Phlegm improved about 4 days ago - Suddenly last night started having worsening breathing with her wheezing - Feels short of breath with simple activities - Has never been hospitalized for pneumonia - Reports that she has missed diagnosis COPD and her pulmonologist feels that it was more related to reflux and stopped her inhalers at that time. - Quit smoking in the last year currently vapes daily (one of her vapes has nicotine product and that the others do not)   PERTINENT  PMH / PSH: Reviewed  OBJECTIVE:   BP (!) 148/71   Pulse 88   Temp (!) 97.3 F (36.3 C)   Ht 5' (1.524 m)   Wt 145 lb 6.4 oz (66 kg)   SpO2 100%   BMI 28.40 kg/m   Gen: Overall well-appearing and in NAD HEENT: no cervical LAD, mild pharyngeal erythema, no sinus tenderness to palpation, TM view occluded by cerumen bilaterally CV: RRR, no m/r/g appreciated, no peripheral edema Pulm: Diffuse rhonchi and wheezing, unable to take deep breaths without significant dry coughing  ASSESSMENT/PLAN:   Subacute cough Subacute cough that started several weeks ago with acute worsening in the last day or so with wheezing.  Given the relapse, concern for worsening lung etiology.  Review of pulmonology visit on 11/17 shows that they feel COPD is misdiagnosed and patient was in a reflux cycle.  At that time, she was treated with aggressive acid suppression and a prednisone taper, which improved symptoms.  Symptoms did worsen after carotid artery procedure (which she said was expected).  Do wonder if there is some component of allergic rhinitis/asthma or bronchitis that is contributing.  Given the concerning symptoms, do feel like treatment with steroids and antibiotics is appropriate, especially as we are  approaching the weekend. - CXR ordered - Azithromycin 500 mg on day 1 followed by 250 mg x 4 days - Prednisone 40 mg daily x 5 days - Albuterol inhaler as needed - Strict ER and return precautions given     Diana [name], [place]

## 2022-12-11 NOTE — Patient Instructions (Addendum)
Given that you are having improvement in her symptoms and then started getting worse and having wheezing I am concerned that we need to treat you with steroids and antibiotics.  I have sent in a prescription for 5 days of prednisone as well as 5 days of an antibiotic called azithromycin.  I also sent in a refill of albuterol inhaler as needed for your wheezing.  It does appear that your pulmonologist recommended having this on hand just in case.  I have ordered an x-ray of your chest that you can do over at [facility] ([address]), this can be done as a walk-in and does not require an appointment.  This is mainly so we can see there is any signs of larger infection or concerning findings.  If you feel significantly short of breath, especially over the weekend then I recommend going to do an urgent care or the ER to get evaluated

## 2022-12-11 NOTE — Assessment & Plan Note (Signed)
Subacute cough that started several weeks ago with acute worsening in the last day or so with wheezing.  Given the relapse, concern for worsening lung etiology.  Review of pulmonology visit on 11/17 shows that they feel COPD is misdiagnosed and patient was in a reflux cycle.  At that time, she was treated with aggressive acid suppression and a prednisone taper, which improved symptoms.  Symptoms did worsen after carotid artery procedure (which she said was expected).  Do wonder if there is some component of allergic rhinitis/asthma or bronchitis that is contributing.  Given the concerning symptoms, do feel like treatment with steroids and antibiotics is appropriate, especially as we are approaching the weekend. - CXR ordered - Azithromycin 500 mg on day 1 followed by 250 mg x 4 days - Prednisone 40 mg daily x 5 days - Albuterol inhaler as needed - Strict ER and return precautions given

## 2022-12-12 ENCOUNTER — Telehealth: Payer: Self-pay

## 2022-12-12 NOTE — Telephone Encounter (Signed)
Patient calls nurse line stating that she was never able to get her CXR at [facility]. She states that they had to close early today and are closed tomorrow and Monday for the holiday.   Called and spoke with Scheduling department at [facility]. They are able to do walk in X-rays on Saturday, patient will need to register through the ED.   Changed order to reflect [facility], Dr. Ardelia Mems- cosign.   Called patient and provided with update. Patient appreciative.   Talbot Grumbling, RN

## 2022-12-12 NOTE — Addendum Note (Signed)
Addended by: Talbot Grumbling on: 12/12/2022 05:30 PM   Modules accepted: Orders

## 2022-12-13 ENCOUNTER — Encounter (HOSPITAL_COMMUNITY): Payer: Self-pay

## 2022-12-13 ENCOUNTER — Emergency Department (HOSPITAL_COMMUNITY): Payer: Medicaid Other

## 2022-12-13 ENCOUNTER — Other Ambulatory Visit: Payer: Self-pay

## 2022-12-13 ENCOUNTER — Emergency Department (HOSPITAL_COMMUNITY)
Admission: EM | Admit: 2022-12-13 | Discharge: 2022-12-13 | Discharge status: Against Medical Advice | Payer: Medicaid Other | Attending: Emergency Medicine | Admitting: Emergency Medicine

## 2022-12-13 DIAGNOSIS — R059 Cough, unspecified: Secondary | ICD-10-CM | POA: Diagnosis not present

## 2022-12-13 DIAGNOSIS — Z5321 Procedure and treatment not carried out due to patient leaving prior to being seen by health care provider: Secondary | ICD-10-CM | POA: Diagnosis not present

## 2022-12-13 NOTE — ED Provider Triage Note (Signed)
Emergency Medicine Provider Triage Evaluation Note  Diana [name] , a 67 y.o. female  was evaluated in triage.  Pt complains of cough and cold symptoms since Christmas.  Patient was questioning sent over to the ER for chest x-ray but this was to be outpatient.  She is already on azithromycin and prednisone.  She is not having any shortness of breath or chest pain. No fevers.  Review of Systems  Positive:  Negative:   Physical Exam  BP 137/76 (BP Location: Right Arm)   Pulse 87   Temp 98.5 F (36.9 C) (Oral)   Resp 18   Ht 5' (1.524 m)   Wt 66 kg   SpO2 95%   BMI 28.42 kg/m  Gen:   Awake, no distress   Resp:  Normal effort  MSK:   Moves extremities without difficulty  Other:    Medical Decision Making  Medically screening exam initiated at 5:10 PM.  Appropriate orders placed.  Diana [name] was informed that the remainder of the evaluation will be completed by another provider, this initial triage assessment does not replace that evaluation, and the importance of remaining in the ED until their evaluation is complete.  CXR ordered with labs.  Patient reports that she would rather just follow-up with her PCP on Tuesday.  Discussed with her that chest x-ray could show signs of fluid on her lungs or worsening pneumonia.  Patient with slight fall with her PCP.   Sherrell Puller, PA-C 12/13/22 1711

## 2022-12-13 NOTE — ED Triage Notes (Signed)
Pt c/o dry cough and was having green nasal drainage, but that has stoppedx56mo Pt's PCP sent pt here to get a chest x-ray. Pt c/o SOB. Pt eupneic and not in resp distress at this time.

## 2022-12-17 ENCOUNTER — Other Ambulatory Visit: Payer: Self-pay

## 2022-12-17 DIAGNOSIS — K581 Irritable bowel syndrome with constipation: Secondary | ICD-10-CM

## 2022-12-17 MED ORDER — LINACLOTIDE 145 MCG PO CAPS: ORAL_CAPSULE | ORAL | 5 refills | Status: AC

## 2022-12-18 DIAGNOSIS — R413 Other amnesia: Secondary | ICD-10-CM | POA: Diagnosis not present

## 2022-12-18 DIAGNOSIS — K5903 Drug induced constipation: Secondary | ICD-10-CM | POA: Diagnosis not present

## 2022-12-18 DIAGNOSIS — G894 Chronic pain syndrome: Secondary | ICD-10-CM | POA: Diagnosis not present

## 2022-12-18 DIAGNOSIS — Z79891 Long term (current) use of opiate analgesic: Secondary | ICD-10-CM | POA: Diagnosis not present

## 2022-12-18 DIAGNOSIS — M5416 Radiculopathy, lumbar region: Secondary | ICD-10-CM | POA: Diagnosis not present

## 2022-12-24 ENCOUNTER — Other Ambulatory Visit: Payer: Self-pay

## 2022-12-24 DIAGNOSIS — F419 Anxiety disorder, unspecified: Secondary | ICD-10-CM

## 2022-12-25 MED ORDER — VENLAFAXINE HCL ER 37.5 MG PO CP24: 37.5000 mg | ORAL_CAPSULE | Freq: Every day | ORAL | 2 refills | Status: DC

## 2022-12-29 ENCOUNTER — Encounter: Payer: Self-pay | Admitting: Family Medicine

## 2022-12-29 ENCOUNTER — Telehealth: Payer: Self-pay | Admitting: Family Medicine

## 2022-12-29 ENCOUNTER — Ambulatory Visit: Payer: Medicaid Other | Admitting: Family Medicine

## 2022-12-29 ENCOUNTER — Ambulatory Visit (HOSPITAL_COMMUNITY)
Admission: RE | Admit: 2022-12-29 | Discharge: 2022-12-29 | Disposition: A | Discharge status: Home / Self Care | Payer: Medicaid Other | Source: Ambulatory Visit | Attending: Family Medicine | Admitting: Family Medicine

## 2022-12-29 VITALS — BP 82/53 | HR 85 | Ht 61.0 in | Wt 143.4 lb

## 2022-12-29 DIAGNOSIS — R1011 Right upper quadrant pain: Secondary | ICD-10-CM | POA: Insufficient documentation

## 2022-12-29 DIAGNOSIS — I10 Essential (primary) hypertension: Secondary | ICD-10-CM

## 2022-12-29 DIAGNOSIS — N390 Urinary tract infection, site not specified: Secondary | ICD-10-CM

## 2022-12-29 LAB — POCT URINALYSIS DIP (MANUAL ENTRY)
Bilirubin, UA: NEGATIVE
Glucose, UA: NEGATIVE mg/dL
Nitrite, UA: NEGATIVE
Spec Grav, UA: 1.015 (ref 1.010–1.025)
Urobilinogen, UA: 0.2 E.U./dL
pH, UA: 5.5 (ref 5.0–8.0)

## 2022-12-29 MED ORDER — NITROFURANTOIN MONOHYD MACRO 100 MG PO CAPS: 100.0000 mg | ORAL_CAPSULE | Freq: Two times a day (BID) | ORAL | 0 refills | Status: AC

## 2022-12-29 NOTE — Assessment & Plan Note (Addendum)
Acute onset this morning.  Broad differential which includes both benign and worrisome etiologies. I am quite reassured that patient looks well and nontoxic in exam room.  She is hypotensive, but I believe on chronic intermittent basis, see HTN A&P for more.  Given patient has surgically absent gallbladder, appendix, and uterus, this helps narrow our differential slightly.  Differential for her upper quadrant pain includes hepatitis, transaminitis, blockage or inflammation of common bile duct, pancreatitis, peptic ulcer, abnormal presentation of cardiac etiology such as N/STEMI, PE, constipation, and MSK.  Less likely constipation given patient says this is quite different.  Less likely PE given normal SpO2 and pulse.  Unclear if MSK as I cannot reproduce pain on palpation.  EKG today stable from previous. - CMP, CBC, lipase, UA today - RUQ [place] - Keep previously scheduled follow-up with cardiologist tomorrow 1/30 (I'm hoping lab work will be back in time for cardiology review) - Strict ED precautions If the above are unremarkable and the pain is persistent, could consider CT or MRI abdomen.

## 2022-12-29 NOTE — Assessment & Plan Note (Addendum)
Patient hypotensive; I suspect her worsened dizzy episodes are due to low blood pressure.  Patient has already taken her olmesartan for the day, recommend she hold it tomorrow so cardiology can assess.  Return to ED precautions given, patient voices understanding.

## 2022-12-29 NOTE — Telephone Encounter (Signed)
Called to discuss UA results. Confirmed correct patient. We collected UA today given broad differential for RUQ and flank pain, as well as reported vaginal discomfort and itching.   UA with leuks, protein, hematuria, and ketones. Could possibly attribute to UTI given hematuria and leukocytes. Will treat with Macrobid twice daily x 5 days.  I will call her with the other lab results once they are back.  Ezequiel Essex, MD

## 2022-12-29 NOTE — Progress Notes (Signed)
SUBJECTIVE:   CHIEF COMPLAINT / HPI:   Right flank pain Ms. Updegraff is a 67 year old woman who presents today with her ex-husband for concern of right flank pain.  Onset 6 AM this morning.  Intermittent episodes lasting 2 to 3 minutes each.  Patient reports sharp pain that "moves around a little bit" but has no radiation.  Sometimes occurs in RUQ and midclavicular line, sometimes right lateral ribs.  Patient cannot identify any patterns including onset with certain food or drink, limiting or inciting movements, etc.  Last BM 2 to 3 days ago.  Constipation is a chronic problem, she currently takes Linzess and Amitiza.  She specifies this RUQ and flank pain feels different than her normal constipation discomfort.  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  Clinic measurements below. Per patient, home BP systolics in the last couple of days have been 140s.  She reports no changes to her medications recently.  Home meds: Olmesartan 20 mg daily  Initial manual BP: 88/54 Sitting 97/56, HR 80s Standing 82/53, HR 80s  Dizziness Patient reports worsened episodes of dizziness for the last couple of weeks.  Reports being very dizzy when changing position (especially laying down and sitting up) as well as when looking up.  Known history of dizziness, presyncope, and syncope.  During 2 syncope admissions last year, echo was normal, CT head negative for stroke.  Cardiology and vascular surgery since believe these episodes are vasovagal.  Pertinent history: - bilateral carotid artery stenosis on aspirin and plavix - R symptomatic carotid artery stenosis s/p R CEA 10/27/22 - HLD on pitavastatin 1 mg twice weekly, alirocumab (praluent) q2wks - known  2nd degree heart block  PERTINENT  PMH / PSH: HTN, HLD, upper airway cough syndrome, syncope, prior tobacco use (60-pack-year history), CKD2, second-degree heart block, incomplete left bundle branch block, symptomatic right carotid artery stenosis s/p right CEA 10/27/2022, left carotid artery stenosis, craniosynostosis s/p craniectomy   OBJECTIVE:   BP (!) 82/53 (Patient Position: Standing)   Pulse 85   Ht 5' 1"$  (1.549 m)   Wt 143 lb 6.4 oz (65 kg)   SpO2 98%   BMI 27.10 kg/m    PHQ-9:     12/29/2022    3:31 PM 12/11/2022    4:49 PM 11/10/2022    4:21 PM  Depression screen PHQ 2/9  Decreased Interest 0 0 0  Down, Depressed, Hopeless 0 0 0  PHQ - 2 Score 0 0 0  Altered sleeping 0 0 0  Tired, decreased energy 0 0 0  Change in appetite 0 0 0  Feeling bad or failure about yourself  0 0 0  Trouble concentrating 1 0 0  Moving slowly or fidgety/restless 0 0 0  Suicidal thoughts 0 0 0  PHQ-9 Score 1 0 0  Difficult doing work/chores Somewhat difficult Not difficult at all Not difficult at all    Physical Exam General: Awake, alert, oriented Cardiovascular: Regular rate and rhythm, S1 and S2 present, no murmurs auscultated, strong right radial pulse Respiratory: Lung fields clear to auscultation bilaterally anterior and posteriorly Abdomen: Soft, nondistended, hyperactive bowel sounds in RUQ, mild to moderate TTP of RUQ without Murphy sign, no rebound tenderness or guarding, skin appears normal without any overlying rashes lesions or  ecchymoses  ASSESSMENT/PLAN:   RUQ pain Acute onset this morning.  Broad differential which includes both benign and worrisome etiologies. I am quite reassured that patient looks well and nontoxic in exam room.  She is hypotensive, but I believe on chronic intermittent basis, see HTN A&P for more.  Given patient has surgically absent gallbladder, appendix, and uterus, this helps narrow our differential slightly.  Differential for her upper quadrant pain  includes hepatitis, transaminitis, blockage or inflammation of common bile duct, pancreatitis, peptic ulcer, abnormal presentation of cardiac etiology such as N/STEMI, PE, constipation, and MSK.  Less likely constipation given patient says this is quite different.  Less likely PE given normal SpO2 and pulse.  Unclear if MSK as I cannot reproduce pain on palpation.  EKG today stable from previous. - CMP, CBC, lipase, UA today - RUQ [place] - Keep previously scheduled follow-up with cardiologist tomorrow 1/30 (I'm hoping lab work will be back in time for cardiology review) - Strict ED precautions If the above are unremarkable and the pain is persistent, could consider CT or MRI abdomen.  Hypertension, essential, benign Patient hypotensive; I suspect her worsened dizzy episodes are due to low blood pressure.  Patient has already taken her olmesartan for the day, recommend she hold it tomorrow so cardiology can assess.  Return to ED precautions given, patient voices understanding.     Ezequiel Essex, MD [place]

## 2022-12-29 NOTE — Patient Instructions (Signed)
It was wonderful to see you today. Thank you for allowing me to be a part of your care. Below is a short summary of what we discussed at your visit today:  Blood pressure STOP your blood pressure medication.  Follow-up with your cardiologist tomorrow.  Right abdominal and side pain There are a lot of things this could be.  Is hard to say at this time.  Today we got blood work to check your blood counts, electrolytes, liver and kidney function, and pancreatic inflammation. If the results are normal, I will send you a letter or MyChart message. If the results are abnormal, I will give you a call.    We did an EKG today.  I will tell you how it looks before you leave.  I have ordered an ultrasound of your liver and surrounding area.  Our nurse will schedule it for you and give you a call with the appointment date and time.  Reasons to go to the emergency room or call 911: - Worsening pain - Worsening pain associated with worsened dizziness and shortness of breath - Feeling like you will pass out - Sudden tearing chest or belly pain    If you have any questions or concerns, please do not hesitate to contact us via phone or MyChart message.   Ezequiel Essex, MD

## 2022-12-30 ENCOUNTER — Ambulatory Visit
Admission: RE | Admit: 2022-12-30 | Discharge: 2022-12-30 | Disposition: A | Discharge status: Home / Self Care | Payer: Medicaid Other | Source: Ambulatory Visit | Attending: Family Medicine | Admitting: Family Medicine

## 2022-12-30 ENCOUNTER — Encounter: Payer: Self-pay | Admitting: Cardiology

## 2022-12-30 ENCOUNTER — Ambulatory Visit: Payer: Medicaid Other | Attending: Cardiology | Admitting: Cardiology

## 2022-12-30 VITALS — BP 120/72 | HR 79 | Ht 61.0 in | Wt 142.0 lb

## 2022-12-30 DIAGNOSIS — E785 Hyperlipidemia, unspecified: Secondary | ICD-10-CM

## 2022-12-30 DIAGNOSIS — I491 Atrial premature depolarization: Secondary | ICD-10-CM

## 2022-12-30 DIAGNOSIS — I4719 Other supraventricular tachycardia: Secondary | ICD-10-CM

## 2022-12-30 DIAGNOSIS — I6523 Occlusion and stenosis of bilateral carotid arteries: Secondary | ICD-10-CM

## 2022-12-30 DIAGNOSIS — I2510 Atherosclerotic heart disease of native coronary artery without angina pectoris: Secondary | ICD-10-CM | POA: Diagnosis not present

## 2022-12-30 DIAGNOSIS — Z720 Tobacco use: Secondary | ICD-10-CM

## 2022-12-30 LAB — CBC
Hematocrit: 35.6 % (ref 34.0–46.6)
Hemoglobin: 12.1 g/dL (ref 11.1–15.9)
MCH: 29.3 pg (ref 26.6–33.0)
MCHC: 34 g/dL (ref 31.5–35.7)
MCV: 86 fL (ref 79–97)
Platelets: 238 10*3/uL (ref 150–450)
RBC: 4.13 x10E6/uL (ref 3.77–5.28)
RDW: 11.8 % (ref 11.7–15.4)
WBC: 13.5 10*3/uL — ABNORMAL HIGH (ref 3.4–10.8)

## 2022-12-30 LAB — COMPREHENSIVE METABOLIC PANEL
ALT: 20 IU/L (ref 0–32)
AST: 23 IU/L (ref 0–40)
Albumin/Globulin Ratio: 2 (ref 1.2–2.2)
Albumin: 4.4 g/dL (ref 3.9–4.9)
Alkaline Phosphatase: 103 IU/L (ref 44–121)
BUN/Creatinine Ratio: 10 — ABNORMAL LOW (ref 12–28)
BUN: 11 mg/dL (ref 8–27)
Bilirubin Total: 0.6 mg/dL (ref 0.0–1.2)
CO2: 21 mmol/L (ref 20–29)
Calcium: 9.3 mg/dL (ref 8.7–10.3)
Chloride: 94 mmol/L — ABNORMAL LOW (ref 96–106)
Creatinine, Ser: 1.14 mg/dL — ABNORMAL HIGH (ref 0.57–1.00)
Globulin, Total: 2.2 g/dL (ref 1.5–4.5)
Glucose: 90 mg/dL (ref 70–99)
Potassium: 4.7 mmol/L (ref 3.5–5.2)
Sodium: 132 mmol/L — ABNORMAL LOW (ref 134–144)
Total Protein: 6.6 g/dL (ref 6.0–8.5)
eGFR: 54 mL/min/{1.73_m2} — ABNORMAL LOW (ref >59)

## 2022-12-30 LAB — LIPASE: Lipase: 19 U/L (ref 14–72)

## 2022-12-30 NOTE — Patient Instructions (Addendum)
Medication Instructions:  Your physician has recommended you make the following change in your medication:  STOP: Olmesartan due to recent systolic blood pressure under 100 Please take your blood pressure twice daily for 2 weeks and send in a MyChart message. Please include heart rates. If greater than 140/90 for 2 consecutive days please contact office.    HOW TO TAKE YOUR BLOOD PRESSURE: Rest 5 minutes before taking your blood pressure. Don't smoke or drink caffeinated beverages for at least 30 minutes before. Take your blood pressure before (not after) you eat. Sit comfortably with your back supported and both feet on the floor (don't cross your legs). Elevate your arm to heart level on a table or a desk. Use the proper sized cuff. It should fit smoothly and snugly around your bare upper arm. There should be enough room to slip a fingertip under the cuff. The bottom edge of the cuff should be 1 inch above the crease of the elbow. Ideally, take 3 measurements at one sitting and record the average.  *If you need a refill on your cardiac medications before your next appointment, please call your pharmacy*   Lab Work: Your physician recommends that you return for lab work in [place]:  Lipids - please go fasting If you have labs (blood work) drawn today and your tests are completely normal, you will receive your results only by: [place] (if you have MyChart) OR A paper copy in the mail If you have any lab test that is abnormal or we need to change your treatment, we will call you to review the results.   Testing/Procedures: None   Follow-Up: At [facility], you and your health needs are our priority.  As part of our continuing mission to provide you with exceptional heart care, we have created designated Provider Care Teams.  These Care Teams include your primary Cardiologist (physician) and Advanced Practice Providers (APPs -  Physician Assistants and Nurse  Practitioners) who all work together to provide you with the care you need, when you need it.  We recommend signing up for the patient portal called "MyChart".  Sign up information is provided on this After Visit Summary.  MyChart is used to connect with patients for Virtual Visits (Telemedicine).  Patients are able to view lab/test results, encounter notes, upcoming appointments, etc.  Non-urgent messages can be sent to your provider as well.   To learn more about what you can do with MyChart, go to [email or web address].    Your next appointment:   6 month(s)  Provider:   Berniece Salines, DO

## 2022-12-31 ENCOUNTER — Telehealth: Payer: Self-pay | Admitting: Family Medicine

## 2022-12-31 NOTE — Telephone Encounter (Signed)
Called to discuss labs with patient. Positively identified with DOB. Discussed slight white count, elevated creatinine and abnormalities in Na and Cl. Recommend increased hydration with water and electrolytes. Possible the mild leukocytosis from UTI.   Discussed cardiology visit yesterday. Awaiting full note, but olmesartan was stopped.   CT chest lung ca screen completed yesterday too, awaiting full read on that.  Return precautions discussed. All questions answered.   Ezequiel Essex, MD

## 2023-01-01 ENCOUNTER — Encounter: Payer: Self-pay | Admitting: Cardiology

## 2023-01-02 ENCOUNTER — Ambulatory Visit (HOSPITAL_COMMUNITY)
Admission: RE | Admit: 2023-01-02 | Discharge: 2023-01-02 | Disposition: A | Discharge status: Home / Self Care | Payer: Medicaid Other | Source: Ambulatory Visit | Attending: Family Medicine | Admitting: Family Medicine

## 2023-01-02 ENCOUNTER — Telehealth: Payer: Self-pay | Admitting: Family Medicine

## 2023-01-02 ENCOUNTER — Encounter: Payer: Self-pay | Admitting: Family Medicine

## 2023-01-02 DIAGNOSIS — R1011 Right upper quadrant pain: Secondary | ICD-10-CM | POA: Diagnosis not present

## 2023-01-02 NOTE — Telephone Encounter (Signed)
Called patient to review labs.  Identified correct patient with date of birth.  Ultrasound showed coarsened hepatic parenchymal echogenicity, but this is nonspecific.  Patient has no known history of fatty liver or cirrhosis.  Denies heavy alcohol use.  Does have significant tobacco use history, but recently quit.  No history of diabetes either.  Patient today reports that the RUQ pain is still present and is now even sometimes present in the substernal area.  She reports that today, she drank water and had a burning pain down her sternum and to the right side of her chest.  Given her significant GI history, especially with Barrett's esophagus, I would like her to go see GI.  At this time, I do not believe there is any acute pathology with her heart, pancreas, or liver that is causing this pain.  Surgically absent gallbladder, appendix, and uterus which narrows the differential.  Will send message to last GI providers she saw to assist with getting her in sooner.  Looks like she is next due for upper scope in 2025, however GI may want to do one sooner given her new symptoms.  Ezequiel Essex, MD

## 2023-01-04 NOTE — Progress Notes (Signed)
Cardiology Office Note:    Date:  01/04/2023   ID:  Diana [name], DOB 06-25-1956, MRN [id number]  PCP:  Alcus Dad, MD  Cardiologist:  Berniece Salines, DO  Electrophysiologist:  None   Referring MD: Alcus Dad, MD   " I am doing well"  History of Present Illness:    Diana [name] is a 67 y.o. female with a hx of  Hollenhorst plaque, hyperlipidemia, hypercholesterolemia, hypertension, Mild coronary artery disease on coronary CT done 10/2022 and tobacco abuse. I saw the patient on 02/10/2020 at that time we discussed her monitor result, and due to the evidence of second degree AV block, I stopped her beta-blocker and referred her to EP.  In the meantime we held her surgery once she was evaluated by EP.    Due to improvement of beta-blocker there is no indication for pacemaker.  Her blood work shows significant elevated LDL she was started on Livalo and Zetia.  I saw the patient March 20, 2020 at that time she was experiencing some leg swelling we therefore did a ultrasound duplex to rule out DVT.  We did discuss that we were going to repeat her lipid profile and then send the patient for PCSK9 inhibitors.  I saw the patient in 10/2021 at that time she was doing well.   At her last visit with me she was status post hospitalization from stroke.  She was planning right carotid artery started.  She had been asked to see me given her prior cardiac history.  During that time I placed a monitor on the patient.  She did wear the monitor with no significant concerns.  She is here today with her significant other.  She has no specific complaints but tells me that her she has had some low blood pressure at her PCP office.  She has not had any syncope episodes.  She has had her surgery and she is recovering well.    Past Medical History:  Diagnosis Date   Acute gout involving toe of left foot 10/12/2019   Anxiety    Asthma    Barrett esophagus    "don't know that I've ever been stretched"  (08/17/2013)   Bilateral leg edema 07/16/2020   Choledocholithiasis with acute cholecystitis    Chronic lower back pain    COPD (chronic obstructive pulmonary disease) ([place])    Craniosynostosis    congenital   DDD (degenerative disc disease)    Family history of anesthesia complication    "PONV; both parents" (08/17/2013)   GERD (gastroesophageal reflux disease)    H/O hiatal hernia    H/O: hysterectomy 09/22/2014   By her report at age 48. Has never been on Norma replacement therapy. 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 Switched to lovastatin b/c its $4.     Hypertension    Hyponatremia 08/28/2020   IBS (irritable bowel syndrome)    Kidney stones 07/22/2017   Migraines    "since age 65; probably twice/month" (08/17/2013)   PONV (postoperative nausea and vomiting)    Postmenopausal atrophic vaginitis 09/22/2014   History of hysterectomy. We'll try adding estradiol cream 3 times a week. I would give this for 8 weeks and she's not having some improvement, I would have her return to clinic. If she is  having improvement she can decrease the frequency of her applications to once or twice a week. If she's not had improvement then I would consider increasing him to 7 days a week. 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   Shingles    Sinus pause 02/10/2020   Sleep apnea-like behavior 03/20/2020   Takes dietary supplements 07/28/2020    Past Surgical History:  Procedure Laterality Date   ABDOMINAL HYSTERECTOMY  1992   Total for chronic pelvic pain    APPENDECTOMY     BACK SURGERY  1990's   L4-L5 fusion @ [facility]  05/28/2019   fused C4-5-6, done in [place]  2007   Performed in [place]   "1st time got infected; 2nd time didn't take; fix the 3rd time" (08/17/2013)   POSTERIOR LUMBAR FUSION  1990's   "took piece off my hip" (08/17/2013)   TOTAL HIP ARTHROPLASTY Right 12/31/04   AVN after fall injury; performed by Ralene Cork; DePuy S-ROM total hip (metal on metal)   UPPER GASTROINTESTINAL ENDOSCOPY      Current Medications: Current Meds  Medication Sig   acetaminophen-codeine (TYLENOL #3) 300-30 MG tablet Take 1 tablet by mouth 2 (two) times daily.   albuterol (VENTOLIN HFA) 108 (90 Base) MCG/ACT inhaler Inhale 2 puffs into the lungs every 4 (four) hours as needed for wheezing or shortness of breath.   Alirocumab (PRALUENT) 150 MG/ML SOAJ Inject 150 mg into the skin every 14 (fourteen) days.   allopurinol (ZYLOPRIM) 100 MG tablet Take 1 tablet (100 mg total) by mouth daily.   AMITIZA 24 MCG capsule Take 24 mcg by mouth daily.   aspirin EC 325 MG tablet Take 325 mg by mouth daily.   aspirin-acetaminophen-caffeine (EXCEDRIN MIGRAINE) 250-250-65 MG tablet Take 2 tablets by mouth every 6 (six) hours as needed for headache.   azelastine (ASTELIN) 0.1 % nasal spray Place into the nose in the morning and at bedtime.   azithromycin (ZITHROMAX Z-PAK) 250 MG tablet Take 2 tablets on day 1 and then follow with 1 tablet daily for 4 more days   clopidogrel (PLAVIX) 75 MG tablet Take 75 mg by mouth daily.   co-enzyme Q-10 30 MG capsule Take 100 mg by mouth daily.   Cyanocobalamin (B-12) 3000 MCG CAPS Take 1 capsule by mouth daily.   diclofenac sodium (VOLTAREN) 1 % GEL Apply 2 g topically 4 (four) times daily as needed.   esomeprazole (NEXIUM) 40 MG capsule TAKE 1 CAPSULE BY MOUTH DAILY AT 12 NOON   famotidine (PEPCID) 20 MG tablet One after supper   lidocaine (HM LIDOCAINE PATCH) 4 % Place 1 patch onto the skin daily.    linaclotide (LINZESS) 145 MCG CAPS capsule TAKE 1 CAPSULE BY MOUTH DAILY BEFORE BREAKFAST   naloxone (NARCAN) nasal spray 4 mg/0.1 mL Place into the nose as needed. overdose   [EXPIRED] nitrofurantoin, macrocrystal-monohydrate, (MACROBID) 100 MG capsule Take 1 capsule (100 mg total) by mouth 2 (two) times daily for 5 days.   Pitavastatin Calcium 1 MG TABS Take 1 tablet (1 mg total) by mouth 2 (two) times a week.   SUMAtriptan (IMITREX) 50 MG tablet Take 1 tablet (50 mg total)  by mouth every 2 (two) hours as needed for migraine. May repeat in 2 hours if headache persists or recurs.   Turmeric 500 MG CAPS Take 2 tablets by mouth daily.   venlafaxine XR (EFFEXOR-XR) 37.5 MG 24 hr capsule Take 1 capsule (37.5 mg total) by mouth daily with breakfast.   [DISCONTINUED] olmesartan (BENICAR) 20 MG tablet Take 1 tablet (20 mg total) by mouth daily.     Allergies:   Penicillins, Demerol [meperidine], Doxycycline hyclate, Gabapentin, Sulfa antibiotics, Atorvastatin, Fluvastatin, Levaquin [levofloxacin], Lovastatin, No known allergies, Pitavastatin, Pravastatin, Rosuvastatin, Simvastatin, Benadryl [diphenhydramine hcl], Buprenorphine hcl, Cephalexin, Morphine and related, and Spiriva handihaler [tiotropium bromide monohydrate]   Social History   Socioeconomic History   Marital status: Divorced    Spouse name: Not on file   Number of children: 2   Years of education: Not on file   Highest education level: Not on file  Occupational History   Occupation: retired  Tobacco Use   Smoking status: Former    Packs/day: 0.50    Years: 32.00    Total pack years: 16.00    Types: Cigarettes    Quit date: 05/30/2022    Years since quitting: 0.6    Passive exposure: Past   Smokeless tobacco: Never   Tobacco comments:    on chanix  Vaping Use   Vaping Use: Every day  Substance and Sexual Activity   Alcohol use: Yes    Alcohol/week: 0.0 standard drinks of alcohol    Comment: occasionally   Drug use: No    Sexual activity: Not Currently  Other Topics Concern   Not on file  Social History Narrative   Not on file   Social Determinants of Health   Financial Resource Strain: Not on file  Food Insecurity: Not on file  Transportation Needs: No Transportation Needs (12/10/2022)   PRAPARE - Hydrologist (Medical): No    Lack of Transportation (Non-Medical): No  Physical Activity: Not on file  Stress: Not on file  Social Connections: Not on file     Family History: The patient's family history includes Breast cancer in her maternal grandmother; Colon cancer in her paternal grandfather; Hypertension in her father and mother; Skin cancer in her father. There is no history of Rectal cancer, Stomach cancer, or Esophageal cancer.  ROS:   Review of Systems  Constitution: Negative for decreased appetite, fever and weight gain.  HENT: Negative for congestion, ear discharge, hoarse voice and sore throat.   Eyes: Negative for discharge, redness, vision loss in right eye and visual halos.  Cardiovascular: Negative for chest pain, dyspnea on exertion, leg swelling, orthopnea and palpitations.  Respiratory: Negative for cough, hemoptysis, shortness of breath and snoring.   Endocrine: Negative for heat intolerance and polyphagia.  Hematologic/Lymphatic: Negative for bleeding problem. Does not bruise/bleed easily.  Skin: Negative for flushing, nail changes, rash and suspicious lesions.  Musculoskeletal: Negative for arthritis, joint pain, muscle cramps, myalgias, neck pain and stiffness.  Gastrointestinal: Negative for abdominal pain, bowel incontinence, diarrhea and excessive appetite.  Genitourinary: Negative for decreased libido, genital sores and incomplete emptying.  Neurological: Negative for brief paralysis, focal weakness, headaches and loss of balance.  Psychiatric/Behavioral: Negative for altered mental status, depression and suicidal ideas.  Allergic/Immunologic:  Negative for HIV exposure and persistent infections.    EKGs/Labs/Other Studies Reviewed:    The following studies were reviewed today:   EKG:  The ekg ordered today demonstrates   Zio monitor 11/29/202  Addendum  by Berniece Salines, DO on Wed Oct 29, 2022  8:47 AM   Patch Wear Time:  14 days and 0 hours (2023-10-28T15:18:41-0400 to 2023-11-11T14:18:41-0500)   Patient had a min HR of 62 bpm, max HR of 169 bpm, and avg HR of 90 bpm. Predominant underlying rhythm was Sinus Rhythm. QRS morphology changes were present throughout recording.    28 Supraventricular Tachycardia runs occurred, the run with the fastest interval lasting 7 beats with a max rate of 169 bpm, the longest lasting 14 beats with an avg rate of 109 bpm. Supraventricular Tachycardia was detected within +/- 45 seconds of symptomatic patient event(s). Isolated SVEs were rare (<1.0%), SVE Couplets  were rare (<1.0%), and SVE Triplets were rare (<1.0%). Isolated VEs were rare (<1.0%), and no VE Couplets or VE Triplets were present.   Symptoms associated with paroxysmal supraventricular tachycardia, rare premature atrial complex.   Conclusion: This study is remarkable for the following:                       1.  Symptomatic paroxysmal supraventricular tachycardia.                       2.  Symptomatic rare premature atrial complex   Compared to prior monitor done.  No evidence of AV blocks. CCTA 10/2022 TECHNIQUE: The patient was scanned on a [employer].   FINDINGS: A 100 kV prospective scan was triggered in the descending thoracic aorta at 111 HU's. Axial non-contrast 3 mm slices were carried out through the heart. The data set was analyzed on a dedicated work station and scored using the [place]. Gantry rotation speed was 250 msecs and collimation was .6 mm. No beta blockade and 0.8 mg of sl NTG was given. The 3D data set was reconstructed in 5% intervals of the 67-82 % of the R-R cycle. Diastolic  phases were analyzed on a dedicated work station using MPR, MIP and VRT modes. The patient received 80 cc of contrast.   Aorta: Normal size. Moderate aortic root calcifications. No dissection.   Aortic Valve:  Trileaflet.  No calcifications.   Coronary Arteries:  Normal coronary origin.  Right dominance.   RCA is a large dominant artery that gives rise to PDA and PLA. There is a mild focal calcification in the mid RCA.   Left main is a large artery that gives rise to LAD and LCX arteries.   LAD is a large vessel. Mild focal calcification in the proximal and mid LAD. The distal LAD with no plaques.   LCX is a non-dominant artery that gives rise to one large OM1 branch. There is no plaque.   Coronary Calcium Score:   Left main: 0   Left anterior descending artery: 246   Left circumflex artery: 0   Right coronary artery: 47.2   Total: 418   Percentile: 45   Other findings:   Normal pulmonary vein drainage into the left atrium.   Normal left atrial appendage without a thrombus.   Normal size of the pulmonary artery.   IMPRESSION: 1. Coronary calcium score of 418. This was 10 percentile for age and sex matched control.   2. Normal coronary origin with right dominance.   3. CAD-RADS 2. Mild non-obstructive CAD (25-49%). Consider non-atherosclerotic causes of chest pain. Consider preventive therapy and risk factor modification.   The noncardiac portion of this study will be interpreted in separate report by the radiologist.  Electronically Signed: By: Berniece Salines D.O. On: 11/19/2022 14:41  Stress echo SUMMARY  Normal left ventricular function at rest.  The estimated LV ejection fraction is 60-65% .  There were no segmental wall motion abnormalities at rest  Normal left ventricular function and global wall motion with stress.  There were no segmental wall motion abnormalities with dobutamine.  Negative dobutamine echocardiography for inducible ischemia at  target  heart rate.   TTE 09/20/2022 PROCEDURE  A two-dimensional transthoracic echocardiogram with color flow and Doppler was  performed. Image Quality: Technically adequate. Injection of agitated saline  contrast performed to evaluate for possible shunt.  -  SUMMARY  Normal echo doppler study  agitated saline study negative for shunt.  Injection of agitated saline contrast performed to evaluate for possible shunt   -  FINDINGS:  LEFT VENTRICLE  The left ventricular size is normal. There is normal left ventricular wall  thickness. LV ejection fraction = 55-60%. The left ventricular wall motion is  normal.   -  RIGHT VENTRICLE  The right ventricle is normal in size and function.   LEFT ATRIUM  The left atrial size is normal.   RIGHT ATRIUM  Right atrial size is normal. Injection of agitated saline showed no right-to-  left shunt.  -  AORTIC VALVE  There is trivial aortic valve thickening. There is no aortic stenosis. There  is no aortic regurgitation.  -  MITRAL VALVE  There is trivial mitral valve thickening. There is trace mitral regurgitation.   -  TRICUSPID VALVE  The tricuspid valve is normal in structure and function. No tricuspid  regurgitation.  -  PULMONIC VALVE  Structurally normal pulmonic valve. There is no pulmonic valvular  regurgitation. There is no pulmonic valvular stenosis.  -  ARTERIES  The aortic sinus is normal size.  -  VENOUS  IVC size was normal.  -  EFFUSION  There is no pericardial effusion. There is no pleural effusion. No cardiac  tamponade.   Chest CTA 09/22/2022 IMPRESSION:  1. No acute intracranial abnormality.  2. Approximately 75% stenosis of the right carotid bifurcation.  3. Approximately 55% stenosis of the distal left common carotid  artery.  4. Increased tortuosity of the V1 segments of the bilateral  vertebral arteries with associated approximately 40% stenosis of the  V1 segment of the right vertebral artery.   5. Intracranial atherosclerotic disease without hemodynamically  significant stenosis.  6. Aortic atherosclerosis.   Aortic Atherosclerosis (ICD10-I70.0).    Electronically Signed    By: Pedro Earls M.D.    On: 09/22/2022 11:06  CT chest lung cancer screening COMPARISON:  09/17/2020   FINDINGS: Cardiovascular: Heart size appears within normal limits. No pericardial effusion. Aortic atherosclerosis. Coronary artery calcifications.   Mediastinum/Nodes: No enlarged mediastinal, hilar, or axillary lymph nodes. Thyroid gland, trachea, and esophagus demonstrate no significant findings.   Lungs/Pleura: Mild centrilobular emphysema with diffuse bronchial wall thickening. No pleural effusion, airspace consolidation or atelectasis. Small calcified and noncalcified lung nodules are noted. The largest noncalcified nodule is in the subpleural right upper lobe with a mean derived diameter of 2.9 mm. No suspicious lung nodules identified at this time.   Upper Abdomen: No acute findings within the imaged portions of the upper abdomen.   Musculoskeletal: Mild thoracic spondylosis. No acute or suspicious osseous findings.   IMPRESSION: 1. Lung-RADS 2, benign appearance or behavior. Continue annual screening with low-dose chest CT without contrast in 12 months. 2. Coronary artery calcifications. 3.  Aortic Atherosclerosis (ICD10-I70.0) and Emphysema (ICD10-J43.9).     Electronically Signed   By: Kerby Moors M.D.   On: 10/02/2021 08:43     January 18, 2020 IMPRESSIONS   1. Left ventricular ejection fraction, by estimation, is 55 to 60%. The  left ventricle has normal function. The left ventricle has no regional  wall motion abnormalities. Left ventricular diastolic parameters are  consistent with Grade I diastolic  dysfunction (impaired relaxation).   2. Right ventricular systolic function is normal. The right ventricular  size is normal.   3. The mitral  valve is normal in structure and function. No evidence of  mitral valve regurgitation. No evidence of mitral stenosis.   4. The aortic valve is normal in structure and function. Aortic valve  regurgitation is not visualized. No aortic stenosis is present.   5. There is no obvious atheromatous plaque identified in the visible  portions of the ascending aorta or aortic arch.   6. The inferior vena cava is normal in size with greater than 50%  respiratory variability, suggesting right atrial pressure of 3 mmHg.   Comparison(s): No prior Echocardiogram.   FINDINGS   Left Ventricle: Left ventricular ejection fraction, by estimation, is 55  to 60%. The left ventricle has normal function. The left ventricle has no  regional wall motion abnormalities. The left ventricular internal cavity  size was normal in size. There is   no left ventricular hypertrophy. Left ventricular diastolic parameters  are consistent with Grade I diastolic dysfunction (impaired relaxation).  Normal left ventricular filling pressure.   Right Ventricle: The right ventricular size is normal. No increase in  right ventricular wall thickness. Right ventricular systolic function is  normal.   Left Atrium: Left atrial size was normal in size.   Right Atrium: Right atrial size was normal in size.   Pericardium: There is no evidence of pericardial effusion.   Mitral Valve: The mitral valve is normal in structure and function. Normal  mobility of the mitral valve leaflets. No evidence of mitral valve  regurgitation. No evidence of mitral valve stenosis.   Tricuspid Valve: The tricuspid valve is normal in structure. Tricuspid  valve regurgitation is not demonstrated. No evidence of tricuspid  stenosis.   Aortic Valve: The aortic valve is normal in structure and function. Aortic  valve regurgitation is not visualized. No aortic stenosis is present.   Pulmonic Valve: The pulmonic valve was normal in structure. Pulmonic  valve  regurgitation is not visualized. No evidence of pulmonic stenosis.   Aorta: There is no obvious atheromatous plaque identified in the visible  portions of the ascending aorta or aortic arch. The aortic root is normal  in size and structure and the aortic root, ascending aorta and aortic arch  are all structurally normal,  with no evidence of dilitation or obstruction.   Venous: The inferior vena cava is normal in size with greater than 50%  respiratory variability, suggesting right atrial pressure of 3 mmHg.   IAS/Shunts: No atrial level shunt detected by color flow Doppler.       Recent Labs: 12/29/2022: ALT 20; BUN 11; Creatinine, Ser 1.14; Hemoglobin 12.1; Platelets 238; Potassium 4.7; Sodium 132  Recent Lipid Panel    Component Value Date/Time   CHOL 242 (H) 09/17/2022 1101   TRIG 69 09/17/2022 1101   HDL 65 09/17/2022 1101   CHOLHDL 3.7 09/17/2022 1101   CHOLHDL 5.7 04/20/2015 1138   VLDL 34 04/20/2015 1138   LDLCALC 165 (H) 09/17/2022  1101   LDLDIRECT 246 (H) 10/31/2015 1444    Physical Exam:    VS:  BP 120/72   Pulse 79   Ht '5\' 1"'$  (1.549 m)   Wt 64.4 kg   SpO2 98%   BMI 26.83 kg/m     Wt Readings from Last 3 Encounters:  12/30/22 64.4 kg  12/29/22 65 kg  12/13/22 66 kg     GEN: Well nourished, well developed in no acute distress HEENT: Normal NECK: No JVD; No carotid bruits LYMPHATICS: No lymphadenopathy CARDIAC: S1S2 noted,RRR, no murmurs, rubs, gallops RESPIRATORY:  Clear to auscultation without rales, wheezing or rhonchi  ABDOMEN: Soft, non-tender, non-distended, +bowel sounds, no guarding. EXTREMITIES: No edema, No cyanosis, no clubbing MUSCULOSKELETAL:  No deformity  SKIN: Warm and dry NEUROLOGIC:  Alert and oriented x 3, non-focal PSYCHIATRIC:  Normal affect, good insight  ASSESSMENT:    1. Hyperlipidemia LDL goal <70   2. PAC (premature atrial contraction)   3. Coronary artery disease involving native coronary artery of native heart  without angina pectoris   4. PAT (paroxysmal atrial tachycardia)   5. Bilateral carotid artery stenosis    PLAN:     Recent low blood pressure - antihypertensive medication has been held by her provider. Ask her to continue to hold this medication and take her bp daily. She will send update in 2 weeks.   Recovering from recent carotid surgery.  Continue lipid lowering agent. Will get lipid panel.  The patient is in agreement with the above plan. The patient left the office in stable condition.  The patient will follow up in 6 months  or sooner if needed.   Medication Adjustments/Labs and Tests Ordered: Current medicines are reviewed at length with the patient today.  Concerns regarding medicines are outlined above.  Orders Placed This Encounter  Procedures   Lipid panel    No orders of the defined types were placed in this encounter.    Patient Instructions  Medication Instructions:  Your physician has recommended you make the following change in your medication:  STOP: Olmesartan due to recent systolic blood pressure under 100 Please take your blood pressure twice daily for 2 weeks and send in a MyChart message. Please include heart rates. If greater than 140/90 for 2 consecutive days please contact office.    HOW TO TAKE YOUR BLOOD PRESSURE: Rest 5 minutes before taking your blood pressure. Don't smoke or drink caffeinated beverages for at least 30 minutes before. Take your blood pressure before (not after) you eat. Sit comfortably with your back supported and both feet on the floor (don't cross your legs). Elevate your arm to heart level on a table or a desk. Use the proper sized cuff. It should fit smoothly and snugly around your bare upper arm. There should be enough room to slip a fingertip under the cuff. The bottom edge of the cuff should be 1 inch above the crease of the elbow. Ideally, take 3 measurements at one sitting and record the average.  *If you need a  refill on your cardiac medications before your next appointment, please call your pharmacy*   Lab Work: Your physician recommends that you return for lab work in [place]:  Lipids - please go fasting If you have labs (blood work) drawn today and your tests are completely normal, you will receive your results only by: [place] (if you have MyChart) OR A paper copy in the mail If you have any lab test that is abnormal  or we need to change your treatment, we will call you to review the results.   Testing/Procedures: None   Follow-Up: At [facility], you and your health needs are our priority.  As part of our continuing mission to provide you with exceptional heart care, we have created designated Provider Care Teams.  These Care Teams include your primary Cardiologist (physician) and Advanced Practice Providers (APPs -  Physician Assistants and Nurse Practitioners) who all work together to provide you with the care you need, when you need it.  We recommend signing up for the patient portal called "MyChart".  Sign up information is provided on this After Visit Summary.  MyChart is used to connect with patients for Virtual Visits (Telemedicine).  Patients are able to view lab/test results, encounter notes, upcoming appointments, etc.  Non-urgent messages can be sent to your provider as well.   To learn more about what you can do with MyChart, go to [email or web address].    Your next appointment:   6 month(s)  Provider:   Berniece Salines, DO        Adopting a Healthy Lifestyle.  Know what a healthy weight is for you (roughly BMI <25) and aim to maintain this   Aim for 7+ servings of fruits and vegetables daily   65-80+ fluid ounces of water or unsweet tea for healthy kidneys   Limit to max 1 drink of alcohol per day; avoid smoking/tobacco   Limit animal fats in diet for cholesterol and heart health - choose grass fed whenever available   Avoid highly processed  foods, and foods high in saturated/trans fats   Aim for low stress - take time to unwind and care for your mental health   Aim for 150 min of moderate intensity exercise weekly for heart health, and weights twice weekly for bone health   Aim for 7-9 hours of sleep daily   When it comes to diets, agreement about the perfect plan isnt easy to find, even among the experts. Experts at the [employer] developed an idea known as the Healthy Eating Plate. Just imagine a plate divided into logical, healthy portions.   The emphasis is on diet quality:   Load up on vegetables and fruits - one-half of your plate: Aim for color and variety, and remember that potatoes dont count.   Go for whole grains - one-quarter of your plate: Whole wheat, barley, wheat berries, quinoa, oats, brown rice, and foods made with them. If you want pasta, go with whole wheat pasta.   Protein power - one-quarter of your plate: Fish, chicken, beans, and nuts are all healthy, versatile protein sources. Limit red meat.   The diet, however, does go beyond the plate, offering a few other suggestions.   Use healthy plant oils, such as olive, canola, soy, corn, sunflower and peanut. Check the labels, and avoid partially hydrogenated oil, which have unhealthy trans fats.   If youre thirsty, drink water. Coffee and tea are good in moderation, but skip sugary drinks and limit milk and dairy products to one or two daily servings.   The type of carbohydrate in the diet is more important than the amount. Some sources of carbohydrates, such as vegetables, fruits, whole grains, and beans-are healthier than others.   Finally, stay active  Signed, Berniece Salines, DO  01/04/2023 6:02 PM    [facility]

## 2023-01-05 ENCOUNTER — Telehealth: Payer: Self-pay

## 2023-01-05 ENCOUNTER — Other Ambulatory Visit (HOSPITAL_COMMUNITY): Payer: Medicaid Other

## 2023-01-05 DIAGNOSIS — E785 Hyperlipidemia, unspecified: Secondary | ICD-10-CM | POA: Diagnosis not present

## 2023-01-05 NOTE — Telephone Encounter (Signed)
-----   Message from Willia Craze, NP sent at 01/05/2023  2:26 PM EST ----- I am adding my nurse Mickel Baas to this message to see when we can get her in for an appointment to further evaluate her symptoms.   Mickel Baas, please get patient an appointment with me. I doubt Dr. Loletha Carrow has anything any sooner than I do. Okay also to see another APP as she hasn't been recently seen by anyone.   Thanks  ----- Message ----- From: Ezequiel Essex, MD Sent: 01/02/2023   6:23 PM EST To: Willia Craze, NP; Alcus Dad, MD  Dr. Rosana Hoes and Ms. Chester Holstein - you are the most recent GI providers to care for this patient (summer 2020) PCP Dr. Rock Nephew - FYI  Bottom line: Could you please get her worked into your clinic schedule within the next week or so? She might need next EGD sooner than 2025 as originally planned.  [facility] working up this patient's RUQ pain. She has significant GI history, most importantly Barrett's esophagus. Work up thus far negative for acute cardiac, pancreatic, and hepatic causes. I would like her to see GI soon please to evaluate this pain from GI perspective. Given burning substernal and epigastric pain with drinking water, now wondering if esophageal or stomach etiology.   Thank you,  Ezequiel Essex, MD

## 2023-01-05 NOTE — Telephone Encounter (Signed)
Left messsage for pt to call back. Pt scheduled for soonest available appointment with Nevin Bloodgood. Pt scheduled for 01/28/23 at 1:30 pm

## 2023-01-06 ENCOUNTER — Other Ambulatory Visit: Payer: Medicaid Other | Admitting: Obstetrics and Gynecology

## 2023-01-06 ENCOUNTER — Encounter: Payer: Self-pay | Admitting: Obstetrics and Gynecology

## 2023-01-06 LAB — LIPID PANEL
Chol/HDL Ratio: 2.9 ratio (ref 0.0–4.4)
Cholesterol, Total: 183 mg/dL (ref 100–199)
HDL: 64 mg/dL (ref >39)
LDL Chol Calc (NIH): 104 mg/dL — ABNORMAL HIGH (ref 0–99)
Triglycerides: 83 mg/dL (ref 0–149)
VLDL Cholesterol Cal: 15 mg/dL (ref 5–40)

## 2023-01-06 NOTE — Patient Outreach (Signed)
Care Coordination  01/06/2023  DASHAYLA THEISSEN 11-19-1956 [id number]  RNCM called at scheduled time.  Phone was answered and RNCM was asked to call back in 5 minutes.  RNCM called back and no answer, call went to voicemail, message left.  [name], [name] [place]  [facility] - Managed Medicaid High Risk [phone]

## 2023-01-07 ENCOUNTER — Ambulatory Visit (HOSPITAL_COMMUNITY): Payer: Medicaid Other

## 2023-01-07 NOTE — Telephone Encounter (Signed)
Patient had not reviewed mychart message. I called her to review recommendations and confirm she can make the f/u apt 2/26. Pt appreciative of the call and will come to [facility] office for pharmD visit 2/26 @ 2:00

## 2023-01-14 NOTE — Telephone Encounter (Signed)
Let pt know about appointment. Pt verbalized understanding and had no other concerns at end of call.

## 2023-01-15 DIAGNOSIS — R413 Other amnesia: Secondary | ICD-10-CM | POA: Diagnosis not present

## 2023-01-15 DIAGNOSIS — K5903 Drug induced constipation: Secondary | ICD-10-CM | POA: Diagnosis not present

## 2023-01-15 DIAGNOSIS — Z79891 Long term (current) use of opiate analgesic: Secondary | ICD-10-CM | POA: Diagnosis not present

## 2023-01-15 DIAGNOSIS — G894 Chronic pain syndrome: Secondary | ICD-10-CM | POA: Diagnosis not present

## 2023-01-15 DIAGNOSIS — M5416 Radiculopathy, lumbar region: Secondary | ICD-10-CM | POA: Diagnosis not present

## 2023-01-20 ENCOUNTER — Ambulatory Visit: Payer: Medicaid Other | Admitting: Family Medicine

## 2023-01-20 ENCOUNTER — Encounter: Payer: Self-pay | Admitting: Family Medicine

## 2023-01-20 VITALS — BP 135/64 | HR 69 | Ht 61.0 in | Wt 143.2 lb

## 2023-01-20 DIAGNOSIS — R55 Syncope and collapse: Secondary | ICD-10-CM

## 2023-01-20 DIAGNOSIS — I10 Essential (primary) hypertension: Secondary | ICD-10-CM | POA: Diagnosis not present

## 2023-01-20 NOTE — Progress Notes (Unsigned)
    SUBJECTIVE:   CHIEF COMPLAINT / HPI:   Syncope f/u -Started having syncopal episodes in October 2023 -Had R carotid endarterectomy 10/27/22 -Was admitted to [facility] in December 2023 after syncopal episode and had extensive workup at that time (echo, zio patch, stress test, EEG, carotid studies) which was unrevealing -Felt to be vasovagal type episodes -Did well for about 7 weeks without syncopal episode but then it recurred on 01/03/23 -Witnessed by spouse -Wasn't feeling well that morning, was having a lot of severe back pain -Went on hydro-bed at [facility] [employer] for her back -Afterwards when getting in car had 3 back-to-back syncopal episodes where she slumped over -Could feel it coming on but couldn't prevent it -Vomited once -No chest pain or palpitations   HTN -had some low BP readings -Olmesartan was held, advised home BP monitoring -Subsequently had elevated BP, resumed olmesartan at lower dose per recommendations from cardiology office -seeing Dr Harriet Masson next week (2/26)   PERTINENT  PMH / PSH: HLD, chronic back pain, HLD, Barrett's esophagus  OBJECTIVE:   BP 135/64   Pulse 69   Ht 5' 1"$  (1.549 m)   Wt 143 lb 3.2 oz (65 kg)   SpO2 100%   BMI 27.06 kg/m   Gen: NAD, pleasant, able to participate in exam CV: RRR, normal S1/S2, no murmur Resp: Normal effort, lungs CTAB Extremities: no edema or cyanosis Skin: warm and dry, no rashes noted Neuro: alert, CN II-XII intact, strength and sensation grossly intact in all extremities, normal gait Psych: Normal affect and mood   ASSESSMENT/PLAN:   Syncope Multiple episodes over past 4 months. Fortunately has not sustained any significant injury. Still favor vasovagal etiology as she has had thorough workup that was negative for other causes. Prodromal symptoms also support vasovagal cause. Reviewed medications- taking chronic opioids (now on hydrocodone instead of tylenol #3 per pain doctor) which could contribute through  BP effects but this is felt to be less likely as she has taken these for a long time without issue. We again discussed strategies to stave off episode when she notices pre-syncopal sensation. Advised continued follow-up with cardiology and neurology. Follow up in 2 months.  Hypertension, essential, benign Now on lower dose of Olmesartan. This was resumed after her most recent syncopal episode so doubt this played a role. Majority of home readings remain above goal. Will defer further management to cardiology, as she has upcoming appointment in few days.   Alcus Dad, MD [place]

## 2023-01-20 NOTE — Patient Instructions (Signed)
It was great to see you!  We will not make any medication changes today. If you notice vision changes, feeling hot, any sensation that you may pass out-- lay down right away, make sure you're not alone, place a cold towel or ice on your head/neck, drink some water and eat a snack if you're able to do so safely.  See your cardiologist as planned on 2/26.  Follow up with me in 2 months.  Take care, Dr Rock Nephew

## 2023-01-22 NOTE — Assessment & Plan Note (Addendum)
Now on lower dose of Olmesartan. This was resumed after her most recent syncopal episode so doubt this played a role. Majority of home readings remain above goal. Will defer further management to cardiology, as she has upcoming appointment in few days.

## 2023-01-22 NOTE — Assessment & Plan Note (Addendum)
Multiple episodes over past 4 months. Fortunately has not sustained any significant injury. Still favor vasovagal etiology as she has had thorough workup that was negative for other causes. Prodromal symptoms also support vasovagal cause. Reviewed medications- taking chronic opioids (now on hydrocodone instead of tylenol #3 per pain doctor) which could contribute through BP effects but this is felt to be less likely as she has taken these for a long time without issue. We again discussed strategies to stave off episode when she notices pre-syncopal sensation. Advised continued follow-up with cardiology and neurology. Follow up in 2 months.

## 2023-01-26 ENCOUNTER — Ambulatory Visit: Payer: Medicaid Other | Attending: Cardiology | Admitting: Pharmacist

## 2023-01-26 ENCOUNTER — Encounter: Payer: Self-pay | Admitting: Pharmacist

## 2023-01-26 VITALS — BP 129/84 | HR 79

## 2023-01-26 DIAGNOSIS — I10 Essential (primary) hypertension: Secondary | ICD-10-CM | POA: Diagnosis not present

## 2023-01-26 MED ORDER — AMLODIPINE BESYLATE 2.5 MG PO TABS: 2.5000 mg | ORAL_TABLET | Freq: Every day | ORAL | 5 refills | Status: DC

## 2023-01-26 NOTE — Progress Notes (Unsigned)
Patient ID: Diana [name]                 DOB: 1956-10-14                      MRN: [id number]     HPI: Diana [name] is a 67 y.o. female referred by Diana [name] to HTN clinic. PMH is significant for HTN, COPD, CAD, CKD, and history of smoking. Had right carotid endarterectomy on 10/27/22. Has been having syncopal episodes and has upcoming neurology appointment.  Patient sent myChart messages on 2/1 with concerns over elevated BP and headache. She as instrcuted to restart olmesartan at '10mg'$  once daily.  Patient presents today with BP log. BP has begun to trend down in last week from 150s-160s. Occasional readings in low 100s. Reports occasionally feeling dizzy/lightheaded.  Current HTN meds: Olmesartan '10mg'$    BP goal: <130/80   Wt Readings from Last 3 Encounters:  01/20/23 143 lb 3.2 oz (65 kg)  12/30/22 142 lb (64.4 kg)  12/29/22 143 lb 6.4 oz (65 kg)   BP Readings from Last 3 Encounters:  01/20/23 135/64  12/30/22 120/72  12/29/22 (!) 82/53   Pulse Readings from Last 3 Encounters:  01/20/23 69  12/30/22 79  12/29/22 85    Renal function: CrCl cannot be calculated (Patient's most recent lab result is older than the maximum 21 days allowed.).  Past Medical History:  Diagnosis Date   Acute gout involving toe of left foot 10/12/2019   Anxiety    Asthma    Barrett esophagus    "don't know that I've ever been stretched" (08/17/2013)   Bilateral leg edema 07/16/2020   Choledocholithiasis with acute cholecystitis    Chronic lower back pain    COPD (chronic obstructive pulmonary disease) ([place])    Craniosynostosis    congenital   DDD (degenerative disc disease)    Family history of anesthesia complication    "PONV; both parents" (08/17/2013)   GERD (gastroesophageal reflux disease)    H/O hiatal hernia    H/O: hysterectomy 09/22/2014   By her report at age 72. Has never been on Norma replacement therapy. 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 Switched to lovastatin b/c its $4.     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 We'll try adding estradiol cream 3 times a week. I would give this for 8 weeks and she's not having some improvement, I would have her return to clinic. If she is having improvement she can decrease the frequency of her applications to once or twice a week. If she's not had improvement then I would consider increasing him to 7 days a week. 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 1 each 0   Alirocumab (PRALUENT) 150 MG/ML SOAJ Inject 150 mg into the skin every 14 (fourteen) days. 6 mL 1   allopurinol (ZYLOPRIM) 100 MG tablet Take 1 tablet (100 mg total) by mouth daily. 90 tablet 2   AMITIZA 24 MCG capsule Take 24 mcg by mouth daily.     aspirin EC 325 MG tablet Take 325 mg by mouth daily.     aspirin-acetaminophen-caffeine (EXCEDRIN MIGRAINE) 250-250-65 MG tablet Take 2 tablets by mouth every 6 (six) hours as needed for headache.     azelastine (ASTELIN) 0.1 % nasal spray Place into the nose in the morning and at  bedtime.     azithromycin (ZITHROMAX Z-PAK) 250 MG tablet Take 2 tablets on day 1 and then follow with 1 tablet daily for 4 more days 6 each 0   clopidogrel (PLAVIX) 75 MG tablet Take 75 mg by mouth daily.     co-enzyme Q-10 30 MG capsule Take 100 mg by mouth daily.     Cyanocobalamin (B-12) 3000 MCG CAPS Take 1 capsule by mouth daily.     diclofenac sodium (VOLTAREN) 1 % GEL Apply 2 g topically 4 (four) times daily as needed. 100 g 5   esomeprazole (NEXIUM) 40 MG capsule TAKE 1 CAPSULE BY MOUTH DAILY AT 12 NOON 90 capsule 1   famotidine (PEPCID) 20 MG tablet One after supper 30 tablet 11   lidocaine (HM LIDOCAINE PATCH) 4 % Place 1 patch onto the skin daily. 30 patch 2   linaclotide (LINZESS) 145 MCG CAPS capsule TAKE 1 CAPSULE BY MOUTH DAILY BEFORE BREAKFAST 30 capsule 5   naloxone (NARCAN) nasal spray 4 mg/0.1 mL Place into the nose as needed. overdose     olmesartan (BENICAR) 20 MG tablet Take 0.5 tablets (10 mg total) by mouth daily. 45 tablet 3   Pitavastatin Calcium 1 MG TABS Take 1 tablet (1 mg total) by mouth 2 (two) times a week. 30 tablet 6   SUMAtriptan (IMITREX) 50 MG tablet Take 1 tablet (50 mg total) by mouth every 2 (two) hours as needed for migraine. May repeat in 2 hours if headache persists or recurs. 10 tablet 3   Turmeric 500 MG CAPS Take 2 tablets by mouth daily.     venlafaxine XR (EFFEXOR-XR) 37.5 MG 24 hr capsule Take 1 capsule (37.5 mg total) by mouth daily with breakfast. 30 capsule 2   No current facility-administered medications on file prior to visit.    Allergies  Allergen Reactions   Penicillins Anaphylaxis   Demerol [Meperidine] Nausea And Vomiting   Doxycycline Hyclate Nausea And Vomiting   Gabapentin Other (See Comments)    Malaise   Sulfa Antibiotics Hives   Atorvastatin     myalgias   Fluvastatin     myalgia   Levaquin [Levofloxacin]     Reported some possible facial swelling with administration 05/2018   Lovastatin     myalgia   No Known  Allergies     Other reaction(s): Dizziness   Pitavastatin     myalgia   Pravastatin     myalgia   Rosuvastatin     myalgia   Simvastatin     myalgia   Benadryl [Diphenhydramine Hcl] Palpitations and Rash   Buprenorphine  Hcl Nausea And Vomiting   Cephalexin Rash   Morphine And Related Nausea And Vomiting   Spiriva Handihaler [Tiotropium Bromide Monohydrate] Rash     Assessment/Plan:  1. Hypertension -  Patient BP in room 129/84 which is slightly above goal of <130/80. Patient tolerating olmesartan although may be too strong for her. Due to occasional dizziness and hypotensive readings, will d/c olmesartan at this time and start low dose amlodipine. Advised to start at 2.'5mg'$  daily and if BP begins to trend >140 will increase to '5mg'$ . Patient voiced understanding.  Stop olmesartan '10mg'$  daily Start amlodipine 2.'5mg'$  daily  Karren Cobble, PharmD, [place], [place], [place], [place], [place], [address] Phone: [phone], Fax: [phone]

## 2023-01-26 NOTE — Patient Instructions (Signed)
It was good seeing you two today  We would like your blood pressure to be less than 130/80  Please discontinue your olmesartan at this time  I would like to start a different medication called amlodipine 2.'5mg'$ .  Continue to check your blood pressure at home and if it stays greater than 140, we can increase to '5mg'$  daily  Please call or send a message with any questions  Karren Cobble, PharmD, [place], [place], [place], [place], [place], [address] Phone: [phone], Fax: [phone]

## 2023-01-28 ENCOUNTER — Ambulatory Visit: Payer: Medicaid Other | Admitting: Nurse Practitioner

## 2023-02-02 ENCOUNTER — Ambulatory Visit: Payer: Medicaid Other | Admitting: Gastroenterology

## 2023-02-02 ENCOUNTER — Encounter: Payer: Self-pay | Admitting: Gastroenterology

## 2023-02-02 VITALS — BP 164/82 | HR 76 | Ht 61.0 in | Wt 143.0 lb

## 2023-02-02 DIAGNOSIS — R1031 Right lower quadrant pain: Secondary | ICD-10-CM | POA: Diagnosis not present

## 2023-02-02 DIAGNOSIS — R1013 Epigastric pain: Secondary | ICD-10-CM

## 2023-02-02 NOTE — Progress Notes (Signed)
[facility] Consult Note:  History: Diana [name] 02/02/2023  Referring provider: Alcus Dad, MD  Reason for consult/chief complaint:  Chief Complaint  Patient presents with   Abdominal Pain    RUQ and LUQ pain   Constipation    Controlled with Linzess    Emesis    Episode that occurred with fainting.      Subjective  HPI: Summary of pertinent GI issues: Chronic constipation, severe diverticulosis.  Last colonoscopy by Dr. Loletha Carrow December 2019 removed for diminutive adenomatous polyps.  3-year recall recommended (changed to 5 years based on updated guidelines).  No polyps on colonoscopy by Dr. Deatra Ina May 2015. Nondysplastic Barrett's esophagus (short segment, 2 cm) last evaluated by Dr. Loletha Carrow August 2020.  Endoscopically unchanged from prior exam by Dr. Deatra Ina in April 2015 _______________________________  Note from referring medical physician last month (Dr Ezequiel Essex): "[facility] and have been working up this patient's RUQ pain. She has significant GI history, most importantly Barrett's esophagus. Work up thus far negative for acute cardiac, pancreatic, and hepatic causes. I would like her to see GI soon please to evaluate this pain from GI perspective. Given burning substernal and epigastric pain with drinking water, now wondering if esophageal or stomach etiology. " From that provider's 12/29/22 note:"Diana [name] is a 67 year old woman who presents today with her ex-husband for concern of right flank pain.  Onset 6 AM this morning.  Intermittent episodes lasting 2 to 3 minutes each.  Patient reports sharp pain that "moves around a little bit" but has no radiation.  Sometimes occurs in RUQ and midclavicular line, sometimes right lateral ribs.  Patient cannot identify any patterns including onset with certain food or drink, limiting or inciting movements, etc.  Last BM 2 to 3 days ago.  Constipation is a chronic problem, she currently takes  Linzess and Amitiza.  She specifies this RUQ and flank pain feels different than her normal constipation discomfort. "  The patient was also hypertensive that day reporting dizziness and was advised to stop telmisartan.  Kimyetta saw her cardiologist January 30 with extensive cardiovascular history outlined in that note.  She had had a carotid endarterectomy November 2023 Saw Cardiology again 01/26/23 with low BP readings and dizziness - further BP medicine changes and awaiting neurology follow up.   She is accompanied by her husband for today's visit   Today, she complains of a burning epigastric pain and RLQ pain. She states that her pain started almost a year ago. She states her pain is periodically and typically experiences it 2-3x a week.  It may occur in the lower chest/epigastrium or perhaps in the right lower quadrant, sometimes radiating between those 2 areas.  At times she has similar pain in the left lower chest wall.  So she describes her pain being similar to heartburn (burning "). She denies any SOB.  She has chronic constipation reasonably well-managed with medications.  Her husband reports that she's unable to eat in the morning for few hours. Once she does eat, she can get to a certain point, after which she becomes nauseated and full.  Her husband reports that she frequently experiences pain beneath her breastbones; however, she doesn't believe her current pain is related to the pain she feels under her breastbone.     She reports  having episodes of dizziness and fainting.   She reports being on Plavix 75 mg.  ROS:  Review of Systems  Constitutional:  Negative for appetite change and fever.  HENT:  Negative for trouble swallowing.   Respiratory:  Negative for cough and shortness of breath.   Cardiovascular:  Negative for chest pain.  Gastrointestinal:  Positive for abdominal pain, nausea and vomiting. Negative for abdominal distention, anal bleeding, blood in stool,  constipation, diarrhea and rectal pain.  Genitourinary:  Negative for dysuria.  Musculoskeletal:  Negative for back pain.  Skin:  Negative for rash.  Neurological:  Positive for dizziness. Negative for weakness.  All other systems reviewed and are negative.    Past Medical History: Past Medical History:  Diagnosis Date   Acute gout involving toe of left foot 10/12/2019   Anxiety    Asthma    Barrett esophagus    "don't know that I've ever been stretched" (08/17/2013)   Bilateral leg edema 07/16/2020   Choledocholithiasis with acute cholecystitis    Chronic lower back pain    COPD (chronic obstructive pulmonary disease) ([place])    Craniosynostosis    congenital   DDD (degenerative disc disease)    Family history of anesthesia complication    "PONV; both parents" (08/17/2013)   GERD (gastroesophageal reflux disease)    H/O hiatal hernia    H/O: hysterectomy 09/22/2014   By her report at age 44. Has never been on Norma replacement therapy. 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 Switched to lovastatin b/c its $4.     Hypertension    Hyponatremia 08/28/2020   IBS (irritable bowel syndrome)    Kidney stones 07/22/2017   Migraines    "since age 89; probably twice/month" (08/17/2013)   PONV (postoperative nausea and vomiting)    Postmenopausal atrophic vaginitis 09/22/2014   History of hysterectomy. We'll try adding estradiol cream 3 times a week. I would give this for 8 weeks and she's not having some improvement, I would have her return to clinic. If she is having improvement she can decrease the frequency of her applications to once or twice a week. If she's not had improvement then I would consider increasing him to 7 days a week. 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   Shingles    Sinus pause 02/10/2020   Sleep apnea-like behavior 03/20/2020   Takes dietary supplements 07/28/2020     Past Surgical History: Past Surgical History:  Procedure Laterality Date   ABDOMINAL HYSTERECTOMY  1992   Total for chronic pelvic pain    APPENDECTOMY     BACK SURGERY  1990's   L4-L5 fusion @ [facility]  05/28/2019   fused C4-5-6, done in [place]  2007   Performed in [place]   "1st time got infected; 2nd time didn't take; fix the 3rd time" (08/17/2013)   POSTERIOR LUMBAR FUSION  1990's   "took piece off my hip" (08/17/2013)   TOTAL HIP ARTHROPLASTY Right 12/31/04   AVN after fall injury; performed by Ralene Cork; DePuy S-ROM total hip (metal on metal)   UPPER GASTROINTESTINAL ENDOSCOPY  Family History: Family History  Problem Relation Age of Onset   Hypertension Mother    Hypertension Father    Skin cancer Father    Breast cancer Maternal Grandmother    Colon cancer Paternal Grandfather    Rectal cancer Neg Hx    Stomach cancer Neg Hx    Esophageal cancer Neg Hx     Social History: Social History   Socioeconomic History   Marital status: Divorced    Spouse name: Not on file   Number of children: 2   Years of education: Not on file   Highest education level: Not on file  Occupational History   Occupation: retired  Tobacco Use   Smoking status: Former    Packs/day: 0.50    Years: 32.00    Total pack years: 16.00    Types: Cigarettes    Quit date: 05/30/2022    Years since quitting: 0.6    Passive exposure: Past   Smokeless tobacco: Never   Tobacco comments:    on chanix  Vaping Use   Vaping Use: Every day  Substance and Sexual Activity   Alcohol use: Yes    Alcohol/week: 0.0  standard drinks of alcohol    Comment: occasionally   Drug use: No   Sexual activity: Not Currently  Other Topics Concern   Not on file  Social History Narrative   Not on file   Social Determinants of Health   Financial Resource Strain: Not on file  Food Insecurity: No Food Insecurity (01/06/2023)   Hunger Vital Sign    Worried About Running Out of Food in the Last Year: Never true    Ran Out of Food in the Last Year: Never true  Transportation Needs: No Transportation Needs (12/10/2022)   PRAPARE - Hydrologist (Medical): No    Lack of Transportation (Non-Medical): No  Physical Activity: Not on file  Stress: Not on file  Social Connections: Not on file    Allergies: Allergies  Allergen Reactions   Penicillins Anaphylaxis   Demerol [Meperidine] Nausea And Vomiting   Doxycycline Hyclate Nausea And Vomiting   Gabapentin Other (See Comments)    Malaise   Sulfa Antibiotics Hives   Atorvastatin     myalgias   Fluvastatin     myalgia   Levaquin [Levofloxacin]     Reported some possible facial swelling with administration 05/2018   Lovastatin     myalgia   No Known Allergies     Other reaction(s): Dizziness   Pitavastatin     myalgia   Pravastatin     myalgia   Rosuvastatin     myalgia   Simvastatin     myalgia   Benadryl [Diphenhydramine Hcl] Palpitations and Rash   Buprenorphine Hcl Nausea And Vomiting   Cephalexin Rash   Morphine And Related Nausea And Vomiting   Spiriva Handihaler [Tiotropium Bromide Monohydrate] Rash    Outpatient Meds: Current Outpatient Medications  Medication Sig Dispense Refill   albuterol (VENTOLIN HFA) 108 (90 Base) MCG/ACT inhaler Inhale 2 puffs into the lungs every 4 (four) hours as needed for wheezing or shortness of breath. 1 each 0   Alirocumab (PRALUENT) 150 MG/ML SOAJ Inject 150 mg into the skin every 14 (fourteen) days. 6 mL 1   allopurinol (ZYLOPRIM) 100 MG tablet Take 1 tablet (100 mg total) by  mouth daily. 90 tablet 2   AMITIZA 24 MCG capsule Take 24 mcg by mouth daily.     amLODipine (  NORVASC) 2.5 MG tablet Take 1 tablet (2.5 mg total) by mouth daily. 30 tablet 5   aspirin EC 325 MG tablet Take 325 mg by mouth daily.     aspirin-acetaminophen-caffeine (EXCEDRIN MIGRAINE) 250-250-65 MG tablet Take 2 tablets by mouth every 6 (six) hours as needed for headache.     azelastine (ASTELIN) 0.1 % nasal spray Place into the nose in the morning and at bedtime.     azithromycin (ZITHROMAX Z-PAK) 250 MG tablet Take 2 tablets on day 1 and then follow with 1 tablet daily for 4 more days 6 each 0   clopidogrel (PLAVIX) 75 MG tablet Take 75 mg by mouth daily.     co-enzyme Q-10 30 MG capsule Take 100 mg by mouth daily.     Cyanocobalamin (B-12) 3000 MCG CAPS Take 1 capsule by mouth daily.     diclofenac sodium (VOLTAREN) 1 % GEL Apply 2 g topically 4 (four) times daily as needed. 100 g 5   esomeprazole (NEXIUM) 40 MG capsule TAKE 1 CAPSULE BY MOUTH DAILY AT 12 NOON 90 capsule 1   famotidine (PEPCID) 20 MG tablet One after supper 30 tablet 11   HYDROcodone-acetaminophen (NORCO/VICODIN) 5-325 MG tablet Take 1 tablet by mouth every 8 (eight) hours as needed.     lidocaine (HM LIDOCAINE PATCH) 4 % Place 1 patch onto the skin daily. 30 patch 2   linaclotide (LINZESS) 145 MCG CAPS capsule TAKE 1 CAPSULE BY MOUTH DAILY BEFORE BREAKFAST 30 capsule 5   naloxone (NARCAN) nasal spray 4 mg/0.1 mL Place into the nose as needed. overdose     Pitavastatin Calcium 1 MG TABS Take 1 tablet (1 mg total) by mouth 2 (two) times a week. 30 tablet 6   SUMAtriptan (IMITREX) 50 MG tablet Take 1 tablet (50 mg total) by mouth every 2 (two) hours as needed for migraine. May repeat in 2 hours if headache persists or recurs. 10 tablet 3   Turmeric 500 MG CAPS Take 2 tablets by mouth daily.     venlafaxine XR (EFFEXOR-XR) 37.5 MG 24 hr capsule Take 1 capsule (37.5 mg total) by mouth daily with breakfast. 30 capsule 2   No  current facility-administered medications for this visit.      ___________________________________________________________________ Objective   Exam:  BP (!) 164/82 (BP Location: Left Arm, Patient Position: Sitting, Cuff Size: Normal)   Pulse 76   Ht '5\' 1"'$  (1.549 m)   Wt 143 lb (64.9 kg)   BMI 27.02 kg/m  Wt Readings from Last 3 Encounters:  02/02/23 143 lb (64.9 kg)  01/20/23 143 lb 3.2 oz (65 kg)  12/30/22 142 lb (64.4 kg)    General: well appearing Eyes: sclera anicteric, no redness ENT: oral mucosa moist without lesions, no cervical or supraclavicular lymphadenopathy CV: RRR, no JVD, no peripheral edema Resp: clear to auscultation bilaterally, normal RR and effort noted GI: soft, mild RLQ tenderness, with active bowel sounds. No guarding or palpable organomegaly noted. Skin; warm and dry, no rash or jaundice noted Neuro: awake, alert and oriented x 3. Normal gross motor function and fluent speech  Labs:     Latest Ref Rng & Units 12/29/2022    4:56 PM 07/13/2020    2:43 PM 01/13/2020   10:12 AM  CBC  WBC 3.4 - 10.8 x10E3/uL 13.5  7.1  6.3   Hemoglobin 11.1 - 15.9 g/dL 12.1  13.4  12.8   Hematocrit 34.0 - 46.6 % 35.6  41.7  38.2  Platelets 150 - 450 x10E3/uL 238  236        Latest Ref Rng & Units 12/29/2022    4:56 PM 08/23/2021    8:50 AM 05/06/2021    9:41 AM  CMP  Glucose 70 - 99 mg/dL 90     BUN 8 - 27 mg/dL 11     Creatinine 0.57 - 1.00 mg/dL 1.14     Sodium 134 - 144 mmol/L 132     Potassium 3.5 - 5.2 mmol/L 4.7     Chloride 96 - 106 mmol/L 94     CO2 20 - 29 mmol/L 21     Calcium 8.7 - 10.3 mg/dL 9.3     Total Protein 6.0 - 8.5 g/dL 6.6  6.2  6.7   Total Bilirubin 0.0 - 1.2 mg/dL 0.6  0.4  0.4   Alkaline Phos 44 - 121 IU/L 103  107  138   AST 0 - 40 IU/L '23  20  20   '$ ALT 0 - 32 IU/L '20  15  15      '$ Radiologic Studies:  CLINICAL DATA:  RUQ pain   EXAM: ULTRASOUND ABDOMEN LIMITED RIGHT UPPER QUADRANT   COMPARISON:  July 30, 2004    FINDINGS: Gallbladder:   Surgically absent.   Common bile duct:   Diameter: Visualized portion measures 5 mm, within normal limits.   Liver:   No focal lesion identified. Coarsened hepatic parenchymal echogenicity. Portal vein is patent on color Doppler imaging with normal direction of blood flow towards the liver.   Other: None.   IMPRESSION: Coarsened hepatic parenchymal echogenicity. This is a nonspecific finding but can be seen in the setting of hepatic steatosis or underlying hepatocellular disease. Recommend correlation with LFTs. Abdominal ultrasound with elastography could be utilized if concern for underlying cirrhosis.     Electronically Signed   By: Valentino Saxon M.D.   On: 01/02/2023 08:04  Assessment: Epigastric Abdominal Pain   RLQ Pain   It was challenging to characterize these pain complaints, as they are in various locations with inconsistent timing and modifying factors.  Although it is a burning quality, this is not typical for any specific digestive condition.  Despite the being a burning quality and the pain sometimes located in the upper abdomen, the remainder of that is not typical for reflux or ulcer. As such, I think an upper endoscopy would be low yield for her, and given the increased risk due to her medical issues, the risk-benefit ratio does not favor doing that.  I suspect this may be a musculoskeletal pain issue, something which often has a burning quality to the pain. Plan:  CT Abdomen and Pelvis with Contrast.  Only if there are specific abnormalities that might suggest need for endoscopic evaluation with such further testing be planned.  Thank you for the courtesy of this consult.  Please call me with any questions or concerns.   - Wilfrid Lund, MD    [facility]   [place] as a scribe for [place], MD.,have documented all relevant documentation on the behalf of Doran Stabler, MD,as directed by  Doran Stabler, MD while in the presence of Doran Stabler, MD.   Salina April III, MD, have reviewed all documentation for this visit. The documentation on 02/02/23 for the exam, diagnosis, procedures, and orders are all accurate and complete.    40 minutes were spent on this encounter (including  chart review, history/exam, counseling/coordination of care, and documentation) > 50% of that time was spent on counseling and coordination of care.  CC: Referring provider noted above

## 2023-02-02 NOTE — Patient Instructions (Signed)
You have been scheduled for a CT scan of the abdomen and pelvis at [facility] (1st floor of hospital).   You are scheduled on February 19, 2023 at 3:00pm. You should arrive at 12:45am to drink your contrast. Please follow the written instructions below on the day of your exam:  WARNING: IF YOU ARE ALLERGIC TO IODINE/X-RAY DYE, PLEASE NOTIFY RADIOLOGY IMMEDIATELY AT [id number]! YOU WILL BE GIVEN A 13 HOUR PREMEDICATION PREP.  1) Do not eat or drink anything after 11:00am (4 hours prior to your test)  You may take any medications as prescribed with a small amount of water, if necessary. If you take any of the following medications: METFORMIN, GLUCOPHAGE, GLUCOVANCE, AVANDAMET, RIOMET, FORTAMET, Learned MET, JANUMET, GLUMETZA or METAGLIP, you MAY be asked to HOLD this medication 48 hours AFTER the exam.  The purpose of you drinking the oral contrast is to aid in the visualization of your intestinal tract. The contrast solution may cause some diarrhea. Depending on your individual set of symptoms, you may also receive an intravenous injection of x-ray contrast/dye. Plan on being at [facility] for 30 minutes or longer, depending on the type of exam you are having performed.  This test typically takes 30-45 minutes to complete.  If you have any questions regarding your exam or if you need to reschedule, you may call the CT department at [phone] between the hours of 8:00 am and 5:00 pm, Monday-Friday.  ________________________________________________________   _______________________________________________________  If your blood pressure at your visit was 140/90 or greater, please contact your primary care physician to follow up on this.  _______________________________________________________  If you are age 67 or older, your body mass index should be between 23-30. Your Body mass index is 27.02 kg/m. If this is out of the aforementioned range listed, please consider follow up  with your Primary Care Provider.  If you are age 19 or younger, your body mass index should be between 19-25. Your Body mass index is 27.02 kg/m. If this is out of the aformentioned range listed, please consider follow up with your Primary Care Provider.   ________________________________________________________  The [facility] providers would like to encourage you to use [facility] to communicate with providers for non-urgent requests or questions.  Due to long hold times on the telephone, sending your provider a message by [facility] may be a faster and more efficient way to get a response.  Please allow 48 business hours for a response.  Please remember that this is for non-urgent requests.  _______________________________________________________ It was a pleasure to see you today!  Thank you for trusting me with your gastrointestinal care!

## 2023-02-02 NOTE — Progress Notes (Deleted)
[name] Consult Note:  History: Diana [name] 02/02/2023  Referring provider: Alcus Dad, MD  Reason for consult/chief complaint: No chief complaint on file.   Subjective  HPI: Summary of pertinent GI issues: Chronic constipation, severe diverticulosis.  Last colonoscopy by Dr. Loletha Carrow December 2019 removed for diminutive adenomatous polyps.  3-year recall recommended (changed to 5 years based on updated guidelines).  No polyps on colonoscopy by Dr. Deatra Ina May 2015. Nondysplastic Barrett's esophagus (short segment, 2 cm) last evaluated by Dr. Loletha Carrow August 2020.  Endoscopically unchanged from prior exam by Dr. Deatra Ina in April 2015 _______________________________  Note from referring medical physician last month (Dr Ezequiel Essex): "[facility] and have been working up this patient's RUQ pain. She has significant GI history, most importantly Barrett's esophagus. Work up thus far negative for acute cardiac, pancreatic, and hepatic causes. I would like her to see GI soon please to evaluate this pain from GI perspective. Given burning substernal and epigastric pain with drinking water, now wondering if esophageal or stomach etiology. " From that provider's 12/29/22 note:"Ms. Diana [name] is a 67 year old woman who presents today with her ex-husband for concern of right flank pain.  Onset 6 AM this morning.  Intermittent episodes lasting 2 to 3 minutes each.  Patient reports sharp pain that "moves around a little bit" but has no radiation.  Sometimes occurs in RUQ and midclavicular line, sometimes right lateral ribs.  Patient cannot identify any patterns including onset with certain food or drink, limiting or inciting movements, etc.  Last BM 2 to 3 days ago.  Constipation is a chronic problem, she currently takes Linzess and Amitiza.  She specifies this RUQ and flank pain feels different than her normal constipation discomfort. "  The patient was also  hypertensive that day reporting dizziness and was advised to stop telmisartan.  Jimisha saw her cardiologist January 30 with extensive cardiovascular history outlined in that note.  She had had a carotid endarterectomy November 2023.  Cardiac CT done. (Report on file).   Cardiac CT 11/19/22 - cardiologist recommended medical management of calcified CAD.  Saw Cardiology again (pharmacist) 01/26/23 with low BP readings and dizziness - further BP medicine changes and awaiting neurology follow up.  ***   ROS:  Review of Systems   Past Medical History: Past Medical History:  Diagnosis Date   Acute gout involving toe of left foot 10/12/2019   Anxiety    Asthma    Barrett esophagus    "don't know that I've ever been stretched" (08/17/2013)   Bilateral leg edema 07/16/2020   Choledocholithiasis with acute cholecystitis    Chronic lower back pain    COPD (chronic obstructive pulmonary disease) ([place])    Craniosynostosis    congenital   DDD (degenerative disc disease)    Family history of anesthesia complication    "PONV; both parents" (08/17/2013)   GERD (gastroesophageal reflux disease)    H/O hiatal hernia    H/O: hysterectomy 09/22/2014   By her report at age 21. Has never been on Norma replacement therapy. 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 Switched to lovastatin b/c its $4.     Hypertension    Hyponatremia 08/28/2020   IBS (irritable bowel syndrome)    Kidney stones 07/22/2017  Migraines    "since age 66; probably twice/month" (08/17/2013)   PONV (postoperative nausea and vomiting)    Postmenopausal atrophic vaginitis 09/22/2014   History of hysterectomy. We'll try adding estradiol cream 3 times a week. I would give this for 8 weeks and she's not having some improvement, I would have her  return to clinic. If she is having improvement she can decrease the frequency of her applications to once or twice a week. If she's not had improvement then I would consider increasing him to 7 days a week. She's not tolerating the vaginal    Rectal bleeding 10/22/2018   Rheumatoid arthritis ([place])    "in my knuckles" (08/17/2013)   Ruptured lumbar disc 1990's   L4-L5   Second degree AV block 02/10/2020   Shingles    Sinus pause 02/10/2020   Sleep apnea-like behavior 03/20/2020   Takes dietary supplements 07/28/2020     Past Surgical History: Past Surgical History:  Procedure Laterality Date   ABDOMINAL HYSTERECTOMY  1992   Total for chronic pelvic pain    APPENDECTOMY     BACK SURGERY  1990's   L4-L5 fusion @ [place]  05/28/2019   fused C4-5-6, done in [place]  2007   Performed in [place] 1960   "1st time got infected; 2nd time didn't take; fix the 3rd time" (08/17/2013)   POSTERIOR LUMBAR FUSION  1990's   "took piece off my hip" (08/17/2013)   TOTAL HIP ARTHROPLASTY Right 12/31/04   AVN after fall injury; performed by Ralene Cork; DePuy S-ROM total hip (metal on metal)   UPPER GASTROINTESTINAL ENDOSCOPY       Family History: Family History  Problem Relation Age of Onset   Hypertension Mother    Hypertension Father    Skin cancer Father    Breast cancer Maternal Grandmother    Colon cancer Paternal Grandfather    Rectal cancer Neg Hx    Stomach cancer Neg Hx    Esophageal cancer Neg Hx     Social History: Social History   Socioeconomic History   Marital status: Divorced    Spouse name: Not on file   Number of children: 2   Years of education: Not on file   Highest education level: Not on file  Occupational History   Occupation: retired  Tobacco Use   Smoking status: Former    Packs/day: 0.50    Years: 32.00    Total pack years: 16.00     Types: Cigarettes    Quit date: 05/30/2022    Years since quitting: 0.6    Passive exposure: Past   Smokeless tobacco: Never   Tobacco comments:    on chanix  Vaping Use   Vaping Use: Every day  Substance and Sexual Activity   Alcohol use: Yes    Alcohol/week: 0.0 standard drinks of alcohol    Comment: occasionally   Drug use: No   Sexual activity: Not Currently  Other Topics Concern   Not on file  Social History Narrative   Not on file   Social Determinants of Health   Financial Resource Strain: Not on file  Food Insecurity: No Food Insecurity (01/06/2023)   Hunger Vital Sign    Worried About Running Out of Food in the Last Year: Never true    Ran Out of Food in the Last Year: Never true  Transportation  Needs: No Transportation Needs (12/10/2022)   PRAPARE - Hydrologist (Medical): No    Lack of Transportation (Non-Medical): No  Physical Activity: Not on file  Stress: Not on file  Social Connections: Not on file    Allergies: Allergies  Allergen Reactions   Penicillins Anaphylaxis   Demerol [Meperidine] Nausea And Vomiting   Doxycycline Hyclate Nausea And Vomiting   Gabapentin Other (See Comments)    Malaise   Sulfa Antibiotics Hives   Atorvastatin     myalgias   Fluvastatin     myalgia   Levaquin [Levofloxacin]     Reported some possible facial swelling with administration 05/2018   Lovastatin     myalgia   No Known Allergies     Other reaction(s): Dizziness   Pitavastatin     myalgia   Pravastatin     myalgia   Rosuvastatin     myalgia   Simvastatin     myalgia   Benadryl [Diphenhydramine Hcl] Palpitations and Rash   Buprenorphine Hcl Nausea And Vomiting   Cephalexin Rash   Morphine And Related Nausea And Vomiting   Spiriva Handihaler [Tiotropium Bromide Monohydrate] Rash    Outpatient Meds: Current Outpatient Medications  Medication Sig Dispense Refill   albuterol (VENTOLIN HFA) 108 (90 Base) MCG/ACT inhaler Inhale 2  puffs into the lungs every 4 (four) hours as needed for wheezing or shortness of breath. 1 each 0   Alirocumab (PRALUENT) 150 MG/ML SOAJ Inject 150 mg into the skin every 14 (fourteen) days. 6 mL 1   allopurinol (ZYLOPRIM) 100 MG tablet Take 1 tablet (100 mg total) by mouth daily. 90 tablet 2   AMITIZA 24 MCG capsule Take 24 mcg by mouth daily.     amLODipine (NORVASC) 2.5 MG tablet Take 1 tablet (2.5 mg total) by mouth daily. 30 tablet 5   aspirin EC 325 MG tablet Take 325 mg by mouth daily.     aspirin-acetaminophen-caffeine (EXCEDRIN MIGRAINE) 250-250-65 MG tablet Take 2 tablets by mouth every 6 (six) hours as needed for headache.     azelastine (ASTELIN) 0.1 % nasal spray Place into the nose in the morning and at bedtime.     azithromycin (ZITHROMAX Z-PAK) 250 MG tablet Take 2 tablets on day 1 and then follow with 1 tablet daily for 4 more days 6 each 0   clopidogrel (PLAVIX) 75 MG tablet Take 75 mg by mouth daily.     co-enzyme Q-10 30 MG capsule Take 100 mg by mouth daily.     Cyanocobalamin (B-12) 3000 MCG CAPS Take 1 capsule by mouth daily.     diclofenac sodium (VOLTAREN) 1 % GEL Apply 2 g topically 4 (four) times daily as needed. 100 g 5   esomeprazole (NEXIUM) 40 MG capsule TAKE 1 CAPSULE BY MOUTH DAILY AT 12 NOON 90 capsule 1   famotidine (PEPCID) 20 MG tablet One after supper 30 tablet 11   HYDROcodone-acetaminophen (NORCO/VICODIN) 5-325 MG tablet Take 1 tablet by mouth every 8 (eight) hours as needed.     lidocaine (HM LIDOCAINE PATCH) 4 % Place 1 patch onto the skin daily. 30 patch 2   linaclotide (LINZESS) 145 MCG CAPS capsule TAKE 1 CAPSULE BY MOUTH DAILY BEFORE BREAKFAST 30 capsule 5   naloxone (NARCAN) nasal spray 4 mg/0.1 mL Place into the nose as needed. overdose     Pitavastatin Calcium 1 MG TABS Take 1 tablet (1 mg total) by mouth 2 (two) times a week. [place]  tablet 6   SUMAtriptan (IMITREX) 50 MG tablet Take 1 tablet (50 mg total) by mouth every 2 (two) hours as needed for  migraine. May repeat in 2 hours if headache persists or recurs. 10 tablet 3   Turmeric 500 MG CAPS Take 2 tablets by mouth daily.     venlafaxine XR (EFFEXOR-XR) 37.5 MG 24 hr capsule Take 1 capsule (37.5 mg total) by mouth daily with breakfast. 30 capsule 2   No current facility-administered medications for this visit.      ___________________________________________________________________ Objective   Exam:  There were no vitals taken for this visit. Wt Readings from Last 3 Encounters:  01/20/23 143 lb 3.2 oz (65 kg)  12/30/22 142 lb (64.4 kg)  12/29/22 143 lb 6.4 oz (65 kg)    General: ***  Eyes: sclera anicteric, no redness ENT: oral mucosa moist without lesions, no cervical or supraclavicular lymphadenopathy CV: ***, no JVD, no peripheral edema Resp: clear to auscultation bilaterally, normal RR and effort noted GI: soft, *** tenderness, with active bowel sounds. No guarding or palpable organomegaly noted. Skin; warm and dry, no rash or jaundice noted Neuro: awake, alert and oriented x 3. Normal gross motor function and fluent speech  Labs:     Latest Ref Rng & Units 12/29/2022    4:56 PM 07/13/2020    2:43 PM 01/13/2020   10:12 AM  CBC  WBC 3.4 - 10.8 x10E3/uL 13.5  7.1  6.3   Hemoglobin 11.1 - 15.9 g/dL 12.1  13.4  12.8   Hematocrit 34.0 - 46.6 % 35.6  41.7  38.2   Platelets 150 - 450 x10E3/uL 238  236        Latest Ref Rng & Units 12/29/2022    4:56 PM 08/23/2021    8:50 AM 05/06/2021    9:41 AM  CMP  Glucose 70 - 99 mg/dL 90     BUN 8 - 27 mg/dL 11     Creatinine 0.57 - 1.00 mg/dL 1.14     Sodium 134 - 144 mmol/L 132     Potassium 3.5 - 5.2 mmol/L 4.7     Chloride 96 - 106 mmol/L 94     CO2 20 - 29 mmol/L 21     Calcium 8.7 - 10.3 mg/dL 9.3     Total Protein 6.0 - 8.5 g/dL 6.6  6.2  6.7   Total Bilirubin 0.0 - 1.2 mg/dL 0.6  0.4  0.4   Alkaline Phos 44 - 121 IU/L 103  107  138   AST 0 - 40 IU/L '23  20  20   '$ ALT 0 - 32 IU/L '20  15  15      '$ Radiologic  Studies:  CLINICAL DATA:  RUQ pain   EXAM: ULTRASOUND ABDOMEN LIMITED RIGHT UPPER QUADRANT   COMPARISON:  July 30, 2004   FINDINGS: Gallbladder:   Surgically absent.   Common bile duct:   Diameter: Visualized portion measures 5 mm, within normal limits.   Liver:   No focal lesion identified. Coarsened hepatic parenchymal echogenicity. Portal vein is patent on color Doppler imaging with normal direction of blood flow towards the liver.   Other: None.   IMPRESSION: Coarsened hepatic parenchymal echogenicity. This is a nonspecific finding but can be seen in the setting of hepatic steatosis or underlying hepatocellular disease. Recommend correlation with LFTs. Abdominal ultrasound with elastography could be utilized if concern for underlying cirrhosis.     Electronically Signed   By: Valentino Saxon  M.D.   On: 01/02/2023 08:04  Assessment: No diagnosis found.  ***  Plan:  ***  Thank you for the courtesy of this consult.  Please call me with any questions or concerns.  Nelida Meuse III  CC: Referring provider noted above

## 2023-02-03 ENCOUNTER — Telehealth: Payer: Self-pay | Admitting: Cardiology

## 2023-02-03 NOTE — Telephone Encounter (Signed)
Patient called to report BP readings: 2/29 165/91  HR 95 3/1    170/94  HR 79 3/2    165/94  HR  85 3/3     133/98  HR 95 3/4     161/93  HR 85 3/5     160/94  HR 85

## 2023-02-04 ENCOUNTER — Encounter: Payer: Self-pay | Admitting: Obstetrics and Gynecology

## 2023-02-04 ENCOUNTER — Other Ambulatory Visit: Payer: Medicaid Other | Admitting: Obstetrics and Gynecology

## 2023-02-04 DIAGNOSIS — Z8673 Personal history of transient ischemic attack (TIA), and cerebral infarction without residual deficits: Secondary | ICD-10-CM | POA: Diagnosis not present

## 2023-02-04 DIAGNOSIS — R55 Syncope and collapse: Secondary | ICD-10-CM | POA: Diagnosis not present

## 2023-02-04 DIAGNOSIS — R413 Other amnesia: Secondary | ICD-10-CM | POA: Diagnosis not present

## 2023-02-04 DIAGNOSIS — R252 Cramp and spasm: Secondary | ICD-10-CM | POA: Diagnosis not present

## 2023-02-04 NOTE — Patient Outreach (Signed)
Medicaid Managed Care   Nurse Care Manager Note  02/04/2023 Name:  Diana [name] MRN:  [id number] DOB:  1956-12-01  Diana [name] is an 67 y.o. year old female who is a primary patient of Diana Dad, MD.  The Medicaid Managed Care Coordination team was consulted for assistance with:    Chronic healthcare management needs, asthma, CAD, CKD, HLD, syncope, chronic pain, HTN, RLS, migraines, IBS  [name] was given information about Medicaid Managed Care Coordination team services today. Diana [name] Patient agreed to services and verbal consent obtained.  Engaged with patient by telephone for follow up visit in response to provider referral for case management and/or care coordination services.   Assessments/Interventions:  Review of past medical history, allergies, medications, health status, including review of consultants reports, laboratory and other test data, was performed as part of comprehensive evaluation and provision of chronic care management services.  SDOH (Social Determinants of Health) assessments and interventions performed: SDOH Interventions    Flowsheet Row Patient Outreach Telephone from 02/04/2023 in [place] Patient Outreach Telephone from 01/06/2023 in [place] Patient Outreach Telephone from 12/10/2022 in [place] Interventions     Food Insecurity Interventions -- Intervention Not Indicated --  Housing Interventions -- Intervention Not Indicated --  Transportation Interventions -- -- Intervention Not Indicated  Utilities Interventions Intervention Not Indicated -- --  Alcohol Usage Interventions Intervention Not Indicated (Score <7) -- --     Care Plan  Allergies  Allergen Reactions   Penicillins Anaphylaxis   Demerol [Meperidine] Nausea And Vomiting   Doxycycline Hyclate Nausea And Vomiting   Gabapentin Other (See Comments)    Malaise   Sulfa Antibiotics Hives    Atorvastatin     myalgias   Fluvastatin     myalgia   Levaquin [Levofloxacin]     Reported some possible facial swelling with administration 05/2018   Lovastatin     myalgia   No Known Allergies     Other reaction(s): Dizziness   Pitavastatin     myalgia   Pravastatin     myalgia   Rosuvastatin     myalgia   Simvastatin     myalgia   Benadryl [Diphenhydramine Hcl] Palpitations and Rash   Buprenorphine Hcl Nausea And Vomiting   Cephalexin Rash   Morphine And Related Nausea And Vomiting   Spiriva Handihaler [Tiotropium Bromide Monohydrate] Rash   Medications Reviewed Today     Reviewed by Diana Medicus, RN (Registered Nurse) on 02/04/23 at 18  Med List Status: <None>   Medication Order Taking? Sig Documenting Provider Last Dose Status Informant  albuterol (VENTOLIN HFA) 108 (90 Base) MCG/ACT inhaler [id number] No Inhale 2 puffs into the lungs every 4 (four) hours as needed for wheezing or shortness of breath. [name], [name], DO Taking Active   Alirocumab (PRALUENT) 150 MG/ML SOAJ [id number] No Inject 150 mg into the skin every 14 (fourteen) days. Diana Salines, DO Taking Active   allopurinol (ZYLOPRIM) 100 MG tablet [id number] No Take 1 tablet (100 mg total) by mouth daily. Diana Dad, MD Taking Active   AMITIZA 24 MCG capsule [id number] No Take 24 mcg by mouth daily. [provider] Taking Active   amLODipine (NORVASC) 2.5 MG tablet [id number] No Take 1 tablet (2.5 mg total) by mouth daily. Diana Salines, DO Taking Active   aspirin EC 325 MG tablet [id number] No Take 325 mg by mouth daily. [provider] Taking  Active   aspirin-acetaminophen-caffeine (EXCEDRIN MIGRAINE) 250-250-65 MG tablet [id number] No Take 2 tablets by mouth every 6 (six) hours as needed for headache. [provider] Taking Active   azelastine (ASTELIN) 0.1 % nasal spray [id number] No Place into the nose in the morning and at bedtime. [provider] Taking Active    azithromycin (ZITHROMAX Z-PAK) 250 MG tablet [id number] No Take 2 tablets on day 1 and then follow with 1 tablet daily for 4 more days [name], [name], DO Taking Active   clopidogrel (PLAVIX) 75 MG tablet [id number] No Take 75 mg by mouth daily. [provider] Taking Active   co-enzyme Q-10 30 MG capsule [id number] No Take 100 mg by mouth daily. [provider] Taking Active   Cyanocobalamin (B-12) 3000 MCG CAPS [id number] No Take 1 capsule by mouth daily. [provider] Taking Active   diclofenac sodium (VOLTAREN) 1 % GEL [id number] No Apply 2 g topically 4 (four) times daily as needed. Diana Smoker, MD Taking Active   esomeprazole (NEXIUM) 40 MG capsule [id number] No TAKE 1 CAPSULE BY MOUTH DAILY AT 12 Diana [name], Diana Beach, MD Taking Active   famotidine (PEPCID) 20 MG tablet [id number] No One after supper Diana Rockers, MD Taking Active   HYDROcodone-acetaminophen (NORCO/VICODIN) 5-325 MG tablet [id number] No Take 1 tablet by mouth every 8 (eight) hours as needed. [provider] Taking Active   lidocaine (HM LIDOCAINE PATCH) 4 % [id number] No Place 1 patch onto the skin daily. Diana Dad, MD Taking Active   linaclotide [facility]) 145 MCG CAPS capsule [id number] No TAKE 1 CAPSULE BY MOUTH DAILY BEFORE Diana Hay, MD Taking Active   naloxone [facility]) nasal spray 4 mg/0.1 mL [id number] No Place into the nose as needed. overdose [provider] Taking Active   Pitavastatin Calcium 1 MG TABS [id number] No Take 1 tablet (1 mg total) by mouth 2 (two) times a week. [name], Diana Pick, DO Taking Active   SUMAtriptan (IMITREX) 50 MG tablet [id number] No Take 1 tablet (50 mg total) by mouth every 2 (two) hours as needed for migraine. May repeat in 2 hours if headache persists or recurs. Diana Dad, MD Taking Active   Turmeric 500 MG CAPS [id number] No Take 2 tablets by mouth daily. [provider] Taking Active   venlafaxine XR  (EFFEXOR-XR) 37.5 MG 24 hr capsule [id number] No Take 1 capsule (37.5 mg total) by mouth daily with breakfast. 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 12/26/2021   Encounter for hearing screening with abnormal findings 06/14/2021   Rotator cuff tear, non-traumatic, right 05/21/2021   Lung nodule 09/19/2020   Dry eye syndrome of both eyes 01/15/2020   Memory change 02/07/2019   Tobacco abuse 12/08/2017   Chronic obstructive pulmonary disease ([place]) 03/04/2017   Postmenopausal bone loss 05/29/2014   Migraine aura, persistent, intractable 05/24/2014   CKD (chronic kidney disease), 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   Atrophic vaginitis 08/28/2012   Irritable bowel syndrome (IBS) 06/07/2012   Restless leg syndrome 05/28/2012   Anxiety 05/28/2012   Hypertension, essential, benign 05/28/2012  Hyperlipidemia 05/28/2012   Barrett's esophagus 05/20/2012   Conditions to be addressed/monitored per PCP order:  Chronic healthcare management needs, asthma, CAD, CKD, HLD, syncope, chronic pain, HTN, RLS, migraines, IBS  Care Plan : RN Care Manager Plan of Care  Updates made by Diana Medicus, RN since 02/04/2023 12:00 AM     Problem: Health Promotion or Disease Self-Management (General Plan of Care)      Goal: Establish Plan of Care for Chronic Disease Management Needs   Priority: High  Note:   Timeframe:  Long-Range Goal Priority:  High Start Date:      12/10/22                       Expected End Date:    ongoing                  Follow Up Date 03/06/23   - schedule appointment for flu shot - schedule  appointment for vaccines needed due to my age or health - schedule recommended health tests (blood work, mammogram, colonoscopy, pap test) - schedule and keep appointment for annual check-up    Why is this important?   Screening tests can find diseases early when they are easier to treat.  Your doctor or nurse will talk with you about which tests are important for you.  Getting shots for common diseases like the flu and shingles will help prevent them. 02/04/23:  recent appts with PCP, [facility], GI, [facility], to have CT 3/21   Long-Range Goal: Chronic Disease Management   Start Date: 12/10/2022  Expected End Date: 03/11/2023  Priority: High  Note:   .Current Barriers:  Knowledge Deficits related to plan of care for management of CAD, HTN, HLD, COPD, CKD Stage 2, Gout, Asthma, and syncope, chronic pain, RLS, migraines, IBS, vaping  Chronic Disease Management support and education needs related to CAD, HTN, HLD, COPD, CKD Stage 2, Gout, Asthma, and syncope, chronic pain, RLS, migraines, IBS, vaping 02/04/23:  Syncope episodes continue-being worked up.  BP 129/84 at [facility] appt 2/26-BP meds managed by [facility].  Continues to vape everyday.  To have CT 3/21  RNCM Clinical Goal(s):  Patient will verbalize understanding of plan for management of CAD, HTN, HLD, COPD, CKD Stage 2, gout, Asthma, and syncope, chronic pain, RLS, migraines, IBS,vaping as evidenced by patient report and taking all medications verbalize basic understanding of  CAD, HTN, HLD, COPD, CKD Stage 2, Gout, Asthma, and syncope, chronic pain, RLS, migraines,IBS,  vaping  disease process and self health management plan as evidenced by patient report and taking all medications take all medications exactly as prescribed and will call provider for medication related questions as evidenced by patient report demonstrate understanding of rationale for each prescribed medication as evidenced by patient report attend all scheduled medical appointments as  evidenced by patient report demonstrate Improved adherence to prescribed treatment plan for CAD, HTN, HLD, COPD, CKD Stage 2, Gout, Asthma, and syncope, chronic pain, RLS, migraines, IBS, vaping  as evidenced by patient report and taking all medications continue to work with RN Care Manager to address care management and care coordination needs related to  CAD, HTN, HLD, COPD, CKD Stage 2, Gout, and Asthma, syncope, chronic pain, RLS, migraines, IBS, vaping as evidenced by adherence to CM Team Scheduled appointments through collaboration with RN Care manager, provider, and care team.   Interventions: Inter-disciplinary care team collaboration (see longitudinal plan of care) Evaluation of current treatment plan related to  self management and patient's adherence to plan as established by provider  Asthma: (Status:New goal.) Long Term Goal Provided education about and advised patient to utilize infection prevention strategies to reduce risk of respiratory infection Discussed the importance of adequate rest and management of fatigue with Asthma Assessed social determinant of health barriers   CAD Interventions: (Status:  New goal.) Long Term Goal Assessed understanding of CAD diagnosis Medications reviewed including medications utilized in CAD treatment plan Reviewed Importance of taking all medications as prescribed Reviewed Importance of attending all scheduled provider appointments Advised patient to discuss medications with provider Assessed social determinant of health barriers   Chronic Kidney Disease Interventions:  (Status:  New goal.) Long Term Goal Evaluation of current treatment plan related to chronic kidney disease self management and patient's adherence to plan as established by provider      Reviewed medications with patient and discussed importance of compliance    Advised patient, providing education and rationale, to monitor blood pressure daily and record, calling PCP for  findings outside established parameters    Reviewed scheduled/upcoming provider appointments including    Discussed plans with patient for ongoing care management follow up and provided patient with direct contact information for care management team    Assessed social determinant of health barriers    Last practice recorded BP readings:  BP Readings from Last 3 Encounters:  11/19/22 98/60  11/10/22 (!) 146/73  11/10/22 (!) 140/84  01/26/23         129/84 Most recent eGFR/CrCl: No results found for: "EGFR"  No components found for: "CRCL"  COPD Interventions:  (Status:  New goal.) Long Term Goal Provided education about and advised patient to utilize infection prevention strategies to reduce risk of respiratory infection Discussed the importance of adequate rest and management of fatigue with COPD Assessed social determinant of health barriers  Hyperlipidemia Interventions:  (Status:  New goal.) Long Term Goal Medication review performed; medication list updated in electronic medical record.  Assessed social determinant of health barriers   Hypertension Interventions:  (Status:  New goal.) Long Term Goal Last practice recorded BP readings:  BP Readings from Last 3 Encounters:  11/19/22 98/60  11/10/22 (!) 146/73  11/10/22 (!) 140/84  01/26/23         129/84 Most recent eGFR/CrCl: No results found for: "EGFR"  No components found for: "CRCL"  Evaluation of current treatment plan related to hypertension self management and patient's adherence to plan as established by provider Reviewed medications with patient and discussed importance of compliance Discussed plans with patient for ongoing care management follow up and provided patient with direct contact information for care management team Advised patient, providing education and rationale, to monitor blood pressure daily and record, calling PCP for findings outside established parameters Reviewed scheduled/upcoming provider  appointments including:  Advised patient to discuss medication  with provider Assessed social determinant of health barriers  Pain Interventions:  (Status:  New goal.) Long Term Goal Pain assessment performed Medications reviewed Reviewed provider established plan for pain management Discussed importance of adherence to all scheduled medical appointments Counseled on the importance of reporting any/all new or changed pain symptoms or management strategies to pain management provider Advised patient to report to care team affect of pain on daily activities Discussed use of relaxation techniques and/or diversional activities to assist with pain reduction (distraction, imagery, relaxation, massage, acupressure, TENS, heat, and cold application Reviewed with patient prescribed pharmacological and nonpharmacological pain relief strategies Assessed social determinant of health barriers  Patient Goals/Self-Care Activities: Take all medications as prescribed Attend all scheduled provider appointments Call pharmacy for medication refills 3-7 days in advance of running out of medications Perform all self care activities independently  Perform IADL's (shopping, preparing meals, housekeeping, managing finances) independently Call provider office for new concerns or questions  Patient to follow up with provider regarding cough, BP medication, RLS-completed  Follow Up Plan:  The patient has been provided with contact information for the care management team and has been advised to call with any health related questions or concerns.  The care management team will reach out to the patient again over the next 30 business  days.    Follow Up:  Patient agrees to Care Plan and Follow-up.  Plan: The Managed Medicaid care management team will reach out to the patient again over the next 30 business  days. and The  Patient has been provided with contact information for the Managed Medicaid care management team  and has been advised to call with any health related questions or concerns.  Date/time of next scheduled RN care management/care coordination outreach: 03/06/23 at 230

## 2023-02-04 NOTE — Patient Instructions (Signed)
Hi Diana [name], pleasure speaking with you again-have a terrific afternoon!  Diana [name] was given information about Medicaid Managed Care team care coordination services as a part of their Healthy [facility] Medicaid benefit. Diana [name] verbally consented to engagement with the [facility] Managed Care team.   If you are experiencing a medical emergency, please call 911 or report to your local emergency department or urgent care.   If you have a non-emergency medical problem during routine business hours, please contact your provider's office and ask to speak with a nurse.   For questions related to your Healthy [facility] health plan, please call: [phone] or visit the homepage here: [email or web address]  If you would like to schedule transportation through your Healthy [facility] plan, please call the following number at least 2 days in advance of your appointment: [phone]  For information about your ride after you set it up, call Ride Assist at [phone]. Use this number to activate a Will Call pickup, or if your transportation is late for a scheduled pickup. Use this number, too, if you need to make a change or cancel a previously scheduled reservation.  If you need transportation services right away, call [phone]. The after-hours call center is staffed 24 hours to handle ride assistance and urgent reservation requests (including discharges) 365 days a year. Urgent trips include sick visits, hospital discharge requests and life-sustaining treatment.  Call the [facility] at [phone], at any time, 24 hours a day, 7 days a week. If you are in danger or need immediate medical attention call 911.  If you would like help to quit smoking, call 1-800-QUIT-NOW (201) 562-6124) OR Espaol: 1-855-Djelo-Ya HD:1601594) o para ms informacin haga clic aqu or Text READY to 200-400 to register via text  Diana [name] - following are the  goals we discussed in your visit today:   Goals Addressed    Timeframe:  Long-Range Goal Priority:  High Start Date:      12/10/22                       Expected End Date:    ongoing                  Follow Up Date 03/06/23   - schedule appointment for flu shot - schedule appointment for vaccines needed due to my age or health - schedule recommended health tests (blood work, mammogram, colonoscopy, pap test) - schedule and keep appointment for annual check-up    Why is this important?   Screening tests can find diseases early when they are easier to treat.  Your doctor or nurse will talk with you about which tests are important for you.  Getting shots for common diseases like the flu and shingles will help prevent them. 02/04/23:  recent appts with PCP, [facility], GI, NEURO, to have CT 3/21  Patient verbalizes understanding of instructions and care plan provided today and agrees to view in [name]. Active MyChart status and patient understanding of how to access instructions and care plan via MyChart confirmed with patient.     The Managed Medicaid care management team will reach out to the patient again over the next 30 business  days.  The  Patient has been provided with contact information for the Managed Medicaid care management team and has been advised to call with any health related questions or concerns.   [name], [name]   [employer] Curator -  Managed Medicaid High Risk [phone]   Following is a copy of your plan of care:  Care Plan : [place] of Care  Updates made by [name], RN since 02/04/2023 12:00 AM     Problem: Health Promotion or Disease Self-Management (General Plan of Care)      Long-Range Goal: Chronic Disease Management   Start Date: 12/10/2022  Expected End Date: 03/11/2023  Priority: High  Note:   .Current Barriers:  Knowledge Deficits related to plan of care for management of CAD, HTN,  HLD, COPD, CKD Stage 2, Gout, Asthma, and syncope, chronic pain, RLS, migraines, IBS, vaping  Chronic Disease Management support and education needs related to CAD, HTN, HLD, COPD, CKD Stage 2, Gout, Asthma, and syncope, chronic pain, RLS, migraines, IBS, vaping 02/04/23:  Syncope episodes continue-being worked up.  BP 129/84 at [facility] appt 2/26-BP meds managed by [facility].  Continues to vape everyday.  To have CT 3/21  RNCM Clinical Goal(s):  Patient will verbalize understanding of plan for management of CAD, HTN, HLD, COPD, CKD Stage 2, gout, Asthma, and syncope, chronic pain, RLS, migraines, IBS,vaping as evidenced by patient report and taking all medications verbalize basic understanding of  CAD, HTN, HLD, COPD, CKD Stage 2, Gout, Asthma, and syncope, chronic pain, RLS, migraines,IBS,  vaping  disease process and self health management plan as evidenced by patient report and taking all medications take all medications exactly as prescribed and will call provider for medication related questions as evidenced by patient report demonstrate understanding of rationale for each prescribed medication as evidenced by patient report attend all scheduled medical appointments as evidenced by patient report demonstrate Improved adherence to prescribed treatment plan for CAD, HTN, HLD, COPD, CKD Stage 2, Gout, Asthma, and syncope, chronic pain, RLS, migraines, IBS, vaping  as evidenced by patient report and taking all medications continue to work with RN Care Manager to address care management and care coordination needs related to  CAD, HTN, HLD, COPD, CKD Stage 2, Gout, and Asthma, syncope, chronic pain, RLS, migraines, IBS, vaping as evidenced by adherence to CM Team Scheduled appointments through collaboration with RN Care manager, provider, and care team.   Interventions: Inter-disciplinary care team collaboration (see longitudinal plan of care) Evaluation of current treatment plan related to  self management  and patient's adherence to plan as established by provider  Asthma: (Status:New goal.) Long Term Goal Provided education about and advised patient to utilize infection prevention strategies to reduce risk of respiratory infection Discussed the importance of adequate rest and management of fatigue with Asthma Assessed social determinant of health barriers   CAD Interventions: (Status:  New goal.) Long Term Goal Assessed understanding of CAD diagnosis Medications reviewed including medications utilized in CAD treatment plan Reviewed Importance of taking all medications as prescribed Reviewed Importance of attending all scheduled provider appointments Advised patient to discuss medications with provider Assessed social determinant of health barriers   Chronic Kidney Disease Interventions:  (Status:  New goal.) Long Term Goal Evaluation of current treatment plan related to chronic kidney disease self management and patient's adherence to plan as established by provider      Reviewed medications with patient and discussed importance of compliance    Advised patient, providing education and rationale, to monitor blood pressure daily and record, calling PCP for findings outside established parameters    Reviewed scheduled/upcoming provider appointments including    Discussed plans with patient for ongoing care management follow up and provided patient with direct  contact information for care management team    Assessed social determinant of health barriers    Last practice recorded BP readings:  BP Readings from Last 3 Encounters:  11/19/22 98/60  11/10/22 (!) 146/73  11/10/22 (!) 140/84  01/26/23         129/84 Most recent eGFR/CrCl: No results found for: "EGFR"  No components found for: "CRCL"  COPD Interventions:  (Status:  New goal.) Long Term Goal Provided education about and advised patient to utilize infection prevention strategies to reduce risk of respiratory infection Discussed the  importance of adequate rest and management of fatigue with COPD Assessed social determinant of health barriers  Hyperlipidemia Interventions:  (Status:  New goal.) Long Term Goal Medication review performed; medication list updated in electronic medical record.  Assessed social determinant of health barriers   Hypertension Interventions:  (Status:  New goal.) Long Term Goal Last practice recorded BP readings:  BP Readings from Last 3 Encounters:  11/19/22 98/60  11/10/22 (!) 146/73  11/10/22 (!) 140/84  01/26/23         129/84 Most recent eGFR/CrCl: No results found for: "EGFR"  No components found for: "CRCL"  Evaluation of current treatment plan related to hypertension self management and patient's adherence to plan as established by provider Reviewed medications with patient and discussed importance of compliance Discussed plans with patient for ongoing care management follow up and provided patient with direct contact information for care management team Advised patient, providing education and rationale, to monitor blood pressure daily and record, calling PCP for findings outside established parameters Reviewed scheduled/upcoming provider appointments including:  Advised patient to discuss medication  with provider Assessed social determinant of health barriers  Pain Interventions:  (Status:  New goal.) Long Term Goal Pain assessment performed Medications reviewed Reviewed provider established plan for pain management Discussed importance of adherence to all scheduled medical appointments Counseled on the importance of reporting any/all new or changed pain symptoms or management strategies to pain management provider Advised patient to report to care team affect of pain on daily activities Discussed use of relaxation techniques and/or diversional activities to assist with pain reduction (distraction, imagery, relaxation, massage, acupressure, TENS, heat, and cold  application Reviewed with patient prescribed pharmacological and nonpharmacological pain relief strategies Assessed social determinant of health barriers  Patient Goals/Self-Care Activities: Take all medications as prescribed Attend all scheduled provider appointments Call pharmacy for medication refills 3-7 days in advance of running out of medications Perform all self care activities independently  Perform IADL's (shopping, preparing meals, housekeeping, managing finances) independently Call provider office for new concerns or questions  Patient to follow up with provider regarding cough, BP medication, RLS-completed  Follow Up Plan:  The patient has been provided with contact information for the care management team and has been advised to call with any health related questions or concerns.  The care management team will reach out to the patient again over the next 30 business  days.

## 2023-02-10 MED ORDER — OLMESARTAN MEDOXOMIL 5 MG PO TABS: 5.0000 mg | ORAL_TABLET | Freq: Every day | ORAL | 1 refills | Status: DC

## 2023-02-10 MED ORDER — AMLODIPINE BESYLATE 2.5 MG PO TABS: 5.0000 mg | ORAL_TABLET | Freq: Every day | ORAL | 5 refills | Status: DC

## 2023-02-10 NOTE — Telephone Encounter (Signed)
We had previously discontinued olmesartan '10mg'$  since it was causing her to be hypotensive. Recommend she restart, however at 1/2 tablet ('5mg'$ )

## 2023-02-10 NOTE — Telephone Encounter (Signed)
Pt c/o BP issue: STAT if pt c/o blurred vision, one-sided weakness or slurred speech  1. What are your last 5 BP readings?  3/06: 135/91 3/07: 160/94 3/08: 163/97 3/09: 139/94 3/10: 169/92 3/11: 13586 3/12: 163/91  2. Are you having any other symptoms (ex. Dizziness, headache, blurred vision, passed out)?  Severe headaches  3. What is your BP issue?   Patient is following up. She states her BP is still elevated and she doubled her Amlodipine 2.5 MG on 3/09.

## 2023-02-10 NOTE — Telephone Encounter (Signed)
Called pt. She states she has been having pretty bad headaches. On Saturday 3/9, 3/10, 3/11 and today pt has taken Amlodipine '5mg'$ . Pt denies other symptoms just severe headaches.

## 2023-02-10 NOTE — Telephone Encounter (Signed)
Called pt to relay Gerald Stabs' recommendation. Olmesartan sent into pharmacy for pt. Ordered 5 mg tablets.

## 2023-02-12 DIAGNOSIS — K5903 Drug induced constipation: Secondary | ICD-10-CM | POA: Diagnosis not present

## 2023-02-12 DIAGNOSIS — G894 Chronic pain syndrome: Secondary | ICD-10-CM | POA: Diagnosis not present

## 2023-02-12 DIAGNOSIS — Z79891 Long term (current) use of opiate analgesic: Secondary | ICD-10-CM | POA: Diagnosis not present

## 2023-02-12 DIAGNOSIS — R413 Other amnesia: Secondary | ICD-10-CM | POA: Diagnosis not present

## 2023-02-12 DIAGNOSIS — M5416 Radiculopathy, lumbar region: Secondary | ICD-10-CM | POA: Diagnosis not present

## 2023-02-19 ENCOUNTER — Ambulatory Visit (HOSPITAL_COMMUNITY)
Admission: RE | Admit: 2023-02-19 | Discharge: 2023-02-19 | Disposition: A | Discharge status: Home / Self Care | Payer: Medicaid Other | Source: Ambulatory Visit | Attending: Gastroenterology | Admitting: Gastroenterology

## 2023-02-19 DIAGNOSIS — Z720 Tobacco use: Secondary | ICD-10-CM | POA: Diagnosis not present

## 2023-02-19 DIAGNOSIS — I700 Atherosclerosis of aorta: Secondary | ICD-10-CM | POA: Diagnosis not present

## 2023-02-19 DIAGNOSIS — R109 Unspecified abdominal pain: Secondary | ICD-10-CM | POA: Diagnosis not present

## 2023-02-19 DIAGNOSIS — R1013 Epigastric pain: Secondary | ICD-10-CM | POA: Insufficient documentation

## 2023-02-19 LAB — POCT I-STAT CREATININE: Creatinine, Ser: 0.9 mg/dL (ref 0.44–1.00)

## 2023-02-19 MED ORDER — IOHEXOL 300 MG/ML  SOLN
100.0000 mL | Freq: Once | INTRAMUSCULAR | Status: AC | PRN
  Administered 2023-02-19: 100 mL via INTRAVENOUS

## 2023-02-19 MED ORDER — IOHEXOL 9 MG/ML PO SOLN
ORAL | Status: AC
  Filled 2023-02-19: qty 1000

## 2023-02-19 MED ORDER — IOHEXOL 9 MG/ML PO SOLN
500.0000 mL | ORAL | Status: AC
  Administered 2023-02-19: 1000 mL via ORAL

## 2023-02-19 MED ORDER — SODIUM CHLORIDE (PF) 0.9 % IJ SOLN
INTRAMUSCULAR | Status: AC
  Filled 2023-02-19: qty 50

## 2023-02-26 DIAGNOSIS — M5416 Radiculopathy, lumbar region: Secondary | ICD-10-CM | POA: Diagnosis not present

## 2023-02-26 DIAGNOSIS — G894 Chronic pain syndrome: Secondary | ICD-10-CM | POA: Diagnosis not present

## 2023-02-26 DIAGNOSIS — K5903 Drug induced constipation: Secondary | ICD-10-CM | POA: Diagnosis not present

## 2023-02-26 DIAGNOSIS — R413 Other amnesia: Secondary | ICD-10-CM | POA: Diagnosis not present

## 2023-02-26 DIAGNOSIS — Z79891 Long term (current) use of opiate analgesic: Secondary | ICD-10-CM | POA: Diagnosis not present

## 2023-03-05 DIAGNOSIS — R42 Dizziness and giddiness: Secondary | ICD-10-CM | POA: Diagnosis not present

## 2023-03-05 DIAGNOSIS — I10 Essential (primary) hypertension: Secondary | ICD-10-CM | POA: Diagnosis not present

## 2023-03-05 DIAGNOSIS — R55 Syncope and collapse: Secondary | ICD-10-CM | POA: Diagnosis not present

## 2023-03-05 DIAGNOSIS — I951 Orthostatic hypotension: Secondary | ICD-10-CM | POA: Diagnosis not present

## 2023-03-06 ENCOUNTER — Encounter: Payer: Self-pay | Admitting: Obstetrics and Gynecology

## 2023-03-06 ENCOUNTER — Other Ambulatory Visit: Payer: Medicaid Other | Admitting: Obstetrics and Gynecology

## 2023-03-06 NOTE — Patient Instructions (Signed)
Hi Ms. Diana [name]-great to speak to you this afternoon-have a great time on your cruise!!  Ms. Diana [name] was given information about Medicaid Managed Care team care coordination services as a part of their Healthy [facility] benefit. Diana FiddlerKathy O Mould verbally consented to [facility].   If you are experiencing a medical emergency, please call 911 or report to your local emergency department or urgent care.   If you have a non-emergency medical problem during routine business hours, please contact your provider's office and ask to speak with a nurse.   For questions related to your Healthy [facility] health plan, please call: [phone] or visit the homepage here: [email or web address]  If you would like to schedule transportation through your [facility] plan, please call the following number at least 2 days in advance of your appointment: [phone]  For information about your ride after you set it up, call Ride Assist at [phone]. Use this number to activate a Will Call pickup, or if your transportation is late for a scheduled pickup. Use this number, too, if you need to make a change or cancel a previously scheduled reservation.  If you need transportation services right away, call [phone]. The after-hours call center is staffed 24 hours to handle ride assistance and urgent reservation requests (including discharges) 365 days a year. Urgent trips include sick visits, hospital discharge requests and life-sustaining treatment.  Call the [facility] at [phone], at any time, 24 hours a day, 7 days a week. If you are in danger or need immediate medical attention call 911.  If you would like help to quit smoking, call 1-800-QUIT-NOW ((570)693-88151-(563)450-9298) OR Espaol: 1-855-Djelo-Ya (2-505-397-6734(1-249-765-6711) o para ms informacin haga clic aqu or Text READY to 193-790200-400 to register via text  Ms. Diana [name] -  following are the goals we discussed in your visit today:   Goals Addressed    Timeframe:  Long-Range Goal Priority:  High Start Date:      12/10/22                       Expected End Date:    ongoing                  Follow Up Date 04/06/23   - schedule appointment for flu shot - schedule appointment for vaccines needed due to my age or health - schedule recommended health tests (blood work, mammogram, colonoscopy, pap test) - schedule and keep appointment for annual check-up    Why is this important?   Screening tests can find diseases early when they are easier to treat.  Your doctor or nurse will talk with you about which tests are important for you.  Getting shots for common diseases like the flu and shingles will help prevent them. 03/06/23:  Upcoming appts with pain management, ear, and vascular  Patient verbalizes understanding of instructions and care plan provided today and agrees to view in MyChart. Active MyChart status and patient understanding of how to access instructions and care plan via MyChart confirmed with patient.     The Managed Medicaid care management team will reach out to the patient again over the next 30 business  days.  The  Patient has been provided with contact information for the Managed Medicaid care management team and has been advised to call with any health related questions or concerns.   Kathi Dererri Brogan England RN, [name] [place]  [facility] [facility] Care Management Coordinator -  Managed Medicaid High Risk [phone]   Following is a copy of your plan of care:  Care Plan : RN Care Manager Plan of Care  Updates made by Danie Chandler, RN since 03/06/2023 12:00 AM     Problem: Health Promotion or Disease Self-Management (General Plan of Care)      Goal: Establish Plan of Care for Chronic Disease Management Needs   Priority: High  Note:   Timeframe:  Long-Range Goal Priority:  High Start Date:      12/10/22                       Expected End Date:     ongoing                  Follow Up Date 04/06/23   - schedule appointment for flu shot - schedule appointment for vaccines needed due to my age or health - schedule recommended health tests (blood work, mammogram, colonoscopy, pap test) - schedule and keep appointment for annual check-up    Why is this important?   Screening tests can find diseases early when they are easier to treat.  Your doctor or nurse will talk with you about which tests are important for you.  Getting shots for common diseases like the flu and shingles will help prevent them. 03/06/23:  Upcoming appts with pain management, ear, and vascular     Long-Range Goal: Chronic Disease Management   Start Date: 12/10/2022  Expected End Date: 06/05/2023  Priority: High  Note:   .Current Barriers:  Knowledge Deficits related to plan of care for management of CAD, HTN, HLD, COPD, CKD Stage 2, Gout, Asthma, and syncope, chronic pain, RLS, migraines, IBS, vaping  Chronic Disease Management support and education needs related to CAD, HTN, HLD, COPD, CKD Stage 2, Gout, Asthma, and syncope, chronic pain, RLS, migraines, IBS, vaping 03/06/23:  Patient leaving on cruise Sunday for 5 days.  Patient remains orthostatic and vaso vagal-tilt table testing done.  Patient with labile BP.  Patient up to date on all appts.  Denies chest pain, SOB-vapes every day.  Right sided pain is less-CT neg.  RNCM Clinical Goal(s):  Patient will verbalize understanding of plan for management of CAD, HTN, HLD, COPD, CKD Stage 2, gout, Asthma, and syncope, chronic pain, RLS, migraines, IBS,vaping as evidenced by patient report and taking all medications verbalize basic understanding of  CAD, HTN, HLD, COPD, CKD Stage 2, Gout, Asthma, and syncope, chronic pain, RLS, migraines,IBS,  vaping  disease process and self health management plan as evidenced by patient report and taking all medications take all medications exactly as prescribed and will call provider for  medication related questions as evidenced by patient report demonstrate understanding of rationale for each prescribed medication as evidenced by patient report attend all scheduled medical appointments as evidenced by patient report demonstrate Improved adherence to prescribed treatment plan for CAD, HTN, HLD, COPD, CKD Stage 2, Gout, Asthma, and syncope, chronic pain, RLS, migraines, IBS, vaping  as evidenced by patient report and taking all medications continue to work with RN Care Manager to address care management and care coordination needs related to  CAD, HTN, HLD, COPD, CKD Stage 2, Gout, and Asthma, syncope, chronic pain, RLS, migraines, IBS, vaping as evidenced by adherence to CM Team Scheduled appointments through collaboration with RN Care manager, provider, and care team.   Interventions: Inter-disciplinary care team collaboration (see longitudinal plan of care) Evaluation of current treatment plan related  to  self management and patient's adherence to plan as established by provider  Asthma: (Status:New goal.) Long Term Goal Provided education about and advised patient to utilize infection prevention strategies to reduce risk of respiratory infection Discussed the importance of adequate rest and management of fatigue with Asthma Assessed social determinant of health barriers   CAD Interventions: (Status:  New goal.) Long Term Goal Assessed understanding of CAD diagnosis Medications reviewed including medications utilized in CAD treatment plan Reviewed Importance of taking all medications as prescribed Reviewed Importance of attending all scheduled provider appointments Advised patient to discuss medications with provider Assessed social determinant of health barriers   Chronic Kidney Disease Interventions:  (Status:  New goal.) Long Term Goal Evaluation of current treatment plan related to chronic kidney disease self management and patient's adherence to plan as established by  provider      Reviewed medications with patient and discussed importance of compliance    Advised patient, providing education and rationale, to monitor blood pressure daily and record, calling PCP for findings outside established parameters    Reviewed scheduled/upcoming provider appointments including    Discussed plans with patient for ongoing care management follow up and provided patient with direct contact information for care management team    Assessed social determinant of health barriers    Last practice recorded BP readings:  BP Readings from Last 3 Encounters:  11/19/22 98/60  11/10/22 (!) 146/73  11/10/22 (!) 140/84  01/26/23         129/84 Most recent eGFR/CrCl: No results found for: "EGFR"  No components found for: "CRCL"  COPD Interventions:  (Status:  New goal.) Long Term Goal Provided education about and advised patient to utilize infection prevention strategies to reduce risk of respiratory infection Discussed the importance of adequate rest and management of fatigue with COPD Assessed social determinant of health barriers  Hyperlipidemia Interventions:  (Status:  New goal.) Long Term Goal Medication review performed; medication list updated in electronic medical record.  Assessed social determinant of health barriers   Hypertension Interventions:  (Status:  New goal.) Long Term Goal Last practice recorded BP readings:  BP Readings from Last 3 Encounters:  11/19/22 98/60  11/10/22 (!) 146/73  11/10/22 (!) 140/84  01/26/23         129/84 03/06/23:           135-165/76-93 Most recent eGFR/CrCl: No results found for: "EGFR"  No components found for: "CRCL"  Evaluation of current treatment plan related to hypertension self management and patient's adherence to plan as established by provider Reviewed medications with patient and discussed importance of compliance Discussed plans with patient for ongoing care management follow up and provided patient with direct contact  information for care management team Advised patient, providing education and rationale, to monitor blood pressure daily and record, calling PCP for findings outside established parameters Reviewed scheduled/upcoming provider appointments including:  Advised patient to discuss medication  with provider Assessed social determinant of health barriers  Pain Interventions:  (Status:  New goal.) Long Term Goal Pain assessment performed Medications reviewed Reviewed provider established plan for pain management Discussed importance of adherence to all scheduled medical appointments Counseled on the importance of reporting any/all new or changed pain symptoms or management strategies to pain management provider Advised patient to report to care team affect of pain on daily activities Discussed use of relaxation techniques and/or diversional activities to assist with pain reduction (distraction, imagery, relaxation, massage, acupressure, TENS, heat, and cold application Reviewed with  patient prescribed pharmacological and nonpharmacological pain relief strategies Assessed social determinant of health barriers  Patient Goals/Self-Care Activities: Take all medications as prescribed Attend all scheduled provider appointments Call pharmacy for medication refills 3-7 days in advance of running out of medications Perform all self care activities independently  Perform IADL's (shopping, preparing meals, housekeeping, managing finances) independently Call provider office for new concerns or questions  Patient to follow up with provider regarding cough, BP medication, RLS-completed  Follow Up Plan:  The patient has been provided with contact information for the care management team and has been advised to call with any health related questions or concerns.  The care management team will reach out to the patient again over the next 30 business  days.

## 2023-03-06 NOTE — Patient Outreach (Signed)
Medicaid Managed Care   Nurse Care Manager Note  03/06/2023 Name:  Diana [name] MRN:  [id number] DOB:  01/27/1956  Diana [name] is an 67 y.o. year old female who is a primary patient of Diana Dus, MD.  The Medicaid Managed Care Coordination team was consulted for assistance with:    Chronic healthcare management needs,  CAD, HTN, HLD, COPD, CKD Stage 2, Gout, Asthma, and syncope, chronic pain, RLS, migraines, IBS, vaping   [name] was given information about Medicaid Managed Care Coordination team services today. Diana [name] Patient agreed to services and verbal consent obtained.  Engaged with patient by telephone for follow up visit in response to provider referral for case management and/or care coordination services.   Assessments/Interventions:  Review of past medical history, allergies, medications, health status, including review of consultants reports, laboratory and other test data, was performed as part of comprehensive evaluation and provision of chronic care management services.  SDOH (Social Determinants of Health) assessments and interventions performed: SDOH Interventions    Flowsheet Row Patient Outreach Telephone from 03/06/2023 in [place] POPULATION [facility]ALTH DEPARTMENT Patient Outreach Telephone from 02/04/2023 in [place] POPULATION HEALTH DEPARTMENT Patient Outreach Telephone from 01/06/2023 in [place] POPULATION HEALTH DEPARTMENT Patient Outreach Telephone from 12/10/2022 in [place] POPULATION HEALTH DEPARTMENT  SDOH Interventions      Food Insecurity Interventions -- -- Intervention Not Indicated --  Housing Interventions Intervention Not Indicated -- Intervention Not Indicated --  Transportation Interventions -- -- -- Intervention Not Indicated  Utilities Interventions -- Intervention Not Indicated -- --  Alcohol Usage Interventions -- Intervention Not Indicated (Score <7) -- --     Care Plan  Allergies  Allergen Reactions   Penicillins Anaphylaxis    Demerol [Meperidine] Nausea And Vomiting   Doxycycline Hyclate Nausea And Vomiting   Gabapentin Other (See Comments)    Malaise   Sulfa Antibiotics Hives   Atorvastatin     myalgias   Fluvastatin     myalgia   Levaquin [Levofloxacin]     Reported some possible facial swelling with administration 05/2018   Lovastatin     myalgia   No Known Allergies     Other reaction(s): Dizziness   Pitavastatin     myalgia   Pravastatin     myalgia   Rosuvastatin     myalgia   Simvastatin     myalgia   Benadryl [Diphenhydramine Hcl] Palpitations and Rash   Buprenorphine Hcl Nausea And Vomiting   Cephalexin Rash   Morphine And Related Nausea And Vomiting   Spiriva Handihaler [Tiotropium Bromide Monohydrate] Rash   Medications Reviewed Today     Reviewed by Diana Chandler, RN (Registered Nurse) on 03/06/23 at 1452  Med List Status: <None>   Medication Order Taking? Sig Documenting Provider Last Dose Status Informant  albuterol (VENTOLIN HFA) 108 (90 Base) MCG/ACT inhaler [id number] No Inhale 2 puffs into the lungs every 4 (four) hours as needed for wheezing or shortness of breath. [name], [name], DO Taking Active   Alirocumab (PRALUENT) 150 MG/ML SOAJ [id number] No Inject 150 mg into the skin every 14 (fourteen) days. [name], DO Taking Active   allopurinol (ZYLOPRIM) 100 MG tablet [id number] No Take 1 tablet (100 mg total) by mouth daily. Diana Dus, MD Taking Active   AMITIZA 24 MCG capsule [id number] No Take 24 mcg by mouth daily. [provider] Taking Active   amLODipine (NORVASC) 2.5 MG tablet [id number]  Take 2 tablets (5 mg total)  by mouth daily. [name], DO  Active   aspirin EC 325 MG tablet [id number] No Take 325 mg by mouth daily. [provider] Taking Active   aspirin-acetaminophen-caffeine (EXCEDRIN MIGRAINE) 213 469 1501 MG tablet [id number] No Take 2 tablets by mouth every 6 (six) hours as needed for headache. [provider] Taking Active    azelastine (ASTELIN) 0.1 % nasal spray [id number] No Place into the nose in the morning and at bedtime. [provider] Taking Active   azithromycin (ZITHROMAX Z-PAK) 250 MG tablet [id number] No Take 2 tablets on day 1 and then follow with 1 tablet daily for 4 more days [name], [name], DO Taking Active   clopidogrel (PLAVIX) 75 MG tablet [id number] No Take 75 mg by mouth daily. [provider] Taking Active   co-enzyme Q-10 30 MG capsule [id number] No Take 100 mg by mouth daily. [provider] Taking Active   Cyanocobalamin (B-12) 3000 MCG CAPS [id number] No Take 1 capsule by mouth daily. [provider] Taking Active   diclofenac sodium (VOLTAREN) 1 % GEL [id number] No Apply 2 g topically 4 (four) times daily as needed. Shon Hale, MD Taking Active   esomeprazole (NEXIUM) 40 MG capsule [id number] No TAKE 1 CAPSULE BY MOUTH DAILY AT 12 [name], [name], MD Taking Active   famotidine (PEPCID) 20 MG tablet [id number] No One after supper [name], MD Taking Active   HYDROcodone-acetaminophen (NORCO/VICODIN) 5-325 MG tablet [id number] No Take 1 tablet by mouth every 8 (eight) hours as needed. [provider] Taking Active   lidocaine (HM LIDOCAINE PATCH) 4 % [id number] No Place 1 patch onto the skin daily. Diana Dus, MD Taking Active   linaclotide [facility]) 145 MCG CAPS capsule [id number] No TAKE 1 CAPSULE BY MOUTH DAILY BEFORE Erskine Speed, MD Taking Active   naloxone [facility]) nasal spray 4 mg/0.1 mL [id number] No Place into the nose as needed. overdose [provider] Taking Active   olmesartan (BENICAR) 5 MG tablet [id number]  Take 1 tablet (5 mg total) by mouth daily. Cheree Ditto, [facility]  Active   Pitavastatin Calcium 1 MG TABS [id number] No Take 1 tablet (1 mg total) by mouth 2 (two) times a week. [name], [name], DO Taking Active   SUMAtriptan (IMITREX) 50 MG tablet [id number] No Take 1 tablet (50 mg total) by mouth  every 2 (two) hours as needed for migraine. May repeat in 2 hours if headache persists or recurs. Diana Dus, MD Taking Active   Turmeric 500 MG CAPS [id number] No Take 2 tablets by mouth daily. [provider] Taking Active   venlafaxine XR (EFFEXOR-XR) 37.5 MG 24 hr capsule [id number] No Take 1 capsule (37.5 mg total) by mouth daily with breakfast. 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 12/26/2021   Encounter for hearing screening with abnormal findings 06/14/2021   Rotator cuff tear, non-traumatic, right 05/21/2021   Lung nodule 09/19/2020   Dry eye syndrome of both eyes 01/15/2020   Memory change 02/07/2019   Tobacco abuse 12/08/2017   Chronic obstructive pulmonary disease 03/04/2017   Postmenopausal  bone loss 05/29/2014   Migraine aura, persistent, intractable 05/24/2014   CKD (chronic kidney disease), 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   Atrophic vaginitis 08/28/2012   Irritable bowel syndrome (IBS) 06/07/2012   Restless leg syndrome 05/28/2012   Anxiety 05/28/2012   Hypertension, essential, benign 05/28/2012   Hyperlipidemia 05/28/2012   Barrett's esophagus 05/20/2012   Conditions to be addressed/monitored per PCP order:  CAD, HTN, HLD, COPD, CKD Stage 2, Gout, Asthma, and syncope, chronic pain, RLS, migraines, IBS, vaping   Care Plan : RN Care Manager Plan of Care  Updates made by Diana Chandlerraft, Merci Walthers G, RN since 03/06/2023 12:00 AM     Problem: Health Promotion or Disease Self-Management (General Plan of Care)      Goal: Establish Plan of Care for Chronic Disease Management Needs    Priority: High  Note:   Timeframe:  Long-Range Goal Priority:  High Start Date:      12/10/22                       Expected End Date:    ongoing                  Follow Up Date 04/06/23   - schedule appointment for flu shot - schedule appointment for vaccines needed due to my age or health - schedule recommended health tests (blood work, mammogram, colonoscopy, pap test) - schedule and keep appointment for annual check-up    Why is this important?   Screening tests can find diseases early when they are easier to treat.  Your doctor or nurse will talk with you about which tests are important for you.  Getting shots for common diseases like the flu and shingles will help prevent them. 03/06/23:  Upcoming appts with pain management, ear, and vascular   Long-Range Goal: Chronic Disease Management   Start Date: 12/10/2022  Expected End Date: 06/05/2023  Priority: High  Note:   .Current Barriers:  Knowledge Deficits related to plan of care for management of CAD, HTN, HLD, COPD, CKD Stage 2, Gout, Asthma, and syncope, chronic pain, RLS, migraines, IBS, vaping  Chronic Disease Management support and education needs related to CAD, HTN, HLD, COPD, CKD Stage 2, Gout, Asthma, and syncope, chronic pain, RLS, migraines, IBS, vaping 03/06/23:  Patient leaving on cruise Sunday for 5 days.  Patient remains orthostatic and vaso vagal-tilt table testing done.  Patient with labile BP.  Patient up to date on all appts.  Denies chest pain, SOB-vapes every day.  Right sided pain is less-CT neg.  RNCM Clinical Goal(s):  Patient will verbalize understanding of plan for management of CAD, HTN, HLD, COPD, CKD Stage 2, gout, Asthma, and syncope, chronic pain, RLS, migraines, IBS,vaping as evidenced by patient report and taking all medications verbalize basic understanding of  CAD, HTN, HLD, COPD, CKD Stage 2, Gout, Asthma, and syncope, chronic pain, RLS, migraines,IBS,  vaping  disease process and self health  management plan as evidenced by patient report and taking all medications take all medications exactly as prescribed and will call provider for medication related questions as evidenced by patient report demonstrate understanding of rationale for each prescribed medication as evidenced by patient report attend all scheduled medical appointments as evidenced by patient report demonstrate Improved adherence to prescribed treatment plan for CAD, HTN, HLD, COPD, CKD Stage 2, Gout, Asthma, and syncope, chronic pain, RLS, migraines, IBS, vaping  as evidenced by patient report and taking all medications continue to work with RN Care  Manager to address care management and care coordination needs related to  CAD, HTN, HLD, COPD, CKD Stage 2, Gout, and Asthma, syncope, chronic pain, RLS, migraines, IBS, vaping as evidenced by adherence to CM Team Scheduled appointments through collaboration with RN Care manager, provider, and care team.   Interventions: Inter-disciplinary care team collaboration (see longitudinal plan of care) Evaluation of current treatment plan related to  self management and patient's adherence to plan as established by provider  Asthma: (Status:New goal.) Long Term Goal Provided education about and advised patient to utilize infection prevention strategies to reduce risk of respiratory infection Discussed the importance of adequate rest and management of fatigue with Asthma Assessed social determinant of health barriers   CAD Interventions: (Status:  New goal.) Long Term Goal Assessed understanding of CAD diagnosis Medications reviewed including medications utilized in CAD treatment plan Reviewed Importance of taking all medications as prescribed Reviewed Importance of attending all scheduled provider appointments Advised patient to discuss medications with provider Assessed social determinant of health barriers   Chronic Kidney Disease Interventions:  (Status:  New goal.) Long  Term Goal Evaluation of current treatment plan related to chronic kidney disease self management and patient's adherence to plan as established by provider      Reviewed medications with patient and discussed importance of compliance    Advised patient, providing education and rationale, to monitor blood pressure daily and record, calling PCP for findings outside established parameters    Reviewed scheduled/upcoming provider appointments including    Discussed plans with patient for ongoing care management follow up and provided patient with direct contact information for care management team    Assessed social determinant of health barriers    Last practice recorded BP readings:  BP Readings from Last 3 Encounters:  11/19/22 98/60  11/10/22 (!) 146/73  11/10/22 (!) 140/84  01/26/23         129/84 Most recent eGFR/CrCl: No results found for: "EGFR"  No components found for: "CRCL"  COPD Interventions:  (Status:  New goal.) Long Term Goal Provided education about and advised patient to utilize infection prevention strategies to reduce risk of respiratory infection Discussed the importance of adequate rest and management of fatigue with COPD Assessed social determinant of health barriers  Hyperlipidemia Interventions:  (Status:  New goal.) Long Term Goal Medication review performed; medication list updated in electronic medical record.  Assessed social determinant of health barriers   Hypertension Interventions:  (Status:  New goal.) Long Term Goal Last practice recorded BP readings:  BP Readings from Last 3 Encounters:  11/19/22 98/60  11/10/22 (!) 146/73  11/10/22 (!) 140/84  01/26/23         129/84 03/06/23:           135-165/76-93 Most recent eGFR/CrCl: No results found for: "EGFR"  No components found for: "CRCL"  Evaluation of current treatment plan related to hypertension self management and patient's adherence to plan as established by provider Reviewed medications with patient  and discussed importance of compliance Discussed plans with patient for ongoing care management follow up and provided patient with direct contact information for care management team Advised patient, providing education and rationale, to monitor blood pressure daily and record, calling PCP for findings outside established parameters Reviewed scheduled/upcoming provider appointments including:  Advised patient to discuss medication  with provider Assessed social determinant of health barriers  Pain Interventions:  (Status:  New goal.) Long Term Goal Pain assessment performed Medications reviewed Reviewed provider established plan for pain management  Discussed importance of adherence to all scheduled medical appointments Counseled on the importance of reporting any/all new or changed pain symptoms or management strategies to pain management provider Advised patient to report to care team affect of pain on daily activities Discussed use of relaxation techniques and/or diversional activities to assist with pain reduction (distraction, imagery, relaxation, massage, acupressure, TENS, heat, and cold application Reviewed with patient prescribed pharmacological and nonpharmacological pain relief strategies Assessed social determinant of health barriers  Patient Goals/Self-Care Activities: Take all medications as prescribed Attend all scheduled provider appointments Call pharmacy for medication refills 3-7 days in advance of running out of medications Perform all self care activities independently  Perform IADL's (shopping, preparing meals, housekeeping, managing finances) independently Call provider office for new concerns or questions  Patient to follow up with provider regarding cough, BP medication, RLS-completed  Follow Up Plan:  The patient has been provided with contact information for the care management team and has been advised to call with any health related questions or concerns.  The  care management team will reach out to the patient again over the next 30 business  days.    Follow Up:  Patient agrees to Care Plan and Follow-up.  Plan: The Managed Medicaid care management team will reach out to the patient again over the next 30 business  days. and The  Patient has been provided with contact information for the Managed Medicaid care management team and has been advised to call with any health related questions or concerns.  Date/time of next scheduled RN care management/care coordination outreach: 04/06/23 at 230.

## 2023-03-09 ENCOUNTER — Other Ambulatory Visit: Payer: Self-pay | Admitting: Family Medicine

## 2023-03-09 DIAGNOSIS — F419 Anxiety disorder, unspecified: Secondary | ICD-10-CM

## 2023-03-09 DIAGNOSIS — G43519 Persistent migraine aura without cerebral infarction, intractable, without status migrainosus: Secondary | ICD-10-CM

## 2023-03-17 ENCOUNTER — Telehealth: Payer: Self-pay | Admitting: Cardiology

## 2023-03-17 MED ORDER — OLMESARTAN MEDOXOMIL 5 MG PO TABS: 5.0000 mg | ORAL_TABLET | Freq: Two times a day (BID) | ORAL | 6 refills | Status: DC

## 2023-03-17 NOTE — Telephone Encounter (Signed)
Pt c/o BP issue: STAT if pt c/o blurred vision, one-sided weakness or slurred speech  1. What are your last 5 BP readings?  04/08 - 145/82 HR 79, 04/09 - 145/83 HR 83, 04/10 - 138/89 HR 86, 04/11 - 165/93 HR 86, 04/12 - 140/76 HR 78, 04/13 - 149/83 HR 85, (Pt did not give reading for 04/14), 04/15 - 151/87 HR 82, 04/16 - 168/96 HR 81     2. Are you having any other symptoms (ex. Dizziness, headache, blurred vision, passed out)? Headaches and dizziness   3. What is your BP issue?  Patient stated that their BP is still running high. Patient did state that she had a table tilt test done on 03/05/23 at [facility]. The notes from the table tilt test has been sent over to [place].

## 2023-03-17 NOTE — Telephone Encounter (Signed)
Spoke with pt, she reports the blood pressure readings given were taken prior to taking her olmesartan. Her amlodipine was recently stopped due to being orthostatic. She reports the dizziness she gets can happen when turning her head and that is why they did the tilt tablet testing to make sure it was not vertigo. Aware her blood pressure may need to run a little higher to prevent orthostatics.  Will forward for dr tobb to review.

## 2023-03-17 NOTE — Telephone Encounter (Signed)
Spoke with pt, aware of dr tobb's recommendations. 

## 2023-03-20 DIAGNOSIS — I6523 Occlusion and stenosis of bilateral carotid arteries: Secondary | ICD-10-CM | POA: Diagnosis not present

## 2023-03-26 DIAGNOSIS — M5416 Radiculopathy, lumbar region: Secondary | ICD-10-CM | POA: Diagnosis not present

## 2023-03-26 DIAGNOSIS — Z79891 Long term (current) use of opiate analgesic: Secondary | ICD-10-CM | POA: Diagnosis not present

## 2023-03-26 DIAGNOSIS — R413 Other amnesia: Secondary | ICD-10-CM | POA: Diagnosis not present

## 2023-03-26 DIAGNOSIS — G894 Chronic pain syndrome: Secondary | ICD-10-CM | POA: Diagnosis not present

## 2023-03-26 DIAGNOSIS — K5903 Drug induced constipation: Secondary | ICD-10-CM | POA: Diagnosis not present

## 2023-03-30 DIAGNOSIS — Z7982 Long term (current) use of aspirin: Secondary | ICD-10-CM | POA: Diagnosis not present

## 2023-03-30 DIAGNOSIS — H8112 Benign paroxysmal vertigo, left ear: Secondary | ICD-10-CM | POA: Diagnosis not present

## 2023-03-30 DIAGNOSIS — Z79899 Other long term (current) drug therapy: Secondary | ICD-10-CM | POA: Diagnosis not present

## 2023-04-02 ENCOUNTER — Telehealth: Payer: Self-pay | Admitting: Cardiology

## 2023-04-02 DIAGNOSIS — N182 Chronic kidney disease, stage 2 (mild): Secondary | ICD-10-CM

## 2023-04-02 DIAGNOSIS — I10 Essential (primary) hypertension: Secondary | ICD-10-CM

## 2023-04-02 MED ORDER — OLMESARTAN MEDOXOMIL 5 MG PO TABS: 10.0000 mg | ORAL_TABLET | Freq: Two times a day (BID) | ORAL | 6 refills | Status: DC

## 2023-04-02 NOTE — Telephone Encounter (Signed)
BP Readings w/ HR:             Morn.           Even. 4/17 - 149/86; 94;   75/48; 79 4/18 - 172/94; 83;   167/81; 88 4/19 - 148/93; 78;   162/86; 90 4/20 - 142/84; 91;   153/83; 87 4/21 - 108/71; 100; 156/84; 83 4/22 - 143/84; 84;   150/77; 98 4/23 - 107/64; 86;   155/84; 87 4/24 - 133/80; 83;    N/A 4/25 - 127/78; 82;   154/83; 86 4/26 - 145/88; 84;   162/85; 86 4/27 - 148/86; 91;   153/83; 86 4/28 - 175/94; 92;   167/82; 83 4/29 - 156/89; 83;   142/86; 91 4/30 - 140/78; 84;    N/A 5/1 - 149/80; 85;     155/86;84 5/2 - 167/77; 81

## 2023-04-02 NOTE — Telephone Encounter (Signed)
Patent called regarding medication, she states it was not at pharmacy.  Verified that med sent but advised to give time for pharmacy to pull from system. Lab order signed and sent.  Patient aware of medication increase.   She also ask to set an appt. Advised she could call but would send to scheduling.  See she has appt. Already. LVM if this is for a lab appt. None needed.  Left date and time of her already scheduled appt.

## 2023-04-02 NOTE — Telephone Encounter (Signed)
Patient is following up states that she is out of Olmesartan and requesting that the current prescription be updated--patient states that she will need to take two 5 MG tablets twice daily instead of 10 MG as the pharmacy only carries 5 MG tablets.

## 2023-04-06 ENCOUNTER — Other Ambulatory Visit: Payer: Medicaid Other | Admitting: Obstetrics and Gynecology

## 2023-04-06 ENCOUNTER — Encounter: Payer: Self-pay | Admitting: Obstetrics and Gynecology

## 2023-04-06 NOTE — Patient Outreach (Signed)
Medicaid Managed Care   Nurse Care Manager Note  04/06/2023 Name:  Diana [name] MRN:  [id number] DOB:  May 08, 1956  Diana [name] is an 67 y.o. year old female who is a primary patient of Diana Dus, MD.  The Medicaid Managed Care Coordination team was consulted for assistance with:    Chronic healthcare management needs, CAD, HTN, HLD, COPD, CKD, gout, asthma, snycope, vertigo, chronic pain, RLS, migraines, IBS  [name] was given information about Medicaid Managed Care Coordination team services today. Diana [name] Patient agreed to services and verbal consent obtained.  Engaged with patient by telephone for follow up visit in response to provider referral for case management and/or care coordination services.   Assessments/Interventions:  Review of past medical history, allergies, medications, health status, including review of consultants reports, laboratory and other test data, was performed as part of comprehensive evaluation and provision of chronic care management services.  SDOH (Social Determinants of Health) assessments and interventions performed: SDOH Interventions    Flowsheet Row Patient Outreach Telephone from 04/06/2023 in [place] POPULATION HEALTH DEPARTMENT Patient Outreach Telephone from 03/06/2023 in [place] POPULATION HEALTH DEPARTMENT Patient Outreach Telephone from 02/04/2023 in [place] POPULATION HEALTH DEPARTMENT Patient Outreach Telephone from 01/06/2023 in [place] POPULATION HEALTH DEPARTMENT Patient Outreach Telephone from 12/10/2022 in [place] POPULATION HEALTH DEPARTMENT  SDOH Interventions       Food Insecurity Interventions -- -- -- Intervention Not Indicated --  Housing Interventions -- Intervention Not Indicated -- Intervention Not Indicated --  Transportation Interventions -- -- -- -- Intervention Not Indicated  Utilities Interventions -- -- Intervention Not Indicated -- --  Alcohol Usage Interventions -- -- Intervention Not Indicated (Score <7)  -- --  Financial Strain Interventions Intervention Not Indicated -- -- -- --  Physical Activity Interventions --  [declined] -- -- -- --     Care Plan  Allergies  Allergen Reactions   Penicillins Anaphylaxis   Demerol [Meperidine] Nausea And Vomiting   Doxycycline Hyclate Nausea And Vomiting   Gabapentin Other (See Comments)    Malaise   Sulfa Antibiotics Hives   Atorvastatin     myalgias   Fluvastatin     myalgia   Levaquin [Levofloxacin]     Reported some possible facial swelling with administration 05/2018   Lovastatin     myalgia   No Known Allergies     Other reaction(s): Dizziness   Pitavastatin     myalgia   Pravastatin     myalgia   Rosuvastatin     myalgia   Simvastatin     myalgia   Benadryl [Diphenhydramine Hcl] Palpitations and Rash   Buprenorphine Hcl Nausea And Vomiting   Cephalexin Rash   Morphine And Related Nausea And Vomiting   Spiriva Handihaler [Tiotropium Bromide Monohydrate] Rash   Medications Reviewed Today     Reviewed by Diana Chandler, RN (Registered Nurse) on 04/06/23 at 1438  Med List Status: <None>   Medication Order Taking? Sig Documenting Provider Last Dose Status Informant  albuterol (VENTOLIN HFA) 108 (90 Base) MCG/ACT inhaler [id number] No Inhale 2 puffs into the lungs every 4 (four) hours as needed for wheezing or shortness of breath. [name], [name], DO Taking Active   Alirocumab (PRALUENT) 150 MG/ML SOAJ [id number] No Inject 150 mg into the skin every 14 (fourteen) days. Diana Ripple, DO Taking Active   allopurinol (ZYLOPRIM) 100 MG tablet [id number] No Take 1 tablet (100 mg total) by mouth daily. Diana Dus, MD Taking Active  AMITIZA 24 MCG capsule [id number] No Take 24 mcg by mouth daily. [provider] Taking Active   aspirin EC 325 MG tablet [id number] No Take 325 mg by mouth daily. [provider] Taking Active   aspirin-acetaminophen-caffeine (EXCEDRIN MIGRAINE) [phone] MG tablet [id number] No Take 2  tablets by mouth every 6 (six) hours as needed for headache. [provider] Taking Active   azelastine (ASTELIN) 0.1 % nasal spray [id number] No Place into the nose in the morning and at bedtime. [provider] Taking Active   azithromycin (ZITHROMAX Z-PAK) 250 MG tablet [id number] No Take 2 tablets on day 1 and then follow with 1 tablet daily for 4 more days [name], [name], DO Taking Active   clopidogrel (PLAVIX) 75 MG tablet [id number] No Take 75 mg by mouth daily. [provider] Taking Active   co-enzyme Q-10 30 MG capsule [id number] No Take 100 mg by mouth daily. [provider] Taking Active   Cyanocobalamin (B-12) 3000 MCG CAPS [id number] No Take 1 capsule by mouth daily. [provider] Taking Active   diclofenac sodium (VOLTAREN) 1 % GEL [id number] No Apply 2 g topically 4 (four) times daily as needed. Diana Hale, MD Taking Active   esomeprazole (NEXIUM) 40 MG capsule [id number] No TAKE 1 CAPSULE BY MOUTH DAILY AT 12 [name], Diana Schneiders, MD Taking Active   famotidine (PEPCID) 20 MG tablet [id number] No One after supper Diana Cowden, MD Taking Active   HYDROcodone-acetaminophen (NORCO/VICODIN) 5-325 MG tablet [id number] No Take 1 tablet by mouth every 8 (eight) hours as needed. [provider] Taking Active   lidocaine (HM LIDOCAINE PATCH) 4 % [id number] No Place 1 patch onto the skin daily. Diana Dus, MD Taking Active   linaclotide [facility]) 145 MCG CAPS capsule [id number] No TAKE 1 CAPSULE BY MOUTH DAILY BEFORE Erskine Speed, MD Taking Active   naloxone [facility]) nasal spray 4 mg/0.1 mL [id number] No Place into the nose as needed. overdose [provider] Taking Active   olmesartan (BENICAR) 5 MG tablet [id number]  Take 2 tablets (10 mg total) by mouth 2 (two) times daily. Diana Ripple, DO  Active Self  Pitavastatin Calcium 1 MG TABS [id number] No Take 1 tablet (1 mg total) by mouth 2 (two) times a week. Diana,  [name], DO Taking Active   SUMAtriptan (IMITREX) 50 MG tablet [id number]  Take 1 tablet by mouth every 2 hours as needed for migraine. May repeat in 2 hours if headache persists or recurs. Diana Dus, MD  Active   Turmeric 500 MG CAPS [id number] No Take 2 tablets by mouth daily. [provider] Taking Active   venlafaxine XR (EFFEXOR-XR) 37.5 MG 24 hr capsule [id number]  Take 1 capsule by mouth daily with breakfast. 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 12/26/2021   Encounter for hearing screening with abnormal findings 06/14/2021   Rotator cuff tear, non-traumatic, right 05/21/2021   Lung nodule 09/19/2020   Dry eye syndrome of both eyes 01/15/2020   Memory change 02/07/2019   Tobacco abuse 12/08/2017   Chronic obstructive pulmonary  disease (HCC) 03/04/2017   Postmenopausal bone loss 05/29/2014   Migraine aura, persistent, intractable 05/24/2014   CKD (chronic kidney disease), 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   Atrophic vaginitis 08/28/2012   Irritable bowel syndrome (IBS) 06/07/2012   Restless leg syndrome 05/28/2012   Anxiety 05/28/2012   Hypertension, essential, benign 05/28/2012   Hyperlipidemia 05/28/2012   Barrett's esophagus 05/20/2012   Conditions to be addressed/monitored per PCP order:  Chronic healthcare management needs, CAD, HTN, HLD, COPD, CKD, gout, asthma, snycope, vertigo, chronic pain, RLS, migraines, IBS  Care Plan : RN Care Manager Plan of Care  Updates made by Diana Chandler, RN since 04/06/2023 12:00 AM     Problem: Health Promotion or Disease Self-Management  (General Plan of Care)      Goal: Establish Plan of Care for Chronic Disease Management Needs   Priority: High  Note:   Timeframe:  Long-Range Goal Priority:  High Start Date:      12/10/22                       Expected End Date:    ongoing                  Follow Up Date 06/05/23   - schedule appointment for flu shot - schedule appointment for vaccines needed due to my age or health - schedule recommended health tests (blood work, mammogram, colonoscopy, pap test) - schedule and keep appointment for annual check-up    Why is this important?   Screening tests can find diseases early when they are easier to treat.  Your doctor or nurse will talk with you about which tests are important for you.  Getting shots for common diseases like the flu and shingles will help prevent them. 04/06/23:  Up to date on all appts.     Long-Range Goal: Chronic Disease Management   Start Date: 12/10/2022  Expected End Date: 06/05/2023  Priority: High  Note:   .Current Barriers:  Knowledge Deficits related to plan of care for management of CAD, HTN, HLD, COPD, CKD Stage 2, Gout, Asthma, and syncope, chronic pain, RLS, migraines, IBS, vaping  Chronic Disease Management support and education needs related to CAD, HTN, HLD, COPD, CKD Stage 2, Gout, Asthma, and syncope, chronic pain, RLS, migraines, IBS, vaping 04/06/23:  patient with no complaints today-labile BP-medication adjustment.  Evaluated by ENT-vertigo, vapes qd  RNCM Clinical Goal(s):  Patient will verbalize understanding of plan for management of CAD, HTN, HLD, COPD, CKD Stage 2, gout, Asthma, and syncope, chronic pain, RLS, migraines, IBS,vaping as evidenced by patient report and taking all medications verbalize basic understanding of  CAD, HTN, HLD, COPD, CKD Stage 2, Gout, Asthma, and syncope, chronic pain, RLS, migraines,IBS,  vaping  disease process and self health management plan as evidenced by patient report and taking all medications take  all medications exactly as prescribed and will call provider for medication related questions as evidenced by patient report demonstrate understanding of rationale for each prescribed medication as evidenced by patient report attend all scheduled medical appointments as evidenced by patient report demonstrate Improved adherence to prescribed treatment plan for CAD, HTN, HLD, COPD, CKD Stage 2, Gout, Asthma, and syncope, chronic pain, RLS, migraines, IBS, vaping  as evidenced by patient report and taking all medications continue to work with RN Care Manager to address care management and care coordination needs related to  CAD, HTN, HLD, COPD, CKD Stage 2, Gout, and Asthma, syncope, chronic pain, RLS,  migraines, IBS, vaping as evidenced by adherence to CM Team Scheduled appointments through collaboration with RN Care manager, provider, and care team.   Interventions: Inter-disciplinary care team collaboration (see longitudinal plan of care) Evaluation of current treatment plan related to  self management and patient's adherence to plan as established by provider  Asthma: (Status:New goal.) Long Term Goal Provided education about and advised patient to utilize infection prevention strategies to reduce risk of respiratory infection Discussed the importance of adequate rest and management of fatigue with Asthma Assessed social determinant of health barriers   CAD Interventions: (Status:  New goal.) Long Term Goal Assessed understanding of CAD diagnosis Medications reviewed including medications utilized in CAD treatment plan Reviewed Importance of taking all medications as prescribed Reviewed Importance of attending all scheduled provider appointments Advised patient to discuss medications with provider Assessed social determinant of health barriers   Chronic Kidney Disease Interventions:  (Status:  New goal.) Long Term Goal Evaluation of current treatment plan related to chronic kidney disease  self management and patient's adherence to plan as established by provider      Reviewed medications with patient and discussed importance of compliance    Advised patient, providing education and rationale, to monitor blood pressure daily and record, calling PCP for findings outside established parameters    Reviewed scheduled/upcoming provider appointments including    Discussed plans with patient for ongoing care management follow up and provided patient with direct contact information for care management team    Assessed social determinant of health barriers    Last practice recorded BP readings:  BP Readings from Last 3 Encounters:  11/19/22 98/60  11/10/22 (!) 146/73  11/10/22 (!) 140/84  01/26/23         129/84  Most recent eGFR/CrCl: No results found for: "EGFR"  No components found for: "CRCL"  COPD Interventions:  (Status:  New goal.) Long Term Goal Provided education about and advised patient to utilize infection prevention strategies to reduce risk of respiratory infection Discussed the importance of adequate rest and management of fatigue with COPD Assessed social determinant of health barriers  Hyperlipidemia Interventions:  (Status:  New goal.) Long Term Goal Medication review performed; medication list updated in electronic medical record.  Assessed social determinant of health barriers   Hypertension Interventions:  (Status:  New goal.) Long Term Goal Last practice recorded BP readings:  BP Readings from Last 3 Encounters:  11/19/22 98/60  11/10/22 (!) 146/73  11/10/22 (!) 140/84  01/26/23         129/84 03/06/23:           135-165/76-93 04/06/23            129-167/71-82  Most recent eGFR/CrCl: No results found for: "EGFR"  No components found for: "CRCL"  Evaluation of current treatment plan related to hypertension self management and patient's adherence to plan as established by provider Reviewed medications with patient and discussed importance of  compliance Discussed plans with patient for ongoing care management follow up and provided patient with direct contact information for care management team Advised patient, providing education and rationale, to monitor blood pressure daily and record, calling PCP for findings outside established parameters Reviewed scheduled/upcoming provider appointments including:  Advised patient to discuss medication  with provider Assessed social determinant of health barriers  Pain Interventions:  (Status:  New goal.) Long Term Goal Pain assessment performed Medications reviewed Reviewed provider established plan for pain management Discussed importance of adherence to all scheduled medical appointments Counseled on  the importance of reporting any/all new or changed pain symptoms or management strategies to pain management provider Advised patient to report to care team affect of pain on daily activities Discussed use of relaxation techniques and/or diversional activities to assist with pain reduction (distraction, imagery, relaxation, massage, acupressure, TENS, heat, and cold application Reviewed with patient prescribed pharmacological and nonpharmacological pain relief strategies Assessed social determinant of health barriers  Patient Goals/Self-Care Activities: Take all medications as prescribed Attend all scheduled provider appointments Call pharmacy for medication refills 3-7 days in advance of running out of medications Perform all self care activities independently  Perform IADL's (shopping, preparing meals, housekeeping, managing finances) independently Call provider office for new concerns or questions  Patient to follow up with provider regarding cough, BP medication, RLS-completed  Follow Up Plan:  The patient has been provided with contact information for the care management team and has been advised to call with any health related questions or concerns.  The care management team will  reach out to the patient again over the next 30 business  days.     Follow Up:  Patient agrees to Care Plan and Follow-up.  Plan: The Managed Medicaid care management team will reach out to the patient again over the next 30 business  days. and The  Patient has been provided with contact information for the Managed Medicaid care management team and has been advised to call with any health related questions or concerns.  Date/time of next scheduled RN care management/care coordination outreach:  06/05/23 at 0900.

## 2023-04-06 NOTE — Patient Instructions (Signed)
Hi Diana [name], it was a pleasure speaking with you today, have a great week!!  Diana [name] was given information about Medicaid Managed Care team care coordination services as a part of their Healthy [facility] Medicaid benefit. Diana [name] verbally consented to engagement with the [facility] Managed Care team.   If you are experiencing a medical emergency, please call 911 or report to your local emergency department or urgent care.   If you have a non-emergency medical problem during routine business hours, please contact your provider's office and ask to speak with a nurse.   For questions related to your Healthy [facility] health plan, please call: [phone] or visit the homepage here: [email or web address]  If you would like to schedule transportation through your Healthy [facility] plan, please call the following number at least 2 days in advance of your appointment: [phone]  For information about your ride after you set it up, call Ride Assist at [phone]. Use this number to activate a Will Call pickup, or if your transportation is late for a scheduled pickup. Use this number, too, if you need to make a change or cancel a previously scheduled reservation.  If you need transportation services right away, call [phone]. The after-hours call center is staffed 24 hours to handle ride assistance and urgent reservation requests (including discharges) 365 days a year. Urgent trips include sick visits, hospital discharge requests and life-sustaining treatment.  Call the [facility] Line at [phone], at any time, 24 hours a day, 7 days a week. If you are in danger or need immediate medical attention call 911.  If you would like help to quit smoking, call 1-800-QUIT-NOW ((405)131-0433) OR Espaol: 1-855-Djelo-Ya (4-742-595-6387) o para ms informacin haga clic aqu or Text READY to 564-332 to register via text  Diana [name] - following are  the goals we discussed in your visit today:   Goals Addressed   Timeframe:  Long-Range Goal Priority:  High Start Date:      12/10/22                       Expected End Date:    ongoing                  Follow Up Date 06/05/23   - schedule appointment for flu shot - schedule appointment for vaccines needed due to my age or health - schedule recommended health tests (blood work, mammogram, colonoscopy, pap test) - schedule and keep appointment for annual check-up    Why is this important?   Screening tests can find diseases early when they are easier to treat.  Your doctor or nurse will talk with you about which tests are important for you.  Getting shots for common diseases like the flu and shingles will help prevent them. 04/06/23:  Up to date on all appts.  Patient verbalizes understanding of instructions and care plan provided today and agrees to view in MyChart. Active MyChart status and patient understanding of how to access instructions and care plan via MyChart confirmed with patient.     The Managed Medicaid care management team will reach out to the patient again over the next 60 business  days.  The  Patient has been provided with contact information for the Managed Medicaid care management team and has been advised to call with any health related questions or concerns.   Kathi Der RN, [name] [place]  [employer], production - Managed Medicaid  High Risk [phone]   Following is a copy of your plan of care:  Care Plan : RN Care Manager Plan of Care  Updates made by Danie Chandler, RN since 04/06/2023 12:00 AM   Long-Range Goal: Chronic Disease Management   Start Date: 12/10/2022  Expected End Date: 06/05/2023  Priority: High  Note:   .Current Barriers:  Knowledge Deficits related to plan of care for management of CAD, HTN, HLD, COPD, CKD Stage 2, Gout, Asthma, and syncope, chronic pain, RLS, migraines, IBS, vaping  Chronic Disease Management  support and education needs related to CAD, HTN, HLD, COPD, CKD Stage 2, Gout, Asthma, and syncope, chronic pain, RLS, migraines, IBS, vaping 04/06/23:  patient with no complaints today-labile BP-medication adjustment.  Evaluated by ENT-vertigo, vapes qd  RNCM Clinical Goal(s):  Patient will verbalize understanding of plan for management of CAD, HTN, HLD, COPD, CKD Stage 2, gout, Asthma, and syncope, chronic pain, RLS, migraines, IBS,vaping as evidenced by patient report and taking all medications verbalize basic understanding of  CAD, HTN, HLD, COPD, CKD Stage 2, Gout, Asthma, and syncope, chronic pain, RLS, migraines,IBS,  vaping  disease process and self health management plan as evidenced by patient report and taking all medications take all medications exactly as prescribed and will call provider for medication related questions as evidenced by patient report demonstrate understanding of rationale for each prescribed medication as evidenced by patient report attend all scheduled medical appointments as evidenced by patient report demonstrate Improved adherence to prescribed treatment plan for CAD, HTN, HLD, COPD, CKD Stage 2, Gout, Asthma, and syncope, chronic pain, RLS, migraines, IBS, vaping  as evidenced by patient report and taking all medications continue to work with RN Care Manager to address care management and care coordination needs related to  CAD, HTN, HLD, COPD, CKD Stage 2, Gout, and Asthma, syncope, chronic pain, RLS, migraines, IBS, vaping as evidenced by adherence to CM Team Scheduled appointments through collaboration with RN Care manager, provider, and care team.   Interventions: Inter-disciplinary care team collaboration (see longitudinal plan of care) Evaluation of current treatment plan related to  self management and patient's adherence to plan as established by provider  Asthma: (Status:New goal.) Long Term Goal Provided education about and advised patient to utilize  infection prevention strategies to reduce risk of respiratory infection Discussed the importance of adequate rest and management of fatigue with Asthma Assessed social determinant of health barriers   CAD Interventions: (Status:  New goal.) Long Term Goal Assessed understanding of CAD diagnosis Medications reviewed including medications utilized in CAD treatment plan Reviewed Importance of taking all medications as prescribed Reviewed Importance of attending all scheduled provider appointments Advised patient to discuss medications with provider Assessed social determinant of health barriers   Chronic Kidney Disease Interventions:  (Status:  New goal.) Long Term Goal Evaluation of current treatment plan related to chronic kidney disease self management and patient's adherence to plan as established by provider      Reviewed medications with patient and discussed importance of compliance    Advised patient, providing education and rationale, to monitor blood pressure daily and record, calling PCP for findings outside established parameters    Reviewed scheduled/upcoming provider appointments including    Discussed plans with patient for ongoing care management follow up and provided patient with direct contact information for care management team    Assessed social determinant of health barriers    Last practice recorded BP readings:  BP Readings from Last 3 Encounters:  11/19/22  98/60  11/10/22 (!) 146/73  11/10/22 (!) 140/84  01/26/23         129/84  Most recent eGFR/CrCl: No results found for: "EGFR"  No components found for: "CRCL"  COPD Interventions:  (Status:  New goal.) Long Term Goal Provided education about and advised patient to utilize infection prevention strategies to reduce risk of respiratory infection Discussed the importance of adequate rest and management of fatigue with COPD Assessed social determinant of health barriers  Hyperlipidemia Interventions:  (Status:   New goal.) Long Term Goal Medication review performed; medication list updated in electronic medical record.  Assessed social determinant of health barriers   Hypertension Interventions:  (Status:  New goal.) Long Term Goal Last practice recorded BP readings:  BP Readings from Last 3 Encounters:  11/19/22 98/60  11/10/22 (!) 146/73  11/10/22 (!) 140/84  01/26/23         129/84 03/06/23:           135-165/76-93 04/06/23            129-167/71-82  Most recent eGFR/CrCl: No results found for: "EGFR"  No components found for: "CRCL"  Evaluation of current treatment plan related to hypertension self management and patient's adherence to plan as established by provider Reviewed medications with patient and discussed importance of compliance Discussed plans with patient for ongoing care management follow up and provided patient with direct contact information for care management team Advised patient, providing education and rationale, to monitor blood pressure daily and record, calling PCP for findings outside established parameters Reviewed scheduled/upcoming provider appointments including:  Advised patient to discuss medication  with provider Assessed social determinant of health barriers  Pain Interventions:  (Status:  New goal.) Long Term Goal Pain assessment performed Medications reviewed Reviewed provider established plan for pain management Discussed importance of adherence to all scheduled medical appointments Counseled on the importance of reporting any/all new or changed pain symptoms or management strategies to pain management provider Advised patient to report to care team affect of pain on daily activities Discussed use of relaxation techniques and/or diversional activities to assist with pain reduction (distraction, imagery, relaxation, massage, acupressure, TENS, heat, and cold application Reviewed with patient prescribed pharmacological and nonpharmacological pain relief  strategies Assessed social determinant of health barriers  Patient Goals/Self-Care Activities: Take all medications as prescribed Attend all scheduled provider appointments Call pharmacy for medication refills 3-7 days in advance of running out of medications Perform all self care activities independently  Perform IADL's (shopping, preparing meals, housekeeping, managing finances) independently Call provider office for new concerns or questions  Patient to follow up with provider regarding cough, BP medication, RLS-completed  Follow Up Plan:  The patient has been provided with contact information for the care management team and has been advised to call with any health related questions or concerns.  The care management team will reach out to the patient again over the next 30 business  days.

## 2023-04-23 DIAGNOSIS — K5903 Drug induced constipation: Secondary | ICD-10-CM | POA: Diagnosis not present

## 2023-04-23 DIAGNOSIS — R413 Other amnesia: Secondary | ICD-10-CM | POA: Diagnosis not present

## 2023-04-23 DIAGNOSIS — Z79891 Long term (current) use of opiate analgesic: Secondary | ICD-10-CM | POA: Diagnosis not present

## 2023-04-23 DIAGNOSIS — M5416 Radiculopathy, lumbar region: Secondary | ICD-10-CM | POA: Diagnosis not present

## 2023-04-23 DIAGNOSIS — G894 Chronic pain syndrome: Secondary | ICD-10-CM | POA: Diagnosis not present

## 2023-04-24 DIAGNOSIS — I6381 Other cerebral infarction due to occlusion or stenosis of small artery: Secondary | ICD-10-CM | POA: Diagnosis not present

## 2023-05-03 ENCOUNTER — Other Ambulatory Visit: Payer: Self-pay | Admitting: Family Medicine

## 2023-05-03 DIAGNOSIS — G43519 Persistent migraine aura without cerebral infarction, intractable, without status migrainosus: Secondary | ICD-10-CM

## 2023-05-11 ENCOUNTER — Other Ambulatory Visit: Payer: Self-pay | Admitting: Family Medicine

## 2023-05-11 DIAGNOSIS — M109 Gout, unspecified: Secondary | ICD-10-CM

## 2023-05-14 DIAGNOSIS — R413 Other amnesia: Secondary | ICD-10-CM | POA: Diagnosis not present

## 2023-05-14 DIAGNOSIS — Z79891 Long term (current) use of opiate analgesic: Secondary | ICD-10-CM | POA: Diagnosis not present

## 2023-05-14 DIAGNOSIS — G894 Chronic pain syndrome: Secondary | ICD-10-CM | POA: Diagnosis not present

## 2023-05-14 DIAGNOSIS — M5416 Radiculopathy, lumbar region: Secondary | ICD-10-CM | POA: Diagnosis not present

## 2023-05-14 DIAGNOSIS — K5903 Drug induced constipation: Secondary | ICD-10-CM | POA: Diagnosis not present

## 2023-05-26 ENCOUNTER — Other Ambulatory Visit: Payer: Self-pay | Admitting: Family Medicine

## 2023-06-05 ENCOUNTER — Other Ambulatory Visit: Payer: Medicaid Other | Admitting: Obstetrics and Gynecology

## 2023-06-05 ENCOUNTER — Encounter: Payer: Self-pay | Admitting: Obstetrics and Gynecology

## 2023-06-05 NOTE — Patient Outreach (Signed)
Medicaid Managed Care   Nurse Care Manager Note  06/05/2023 Name:  ORNELLA HARTFIELD MRN:  [id number] DOB:  07-Nov-1956  Diana [name] is an 67 y.o. year old female who is a primary patient of Everhart, Kirstie, DO.  The [facility] Managed Care Coordination team was consulted for assistance with:    Chronic healthcare management needs, CAD, HTN, HLD, COPD, CKD, gout, asthma, syncope, chronic pain, RLS, migraines, IBS  Ms. Moroyoqui was given information about Medicaid Managed Care Coordination team services today. [name] Patient agreed to services and verbal consent obtained.  Engaged with patient by telephone for follow up visit in response to provider referral for case management and/or care coordination services.   Assessments/Interventions:  Review of past medical history, allergies, medications, health status, including review of consultants reports, laboratory and other test data, was performed as part of comprehensive evaluation and provision of chronic care management services.  SDOH (Social Determinants of Health) assessments and interventions performed: SDOH Interventions    Flowsheet Row Patient Outreach Telephone from 06/05/2023 in [place] POPULATION HEALTH DEPARTMENT Patient Outreach Telephone from 04/06/2023 in [place] POPULATION HEALTH DEPARTMENT Patient Outreach Telephone from 03/06/2023 in [place] POPULATION HEALTH DEPARTMENT Patient Outreach Telephone from 02/04/2023 in [place] POPULATION HEALTH DEPARTMENT Patient Outreach Telephone from 01/06/2023 in [place] POPULATION HEALTH DEPARTMENT Patient Outreach Telephone from 12/10/2022 in [place] POPULATION HEALTH DEPARTMENT  SDOH Interventions        Food Insecurity Interventions -- -- -- -- Intervention Not Indicated --  Housing Interventions -- -- Intervention Not Indicated -- Intervention Not Indicated --  Transportation Interventions -- -- -- -- -- Intervention Not Indicated  Utilities Interventions -- -- -- Intervention  Not Indicated -- --  Alcohol Usage Interventions -- -- -- Intervention Not Indicated (Score <7) -- --  Financial Strain Interventions -- Intervention Not Indicated -- -- -- --  Physical Activity Interventions -- --  [declined] -- -- -- --  Stress Interventions Intervention Not Indicated -- -- -- -- --     Care Plan  Allergies  Allergen Reactions   Penicillins Anaphylaxis   Demerol [Meperidine] Nausea And Vomiting   Doxycycline Hyclate Nausea And Vomiting   Gabapentin Other (See Comments)    Malaise   Sulfa Antibiotics Hives   Atorvastatin     myalgias   Fluvastatin     myalgia   Levaquin [Levofloxacin]     Reported some possible facial swelling with administration 05/2018   Lovastatin     myalgia   No Known Allergies     Other reaction(s): Dizziness   Pitavastatin     myalgia   Pravastatin     myalgia   Rosuvastatin     myalgia   Simvastatin     myalgia   Benadryl [Diphenhydramine Hcl] Palpitations and Rash   Buprenorphine Hcl Nausea And Vomiting   Cephalexin Rash   Morphine And Codeine Nausea And Vomiting   Spiriva Handihaler [Tiotropium Bromide Monohydrate] Rash   Medications Reviewed Today     Reviewed by Danie Chandler, RN (Registered Nurse) on 06/05/23 at [phone]  Med List Status: <None>   Medication Order Taking? Sig Documenting Provider Last Dose Status Informant  albuterol (VENTOLIN HFA) 108 (90 Base) MCG/ACT inhaler [id number] No Inhale 2 puffs into the lungs every 4 (four) hours as needed for wheezing or shortness of breath. [name], [name], DO Taking Active   Alirocumab (PRALUENT) 150 MG/ML SOAJ [id number] No Inject 150 mg into the skin every 14 (fourteen)  days. [name], DO Taking Active   allopurinol (ZYLOPRIM) 100 MG tablet [id number]  Take 1 tablet by mouth daily. [name], MD  Active   AMITIZA 24 MCG capsule [id number] No Take 24 mcg by mouth daily. [provider] Taking Active   aspirin EC 325 MG tablet [id number] No Take 325 mg by mouth  daily. [provider] Taking Active   aspirin-acetaminophen-caffeine (EXCEDRIN MIGRAINE) 225 474 1664 MG tablet [id number] No Take 2 tablets by mouth every 6 (six) hours as needed for headache. [provider] Taking Active   azelastine (ASTELIN) 0.1 % nasal spray [id number] No Place into the nose in the morning and at bedtime. [provider] Taking Active   azithromycin (ZITHROMAX Z-PAK) 250 MG tablet [id number] No Take 2 tablets on day 1 and then follow with 1 tablet daily for 4 more days [name], [name], DO Taking Active   clopidogrel (PLAVIX) 75 MG tablet [id number] No Take 75 mg by mouth daily. [provider] Taking Active   co-enzyme Q-10 30 MG capsule [id number] No Take 100 mg by mouth daily. [provider] Taking Active   Cyanocobalamin (B-12) 3000 MCG CAPS [id number] No Take 1 capsule by mouth daily. [provider] Taking Active   diclofenac sodium (VOLTAREN) 1 % GEL [id number] No Apply 2 g topically 4 (four) times daily as needed. Shon Hale, MD Taking Active   esomeprazole (NEXIUM) 40 MG capsule [id number]  TAKE 1 CAPSULE BY MOUTH DAILY AT 12 [name], [name], MD  Active   famotidine (PEPCID) 20 MG tablet [id number] No One after supper [name], MD Taking Active   HYDROcodone-acetaminophen (NORCO/VICODIN) 5-325 MG tablet [id number] No Take 1 tablet by mouth every 8 (eight) hours as needed. [provider] Taking Active   lidocaine (HM LIDOCAINE PATCH) 4 % [id number] No Place 1 patch onto the skin daily. [name], MD Taking Active   linaclotide [facility]) 145 MCG CAPS capsule [id number] No TAKE 1 CAPSULE BY MOUTH DAILY BEFORE Erskine Speed, MD Taking Active   naloxone [facility]) nasal spray 4 mg/0.1 mL [id number] No Place into the nose as needed. overdose [provider] Taking Active   olmesartan (BENICAR) 5 MG tablet [id number]  Take 2 tablets (10 mg total) by mouth 2 (two) times daily. [name], DO  Active Self  Pitavastatin Calcium 1 MG TABS [id number] No Take 1 tablet (1 mg total) by mouth 2 (two) times a week. [name], [name], DO Taking Active   SUMAtriptan (IMITREX) 50 MG tablet [id number]  Take 1 tablet by mouth every 2 hours as needed for migraine. May repeat in 2 hours if headache persists or recurs. [name], MD  Active   Turmeric 500 MG CAPS [id number] No Take 2 tablets by mouth daily. [provider] Taking Active   venlafaxine XR (EFFEXOR-XR) 37.5 MG 24 hr capsule [id number]  Take 1 capsule by mouth daily with breakfast. 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 12/26/2021   Encounter for hearing screening with abnormal findings 06/14/2021   Rotator cuff tear, non-traumatic, right 05/21/2021  Lung nodule 09/19/2020   Dry eye syndrome of both eyes 01/15/2020   Memory change 02/07/2019   Tobacco abuse 12/08/2017   Chronic obstructive pulmonary disease (HCC) 03/04/2017   Postmenopausal bone loss 05/29/2014   Migraine aura, persistent, intractable 05/24/2014   CKD (chronic kidney disease), 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   Atrophic vaginitis 08/28/2012   Irritable bowel syndrome (IBS) 06/07/2012   Restless leg syndrome 05/28/2012   Anxiety 05/28/2012   Hypertension, essential, benign 05/28/2012   Hyperlipidemia 05/28/2012   Barrett's esophagus 05/20/2012   Conditions to be addressed/monitored per PCP order:  Chronic healthcare management needs, CAD, HTN, HLD, COPD, CKD, gout, asthma, syncope, chronic pain, RLS, migraines, IBS  Care Plan : RN Care Manager Plan of Care   Updates made by Danie Chandler, RN since 06/05/2023 12:00 AM     Problem: Health Promotion or Disease Self-Management (General Plan of Care)      Goal: Establish Plan of Care for Chronic Disease Management Needs   Priority: High  Note:   Timeframe:  Long-Range Goal Priority:  High Start Date:      12/10/22                       Expected End Date:    ongoing                  Follow Up Date 07/16/23   - schedule appointment for flu shot - schedule appointment for vaccines needed due to my age or health - schedule recommended health tests (blood work, mammogram, colonoscopy, pap test) - schedule and keep appointment for annual check-up    Why is this important?   Screening tests can find diseases early when they are easier to treat.  Your doctor or nurse will talk with you about which tests are important for you.  Getting shots for common diseases like the flu and shingles will help prevent them. 06/05/23:  CARDS f/u 07/15/23   Long-Range Goal: Chronic Disease Management   Start Date: 12/10/2022  Expected End Date: 09/05/2023  Priority: High  Note:   .Current Barriers:  Knowledge Deficits related to plan of care for management of CAD, HTN, HLD, COPD, CKD Stage 2, Gout, Asthma, and syncope, chronic pain, RLS, migraines, IBS, vaping  Chronic Disease Management support and education needs related to CAD, HTN, HLD, COPD, CKD Stage 2, Gout, Asthma, and syncope, chronic pain, RLS, migraines, IBS, vaping 06/05/23:  Labile BP, checks twice a day-has CARDS f/u 8/14.  No problems with vertigo right now.  Daily vaping.  RNCM Clinical Goal(s):  Patient will verbalize understanding of plan for management of CAD, HTN, HLD, COPD, CKD Stage 2, gout, Asthma, and syncope, chronic pain, RLS, migraines, IBS,vaping as evidenced by patient report and taking all medications verbalize basic understanding of  CAD, HTN, HLD, COPD, CKD Stage 2, Gout, Asthma, and syncope, chronic pain, RLS, migraines,IBS,  vaping   disease process and self health management plan as evidenced by patient report and taking all medications take all medications exactly as prescribed and will call provider for medication related questions as evidenced by patient report demonstrate understanding of rationale for each prescribed medication as evidenced by patient report attend all scheduled medical appointments as evidenced by patient report demonstrate Improved adherence to prescribed treatment plan for CAD, HTN, HLD, COPD, CKD Stage 2, Gout, Asthma, and syncope, chronic pain, RLS, migraines, IBS, vaping  as evidenced by patient report and taking all medications continue to work with [employer]  Care Manager to address care management and care coordination needs related to  CAD, HTN, HLD, COPD, CKD Stage 2, Gout, and Asthma, syncope, chronic pain, RLS, migraines, IBS, vaping as evidenced by adherence to CM Team Scheduled appointments through collaboration with RN Care manager, provider, and care team.   Interventions: Inter-disciplinary care team collaboration (see longitudinal plan of care) Evaluation of current treatment plan related to  self management and patient's adherence to plan as established by provider  Asthma: (Status:New goal.) Long Term Goal Provided education about and advised patient to utilize infection prevention strategies to reduce risk of respiratory infection Discussed the importance of adequate rest and management of fatigue with Asthma Assessed social determinant of health barriers   CAD Interventions: (Status:  New goal.) Long Term Goal Assessed understanding of CAD diagnosis Medications reviewed including medications utilized in CAD treatment plan Reviewed Importance of taking all medications as prescribed Reviewed Importance of attending all scheduled provider appointments Advised patient to discuss medications with provider Assessed social determinant of health barriers   Chronic Kidney Disease  Interventions:  (Status:  New goal.) Long Term Goal Evaluation of current treatment plan related to chronic kidney disease self management and patient's adherence to plan as established by provider      Reviewed medications with patient and discussed importance of compliance    Advised patient, providing education and rationale, to monitor blood pressure daily and record, calling PCP for findings outside established parameters    Reviewed scheduled/upcoming provider appointments including    Discussed plans with patient for ongoing care management follow up and provided patient with direct contact information for care management team    Assessed social determinant of health barriers    Last practice recorded BP readings:  BP Readings from Last 3 Encounters:  11/19/22 98/60  11/10/22 (!) 146/73  11/10/22 (!) 140/84  01/26/23         129/84  Most recent eGFR/CrCl: No results found for: "EGFR"  No components found for: "CRCL"  COPD Interventions:  (Status:  New goal.) Long Term Goal Provided education about and advised patient to utilize infection prevention strategies to reduce risk of respiratory infection Discussed the importance of adequate rest and management of fatigue with COPD Assessed social determinant of health barriers  Hyperlipidemia Interventions:  (Status:  New goal.) Long Term Goal Medication review performed; medication list updated in electronic medical record.  Assessed social determinant of health barriers   Hypertension Interventions:  (Status:  New goal.) Long Term Goal Last practice recorded BP readings:  BP Readings from Last 3 Encounters:  11/19/22 98/60  11/10/22 (!) 146/73  11/10/22 (!) 140/84  01/26/23         129/84 03/06/23:           135-165/76-93 04/06/23            129-167/71-82  Most recent eGFR/CrCl: No results found for: "EGFR"  No components found for: "CRCL"  Evaluation of current treatment plan related to hypertension self management and patient's  adherence to plan as established by provider Reviewed medications with patient and discussed importance of compliance Discussed plans with patient for ongoing care management follow up and provided patient with direct contact information for care management team Advised patient, providing education and rationale, to monitor blood pressure daily and record, calling PCP for findings outside established parameters Reviewed scheduled/upcoming provider appointments including:  Advised patient to discuss medication  with provider Assessed social determinant of health barriers  Pain Interventions:  (Status:  New  goal.) Long Term Goal Pain assessment performed Medications reviewed Reviewed provider established plan for pain management Discussed importance of adherence to all scheduled medical appointments Counseled on the importance of reporting any/all new or changed pain symptoms or management strategies to pain management provider Advised patient to report to care team affect of pain on daily activities Discussed use of relaxation techniques and/or diversional activities to assist with pain reduction (distraction, imagery, relaxation, massage, acupressure, TENS, heat, and cold application Reviewed with patient prescribed pharmacological and nonpharmacological pain relief strategies Assessed social determinant of health barriers  Patient Goals/Self-Care Activities: Take all medications as prescribed Attend all scheduled provider appointments Call pharmacy for medication refills 3-7 days in advance of running out of medications Perform all self care activities independently  Perform IADL's (shopping, preparing meals, housekeeping, managing finances) independently Call provider office for new concerns or questions  Patient to follow up with provider regarding cough, BP medication, RLS-completed  Follow Up Plan:  The patient has been provided with contact information for the care management team  and has been advised to call with any health related questions or concerns.  The care management team will reach out to the patient again over the next 45 business  days.    Follow Up:  Patient agrees to Care Plan and Follow-up.  Plan: The Managed Medicaid care management team will reach out to the patient again over the next 45 business  days. and The  Patient has been provided with contact information for the Managed Medicaid care management team and has been advised to call with any health related questions or concerns.  Date/time of next scheduled RN care management/care coordination outreach: 07/16/23 at 1230

## 2023-06-05 NOTE — Patient Instructions (Signed)
Hi Diana [name], I am glad you are doing okay today-have a great weekend!  Diana [name] was given information about Medicaid Managed Care team care coordination services as a part of their Healthy [facility] Medicaid benefit. Diana [name] verbally consented to engagement with the [facility] Managed Care team.   If you are experiencing a medical emergency, please call 911 or report to your local emergency department or urgent care.   If you have a non-emergency medical problem during routine business hours, please contact your provider's office and ask to speak with a nurse.   For questions related to your Healthy [facility] health plan, please call: [phone] or visit the homepage here: [email or web address]  If you would like to schedule transportation through your Healthy [place] plan, please call the following number at least 2 days in advance of your appointment: [phone]  For information about your ride after you set it up, call Ride Assist at [phone]. Use this number to activate a Will Call pickup, or if your transportation is late for a scheduled pickup. Use this number, too, if you need to make a change or cancel a previously scheduled reservation.  If you need transportation services right away, call [phone]. The after-hours call center is staffed 24 hours to handle ride assistance and urgent reservation requests (including discharges) 365 days a year. Urgent trips include sick visits, hospital discharge requests and life-sustaining treatment.  Call the [facility] Line at [phone], at any time, 24 hours a day, 7 days a week. If you are in danger or need immediate medical attention call 911.  If you would like help to quit smoking, call 1-800-QUIT-NOW (7152949310) OR Espaol: 1-855-Djelo-Ya (0-626-948-5462) o para ms informacin haga clic aqu or Text READY to 703-500 to register via text  Diana [name] - following are the  goals we discussed in your visit today:   Goals Addressed    Timeframe:  Long-Range Goal Priority:  High Start Date:      12/10/22                       Expected End Date:    ongoing                  Follow Up Date 07/16/23   - schedule appointment for flu shot - schedule appointment for vaccines needed due to my age or health - schedule recommended health tests (blood work, mammogram, colonoscopy, pap test) - schedule and keep appointment for annual check-up    Why is this important?   Screening tests can find diseases early when they are easier to treat.  Your doctor or nurse will talk with you about which tests are important for you.  Getting shots for common diseases like the flu and shingles will help prevent them. 06/05/23:  CARDS f/u 07/15/23  Patient verbalizes understanding of instructions and care plan provided today and agrees to view in MyChart. Active MyChart status and patient understanding of how to access instructions and care plan via MyChart confirmed with patient.     The Managed Medicaid care management team will reach out to the patient again over the next 45 business  days.  The  Patient has been provided with contact information for the Managed Medicaid care management team and has been advised to call with any health related questions or concerns.   Kathi Der RN, [name] [place]  [employer], production - Managed Medicaid High Risk [phone]  Following is a copy of your plan of care:  Care Plan : RN Care Manager Plan of Care  Updates made by Danie Chandler, RN since 06/05/2023 12:00 AM     Problem: Health Promotion or Disease Self-Management (General Plan of Care)      Goal: Establish Plan of Care for Chronic Disease Management Needs   Priority: High  Note:   Timeframe:  Long-Range Goal Priority:  High Start Date:      12/10/22                       Expected End Date:    ongoing                  Follow Up Date  07/16/23   - schedule appointment for flu shot - schedule appointment for vaccines needed due to my age or health - schedule recommended health tests (blood work, mammogram, colonoscopy, pap test) - schedule and keep appointment for annual check-up    Why is this important?   Screening tests can find diseases early when they are easier to treat.  Your doctor or nurse will talk with you about which tests are important for you.  Getting shots for common diseases like the flu and shingles will help prevent them. 06/05/23:  CARDS f/u 07/15/23     Long-Range Goal: Chronic Disease Management   Start Date: 12/10/2022  Expected End Date: 09/05/2023  Priority: High  Note:   .Current Barriers:  Knowledge Deficits related to plan of care for management of CAD, HTN, HLD, COPD, CKD Stage 2, Gout, Asthma, and syncope, chronic pain, RLS, migraines, IBS, vaping  Chronic Disease Management support and education needs related to CAD, HTN, HLD, COPD, CKD Stage 2, Gout, Asthma, and syncope, chronic pain, RLS, migraines, IBS, vaping 06/05/23:  Labile BP, checks twice a day-has CARDS f/u 8/14.  No problems with vertigo right now.  Daily vaping.  RNCM Clinical Goal(s):  Patient will verbalize understanding of plan for management of CAD, HTN, HLD, COPD, CKD Stage 2, gout, Asthma, and syncope, chronic pain, RLS, migraines, IBS,vaping as evidenced by patient report and taking all medications verbalize basic understanding of  CAD, HTN, HLD, COPD, CKD Stage 2, Gout, Asthma, and syncope, chronic pain, RLS, migraines,IBS,  vaping  disease process and self health management plan as evidenced by patient report and taking all medications take all medications exactly as prescribed and will call provider for medication related questions as evidenced by patient report demonstrate understanding of rationale for each prescribed medication as evidenced by patient report attend all scheduled medical appointments as evidenced by  patient report demonstrate Improved adherence to prescribed treatment plan for CAD, HTN, HLD, COPD, CKD Stage 2, Gout, Asthma, and syncope, chronic pain, RLS, migraines, IBS, vaping  as evidenced by patient report and taking all medications continue to work with RN Care Manager to address care management and care coordination needs related to  CAD, HTN, HLD, COPD, CKD Stage 2, Gout, and Asthma, syncope, chronic pain, RLS, migraines, IBS, vaping as evidenced by adherence to CM Team Scheduled appointments through collaboration with RN Care manager, provider, and care team.   Interventions: Inter-disciplinary care team collaboration (see longitudinal plan of care) Evaluation of current treatment plan related to  self management and patient's adherence to plan as established by provider  Asthma: (Status:New goal.) Long Term Goal Provided education about and advised patient to utilize infection prevention strategies to reduce risk of respiratory infection Discussed  the importance of adequate rest and management of fatigue with Asthma Assessed social determinant of health barriers   CAD Interventions: (Status:  New goal.) Long Term Goal Assessed understanding of CAD diagnosis Medications reviewed including medications utilized in CAD treatment plan Reviewed Importance of taking all medications as prescribed Reviewed Importance of attending all scheduled provider appointments Advised patient to discuss medications with provider Assessed social determinant of health barriers   Chronic Kidney Disease Interventions:  (Status:  New goal.) Long Term Goal Evaluation of current treatment plan related to chronic kidney disease self management and patient's adherence to plan as established by provider      Reviewed medications with patient and discussed importance of compliance    Advised patient, providing education and rationale, to monitor blood pressure daily and record, calling PCP for findings outside  established parameters    Reviewed scheduled/upcoming provider appointments including    Discussed plans with patient for ongoing care management follow up and provided patient with direct contact information for care management team    Assessed social determinant of health barriers    Last practice recorded BP readings:  BP Readings from Last 3 Encounters:  11/19/22 98/60  11/10/22 (!) 146/73  11/10/22 (!) 140/84  01/26/23         129/84  Most recent eGFR/CrCl: No results found for: "EGFR"  No components found for: "CRCL"  COPD Interventions:  (Status:  New goal.) Long Term Goal Provided education about and advised patient to utilize infection prevention strategies to reduce risk of respiratory infection Discussed the importance of adequate rest and management of fatigue with COPD Assessed social determinant of health barriers  Hyperlipidemia Interventions:  (Status:  New goal.) Long Term Goal Medication review performed; medication list updated in electronic medical record.  Assessed social determinant of health barriers   Hypertension Interventions:  (Status:  New goal.) Long Term Goal Last practice recorded BP readings:  BP Readings from Last 3 Encounters:  11/19/22 98/60  11/10/22 (!) 146/73  11/10/22 (!) 140/84  01/26/23         129/84 03/06/23:           135-165/76-93 04/06/23            129-167/71-82  Most recent eGFR/CrCl: No results found for: "EGFR"  No components found for: "CRCL"  Evaluation of current treatment plan related to hypertension self management and patient's adherence to plan as established by provider Reviewed medications with patient and discussed importance of compliance Discussed plans with patient for ongoing care management follow up and provided patient with direct contact information for care management team Advised patient, providing education and rationale, to monitor blood pressure daily and record, calling PCP for findings outside established  parameters Reviewed scheduled/upcoming provider appointments including:  Advised patient to discuss medication  with provider Assessed social determinant of health barriers  Pain Interventions:  (Status:  New goal.) Long Term Goal Pain assessment performed Medications reviewed Reviewed provider established plan for pain management Discussed importance of adherence to all scheduled medical appointments Counseled on the importance of reporting any/all new or changed pain symptoms or management strategies to pain management provider Advised patient to report to care team affect of pain on daily activities Discussed use of relaxation techniques and/or diversional activities to assist with pain reduction (distraction, imagery, relaxation, massage, acupressure, TENS, heat, and cold application Reviewed with patient prescribed pharmacological and nonpharmacological pain relief strategies Assessed social determinant of health barriers  Patient Goals/Self-Care Activities: Take all medications as prescribed  Attend all scheduled provider appointments Call pharmacy for medication refills 3-7 days in advance of running out of medications Perform all self care activities independently  Perform IADL's (shopping, preparing meals, housekeeping, managing finances) independently Call provider office for new concerns or questions  Patient to follow up with provider regarding cough, BP medication, RLS-completed  Follow Up Plan:  The patient has been provided with contact information for the care management team and has been advised to call with any health related questions or concerns.  The care management team will reach out to the patient again over the next 45 business  days.

## 2023-06-15 ENCOUNTER — Telehealth: Payer: Self-pay

## 2023-06-15 NOTE — Telephone Encounter (Signed)
Patient calls nurse line reporting an adverse reaction to Cipro.  She reports she went to [facility] yesterday for UTI symptoms. She reports she was given Cipro and sent home.   She reports after she took the first dose she went to sleep for the night. However, she woke up at ~ 230am and was itching all over. She reports the itching has progressively worsened.   She reports she is allergic to "a lot of things." She denies any difficulty breathing or trouble swallowing.   Patient advised to go back to the UC she went to yesterday.   Patient agreed with plan.

## 2023-07-15 ENCOUNTER — Ambulatory Visit: Payer: Medicaid Other | Attending: Cardiology | Admitting: Physician Assistant

## 2023-07-15 ENCOUNTER — Ambulatory Visit: Payer: Medicaid Other | Admitting: Cardiology

## 2023-07-15 ENCOUNTER — Encounter: Payer: Self-pay | Admitting: Physician Assistant

## 2023-07-15 VITALS — BP 136/84 | HR 86 | Ht 61.0 in | Wt 152.6 lb

## 2023-07-15 DIAGNOSIS — E785 Hyperlipidemia, unspecified: Secondary | ICD-10-CM | POA: Diagnosis not present

## 2023-07-15 DIAGNOSIS — I2510 Atherosclerotic heart disease of native coronary artery without angina pectoris: Secondary | ICD-10-CM | POA: Diagnosis not present

## 2023-07-15 DIAGNOSIS — I6523 Occlusion and stenosis of bilateral carotid arteries: Secondary | ICD-10-CM | POA: Diagnosis not present

## 2023-07-15 DIAGNOSIS — I441 Atrioventricular block, second degree: Secondary | ICD-10-CM

## 2023-07-15 DIAGNOSIS — I10 Essential (primary) hypertension: Secondary | ICD-10-CM | POA: Diagnosis not present

## 2023-07-15 NOTE — Patient Instructions (Signed)
Medication Instructions:  NO CHANGES *If you need a refill on your cardiac medications before your next appointment, please call your pharmacy*   Lab Work: NO LAB If you have labs (blood work) drawn today and your tests are completely normal, you will receive your results only by: MyChart Message (if you have MyChart) OR A paper copy in the mail If you have any lab test that is abnormal or we need to change your treatment, we will call you to review the results.   Testing/Procedures: NO TESTING   Follow-Up: At [facility], you and your health needs are our priority.  As part of our continuing mission to provide you with exceptional heart care, we have created designated Provider Care Teams.  These Care Teams include your primary Cardiologist (physician) and Advanced Practice Providers (APPs -  Physician Assistants and Nurse Practitioners) who all work together to provide you with the care you need, when you need it.   Your next appointment:   6 month(s)  Provider:   [name], DO

## 2023-07-15 NOTE — Progress Notes (Signed)
Cardiology Office Note:  .   Date:  07/15/2023  ID:  Diana [name], DOB August 27, 1956, MRN [id number] PCP: Para March, DO  [facility] Providers Cardiologist:  [name], DO     History of Present Illness: Diana [name] is a 67 y.o. female with past medical history of Hollenhorst plaque, hyperlipidemia, hypertension, tobacco abuse, carotid artery disease s/p right CEA, history of CVA and mild CAD on coronary CT 10/2022.  Echocardiogram obtained on 01/18/2020 showed EF 55 to 60%, grade 1 DD.  Heart monitor obtained in February 2021 showed second-degree AV block lasting 3 seconds, asymptomatic paroxysmal supraventricular tachycardia, PAC less than 1%, PVC less than 1%.  Beta-blocker was stopped afterward.  She has not required a pacemaker.  She is on Livalo and Zetia for hyperlipidemia.  Most recent echocardiogram obtained at [facility] on 09/08/2022 showed EF 50 to 55%, trivial MR, trivial TR, no regional wall motion abnormality.  Repeat heart monitor obtained in November 2023 shows no evidence of any AV block, occasional paroxysmal supraventricular tachycardia as well as PACs.  Coronary CT obtained on 11/19/2022 showed coronary calcium score 418, which placed the patient at 45 percentile for age and sex matched control, mild nonobstructive CAD less than 50%.  She underwent a right CEA for symptomatic right carotid artery stenosis in November 2023.  Carotid Doppler obtained in April 2024 showed 60 to 79% stenosis in bilateral internal carotid artery.  This is being followed by vascular surgery at [facility].  She is on aspirin and Plavix and along with statin therapy.  Repeat 50-month follow-up was recommended.  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  This is being followed by neurosurgery who recommended repeat MRA of the head in 1 year.  Patient presents today for follow-up.  She  will hold off taking her blood pressure medication when her blood pressure gets too low.  She does not tolerate low blood pressure very well especially with history of carotid artery disease.  She has passed out late last year and earlier this year, she has not had any further dizziness in the past few weeks.  Blood pressure ranges from 120-150s systolic at home which I think is reasonable.  Her LDL was elevated at 104 in February, however she did not take Praluent at the time.  She has resumed on Praluent since.  ROS:   She denies chest pain, palpitations, dyspnea, pnd, orthopnea, n, v, dizziness, syncope, edema, weight gain, or early satiety. All other systems reviewed and are otherwise negative except as noted above.    Studies Reviewed: .        Cardiac Studies & Procedures       ECHOCARDIOGRAM  ECHOCARDIOGRAM COMPLETE 01/18/2020  Narrative ECHOCARDIOGRAM REPORT    Patient Name:   Diana [name] Date of Exam: 01/18/2020 Medical Rec #:  [id number]    Height:       61.0 in Accession #:    [id number]   Weight:       141.0 lb Date of Birth:  1956-04-12    BSA:          1.63 m Patient Age:    63 years     BP:           156/75 mmHg Patient Gender: F            HR:  68 bpm. Exam Location:  Outpatient  Procedure: 2D Echo  Indications:    hollenhorst plaque, left eye. Surgery clearance.  History:        Patient has no prior history of Echocardiogram examinations. Risk Factors:Hypertension, Dyslipidemia and Current Smoker.  Sonographer:    Delcie Roch RDCS Referring Phys: [id number] MARSHALL L CHAMBLISS  IMPRESSIONS   1. Left ventricular ejection fraction, by estimation, is 55 to 60%. The left ventricle has normal function. The left ventricle has no regional wall motion abnormalities. Left ventricular diastolic parameters are consistent with Grade I diastolic dysfunction (impaired relaxation). 2. Right ventricular systolic function is normal. The right ventricular size is  normal. 3. The mitral valve is normal in structure and function. No evidence of mitral valve regurgitation. No evidence of mitral stenosis. 4. The aortic valve is normal in structure and function. Aortic valve regurgitation is not visualized. No aortic stenosis is present. 5. There is no obvious atheromatous plaque identified in the visible portions of the ascending aorta or aortic arch. 6. The inferior vena cava is normal in size with greater than 50% respiratory variability, suggesting right atrial pressure of 3 mmHg.  Comparison(s): No prior Echocardiogram.  FINDINGS Left Ventricle: Left ventricular ejection fraction, by estimation, is 55 to 60%. The left ventricle has normal function. The left ventricle has no regional wall motion abnormalities. The left ventricular internal cavity size was normal in size. There is no left ventricular hypertrophy. Left ventricular diastolic parameters are consistent with Grade I diastolic dysfunction (impaired relaxation). Normal left ventricular filling pressure.  Right Ventricle: The right ventricular size is normal. No increase in right ventricular wall thickness. Right ventricular systolic function is normal.  Left [facility]: Left atrial size was normal in size.  Right [facility]: Right atrial size was normal in size.  Pericardium: There is no evidence of pericardial effusion.  Mitral Valve: The mitral valve is normal in structure and function. Normal mobility of the mitral valve leaflets. No evidence of mitral valve regurgitation. No evidence of mitral valve stenosis.  Tricuspid Valve: The tricuspid valve is normal in structure. Tricuspid valve regurgitation is not demonstrated. No evidence of tricuspid stenosis.  Aortic Valve: The aortic valve is normal in structure and function. Aortic valve regurgitation is not visualized. No aortic stenosis is present.  Pulmonic Valve: The pulmonic valve was normal in structure. Pulmonic valve regurgitation is not  visualized. No evidence of pulmonic stenosis.  Aorta: There is no obvious atheromatous plaque identified in the visible portions of the ascending aorta or aortic arch. The aortic root is normal in size and structure and the aortic root, ascending aorta and aortic arch are all structurally normal, with no evidence of dilitation or obstruction.  Venous: The inferior vena cava is normal in size with greater than 50% respiratory variability, suggesting right atrial pressure of 3 mmHg.  IAS/Shunts: No atrial level shunt detected by color flow Doppler.   LEFT VENTRICLE PLAX 2D LVIDd:         4.20 cm  Diastology LVIDs:         2.70 cm  LV e' lateral:   8.81 cm/s LV PW:         0.90 cm  LV E/e' lateral: 10.7 LV IVS:        0.80 cm  LV e' medial:    6.64 cm/s LVOT diam:     1.60 cm  LV E/e' medial:  14.2 LV SV:         48.25 ml LV SV  Index:   30.76 LVOT Area:     2.01 cm   RIGHT VENTRICLE RV S prime:     9.79 cm/s TAPSE (M-mode): 2.8 cm  LEFT ATRIUM             Index       RIGHT ATRIUM          Index LA diam:        3.70 cm 2.27 cm/m  RA Area:     8.37 cm LA Vol (A2C):   33.9 ml 20.82 ml/m RA Volume:   14.50 ml 8.91 ml/m LA Vol (A4C):   33.5 ml 20.58 ml/m LA Biplane Vol: 34.4 ml 21.13 ml/m AORTIC VALVE LVOT Vmax:   106.00 cm/s LVOT Vmean:  73.300 cm/s LVOT VTI:    0.240 m  AORTA Ao Root diam: 2.80 cm  MITRAL VALVE MV Area (PHT): 3.85 cm     SHUNTS MV Decel Time: 197 msec     Systemic VTI:  0.24 m MV E velocity: 94.30 cm/s   Systemic Diam: 1.60 cm MV A velocity: 107.00 cm/s MV E/A ratio:  0.88  Mihai Croitoru MD Electronically signed by Thurmon Fair MD Signature Date/Time: 01/18/2020/10:46:26 AM    Final    MONITORS  LONG TERM MONITOR-LIVE TELEMETRY (3-14 DAYS) 10/27/2022  Narrative Patch Wear Time:  14 days and 0 hours (2023-10-28T15:18:41-0400 to 2023-11-11T14:18:41-0500)  Patient had a min HR of 62 bpm, max HR of 169 bpm, and avg HR of 90 bpm.  Predominant underlying rhythm was Sinus Rhythm. QRS morphology changes were present throughout recording.  28 Supraventricular Tachycardia runs occurred, the run with the fastest interval lasting 7 beats with a max rate of 169 bpm, the longest lasting 14 beats with an avg rate of 109 bpm. Supraventricular Tachycardia was detected within +/- 45 seconds of symptomatic patient event(s). Isolated SVEs were rare (<1.0%), SVE Couplets were rare (<1.0%), and SVE Triplets were rare (<1.0%). Isolated VEs were rare (<1.0%), and no VE Couplets or VE Triplets were present.  Symptoms associated with paroxysmal supraventricular tachycardia, rare premature atrial complex.  Conclusion: This study is remarkable for the following: 1.  Symptomatic paroxysmal supraventricular tachycardia. 2.  Symptomatic rare premature atrial complex  Compared to prior monitor done.  No evidence of AV blocks.   CT SCANS  CT CORONARY MORPH W/CTA COR W/SCORE 11/19/2022  Addendum 11/19/2022  5:26 PM ADDENDUM REPORT: 11/19/2022 17:23  ADDENDUM: Comparison was also made with the study of November 07, 2022.   Electronically Signed By: Donzetta Kohut M.D. On: 11/19/2022 17:23  Addendum 11/19/2022  3:22 PM ADDENDUM REPORT: 11/19/2022 15:20  ADDENDUM: OVER-READ INTERPRETATION  CT CHEST  The following report is an over-read performed by radiologist Dr. Lovie Chol [facility] on 11/19/2022. This over-read does not include interpretation of cardiac or coronary anatomy or pathology. The CT of November 07, 2022 of the chest. Interpretation by the cardiologist is attached. Imaging of the chest is focused on cardiac structures and excludes much of the chest on CT.  COMPARISON:  None available  FINDINGS:  Cardiovascular: See dedicated report for cardiovascular details. There is calcified aortic atherosclerosis and coronary artery disease.  Mediastinum/Nodes: No adenopathy or acute process in  the mediastinum.  Lungs/Pleura: Lungs are clear and airways are patent to the extent evaluated aside from mild mosaic attenuation and basilar atelectasis.  Upper Abdomen: Incidental imaging of upper abdominal contents without acute findings.  Musculoskeletal: Spinal degenerative changes without acute or destructive bone process.  IMPRESSION:  1. Mild mosaic attenuation and basilar atelectasis, nonspecific but can be seen in the setting of small airways disease.  Aortic Atherosclerosis (ICD10-I70.0).   Electronically Signed By: Donzetta Kohut M.D. On: 11/19/2022 15:20  Narrative CLINICAL DATA:  This is a 68 year old female with angina symptoms.  EXAM: Cardiac/Coronary  CTA  TECHNIQUE: The patient was scanned on a [employer].  FINDINGS: A 100 kV prospective scan was triggered in the descending thoracic aorta at 111 HU's. Axial non-contrast 3 mm slices were carried out through the heart. The data set was analyzed on a dedicated work station and scored using the Agatson method. Gantry rotation speed was 250 msecs and collimation was .6 mm. No beta blockade and 0.8 mg of sl NTG was given. The 3D data set was reconstructed in 5% intervals of the 67-82 % of the R-R cycle. Diastolic phases were analyzed on a dedicated work station using MPR, MIP and VRT modes. The patient received 80 cc of contrast.  Aorta: Normal size. Moderate aortic root calcifications. No dissection.  Aortic Valve:  Trileaflet.  No calcifications.  Coronary Arteries:  Normal coronary origin.  Right dominance.  RCA is a large dominant artery that gives rise to PDA and PLA. There is a mild focal calcification in the mid RCA.  Left main is a large artery that gives rise to LAD and LCX arteries.  LAD is a large vessel. Mild focal calcification in the proximal and mid LAD. The distal LAD with no plaques.  LCX is a non-dominant artery that gives rise to one large OM1 branch. There is no  plaque.  Coronary Calcium Score:  Left main: 0  Left anterior descending artery: 246  Left circumflex artery: 0  Right coronary artery: 47.2  Total: 418  Percentile: 45  Other findings:  Normal pulmonary vein drainage into the left [facility]rium.  Normal left atrial appendage without a thrombus.  Normal size of the pulmonary artery.  IMPRESSION: 1. Coronary calcium score of 418. This was 45 percentile for age and sex matched control.  2. Normal coronary origin with right dominance.  3. CAD-RADS 2. Mild non-obstructive CAD (25-49%). Consider non-atherosclerotic causes of chest pain. Consider preventive therapy and risk factor modification.  The noncardiac portion of this study will be interpreted in separate report by the radiologist.  Electronically Signed: By: [name] D.O. On: 11/19/2022 14:41          Risk Assessment/Calculations:            Physical Exam:   VS:  There were no vitals taken for this visit.   Wt Readings from Last 3 Encounters:  02/02/23 143 lb (64.9 kg)  01/20/23 143 lb 3.2 oz (65 kg)  12/30/22 142 lb (64.4 kg)    GEN: Well nourished, well developed in no acute distress NECK: No JVD; No carotid bruits CARDIAC: RRR, no murmurs, rubs, gallops RESPIRATORY:  Clear to auscultation without rales, wheezing or rhonchi  ABDOMEN: Soft, non-tender, non-distended EXTREMITIES:  No edema; No deformity   ASSESSMENT AND PLAN: .    CAD: Denies any recent chest pain.  On aspirin  Carotid artery disease: Underwent a right CEA in November 2023, continued to have 60 to 79% stenosis in bilateral internal carotid artery and repeat carotid Doppler in April 2024, this is being followed by vascular surgery at [facility].  Hyperlipidemia: On Praluent and pitavastatin  Hypertension: Blood pressure well-controlled on current therapy  History of second-degree heart block: Resolved after stopping beta-blocker.  Repeat heart monitor in November 2023 was negative for  acute process.  She did have syncope near the end of 2023 and early 2024, this has not recurred.  She underwent right CEA for carotid artery disease.       Dispo: Follow-up with Dr. Servando Salina in 6 month  Signed, Diana [name], [place]

## 2023-07-16 ENCOUNTER — Other Ambulatory Visit: Payer: Medicaid Other | Admitting: Obstetrics and Gynecology

## 2023-07-16 NOTE — Patient Instructions (Signed)
Hi Ms. Landgrebe, I am sorry I missed you today, I hope all is well - as a part of your Medicaid benefit, you are eligible for care management and care coordination services at no cost or copay. I was unable to reach you by phone today but would be happy to help you with your health related needs. Please feel free to call me at [phone]  A member of the Managed Medicaid care management team will reach out to you again over the next 30 business days.   Kathi Der RN, [name] [place]  [employer] - Managed Medicaid High Risk [phone]

## 2023-07-16 NOTE — Patient Outreach (Signed)
  Medicaid Managed Care   Unsuccessful Attempt Note   07/16/2023 Name: Diana [name] MRN: [id number] DOB: 1956-09-09  Referred by: Para March, DO Reason for referral : High Risk Managed Medicaid (Unsuccessful telephone outreach)  An unsuccessful telephone outreach was attempted today. The patient was referred to the case management team for assistance with care management and care coordination.    Follow Up Plan: The Managed Medicaid care management team will reach out to the patient again over the next 30 business  days. and The  Patient has been provided with contact information for the Managed Medicaid care management team and has been advised to call with any health related questions or concerns.   Kathi Der RN, [name] [place]  [employer] - Managed Medicaid High Risk [phone]

## 2023-07-22 ENCOUNTER — Encounter: Payer: Self-pay | Admitting: Obstetrics and Gynecology

## 2023-07-22 ENCOUNTER — Other Ambulatory Visit: Payer: Medicaid Other | Admitting: Obstetrics and Gynecology

## 2023-07-22 NOTE — Patient Outreach (Signed)
Medicaid Managed Care   Nurse Care Manager Note  07/22/2023 Name:  Diana [name] MRN:  [id number] DOB:  07/10/1956  Diana [name] is an 67 y.o. year old female who is a primary patient of Everhart, Kirstie, DO.  The [facility] Managed Care Coordination team was consulted for assistance with:    Chronic healthcare management needs, CAD, HTN, HLD, COPD, CKD, chronic pain, RLS, gout, asthma, migraines, IBS  Ms. Supplee was given information about Medicaid Managed Care Coordination team services today. Diana [name] Patient agreed to services and verbal consent obtained.  Engaged with patient by telephone for follow up visit in response to provider referral for case management and/or care coordination services.   Assessments/Interventions:  Review of past medical history, allergies, medications, health status, including review of consultants reports, laboratory and other test data, was performed as part of comprehensive evaluation and provision of chronic care management services.  SDOH (Social Determinants of Health) assessments and interventions performed: SDOH Interventions    Flowsheet Row Patient Outreach Telephone from 07/22/2023 in [place] POPULATION HEALTH DEPARTMENT Patient Outreach Telephone from 06/05/2023 in [place] POPULATION HEALTH DEPARTMENT Patient Outreach Telephone from 04/06/2023 in [place] POPULATION HEALTH DEPARTMENT Patient Outreach Telephone from 03/06/2023 in [place] POPULATION HEALTH DEPARTMENT Patient Outreach Telephone from 02/04/2023 in [place] POPULATION HEALTH DEPARTMENT Patient Outreach Telephone from 01/06/2023 in [place] POPULATION HEALTH DEPARTMENT  SDOH Interventions        Food Insecurity Interventions -- -- -- -- -- Intervention Not Indicated  Housing Interventions -- -- -- Intervention Not Indicated -- Intervention Not Indicated  Transportation Interventions Intervention Not Indicated -- -- -- -- --  Utilities Interventions -- -- -- -- Intervention Not  Indicated --  Alcohol Usage Interventions -- -- -- -- Intervention Not Indicated (Score <7) --  Financial Strain Interventions -- -- Intervention Not Indicated -- -- --  Physical Activity Interventions -- -- --  [declined] -- -- --  Stress Interventions -- Intervention Not Indicated -- -- -- --  Health Literacy Interventions Intervention Not Indicated -- -- -- -- --     Care Plan Allergies  Allergen Reactions   Penicillins Anaphylaxis   Demerol [Meperidine] Nausea And Vomiting   Doxycycline Hyclate Nausea And Vomiting   Gabapentin Other (See Comments)    Malaise   Sulfa Antibiotics Hives   Atorvastatin     myalgias   Fluvastatin     myalgia   Levaquin [Levofloxacin]     Reported some possible facial swelling with administration 05/2018   Lovastatin     myalgia   No Known Allergies     Other reaction(s): Dizziness   Pitavastatin     myalgia   Pravastatin     myalgia   Rosuvastatin     myalgia   Simvastatin     myalgia   Benadryl [Diphenhydramine Hcl] Palpitations and Rash   Buprenorphine Hcl Nausea And Vomiting   Cephalexin Rash   Morphine And Codeine Nausea And Vomiting   Spiriva Handihaler [Tiotropium Bromide Monohydrate] Rash   Medications Reviewed Today     Reviewed by Danie Chandler, RN (Registered Nurse) on 07/22/23 at [phone]  Med List Status: <None>   Medication Order Taking? Sig Documenting Provider Last Dose Status Informant  albuterol (VENTOLIN HFA) 108 (90 Base) MCG/ACT inhaler [id number] No Inhale 2 puffs into the lungs every 4 (four) hours as needed for wheezing or shortness of breath. Evelena Leyden, DO Taking Active   Alirocumab (PRALUENT) 150 MG/ML SOAJ [id number]  No Inject 150 mg into the skin every 14 (fourteen) days. [name], [name], DO Taking Active   allopurinol (ZYLOPRIM) 100 MG tablet [id number] No Take 1 tablet by mouth daily. [name], MD Taking Active   AMITIZA 24 MCG capsule [id number] No Take 24 mcg by mouth daily. [provider]  Taking Active   aspirin EC 325 MG tablet [id number] No Take 325 mg by mouth daily. [provider] Taking Active   aspirin-acetaminophen-caffeine (EXCEDRIN MIGRAINE) [phone] MG tablet [id number] No Take 2 tablets by mouth every 6 (six) hours as needed for headache. [provider] Taking Active   azelastine (ASTELIN) 0.1 % nasal spray [id number] No Place into the nose in the morning and at bedtime. [provider] Taking Active   azithromycin (ZITHROMAX Z-PAK) 250 MG tablet [id number] No Take 2 tablets on day 1 and then follow with 1 tablet daily for 4 more days [name], [name], DO Taking Active   clopidogrel (PLAVIX) 75 MG tablet [id number] No Take 75 mg by mouth daily. [provider] Taking Active   co-enzyme Q-10 30 MG capsule [id number] No Take 100 mg by mouth daily. [provider] Taking Active   Cyanocobalamin (B-12) 3000 MCG CAPS [id number] No Take 1 capsule by mouth daily. [provider] Taking Active   diclofenac sodium (VOLTAREN) 1 % GEL [id number] No Apply 2 g topically 4 (four) times daily as needed. Shon Hale, MD Taking Active   esomeprazole (NEXIUM) 40 MG capsule [id number] No TAKE 1 CAPSULE BY MOUTH DAILY AT 12 [name], [name], MD Taking Active   famotidine (PEPCID) 20 MG tablet [id number] No One after supper [name], MD Taking Active   HYDROcodone-acetaminophen (NORCO/VICODIN) 5-325 MG tablet [id number] No Take 1 tablet by mouth every 8 (eight) hours as needed. [provider] Taking Active   lidocaine (HM LIDOCAINE PATCH) 4 % [id number] No Place 1 patch onto the skin daily. [name], MD Taking Active   linaclotide [facility]) 145 MCG CAPS capsule [id number] No TAKE 1 CAPSULE BY MOUTH DAILY BEFORE Erskine Speed, MD Taking Active   naloxone [facility]) nasal spray 4 mg/0.1 mL [id number] No Place into the nose as needed. overdose [provider] Taking Active   olmesartan (BENICAR) 5 MG  tablet [id number] No Take 2 tablets (10 mg total) by mouth 2 (two) times daily. [name], DO Taking Active Self  Pitavastatin Calcium 1 MG TABS [id number] No Take 1 tablet (1 mg total) by mouth 2 (two) times a week. [name], [name], DO Taking Active   SUMAtriptan (IMITREX) 50 MG tablet [id number] No Take 1 tablet by mouth every 2 hours as needed for migraine. May repeat in 2 hours if headache persists or recurs. [name], MD Taking Active   Turmeric 500 MG CAPS [id number] No Take 2 tablets by mouth daily. [provider] Taking Active   venlafaxine XR (EFFEXOR-XR) 37.5 MG 24 hr capsule [id number] No Take 1 capsule by mouth daily with breakfast. 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 12/26/2021   Encounter for hearing screening with abnormal findings  06/14/2021   Rotator cuff tear, non-traumatic, right 05/21/2021   Lung nodule 09/19/2020   Dry eye syndrome of both eyes 01/15/2020   Memory change 02/07/2019   Tobacco abuse 12/08/2017   Chronic obstructive pulmonary disease (HCC) 03/04/2017   Postmenopausal bone loss 05/29/2014   Migraine aura, persistent, intractable 05/24/2014   CKD (chronic kidney disease), 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   Atrophic vaginitis 08/28/2012   Irritable bowel syndrome (IBS) 06/07/2012   Restless leg syndrome 05/28/2012   Anxiety 05/28/2012   Hypertension, essential, benign 05/28/2012   Hyperlipidemia 05/28/2012   Barrett's esophagus 05/20/2012   Conditions to be addressed/monitored per PCP order:  Chronic healthcare management needs, CAD, HTN, HLD, COPD,  CKD, chronic pain, RLS, gout, asthma, migraines, IBS  Care Plan : RN Care Manager Plan of Care  Updates made by Danie Chandler, RN since 07/22/2023 12:00 AM     Problem: Health Promotion or Disease Self-Management (General Plan of Care)      Goal: Establish Plan of Care for Chronic Disease Management Needs   Priority: High  Note:   Timeframe:  Long-Range Goal Priority:  High Start Date:      12/10/22                       Expected End Date:    ongoing                  Follow Up Date 09/21/23   - schedule appointment for flu shot - schedule appointment for vaccines needed due to my age or health - schedule recommended health tests (blood work, mammogram, colonoscopy, pap test) - schedule and keep appointment for annual check-up    Why is this important?   Screening tests can find diseases early when they are easier to treat.  Your doctor or nurse will talk with you about which tests are important for you.  Getting shots for common diseases like the flu and shingles will help prevent them. 07/22/23:  Seen by [facility] 8/14-no upcoming appts   Long-Range Goal: Chronic Disease Management   Start Date: 12/10/2022  Expected End Date: 10/22/2023  Priority: High  Note:   .Current Barriers:  Knowledge Deficits related to plan of care for management of CAD, HTN, HLD, COPD, CKD Stage 2, Gout, Asthma, and syncope, chronic pain, RLS, migraines, IBS, vaping  Chronic Disease Management support and education needs related to CAD, HTN, HLD, COPD, CKD Stage 2, Gout, Asthma, and syncope, chronic pain, RLS, migraines, IBS, vaping 07/22/23:  Patient with no complaints today.  BP [id number].  Breathing WNL.  No change in vertigo, syncope.  Chronic pain in back.    RNCM Clinical Goal(s):  Patient will verbalize understanding of plan for management of CAD, HTN, HLD, COPD, CKD Stage 2, gout, Asthma, and syncope, chronic pain, RLS, migraines, IBS,vaping as evidenced by patient report and taking all  medications verbalize basic understanding of  CAD, HTN, HLD, COPD, CKD Stage 2, Gout, Asthma, and syncope, chronic pain, RLS, migraines,IBS,  vaping  disease process and self health management plan as evidenced by patient report and taking all medications take all medications exactly as prescribed and will call provider for medication related questions as evidenced by patient report demonstrate understanding of rationale for each prescribed medication as evidenced by patient report attend all scheduled medical appointments as evidenced by patient report demonstrate Improved adherence to prescribed treatment plan for CAD, HTN, HLD, COPD, CKD Stage 2, Gout, Asthma, and syncope, chronic pain, RLS,  migraines, IBS, vaping  as evidenced by patient report and taking all medications continue to work with RN Care Manager to address care management and care coordination needs related to  CAD, HTN, HLD, COPD, CKD Stage 2, Gout, and Asthma, syncope, chronic pain, RLS, migraines, IBS, vaping as evidenced by adherence to CM Team Scheduled appointments through collaboration with RN Care manager, provider, and care team.   Interventions: Inter-disciplinary care team collaboration (see longitudinal plan of care) Evaluation of current treatment plan related to  self management and patient's adherence to plan as established by provider  Asthma: (Status:New goal.) Long Term Goal Provided education about and advised patient to utilize infection prevention strategies to reduce risk of respiratory infection Discussed the importance of adequate rest and management of fatigue with Asthma Assessed social determinant of health barriers   CAD Interventions: (Status:  New goal.) Long Term Goal Assessed understanding of CAD diagnosis Medications reviewed including medications utilized in CAD treatment plan Reviewed Importance of taking all medications as prescribed Reviewed Importance of attending all scheduled provider  appointments Advised patient to discuss medications with provider Assessed social determinant of health barriers   Chronic Kidney Disease Interventions:  (Status:  New goal.) Long Term Goal Evaluation of current treatment plan related to chronic kidney disease self management and patient's adherence to plan as established by provider      Reviewed medications with patient and discussed importance of compliance    Advised patient, providing education and rationale, to monitor blood pressure daily and record, calling PCP for findings outside established parameters    Reviewed scheduled/upcoming provider appointments including    Discussed plans with patient for ongoing care management follow up and provided patient with direct contact information for care management team    Assessed social determinant of health barriers    Last practice recorded BP readings:  BP Readings from Last 3 Encounters:  11/19/22 98/60  11/10/22 (!) 146/73  11/10/22 (!) 140/84  01/26/23         129/84  Most recent eGFR/CrCl: No results found for: "EGFR"  No components found for: "CRCL"  COPD Interventions:  (Status:  New goal.) Long Term Goal Provided education about and advised patient to utilize infection prevention strategies to reduce risk of respiratory infection Discussed the importance of adequate rest and management of fatigue with COPD Assessed social determinant of health barriers  Hyperlipidemia Interventions:  (Status:  New goal.) Long Term Goal Medication review performed; medication list updated in electronic medical record.  Assessed social determinant of health barriers   Hypertension Interventions:  (Status:  New goal.) Long Term Goal Last practice recorded BP readings:  BP Readings from Last 3 Encounters:  07/22/23:         147-829/56-21 03/06/23:           135-165/76-93 04/06/23            129-167/71-82  Most recent eGFR/CrCl: No results found for: "EGFR"  No components found for:  "CRCL"  Evaluation of current treatment plan related to hypertension self management and patient's adherence to plan as established by provider Reviewed medications with patient and discussed importance of compliance Discussed plans with patient for ongoing care management follow up and provided patient with direct contact information for care management team Advised patient, providing education and rationale, to monitor blood pressure daily and record, calling PCP for findings outside established parameters Reviewed scheduled/upcoming provider appointments including:  Advised patient to discuss medication  with provider Assessed social determinant of health barriers  Pain Interventions:  (Status:  New goal.) Long Term Goal Pain assessment performed Medications reviewed Reviewed provider established plan for pain management Discussed importance of adherence to all scheduled medical appointments Counseled on the importance of reporting any/all new or changed pain symptoms or management strategies to pain management provider Advised patient to report to care team affect of pain on daily activities Discussed use of relaxation techniques and/or diversional activities to assist with pain reduction (distraction, imagery, relaxation, massage, acupressure, TENS, heat, and cold application Reviewed with patient prescribed pharmacological and nonpharmacological pain relief strategies Assessed social determinant of health barriers  Patient Goals/Self-Care Activities: Take all medications as prescribed Attend all scheduled provider appointments Call pharmacy for medication refills 3-7 days in advance of running out of medications Perform all self care activities independently  Perform IADL's (shopping, preparing meals, housekeeping, managing finances) independently Call provider office for new concerns or questions  Patient to follow up with provider regarding cough, BP medication,  RLS-completed  Follow Up Plan:  The patient has been provided with contact information for the care management team and has been advised to call with any health related questions or concerns.  The care management team will reach out to the patient again over the next 60 business  days.    Follow Up:  Patient agrees to Care Plan and Follow-up.  Plan: The Managed Medicaid care management team will reach out to the patient again over the next 60 business  days. and The  Patient has been provided with contact information for the Managed Medicaid care management team and has been advised to call with any health related questions or concerns.  Date/time of next scheduled RN care management/care coordination outreach: 09/21/23 at 0900

## 2023-07-22 NOTE — Patient Instructions (Signed)
Visit Information  Ms. Stuedemann was given information about Medicaid Managed Care team care coordination services as a part of their Healthy [facility] Medicaid benefit. [name] verbally consented to engagement with the [facility] Managed Care team.   If you are experiencing a medical emergency, please call 911 or report to your local emergency department or urgent care.   If you have a non-emergency medical problem during routine business hours, please contact your provider's office and ask to speak with a nurse.   For questions related to your Healthy [facility] health plan, please call: [phone] or visit the homepage here: [email or web address]  If you would like to schedule transportation through your Healthy [facility] plan, please call the following number at least 2 days in advance of your appointment: [phone]  For information about your ride after you set it up, call Ride Assist at [phone]. Use this number to activate a Will Call pickup, or if your transportation is late for a scheduled pickup. Use this number, too, if you need to make a change or cancel a previously scheduled reservation.  If you need transportation services right away, call [phone]. The after-hours call center is staffed 24 hours to handle ride assistance and urgent reservation requests (including discharges) 365 days a year. Urgent trips include sick visits, hospital discharge requests and life-sustaining treatment.  Call the [facility] Line at [phone], at any time, 24 hours a day, 7 days a week. If you are in danger or need immediate medical attention call 911.  If you would like help to quit smoking, call 1-800-QUIT-NOW ((731)371-6032) OR Espaol: 1-855-Djelo-Ya (4-742-595-6387) o para ms informacin haga clic aqu or Text READY to 564-332 to register via text  Ms. Gombert - following are the goals we discussed in your visit today:   Goals  Addressed    Timeframe:  Long-Range Goal Priority:  High Start Date:      12/10/22                       Expected End Date:    ongoing                  Follow Up Date 09/21/23   - schedule appointment for flu shot - schedule appointment for vaccines needed due to my age or health - schedule recommended health tests (blood work, mammogram, colonoscopy, pap test) - schedule and keep appointment for annual check-up    Why is this important?   Screening tests can find diseases early when they are easier to treat.  Your doctor or nurse will talk with you about which tests are important for you.  Getting shots for common diseases like the flu and shingles will help prevent them. 07/22/23:  Seen by CARDS 8/14-no upcoming appts  Patient verbalizes understanding of instructions and care plan provided today and agrees to view in MyChart. Active MyChart status and patient understanding of how to access instructions and care plan via MyChart confirmed with patient.     The Managed Medicaid care management team will reach out to the patient again over the next 60 business  days.  The  Patient  has been provided with contact information for the Managed Medicaid care management team and has been advised to call with any health related questions or concerns.   Kathi Der RN, [name]   [facility] Care Management Coordinator - Managed Medicaid High Risk [phone]   Following is a copy of your  plan of care:  Care Plan : RN Care Manager Plan of Care  Updates made by Danie Chandler, RN since 07/22/2023 12:00 AM     Long-Range Goal: Chronic Disease Management   Start Date: 12/10/2022  Expected End Date: 10/22/2023  Priority: High  Note:   .Current Barriers:  Knowledge Deficits related to plan of care for management of CAD, HTN, HLD, COPD, CKD Stage 2, Gout, Asthma, and syncope, chronic pain, RLS, migraines, IBS, vaping  Chronic Disease Management support and education needs  related to CAD, HTN, HLD, COPD, CKD Stage 2, Gout, Asthma, and syncope, chronic pain, RLS, migraines, IBS, vaping 07/22/23:  Patient with no complaints today.  BP [phone].  Breathing WNL.  No change in vertigo, syncope.  Chronic pain in back.     RNCM Clinical Goal(s):  Patient will verbalize understanding of plan for management of CAD, HTN, HLD, COPD, CKD Stage 2, gout, Asthma, and syncope, chronic pain, RLS, migraines, IBS,vaping as evidenced by patient report and taking all medications verbalize basic understanding of  CAD, HTN, HLD, COPD, CKD Stage 2, Gout, Asthma, and syncope, chronic pain, RLS, migraines,IBS,  vaping  disease process and self health management plan as evidenced by patient report and taking all medications take all medications exactly as prescribed and will call provider for medication related questions as evidenced by patient report demonstrate understanding of rationale for each prescribed medication as evidenced by patient report attend all scheduled medical appointments as evidenced by patient report demonstrate Improved adherence to prescribed treatment plan for CAD, HTN, HLD, COPD, CKD Stage 2, Gout, Asthma, and syncope, chronic pain, RLS, migraines, IBS, vaping  as evidenced by patient report and taking all medications continue to work with RN Care Manager to address care management and care coordination needs related to  CAD, HTN, HLD, COPD, CKD Stage 2, Gout, and Asthma, syncope, chronic pain, RLS, migraines, IBS, vaping as evidenced by adherence to CM Team Scheduled appointments through collaboration with RN Care manager, provider, and care team.   Interventions: Inter-disciplinary care team collaboration (see longitudinal plan of care) Evaluation of current treatment plan related to  self management and patient's adherence to plan as established by provider  Asthma: (Status:New goal.) Long Term Goal Provided education about and advised patient to utilize  infection prevention strategies to reduce risk of respiratory infection Discussed the importance of adequate rest and management of fatigue with Asthma Assessed social determinant of health barriers   CAD Interventions: (Status:  New goal.) Long Term Goal Assessed understanding of CAD diagnosis Medications reviewed including medications utilized in CAD treatment plan Reviewed Importance of taking all medications as prescribed Reviewed Importance of attending all scheduled provider appointments Advised patient to discuss medications with provider Assessed social determinant of health barriers   Chronic Kidney Disease Interventions:  (Status:  New goal.) Long Term Goal Evaluation of current treatment plan related to chronic kidney disease self management and patient's adherence to plan as established by provider      Reviewed medications with patient and discussed importance of compliance    Advised patient, providing education and rationale, to monitor blood pressure daily and record, calling PCP for findings outside established parameters    Reviewed scheduled/upcoming provider appointments including    Discussed plans with patient for ongoing care management follow up and provided patient with direct contact information for care management team    Assessed social determinant of health barriers    Last practice recorded BP readings:  BP Readings from Last 3  Encounters:  11/19/22 98/60  11/10/22 (!) 146/73  11/10/22 (!) 140/84  01/26/23         129/84  Most recent eGFR/CrCl: No results found for: "EGFR"  No components found for: "CRCL"  COPD Interventions:  (Status:  New goal.) Long Term Goal Provided education about and advised patient to utilize infection prevention strategies to reduce risk of respiratory infection Discussed the importance of adequate rest and management of fatigue with COPD Assessed social determinant of health barriers  Hyperlipidemia Interventions:  (Status:   New goal.) Long Term Goal Medication review performed; medication list updated in electronic medical record.  Assessed social determinant of health barriers   Hypertension Interventions:  (Status:  New goal.) Long Term Goal Last practice recorded BP readings:  BP Readings from Last 3 Encounters:  07/22/23:         010-272/53-66 03/06/23:           135-165/76-93 04/06/23            129-167/71-82  Most recent eGFR/CrCl: No results found for: "EGFR"  No components found for: "CRCL"  Evaluation of current treatment plan related to hypertension self management and patient's adherence to plan as established by provider Reviewed medications with patient and discussed importance of compliance Discussed plans with patient for ongoing care management follow up and provided patient with direct contact information for care management team Advised patient, providing education and rationale, to monitor blood pressure daily and record, calling PCP for findings outside established parameters Reviewed scheduled/upcoming provider appointments including:  Advised patient to discuss medication  with provider Assessed social determinant of health barriers  Pain Interventions:  (Status:  New goal.) Long Term Goal Pain assessment performed Medications reviewed Reviewed provider established plan for pain management Discussed importance of adherence to all scheduled medical appointments Counseled on the importance of reporting any/all new or changed pain symptoms or management strategies to pain management provider Advised patient to report to care team affect of pain on daily activities Discussed use of relaxation techniques and/or diversional activities to assist with pain reduction (distraction, imagery, relaxation, massage, acupressure, TENS, heat, and cold application Reviewed with patient prescribed pharmacological and nonpharmacological pain relief strategies Assessed social determinant of health  barriers  Patient Goals/Self-Care Activities: Take all medications as prescribed Attend all scheduled provider appointments Call pharmacy for medication refills 3-7 days in advance of running out of medications Perform all self care activities independently  Perform IADL's (shopping, preparing meals, housekeeping, managing finances) independently Call provider office for new concerns or questions  Patient to follow up with provider regarding cough, BP medication, RLS-completed  Follow Up Plan:  The patient has been provided with contact information for the care management team and has been advised to call with any health related questions or concerns.  The care management team will reach out to the patient again over the next 60 business  days.

## 2023-07-27 ENCOUNTER — Other Ambulatory Visit: Payer: Self-pay | Admitting: *Deleted

## 2023-07-27 NOTE — Telephone Encounter (Signed)
Received refill for Amlodipine, however, med was d/c'd.

## 2023-08-07 ENCOUNTER — Other Ambulatory Visit: Payer: Self-pay | Admitting: Cardiology

## 2023-08-10 ENCOUNTER — Other Ambulatory Visit: Payer: Self-pay | Admitting: Cardiology

## 2023-09-01 ENCOUNTER — Encounter: Payer: Self-pay | Admitting: Family Medicine

## 2023-09-01 ENCOUNTER — Ambulatory Visit: Payer: Medicaid Other | Admitting: Family Medicine

## 2023-09-01 VITALS — BP 124/74 | HR 86 | Wt 150.0 lb

## 2023-09-01 DIAGNOSIS — Z23 Encounter for immunization: Secondary | ICD-10-CM

## 2023-09-01 DIAGNOSIS — Z0189 Encounter for other specified special examinations: Secondary | ICD-10-CM

## 2023-09-01 NOTE — Patient Instructions (Signed)
Good to see you today - Thank you for coming in  You received your flu shot today. Please return with any new or worsening concerns.

## 2023-09-01 NOTE — Progress Notes (Signed)
    SUBJECTIVE:   CHIEF COMPLAINT / HPI:   Pt presented for labwork. States that her pain medication doctor told her she needed labs done. She is unsure what labs these are and why they need to be done. Per chart review, it looks like the only pain medication is hydrocodone-acetaminophen 5-325mg .   PERTINENT  PMH / PSH: HTN, migraine with aura, carotid stenosis, PSVT, COPD, barrett's esophagus, CKD2, RLS, HLD  OBJECTIVE:   BP 124/74   Pulse 86   Wt 150 lb (68 kg)   SpO2 98%   BMI 28.34 kg/m   Gen: well appearing, no acute distress  ASSESSMENT/PLAN:   Encounter for laboratory test Presented for lab work for her pain doctor.  States that her pain doctor wanted labs done but she is unsure what labs these were.  [facility], they really just wanted her recent labs sent over to them.  Provided them our fax number for medical records to be sent. Patient did receive her flu shot today.   Pt planning to switch PCPs in November to someone closer to home.   Para March, DO [facility]

## 2023-09-01 NOTE — Assessment & Plan Note (Signed)
Presented for lab work for her pain doctor.  States that her pain doctor wanted labs done but she is unsure what labs these were.  Got [facility] touch with [facility], they really just wanted her recent labs sent over to them.  Provided them our fax number for medical records to be sent. Patient did receive her flu shot today.

## 2023-09-21 ENCOUNTER — Other Ambulatory Visit: Payer: Self-pay | Admitting: Obstetrics and Gynecology

## 2023-09-21 NOTE — Patient Instructions (Signed)
Hi Diana [name] for the updates-I hope your week goes well and you get some relief.  I will check back with you-have a nice day and week ahead!  Diana [name] was given information about Medicaid Managed Care team care coordination services as a part of their Healthy [facility] Medicaid benefit. Diana [name] verbally consented to engagement with the [facility] Managed Care team.   If you are experiencing a medical emergency, please call 911 or report to your local emergency department or urgent care.   If you have a non-emergency medical problem during routine business hours, please contact your provider's office and ask to speak with a nurse.   For questions related to your Healthy [facility] health plan, please call: [phone] or visit the homepage here: [email or web address]  If you would like to schedule transportation through your Healthy [facility] plan, please call the following number at least 2 days in advance of your appointment: [phone]  For information about your ride after you set it up, call Ride Assist at [phone]. Use this number to activate a Will Call pickup, or if your transportation is late for a scheduled pickup. Use this number, too, if you need to make a change or cancel a previously scheduled reservation.  If you need transportation services right away, call [phone]. The after-hours call center is staffed 24 hours to handle ride assistance and urgent reservation requests (including discharges) 365 days a year. Urgent trips include sick visits, hospital discharge requests and life-sustaining treatment.  Call the [facility] Line at [phone], at any time, 24 hours a day, 7 days a week. If you are in danger or need immediate medical attention call 911.  If you would like help to quit smoking, call 1-800-QUIT-NOW ((717)101-1182) OR Espaol: 1-855-Djelo-Ya (8-756-433-2951) o para ms informacin haga clic aqu or  Text READY to 200-400 to register via text  Diana [name] - following are the goals we discussed in your visit today:   Goals Addressed    Timeframe:  Long-Range Goal Priority:  High Start Date:      12/10/22                       Expected End Date:    ongoing                  Follow Up Date 10/22/23   - schedule appointment for flu shot - schedule appointment for vaccines needed due to my age or health - schedule recommended health tests (blood work, mammogram, colonoscopy, pap test) - schedule and keep appointment for annual check-up    Why is this important?   Screening tests can find diseases early when they are easier to treat.  Your doctor or nurse will talk with you about which tests are important for you.  Getting shots for common diseases like the flu and shingles will help prevent them. 09/21/23:  Appointments for therapeutic massage and for back this week.  Patient verbalizes understanding of instructions and care plan provided today and agrees to view in MyChart. Active MyChart status and patient understanding of how to access instructions and care plan via MyChart confirmed with patient.     The Managed Medicaid care management team will reach out to the patient again over the next 30 business days.  The  Patient has been provided with contact information for the Managed Medicaid care management team and has been advised to call with any health related questions or concerns.  Kathi Der RN, [name] [place]  [facility] Care Management Coordinator - Managed Medicaid High Risk [phone]   Following is a copy of your plan of care:  Care Plan : RN Care Manager Plan of Care  Updates made by Danie Chandler, RN since 09/21/2023 12:00 AM     Problem: Health Promotion or Disease Self-Management (General Plan of Care)      Long-Range Goal: Chronic Disease Management   Start Date: 12/10/2022  Expected End Date: 10/22/2023  Priority: High  Note:   .Current  Barriers:  Knowledge Deficits related to plan of care for management of CAD, HTN, HLD, COPD, CKD Stage 2, Gout, Asthma, and syncope, chronic pain, RLS, migraines, IBS, vaping  Chronic Disease Management support and education needs related to CAD, HTN, HLD, COPD, CKD Stage 2, Gout, Asthma, and syncope, chronic pain, RLS, migraines, IBS, vaping 09/21/23:  Increasing sciatica for about a month-burning and stinging-has appointments this week.  BP stable with meds.  Vapes daily.   RNCM Clinical Goal(s):  Patient will verbalize understanding of plan for management of CAD, HTN, HLD, COPD, CKD Stage 2, gout, Asthma, and syncope, chronic pain, RLS, migraines, IBS,vaping as evidenced by patient report and taking all medications verbalize basic understanding of  CAD, HTN, HLD, COPD, CKD Stage 2, Gout, Asthma, and syncope, chronic pain, RLS, migraines,IBS,  vaping  disease process and self health management plan as evidenced by patient report and taking all medications take all medications exactly as prescribed and will call provider for medication related questions as evidenced by patient report demonstrate understanding of rationale for each prescribed medication as evidenced by patient report attend all scheduled medical appointments as evidenced by patient report demonstrate Improved adherence to prescribed treatment plan for CAD, HTN, HLD, COPD, CKD Stage 2, Gout, Asthma, and syncope, chronic pain, RLS, migraines, IBS, vaping  as evidenced by patient report and taking all medications continue to work with RN Care Manager to address care management and care coordination needs related to  CAD, HTN, HLD, COPD, CKD Stage 2, Gout, and Asthma, syncope, chronic pain, RLS, migraines, IBS, vaping as evidenced by adherence to CM Team Scheduled appointments through collaboration with RN Care manager, provider, and care team.   Interventions: Inter-disciplinary care team collaboration (see longitudinal plan of  care) Evaluation of current treatment plan related to  self management and patient's adherence to plan as established by provider  Asthma: (Status:New goal.) Long Term Goal Provided education about and advised patient to utilize infection prevention strategies to reduce risk of respiratory infection Discussed the importance of adequate rest and management of fatigue with Asthma Assessed social determinant of health barriers   CAD Interventions: (Status:  New goal.) Long Term Goal Assessed understanding of CAD diagnosis Medications reviewed including medications utilized in CAD treatment plan Reviewed Importance of taking all medications as prescribed Reviewed Importance of attending all scheduled provider appointments Advised patient to discuss medications with provider Assessed social determinant of health barriers   Chronic Kidney Disease Interventions:  (Status:  New goal.) Long Term Goal Evaluation of current treatment plan related to chronic kidney disease self management and patient's adherence to plan as established by provider      Reviewed medications with patient and discussed importance of compliance    Advised patient, providing education and rationale, to monitor blood pressure daily and record, calling PCP for findings outside established parameters    Reviewed scheduled/upcoming provider appointments including    Discussed plans with patient for ongoing care  management follow up and provided patient with direct contact information for care management team    Assessed social determinant of health barriers    Last practice recorded BP readings:  BP Readings from Last 3 Encounters:     09/01/23 124/74  11/10/22 (!) 140/84  01/26/23         129/84  Most recent eGFR/CrCl: No results found for: "EGFR"  No components found for: "CRCL"  COPD Interventions:  (Status:  New goal.) Long Term Goal Provided education about and advised patient to utilize infection prevention  strategies to reduce risk of respiratory infection Discussed the importance of adequate rest and management of fatigue with COPD Assessed social determinant of health barriers  Hyperlipidemia Interventions:  (Status:  New goal.) Long Term Goal Medication review performed; medication list updated in electronic medical record.  Assessed social determinant of health barriers   Hypertension Interventions:  (Status:  New goal.) Long Term Goal Last practice recorded BP readings:  BP Readings from Last 3 Encounters:  07/22/23:         138-146/79-83 09/01/23        124/74 04/06/23            129-167/71-82  Most recent eGFR/CrCl: No results found for: "EGFR"  No components found for: "CRCL"  Evaluation of current treatment plan related to hypertension self management and patient's adherence to plan as established by provider Reviewed medications with patient and discussed importance of compliance Discussed plans with patient for ongoing care management follow up and provided patient with direct contact information for care management team Advised patient, providing education and rationale, to monitor blood pressure daily and record, calling PCP for findings outside established parameters Reviewed scheduled/upcoming provider appointments including:  Advised patient to discuss medication  with provider Assessed social determinant of health barriers  Pain Interventions:  (Status:  New goal.) Long Term Goal Pain assessment performed Medications reviewed Reviewed provider established plan for pain management Discussed importance of adherence to all scheduled medical appointments Counseled on the importance of reporting any/all new or changed pain symptoms or management strategies to pain management provider Advised patient to report to care team affect of pain on daily activities Discussed use of relaxation techniques and/or diversional activities to assist with pain reduction (distraction, imagery,  relaxation, massage, acupressure, TENS, heat, and cold application Reviewed with patient prescribed pharmacological and nonpharmacological pain relief strategies Assessed social determinant of health barriers  Patient Goals/Self-Care Activities: Take all medications as prescribed Attend all scheduled provider appointments Call pharmacy for medication refills 3-7 days in advance of running out of medications Perform all self care activities independently  Perform IADL's (shopping, preparing meals, housekeeping, managing finances) independently Call provider office for new concerns or questions  Patient to follow up with provider regarding cough, BP medication, RLS-completed  Follow Up Plan:  The patient has been provided with contact information for the care management team and has been advised to call with any health related questions or concerns.  The care management team will reach out to the patient again over the next 60 business  days.

## 2023-09-21 NOTE — Patient Outreach (Signed)
Medicaid Managed Care   Nurse Care Manager Note  09/21/2023 Name:  Diana [name] MRN:  [id number] DOB:  02-Sep-1956  Diana [name] is an 67 y.o. year old female who is a primary patient of Everhart, Kirstie, DO.  The [facility] Managed Care Coordination team was consulted for assistance with:    Chronic healthcare management needs, CAD, HTN, HLD, COPD, CKD, chronic pain, RLS, gout, asthma, migraines, IBS  Diana [name] was given information about Medicaid Managed Care Coordination team services today. [name] Patient agreed to services and verbal consent obtained.  Engaged with patient by telephone for follow up visit in response to provider referral for case management and/or care coordination services.   Assessments/Interventions:  Review of past medical history, allergies, medications, health status, including review of consultants reports, laboratory and other test data, was performed as part of comprehensive evaluation and provision of chronic care management services.  SDOH (Social Determinants of Health) assessments and interventions performed: SDOH Interventions    Flowsheet Row Patient Outreach Telephone from 09/21/2023 in [place] POPULATION HEALTH DEPARTMENT Patient Outreach Telephone from 07/22/2023 in [place] POPULATION HEALTH DEPARTMENT Patient Outreach Telephone from 06/05/2023 in [place] POPULATION HEALTH DEPARTMENT Patient Outreach Telephone from 04/06/2023 in [place] POPULATION HEALTH DEPARTMENT Patient Outreach Telephone from 03/06/2023 in [place] POPULATION HEALTH DEPARTMENT Patient Outreach Telephone from 02/04/2023 in [place] POPULATION HEALTH DEPARTMENT  SDOH Interventions        Food Insecurity Interventions Intervention Not Indicated -- -- -- -- --  Housing Interventions -- -- -- -- Intervention Not Indicated --  Transportation Interventions -- Intervention Not Indicated -- -- -- --  Utilities Interventions Intervention Not Indicated -- -- -- -- Intervention  Not Indicated  Alcohol Usage Interventions Intervention Not Indicated (Score <7) -- -- -- -- Intervention Not Indicated (Score <7)  Financial Strain Interventions -- -- -- Intervention Not Indicated -- --  Physical Activity Interventions -- -- -- --  [declined] -- --  Stress Interventions -- -- Intervention Not Indicated -- -- --  Health Literacy Interventions -- Intervention Not Indicated -- -- -- --     Care Plan Allergies  Allergen Reactions   Penicillins Anaphylaxis   Demerol [Meperidine] Nausea And Vomiting   Doxycycline Hyclate Nausea And Vomiting   Gabapentin Other (See Comments)    Malaise   Sulfa Antibiotics Hives   Atorvastatin     myalgias   Fluvastatin     myalgia   Levaquin [Levofloxacin]     Reported some possible facial swelling with administration 05/2018   Lovastatin     myalgia   No Known Allergies     Other reaction(s): Dizziness   Pitavastatin     myalgia   Pravastatin     myalgia   Rosuvastatin     myalgia   Simvastatin     myalgia   Benadryl [Diphenhydramine Hcl] Palpitations and Rash   Buprenorphine Hcl Nausea And Vomiting   Cephalexin Rash   Morphine And Codeine Nausea And Vomiting   Spiriva Handihaler [Tiotropium Bromide Monohydrate] Rash   Medications Reviewed Today     Reviewed by Danie Chandler, RN (Registered Nurse) on 09/21/23 at 0940  Med List Status: <None>   Medication Order Taking? Sig Documenting Provider Last Dose Status Informant  albuterol (VENTOLIN HFA) 108 (90 Base) MCG/ACT inhaler [id number] No Inhale 2 puffs into the lungs every 4 (four) hours as needed for wheezing or shortness of breath. [name], [name], DO Taking Active   Alirocumab (PRALUENT)  150 MG/ML SOAJ [id number] No Inject 150 mg into the skin every 14 (fourteen) days. [name], [name], DO Taking Active   allopurinol (ZYLOPRIM) 100 MG tablet [id number] No Take 1 tablet by mouth daily. [name], MD Taking Active   AMITIZA 24 MCG capsule [id number] No Take 24 mcg by mouth  daily. [provider] Taking Active   aspirin EC 325 MG tablet [id number] No Take 325 mg by mouth daily. [provider] Taking Active   aspirin-acetaminophen-caffeine (EXCEDRIN MIGRAINE) 279-120-7656 MG tablet [id number] No Take 2 tablets by mouth every 6 (six) hours as needed for headache. [provider] Taking Active   azelastine (ASTELIN) 0.1 % nasal spray [id number] No Place into the nose in the morning and at bedtime. [provider] Taking Active   azithromycin (ZITHROMAX Z-PAK) 250 MG tablet [id number] No Take 2 tablets on day 1 and then follow with 1 tablet daily for 4 more days [name], [name], DO Taking Active   clopidogrel (PLAVIX) 75 MG tablet [id number] No Take 75 mg by mouth daily. [provider] Taking Active   co-enzyme Q-10 30 MG capsule [id number] No Take 100 mg by mouth daily. [provider] Taking Active   Cyanocobalamin (B-12) 3000 MCG CAPS [id number] No Take 1 capsule by mouth daily. [provider] Taking Active   diclofenac sodium (VOLTAREN) 1 % GEL [id number] No Apply 2 g topically 4 (four) times daily as needed. Shon Hale, MD Taking Active   esomeprazole (NEXIUM) 40 MG capsule [id number] No TAKE 1 CAPSULE BY MOUTH DAILY AT 12 [name], [name], MD Taking Active   famotidine (PEPCID) 20 MG tablet [id number] No One after supper [name], MD Taking Active   HYDROcodone-acetaminophen (NORCO/VICODIN) 5-325 MG tablet [id number] No Take 1 tablet by mouth every 8 (eight) hours as needed. [provider] Taking Active   lidocaine (HM LIDOCAINE PATCH) 4 % [id number] No Place 1 patch onto the skin daily. [name], MD Taking Active   linaclotide [facility]) 145 MCG CAPS capsule [id number] No TAKE 1 CAPSULE BY MOUTH DAILY BEFORE Erskine Speed, MD Taking Active   naloxone [facility]) nasal spray 4 mg/0.1 mL [id number] No Place into the nose as needed. overdose [provider] Taking  Active   olmesartan (BENICAR) 5 MG tablet [id number] No Take 2 tablets (10 mg total) by mouth 2 (two) times daily. [name], DO Taking Active Self  Pitavastatin Calcium 1 MG TABS [id number] No Take 1 tablet (1 mg total) by mouth 2 (two) times a week. [name], [name], DO Taking Active   SUMAtriptan (IMITREX) 50 MG tablet [id number] No Take 1 tablet by mouth every 2 hours as needed for migraine. May repeat in 2 hours if headache persists or recurs. [name], MD Taking Active   Turmeric 500 MG CAPS [id number] No Take 2 tablets by mouth daily. [provider] Taking Active   venlafaxine XR (EFFEXOR-XR) 37.5 MG 24 hr capsule [id number] No Take 1 capsule by mouth daily with breakfast. 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 12/26/2021   Encounter for hearing screening with abnormal findings 06/14/2021   Rotator cuff tear, non-traumatic, right 05/21/2021   Lung nodule 09/19/2020   Dry eye syndrome of both eyes 01/15/2020   Memory change 02/07/2019   Tobacco abuse 12/08/2017   Chronic obstructive pulmonary disease (HCC) 03/04/2017   Postmenopausal bone loss 05/29/2014   Migraine aura, persistent, intractable 05/24/2014   CKD (chronic kidney disease), 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   Atrophic vaginitis 08/28/2012   Irritable bowel syndrome (IBS) 06/07/2012   Restless leg syndrome 05/28/2012   Anxiety 05/28/2012   Hypertension, essential, benign 05/28/2012   Hyperlipidemia 05/28/2012   Barrett's esophagus 05/20/2012   Conditions to be  addressed/monitored per PCP order:  Chronic healthcare management needs, CAD, HTN, HLD, COPD, CKD, chronic pain, RLS, gout, asthma, migraines, IBS  Care Plan : RN Care Manager Plan of Care  Updates made by Danie Chandler, RN since 09/21/2023 12:00 AM     Problem: Health Promotion or Disease Self-Management (General Plan of Care)      Long-Range Goal: Chronic Disease Management   Start Date: 12/10/2022  Expected End Date: 10/22/2023  Priority: High  Note:   .Current Barriers:  Knowledge Deficits related to plan of care for management of CAD, HTN, HLD, COPD, CKD Stage 2, Gout, Asthma, and syncope, chronic pain, RLS, migraines, IBS, vaping  Chronic Disease Management support and education needs related to CAD, HTN, HLD, COPD, CKD Stage 2, Gout, Asthma, and syncope, chronic pain, RLS, migraines, IBS, vaping 09/21/23:  Increasing sciatica for about a month-burning and stinging-has appointments this week.  BP stable with meds.  Vapes daily.   RNCM Clinical Goal(s):  Patient will verbalize understanding of plan for management of CAD, HTN, HLD, COPD, CKD Stage 2, gout, Asthma, and syncope, chronic pain, RLS, migraines, IBS,vaping as evidenced by patient report and taking all medications verbalize basic understanding of  CAD, HTN, HLD, COPD, CKD Stage 2, Gout, Asthma, and syncope, chronic pain, RLS, migraines,IBS,  vaping  disease process and self health management plan as evidenced by patient report and taking all medications take all medications exactly as prescribed and will call provider for medication related questions as evidenced by patient report demonstrate understanding of rationale for each prescribed medication as evidenced by patient report attend all scheduled medical appointments as evidenced by patient report demonstrate Improved adherence to prescribed treatment plan for CAD, HTN, HLD, COPD, CKD Stage 2, Gout, Asthma, and syncope, chronic pain, RLS, migraines, IBS, vaping  as  evidenced by patient report and taking all medications continue to work with RN Care Manager to address care management and care coordination needs related to  CAD, HTN, HLD, COPD, CKD Stage 2, Gout, and Asthma, syncope, chronic pain, RLS, migraines, IBS, vaping as evidenced by adherence to CM Team Scheduled appointments through collaboration with RN Care manager, provider, and care team.   Interventions: Inter-disciplinary care team collaboration (see longitudinal plan of care) Evaluation of current treatment plan related to  self management and patient's adherence to plan as established by provider  Asthma: (Status:New goal.) Long Term Goal Provided education about and advised patient to utilize infection prevention strategies to reduce risk of respiratory infection Discussed the importance of adequate rest and management of fatigue with Asthma Assessed social determinant of health barriers   CAD Interventions: (Status:  New goal.) Long Term Goal Assessed understanding of CAD diagnosis Medications reviewed including medications utilized in CAD treatment plan Reviewed Importance of taking all medications as prescribed Reviewed Importance of  attending all scheduled provider appointments Advised patient to discuss medications with provider Assessed social determinant of health barriers   Chronic Kidney Disease Interventions:  (Status:  New goal.) Long Term Goal Evaluation of current treatment plan related to chronic kidney disease self management and patient's adherence to plan as established by provider      Reviewed medications with patient and discussed importance of compliance    Advised patient, providing education and rationale, to monitor blood pressure daily and record, calling PCP for findings outside established parameters    Reviewed scheduled/upcoming provider appointments including    Discussed plans with patient for ongoing care management follow up and provided patient with  direct contact information for care management team    Assessed social determinant of health barriers    Last practice recorded BP readings:  BP Readings from Last 3 Encounters:     09/01/23 124/74  11/10/22 (!) 140/84  01/26/23         129/84  Most recent eGFR/CrCl: No results found for: "EGFR"  No components found for: "CRCL"  COPD Interventions:  (Status:  New goal.) Long Term Goal Provided education about and advised patient to utilize infection prevention strategies to reduce risk of respiratory infection Discussed the importance of adequate rest and management of fatigue with COPD Assessed social determinant of health barriers  Hyperlipidemia Interventions:  (Status:  New goal.) Long Term Goal Medication review performed; medication list updated in electronic medical record.  Assessed social determinant of health barriers   Hypertension Interventions:  (Status:  New goal.) Long Term Goal Last practice recorded BP readings:  BP Readings from Last 3 Encounters:  07/22/23:         138-146/79-83 09/01/23        124/74 04/06/23            129-167/71-82  Most recent eGFR/CrCl: No results found for: "EGFR"  No components found for: "CRCL"  Evaluation of current treatment plan related to hypertension self management and patient's adherence to plan as established by provider Reviewed medications with patient and discussed importance of compliance Discussed plans with patient for ongoing care management follow up and provided patient with direct contact information for care management team Advised patient, providing education and rationale, to monitor blood pressure daily and record, calling PCP for findings outside established parameters Reviewed scheduled/upcoming provider appointments including:  Advised patient to discuss medication  with provider Assessed social determinant of health barriers  Pain Interventions:  (Status:  New goal.) Long Term Goal Pain assessment  performed Medications reviewed Reviewed provider established plan for pain management Discussed importance of adherence to all scheduled medical appointments Counseled on the importance of reporting any/all new or changed pain symptoms or management strategies to pain management provider Advised patient to report to care team affect of pain on daily activities Discussed use of relaxation techniques and/or diversional activities to assist with pain reduction (distraction, imagery, relaxation, massage, acupressure, TENS, heat, and cold application Reviewed with patient prescribed pharmacological and nonpharmacological pain relief strategies Assessed social determinant of health barriers  Patient Goals/Self-Care Activities: Take all medications as prescribed Attend all scheduled provider appointments Call pharmacy for medication refills 3-7 days in advance of running out of medications Perform all self care activities independently  Perform IADL's (shopping, preparing meals, housekeeping, managing finances) independently Call provider office for new concerns or questions  Patient to follow up with provider regarding cough, BP medication, RLS-completed  Follow Up Plan:  The patient has been provided with contact information  for the care management team and has been advised to call with any health related questions or concerns.  The care management team will reach out to the patient again over the next 60 business  days.     Long-Range Goal: Establish Plan of Care for Chronic Disease Management Needs   Priority: High  Note:   Timeframe:  Long-Range Goal Priority:  High Start Date:      12/10/22                       Expected End Date:    ongoing                  Follow Up Date 10/22/23   - schedule appointment for flu shot - schedule appointment for vaccines needed due to my age or health - schedule recommended health tests (blood work, mammogram, colonoscopy, pap test) - schedule and  keep appointment for annual check-up    Why is this important?   Screening tests can find diseases early when they are easier to treat.  Your doctor or nurse will talk with you about which tests are important for you.  Getting shots for common diseases like the flu and shingles will help prevent them. 09/21/23:  Appointments for therapeutic massage and for back this week.   Follow Up:  Patient agrees to Care Plan and Follow-up.  Plan: The Managed Medicaid care management team will reach out to the patient again over the next 30 business  days. and The  Patient has been provided with contact information for the Managed Medicaid care management team and has been advised to call with any health related questions or concerns.  Date/time of next scheduled RN care management/care coordination outreach:  10/22/23 at 230

## 2023-10-22 ENCOUNTER — Other Ambulatory Visit: Payer: Self-pay | Admitting: Obstetrics and Gynecology

## 2023-10-22 ENCOUNTER — Ambulatory Visit: Payer: Self-pay | Admitting: Obstetrics and Gynecology

## 2023-10-22 NOTE — Patient Outreach (Signed)
Medicaid Managed Care   Nurse Care Manager Note  10/22/2023 Name:  [name] MRN:  [id number] DOB:  [birth year]  Diana [name] is an 67 y.o. year old female who is a primary patient of Everhart, Kirstie, DO.  The [facility] Managed Care Coordination team was consulted for assistance with:    Chronic healthcare management needs, CAD, HLD, COPD, CKD, chronic pain, GERD, HTN, RLS, gout, asthma, migraines, IBS  Diana [name] was given information about Medicaid Managed Care Coordination team services today. [name] Patient agreed to services and verbal consent obtained.  Engaged with patient by telephone for follow up visit in response to provider referral for case management and/or care coordination services.   Assessments/Interventions:  Review of past medical history, allergies, medications, health status, including review of consultants reports, laboratory and other test data, was performed as part of comprehensive evaluation and provision of chronic care management services.  SDOH (Social Determinants of Health) assessments and interventions performed: SDOH Interventions    Flowsheet Row Patient Outreach Telephone from 10/22/2023 in [place] POPULATION HEALTH DEPARTMENT Patient Outreach Telephone from 09/21/2023 in [place] POPULATION HEALTH DEPARTMENT Patient Outreach Telephone from 07/22/2023 in [place] POPULATION HEALTH DEPARTMENT Patient Outreach Telephone from 06/05/2023 in [place] POPULATION HEALTH DEPARTMENT Patient Outreach Telephone from 04/06/2023 in [place] POPULATION HEALTH DEPARTMENT Patient Outreach Telephone from 03/06/2023 in  POPULATION HEALTH DEPARTMENT  SDOH Interventions        Food Insecurity Interventions -- Intervention Not Indicated -- -- -- --  Housing Interventions Intervention Not Indicated -- -- -- -- Intervention Not Indicated  Transportation Interventions -- -- Intervention Not Indicated -- -- --  Utilities Interventions -- Intervention Not  Indicated -- -- -- --  Alcohol Usage Interventions -- Intervention Not Indicated (Score <7) -- -- -- --  Financial Strain Interventions -- -- -- -- Intervention Not Indicated --  Physical Activity Interventions -- -- -- -- --  [declined] --  Stress Interventions -- -- -- Intervention Not Indicated -- --  Health Literacy Interventions -- -- Intervention Not Indicated -- -- --     Care Plan Allergies  Allergen Reactions   Penicillins Anaphylaxis   Demerol [Meperidine] Nausea And Vomiting   Doxycycline Hyclate Nausea And Vomiting   Gabapentin Other (See Comments)    Malaise   Sulfa Antibiotics Hives   Atorvastatin     myalgias   Fluvastatin     myalgia   Levaquin [Levofloxacin]     Reported some possible facial swelling with administration 05/2018   Lovastatin     myalgia   No Known Allergies     Other reaction(s): Dizziness   Pitavastatin     myalgia   Pravastatin     myalgia   Rosuvastatin     myalgia   Simvastatin     myalgia   Benadryl [Diphenhydramine Hcl] Palpitations and Rash   Buprenorphine Hcl Nausea And Vomiting   Cephalexin Rash   Morphine And Codeine Nausea And Vomiting   Spiriva Handihaler [Tiotropium Bromide Monohydrate] Rash   Medications Reviewed Today     Reviewed by Danie Chandler, RN (Registered Nurse) on 10/22/23 at 1039  Med List Status: <None>   Medication Order Taking? Sig Documenting Provider Last Dose Status Informant  albuterol (VENTOLIN HFA) 108 (90 Base) MCG/ACT inhaler [id number] No Inhale 2 puffs into the lungs every 4 (four) hours as needed for wheezing or shortness of breath. [name], [name], DO Taking Active   Alirocumab (PRALUENT) 150 MG/ML SOAJ  [id number] No Inject 150 mg into the skin every 14 (fourteen) days. [name], Diana Mound, DO Taking Active   allopurinol (ZYLOPRIM) 100 MG tablet [id number] No Take 1 tablet by mouth daily. Diana Dus, MD Taking Active   AMITIZA 24 MCG capsule [id number] No Take 24 mcg by mouth daily. [provider] Taking Active   aspirin EC 325 MG tablet [id number] No Take 325 mg by mouth daily. [provider] Taking Active   aspirin-acetaminophen-caffeine (EXCEDRIN MIGRAINE) [phone] MG tablet [id number] No Take 2 tablets by mouth every 6 (six) hours as needed for headache. [provider] Taking Active   azelastine (ASTELIN) 0.1 % nasal spray [id number] No Place into the nose in the morning and at bedtime. [provider] Taking Active   azithromycin (ZITHROMAX Z-PAK) 250 MG tablet [id number] No Take 2 tablets on day 1 and then follow with 1 tablet daily for 4 more days [name], [name], DO Taking Active   clopidogrel (PLAVIX) 75 MG tablet [id number] No Take 75 mg by mouth daily. [provider] Taking Active   co-enzyme Q-10 30 MG capsule [id number] No Take 100 mg by mouth daily. [provider] Taking Active   Cyanocobalamin (B-12) 3000 MCG CAPS [id number] No Take 1 capsule by mouth daily. [provider] Taking Active   diclofenac sodium (VOLTAREN) 1 % GEL [id number] No Apply 2 g topically 4 (four) times daily as needed. Diana Hale, MD Taking Active   esomeprazole (NEXIUM) 40 MG capsule [id number] No TAKE 1 CAPSULE BY MOUTH DAILY AT 12 Diana [name], [name], MD Taking Active   famotidine (PEPCID) 20 MG tablet [id number] No One after supper Diana Cowden, MD Taking Active   HYDROcodone-acetaminophen (NORCO/VICODIN) 5-325 MG tablet [id number] No Take 1 tablet by mouth every 8 (eight) hours as needed. [provider] Taking Active   lidocaine (HM LIDOCAINE PATCH) 4 % [id number] No Place 1 patch onto the skin daily. Diana Dus, MD Taking Active   linaclotide [facility]) 145 MCG CAPS capsule [id number] No TAKE 1 CAPSULE BY MOUTH DAILY BEFORE Diana Speed, MD Taking Active   naloxone [facility]) nasal spray 4 mg/0.1 mL [id number] No Place into the nose as needed. overdose [provider] Taking Active   olmesartan  (BENICAR) 5 MG tablet [id number] No Take 2 tablets (10 mg total) by mouth 2 (two) times daily. Diana Ripple, DO Taking Active Self  Pitavastatin Calcium 1 MG TABS [id number] No Take 1 tablet (1 mg total) by mouth 2 (two) times a week. [name], Diana Mound, DO Taking Active   SUMAtriptan (IMITREX) 50 MG tablet [id number] No Take 1 tablet by mouth every 2 hours as needed for migraine. May repeat in 2 hours if headache persists or recurs. Diana Dus, MD Taking Active   Turmeric 500 MG CAPS [id number] No Take 2 tablets by mouth daily. [provider] Taking Active   venlafaxine XR (EFFEXOR-XR) 37.5 MG 24 hr capsule [id number] No Take 1 capsule by mouth daily with breakfast. 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 12/26/2021  Encounter for hearing screening with abnormal findings 06/14/2021   Rotator cuff tear, non-traumatic, right 05/21/2021   Lung nodule 09/19/2020   Dry eye syndrome of both eyes 01/15/2020   Memory change 02/07/2019   Tobacco abuse 12/08/2017   Chronic obstructive pulmonary disease (HCC) 03/04/2017   Postmenopausal bone loss 05/29/2014   Migraine aura, persistent, intractable 05/24/2014   CKD (chronic kidney disease), 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   Atrophic vaginitis 08/28/2012   Irritable bowel syndrome (IBS) 06/07/2012   Restless leg syndrome 05/28/2012   Anxiety 05/28/2012   Hypertension, essential, benign 05/28/2012   Hyperlipidemia 05/28/2012   Barrett's esophagus 05/20/2012   Conditions to be addressed/monitored per PCP  order:  Chronic healthcare management needs, CAD, HLD, COPD, CKD, chronic pain, GERD, HTN, RLS, gout, asthma, migraines, IBS  Care Plan : RN Care Manager Plan of Care  Updates made by Danie Chandler, RN since 10/22/2023 12:00 AM     Problem: Health Promotion or Disease Self-Management (General Plan of Care)      Long-Range Goal: Chronic Disease Management   Start Date: 12/10/2022  Expected End Date: 01/22/2024  Priority: High  Note:   .Current Barriers:  Knowledge Deficits related to plan of care for management of CAD, HTN, HLD, COPD, CKD Stage 2, Gout, Asthma, and syncope, chronic pain, RLS, migraines, IBS, vaping  Chronic Disease Management support and education needs related to CAD, HTN, HLD, COPD, CKD Stage 2, Gout, Asthma, and syncope, chronic pain, RLS, migraines, IBS, vaping 10/22/23:  Patient with sinusitis-on meds.  Sciatica continues and is doing home exercises-? CT to be done.  BP remains stable on meds-averages 140/80-has [facility]DS f/u in February.  Vapes daily.   RNCM Clinical Goal(s):  Patient will verbalize understanding of plan for management of CAD, HTN, HLD, COPD, CKD Stage 2, gout, Asthma, and syncope, chronic pain, RLS, migraines, IBS,vaping as evidenced by patient report and taking all medications verbalize basic understanding of  CAD, HTN, HLD, COPD, CKD Stage 2, Gout, Asthma, and syncope, chronic pain, RLS, migraines,IBS,  vaping  disease process and self health management plan as evidenced by patient report and taking all medications take all medications exactly as prescribed and will call provider for medication related questions as evidenced by patient report demonstrate understanding of rationale for each prescribed medication as evidenced by patient report attend all scheduled medical appointments as evidenced by patient report demonstrate Improved adherence to prescribed treatment plan for CAD, HTN, HLD, COPD, CKD Stage 2, Gout, Asthma, and syncope, chronic pain, RLS,  migraines, IBS, vaping  as evidenced by patient report and taking all medications continue to work with RN Care Manager to address care management and care coordination needs related to  CAD, HTN, HLD, COPD, CKD Stage 2, Gout, and Asthma, syncope, chronic pain, RLS, migraines, IBS, vaping as evidenced by adherence to CM Team Scheduled appointments through collaboration with RN Care manager, provider, and care team.   Interventions: Inter-disciplinary care team collaboration (see longitudinal plan of care) Evaluation of current treatment plan related to  self management and patient's adherence to plan as established by provider  Asthma: (Status:New goal.) Long Term Goal Provided education about and advised patient to utilize infection prevention strategies to reduce risk of respiratory infection Discussed the importance of adequate rest and management of fatigue with Asthma Assessed social determinant of health barriers   CAD Interventions: (Status:  New goal.) Long Term Goal Assessed understanding of CAD diagnosis Medications reviewed including medications utilized in CAD treatment plan Reviewed Importance of taking  all medications as prescribed Reviewed Importance of attending all scheduled provider appointments Advised patient to discuss medications with provider Assessed social determinant of health barriers   Chronic Kidney Disease Interventions:  (Status:  New goal.) Long Term Goal Evaluation of current treatment plan related to chronic kidney disease self management and patient's adherence to plan as established by provider      Reviewed medications with patient and discussed importance of compliance    Advised patient, providing education and rationale, to monitor blood pressure daily and record, calling PCP for findings outside established parameters    Reviewed scheduled/upcoming provider appointments including    Discussed plans with patient for ongoing care management follow up  and provided patient with direct contact information for care management team    Assessed social determinant of health barriers    Last practice recorded BP readings:  BP Readings from Last 3 Encounters:     09/01/23 124/74  10/22/23   140/80  01/26/23         129/84  Most recent eGFR/CrCl: No results found for: "EGFR"  No components found for: "CRCL"  COPD Interventions:  (Status:  New goal.) Long Term Goal Provided education about and advised patient to utilize infection prevention strategies to reduce risk of respiratory infection Discussed the importance of adequate rest and management of fatigue with COPD Assessed social determinant of health barriers  Hyperlipidemia Interventions:  (Status:  New goal.) Long Term Goal Medication review performed; medication list updated in electronic medical record.  Assessed social determinant of health barriers   Hypertension Interventions:  (Status:  New goal.) Long Term Goal Last practice recorded BP readings:  BP Readings from Last 3 Encounters:  07/22/23:         138-146/79-83 09/01/23        124/74 04/06/23            129-167/71-82  Most recent eGFR/CrCl: No results found for: "EGFR"  No components found for: "CRCL"  Evaluation of current treatment plan related to hypertension self management and patient's adherence to plan as established by provider Reviewed medications with patient and discussed importance of compliance Discussed plans with patient for ongoing care management follow up and provided patient with direct contact information for care management team Advised patient, providing education and rationale, to monitor blood pressure daily and record, calling PCP for findings outside established parameters Reviewed scheduled/upcoming provider appointments including:  Advised patient to discuss medication  with provider Assessed social determinant of health barriers  Pain Interventions:  (Status:  New goal.) Long Term Goal Pain  assessment performed Medications reviewed Reviewed provider established plan for pain management Discussed importance of adherence to all scheduled medical appointments Counseled on the importance of reporting any/all new or changed pain symptoms or management strategies to pain management provider Advised patient to report to care team affect of pain on daily activities Discussed use of relaxation techniques and/or diversional activities to assist with pain reduction (distraction, imagery, relaxation, massage, acupressure, TENS, heat, and cold application Reviewed with patient prescribed pharmacological and nonpharmacological pain relief strategies Assessed social determinant of health barriers  Patient Goals/Self-Care Activities: Take all medications as prescribed Attend all scheduled provider appointments Call pharmacy for medication refills 3-7 days in advance of running out of medications Perform all self care activities independently  Perform IADL's (shopping, preparing meals, housekeeping, managing finances) independently Call provider office for new concerns or questions  Patient to follow up with provider regarding cough, BP medication, RLS-completed  Follow Up Plan:  The patient has been provided with contact information for the care management team and has been advised to call with any health related questions or concerns.  The care management team will reach out to the patient again over the next 60 business  days.    Long-Range Goal: Establish Plan of Care for Chronic Disease Management Needs   Priority: High  Note:   Timeframe:  Long-Range Goal Priority:  High Start Date:      12/10/22                       Expected End Date:    ongoing                  Follow Up Date 12/04/23   - schedule appointment for flu shot - schedule appointment for vaccines needed due to my age or health - schedule recommended health tests (blood work, mammogram, colonoscopy, pap test) -  schedule and keep appointment for annual check-up    Why is this important?   Screening tests can find diseases early when they are easier to treat.  Your doctor or nurse will talk with you about which tests are important for you.  Getting shots for common diseases like the flu and shingles will help prevent them. 10/22/23:  recently seen by PCP and [facility], has [facility]DS appt in February   Follow Up:  Patient agrees to Care Plan and Follow-up.  Plan: The Managed Medicaid care management team will reach out to the patient again over the next 30 business  days. and The  Patient has been provided with contact information for the Managed Medicaid care management team and has been advised to call with any health related questions or concerns.  Date/time of next scheduled RN care management/care coordination outreach:  12/04/23 at 0900

## 2023-10-22 NOTE — Patient Instructions (Signed)
Hi Ms. Dsouza you for the updates-I hope you feel better very soon!  Ms. Meanor was given information about Medicaid Managed Care team care coordination services as a part of their [facility] Medicaid benefit. [name] verbally consented to engagement with the [facility] Managed Care team.   If you are experiencing a medical emergency, please call 911 or report to your local emergency department or urgent care.   If you have a non-emergency medical problem during routine business hours, please contact your provider's office and ask to speak with a nurse.   For questions related to your Healthy [facility] health plan, please call: [phone] or visit the homepage here: [email or web address]  If you would like to schedule transportation through your Healthy [facility] plan, please call the following number at least 2 days in advance of your appointment: [phone]  For information about your ride after you set it up, call Ride Assist at [phone]. Use this number to activate a Will Call pickup, or if your transportation is late for a scheduled pickup. Use this number, too, if you need to make a change or cancel a previously scheduled reservation.  If you need transportation services right away, call [phone]. The after-hours call center is staffed 24 hours to handle ride assistance and urgent reservation requests (including discharges) 365 days a year. Urgent trips include sick visits, hospital discharge requests and life-sustaining treatment.  Call the [facility] Line at [phone], at any time, 24 hours a day, 7 days a week. If you are in danger or need immediate medical attention call 911.  If you would like help to quit smoking, call 1-800-QUIT-NOW (959-572-6400) OR Espaol: 1-855-Djelo-Ya (4-742-595-6387) o para ms informacin haga clic aqu or Text READY to 564-332 to register via text  Ms. Damer - following are the  goals we discussed in your visit today:   Goals Addressed    Timeframe:  Long-Range Goal Priority:  High Start Date:      12/10/22                       Expected End Date:    ongoing                  Follow Up Date 12/04/23   - schedule appointment for flu shot - schedule appointment for vaccines needed due to my age or health - schedule recommended health tests (blood work, mammogram, colonoscopy, pap test) - schedule and keep appointment for annual check-up    Why is this important?   Screening tests can find diseases early when they are easier to treat.  Your doctor or nurse will talk with you about which tests are important for you.  Getting shots for common diseases like the flu and shingles will help prevent them. 10/22/23:  recently seen by PCP and [facility], has CARDS appt in February  Patient verbalizes understanding of instructions and care plan provided today and agrees to view in MyChart. Active MyChart status and patient understanding of how to access instructions and care plan via MyChart confirmed with patient.     The Managed Medicaid care management team will reach out to the patient again over the next 60 business  days.  The  Patient  has been provided with contact information for the Managed Medicaid care management team and has been advised to call with any health related questions or concerns.   Kathi Der RN, [name] [place]  [facility] [employer]  Management Coordinator - Managed Medicaid High Risk [phone]   Following is a copy of your plan of care:  Care Plan : RN Care Manager Plan of Care  Updates made by Danie Chandler, RN since 10/22/2023 12:00 AM     Problem: Health Promotion or Disease Self-Management (General Plan of Care)      Long-Range Goal: Chronic Disease Management   Start Date: 12/10/2022  Expected End Date: 01/22/2024  Priority: High  Note:   .Current Barriers:  Knowledge Deficits related to plan of care for management of CAD,  HTN, HLD, COPD, CKD Stage 2, Gout, Asthma, and syncope, chronic pain, RLS, migraines, IBS, vaping  Chronic Disease Management support and education needs related to CAD, HTN, HLD, COPD, CKD Stage 2, Gout, Asthma, and syncope, chronic pain, RLS, migraines, IBS, vaping 10/22/23:  Patient with sinusitis-on meds.  Sciatica continues and is doing home exercises-? CT to be done.  BP remains stable on meds-averages 140/80-has CARDS f/u in February.  Vapes daily.   RNCM Clinical Goal(s):  Patient will verbalize understanding of plan for management of CAD, HTN, HLD, COPD, CKD Stage 2, gout, Asthma, and syncope, chronic pain, RLS, migraines, IBS,vaping as evidenced by patient report and taking all medications verbalize basic understanding of  CAD, HTN, HLD, COPD, CKD Stage 2, Gout, Asthma, and syncope, chronic pain, RLS, migraines,IBS,  vaping  disease process and self health management plan as evidenced by patient report and taking all medications take all medications exactly as prescribed and will call provider for medication related questions as evidenced by patient report demonstrate understanding of rationale for each prescribed medication as evidenced by patient report attend all scheduled medical appointments as evidenced by patient report demonstrate Improved adherence to prescribed treatment plan for CAD, HTN, HLD, COPD, CKD Stage 2, Gout, Asthma, and syncope, chronic pain, RLS, migraines, IBS, vaping  as evidenced by patient report and taking all medications continue to work with RN Care Manager to address care management and care coordination needs related to  CAD, HTN, HLD, COPD, CKD Stage 2, Gout, and Asthma, syncope, chronic pain, RLS, migraines, IBS, vaping as evidenced by adherence to CM Team Scheduled appointments through collaboration with RN Care manager, provider, and care team.   Interventions: Inter-disciplinary care team collaboration (see longitudinal plan of care) Evaluation of current  treatment plan related to  self management and patient's adherence to plan as established by provider  Asthma: (Status:New goal.) Long Term Goal Provided education about and advised patient to utilize infection prevention strategies to reduce risk of respiratory infection Discussed the importance of adequate rest and management of fatigue with Asthma Assessed social determinant of health barriers   CAD Interventions: (Status:  New goal.) Long Term Goal Assessed understanding of CAD diagnosis Medications reviewed including medications utilized in CAD treatment plan Reviewed Importance of taking all medications as prescribed Reviewed Importance of attending all scheduled provider appointments Advised patient to discuss medications with provider Assessed social determinant of health barriers   Chronic Kidney Disease Interventions:  (Status:  New goal.) Long Term Goal Evaluation of current treatment plan related to chronic kidney disease self management and patient's adherence to plan as established by provider      Reviewed medications with patient and discussed importance of compliance    Advised patient, providing education and rationale, to monitor blood pressure daily and record, calling PCP for findings outside established parameters    Reviewed scheduled/upcoming provider appointments including    Discussed plans with patient for ongoing care  management follow up and provided patient with direct contact information for care management team    Assessed social determinant of health barriers    Last practice recorded BP readings:  BP Readings from Last 3 Encounters:     09/01/23 124/74  10/22/23   140/80  01/26/23         129/84  Most recent eGFR/CrCl: No results found for: "EGFR"  No components found for: "CRCL"  COPD Interventions:  (Status:  New goal.) Long Term Goal Provided education about and advised patient to utilize infection prevention strategies to reduce risk of  respiratory infection Discussed the importance of adequate rest and management of fatigue with COPD Assessed social determinant of health barriers  Hyperlipidemia Interventions:  (Status:  New goal.) Long Term Goal Medication review performed; medication list updated in electronic medical record.  Assessed social determinant of health barriers   Hypertension Interventions:  (Status:  New goal.) Long Term Goal Last practice recorded BP readings:  BP Readings from Last 3 Encounters:  07/22/23:         138-146/79-83 09/01/23        124/74 04/06/23            129-167/71-82  Most recent eGFR/CrCl: No results found for: "EGFR"  No components found for: "CRCL"  Evaluation of current treatment plan related to hypertension self management and patient's adherence to plan as established by provider Reviewed medications with patient and discussed importance of compliance Discussed plans with patient for ongoing care management follow up and provided patient with direct contact information for care management team Advised patient, providing education and rationale, to monitor blood pressure daily and record, calling PCP for findings outside established parameters Reviewed scheduled/upcoming provider appointments including:  Advised patient to discuss medication  with provider Assessed social determinant of health barriers  Pain Interventions:  (Status:  New goal.) Long Term Goal Pain assessment performed Medications reviewed Reviewed provider established plan for pain management Discussed importance of adherence to all scheduled medical appointments Counseled on the importance of reporting any/all new or changed pain symptoms or management strategies to pain management provider Advised patient to report to care team affect of pain on daily activities Discussed use of relaxation techniques and/or diversional activities to assist with pain reduction (distraction, imagery, relaxation, massage,  acupressure, TENS, heat, and cold application Reviewed with patient prescribed pharmacological and nonpharmacological pain relief strategies Assessed social determinant of health barriers  Patient Goals/Self-Care Activities: Take all medications as prescribed Attend all scheduled provider appointments Call pharmacy for medication refills 3-7 days in advance of running out of medications Perform all self care activities independently  Perform IADL's (shopping, preparing meals, housekeeping, managing finances) independently Call provider office for new concerns or questions  Patient to follow up with provider regarding cough, BP medication, RLS-completed  Follow Up Plan:  The patient has been provided with contact information for the care management team and has been advised to call with any health related questions or concerns.  The care management team will reach out to the patient again over the next 60 business  days.

## 2023-10-26 ENCOUNTER — Other Ambulatory Visit: Payer: Self-pay | Admitting: Cardiology

## 2023-11-03 ENCOUNTER — Other Ambulatory Visit: Payer: Self-pay | Admitting: Cardiology

## 2023-11-03 NOTE — Telephone Encounter (Signed)
Refill refused, did not reflect change in her Olmesartan.

## 2023-11-09 ENCOUNTER — Other Ambulatory Visit: Payer: Self-pay | Admitting: Cardiology

## 2023-11-09 DIAGNOSIS — I2510 Atherosclerotic heart disease of native coronary artery without angina pectoris: Secondary | ICD-10-CM

## 2023-11-09 DIAGNOSIS — E7800 Pure hypercholesterolemia, unspecified: Secondary | ICD-10-CM

## 2023-11-09 DIAGNOSIS — E782 Mixed hyperlipidemia: Secondary | ICD-10-CM

## 2023-11-11 ENCOUNTER — Other Ambulatory Visit: Payer: Self-pay | Admitting: Cardiology

## 2023-11-12 ENCOUNTER — Telehealth: Payer: Self-pay | Admitting: Pharmacy Technician

## 2023-11-12 ENCOUNTER — Other Ambulatory Visit (HOSPITAL_COMMUNITY): Payer: Self-pay

## 2023-11-12 ENCOUNTER — Telehealth: Payer: Self-pay | Admitting: Pharmacist Clinician (PhC)/ Clinical Pharmacy Specialist

## 2023-11-12 NOTE — Telephone Encounter (Signed)
Pharmacy Patient Advocate Encounter  Received notification from [facility] that Prior Authorization for praluent has been APPROVED from 11/12/23 to 11/30/2098   PA #/Case ID/Reference #: [id number]

## 2023-11-12 NOTE — Telephone Encounter (Signed)
Please update PA for Praluent 150 mg

## 2023-11-12 NOTE — Telephone Encounter (Signed)
Pharmacy Patient Advocate Encounter   Received notification from Pt Calls Messages that prior authorization for praluent is required/requested.   Insurance verification completed.   The patient is insured through [facility] .   Per test claim: PA required; PA submitted to above mentioned insurance via CoverMyMeds Key/confirmation #/EOC [id number] Status is pending

## 2023-12-04 ENCOUNTER — Other Ambulatory Visit: Payer: Self-pay | Admitting: Obstetrics and Gynecology

## 2023-12-04 NOTE — Patient Outreach (Signed)
  Medicaid Managed Care   Unsuccessful Attempt Note   12/04/2023 Name: Diana [name] MRN: [id number] DOB: 1956-03-27  Referred by: Stoney Blizzard, DO Reason for referral : High Risk Managed Medicaid (Unsuccessful telephone outreach)  An unsuccessful telephone outreach was attempted today. The patient was referred to the case management team for assistance with care management and care coordination.    Follow Up Plan: The Managed Medicaid care management team will reach out to the patient again over the next 30 business  days. and The  Patient has been provided with contact information for the Managed Medicaid care management team and has been advised to call with any health related questions or concerns.   Veva Angles RN, [name] [place]  [facility] [employer], [employer] - Managed Medicaid High Risk [phone]

## 2023-12-04 NOTE — Patient Instructions (Signed)
 Hi Ms. Dollens, I am sorry to miss you this morning, I hope you are well - as a part of your Medicaid benefit, you are eligible for care management and care coordination services at no cost or copay. I was unable to reach you by phone today but would be happy to help you with your health related needs. Please feel free to call me at [phone].  A member of the Managed Medicaid care management team will reach out to you again over the next 30 business  days.   Veva Angles RN, [name] [place]  [facility] [employer] - Managed Medicaid High Risk [phone]

## 2023-12-11 ENCOUNTER — Other Ambulatory Visit: Payer: Self-pay | Admitting: Obstetrics and Gynecology

## 2023-12-11 NOTE — Patient Instructions (Signed)
 Hi Diana [name] am sorry to miss you again today-I hope you are well- as a part of your Medicaid benefit, you are eligible for care management and care coordination services at no cost or copay. I was unable to reach you by phone today but would be happy to help you with your health related needs. Please feel free to call me at [phone].  A member of the Managed Medicaid care management team will reach out to you again over the next 30 business days.   Veva Angles RN, [name] [place]  [facility] [employer] - Managed Medicaid High Risk [phone]

## 2023-12-11 NOTE — Patient Outreach (Signed)
  Medicaid Managed Care   Unsuccessful Attempt Note   12/11/2023 Name: Diana [name] MRN: [id number] DOB: Sep 09, 1956  Referred by: Stoney Blizzard, DO Reason for referral : High Risk Managed Medicaid (Unsuccessful telephone outreach)  A second unsuccessful telephone outreach was attempted today. The patient was referred to the case management team for assistance with care management and care coordination.    Follow Up Plan: The Managed Medicaid care management team will reach out to the patient again over the next 30 business  days. and The  Patient has been provided with contact information for the Managed Medicaid care management team and has been advised to call with any health related questions or concerns.   Veva Angles RN, [name] [place]  [facility] [employer] - Managed Medicaid High Risk [phone]

## 2023-12-15 ENCOUNTER — Telehealth: Payer: Self-pay | Admitting: Cardiology

## 2023-12-15 MED ORDER — PITAVASTATIN CALCIUM 1 MG PO TABS: 1.0000 mg | ORAL_TABLET | ORAL | 2 refills | Status: DC

## 2023-12-15 NOTE — Telephone Encounter (Signed)
*  STAT* If patient is at the pharmacy, call can be transferred to refill team.   1. Which medications need to be refilled? (please list name of each medication and dose if known)   Pitavastatin  Calcium  1 MG TABS   2. Which pharmacy/location (including street and city if local pharmacy) is medication to be sent to? [facility] - [place], [place] - [address] Phone: [phone]  Fax: [phone]     3. Do they need a 30 day or 90 day supply? 90 She has switched PCP's and the new PCP wants the cardiologist to start filling this medication

## 2023-12-16 ENCOUNTER — Other Ambulatory Visit: Payer: Self-pay | Admitting: Obstetrics and Gynecology

## 2023-12-16 NOTE — Patient Outreach (Signed)
 Medicaid Managed Care   Nurse Care Manager Note  12/16/2023 Name:  Diana [name] MRN:  [id number] DOB:  1956/08/21  Diana [name] is an 68 y.o. year old female who is a primary patient of Everhart, Kirstie, DO.  [facility] Managed Care Coordination team was consulted for assistance with:    Chronic healthcare management needs, CAD, HLD, HTN, chronic pain, asthma, COPD, CKD, GERD, RLS, gout, migraines  Diana [name] was given information about Medicaid Managed Care Coordination team services today. Diana [name] Patient agreed to services and verbal consent obtained.  Engaged with patient by telephone for follow up visit in response to provider referral for case management and/or care coordination services.   Patient is participating in a Managed Medicaid Plan:  Yes  Assessments/Interventions:  Review of past medical history, allergies, medications, health status, including review of consultants reports, laboratory and other test data, was performed as part of comprehensive evaluation and provision of chronic care management services.  SDOH (Social Drivers of Health) assessments and interventions performed: SDOH Interventions    Flowsheet Row Patient Outreach Telephone from 12/16/2023 in [place] POPULATION HEALTH DEPARTMENT Patient Outreach Telephone from 10/22/2023 in [place] POPULATION HEALTH DEPARTMENT Patient Outreach Telephone from 09/21/2023 in [place] POPULATION HEALTH DEPARTMENT Patient Outreach Telephone from 07/22/2023 in [place] POPULATION HEALTH DEPARTMENT Patient Outreach Telephone from 06/05/2023 in [place] POPULATION HEALTH DEPARTMENT Patient Outreach Telephone from 04/06/2023 in  POPULATION HEALTH DEPARTMENT  SDOH Interventions        Food Insecurity Interventions -- -- Intervention Not Indicated -- -- --  Housing Interventions -- Intervention Not Indicated -- -- -- --  Transportation Interventions -- -- -- Intervention Not Indicated -- --  Utilities  Interventions -- -- Intervention Not Indicated -- -- --  Alcohol Usage Interventions -- -- Intervention Not Indicated (Score <7) -- -- --  Financial Strain Interventions Intervention Not Indicated -- -- -- -- Intervention Not Indicated  Physical Activity Interventions Other (Comments)  [unable to perform this type of exercise] -- -- -- -- --  [declined]  Stress Interventions -- -- -- -- Intervention Not Indicated --  Health Literacy Interventions -- -- -- Intervention Not Indicated -- --     Care Plan Allergies  Allergen Reactions   Penicillins Anaphylaxis   Demerol [Meperidine] Nausea And Vomiting   Doxycycline  Hyclate Nausea And Vomiting   Gabapentin Other (See Comments)    Malaise   Sulfa Antibiotics Hives   Atorvastatin      myalgias   Fluvastatin      myalgia   Levaquin  [Levofloxacin ]     Reported some possible facial swelling with administration 05/2018   Lovastatin      myalgia   No Known Allergies     Other reaction(s): Dizziness   Pitavastatin      myalgia   Pravastatin      myalgia   Rosuvastatin      myalgia   Simvastatin      myalgia   Benadryl [Diphenhydramine Hcl] Palpitations and Rash   Buprenorphine Hcl Nausea And Vomiting   Cephalexin Rash   Morphine And Codeine  Nausea And Vomiting   Spiriva  Handihaler [Tiotropium Bromide  Monohydrate] Rash   Medications Reviewed Today     Reviewed by Diana Redo, RN (Registered Nurse) on 12/16/23 at 1011  Med List Status: <None>   Medication Order Taking? Sig Documenting Provider Last Dose Status Informant  albuterol  (VENTOLIN  HFA) 108 (90 Base) MCG/ACT inhaler [id number] No Inhale 2 puffs into the lungs every 4 (four) hours as  needed for wheezing or shortness of breath. [name], [name], DO Taking Active   Alirocumab  (PRALUENT ) 150 MG/ML SOAJ [id number]  INJECT 150 MG INTO THE SKIN EVERY 14 (FOURTEEN) DAYS. [name], Kardie, DO  Active   allopurinol  (ZYLOPRIM ) 100 MG tablet [id number] No Take 1 tablet by mouth daily. Edsel Grace, MD Taking Active   AMITIZA 24 MCG capsule [id number] No Take 24 mcg by mouth daily. [provider] Taking Active   aspirin EC 325 MG tablet [id number] No Take 325 mg by mouth daily. [provider] Taking Active   aspirin-acetaminophen -caffeine  (EXCEDRIN MIGRAINE) 250-250-65 MG tablet [id number] No Take 2 tablets by mouth every 6 (six) hours as needed for headache. [provider] Taking Active   azelastine (ASTELIN) 0.1 % nasal spray [id number] No Place into the nose in the morning and at bedtime. [provider] Taking Active   azithromycin  (ZITHROMAX  Z-PAK) 250 MG tablet [id number] No Take 2 tablets on day 1 and then follow with 1 tablet daily for 4 more days [name], [name], DO Taking Active   clopidogrel (PLAVIX) 75 MG tablet [id number] No Take 75 mg by mouth daily. [provider] Taking Active   co-enzyme Q-10 30 MG capsule [id number] No Take 100 mg by mouth daily. [provider] Taking Active   Cyanocobalamin (B-12) 3000 MCG CAPS [id number] No Take 1 capsule by mouth daily. [provider] Taking Active   diclofenac  sodium (VOLTAREN ) 1 % GEL [id number] No Apply 2 g topically 4 (four) times daily as needed. Ransom Byers, MD Taking Active   esomeprazole  (NEXIUM ) 40 MG capsule [id number] No TAKE 1 CAPSULE BY MOUTH DAILY AT 12 Tammy Fallen, Debbrah Faith, MD Taking Active   famotidine  (PEPCID ) 20 MG tablet [id number] No One after supper Diamond Formica, MD Taking Active   HYDROcodone -acetaminophen  (NORCO/VICODIN) 5-325 MG tablet [id number] No Take 1 tablet by mouth every 8 (eight) hours as needed. [provider] Taking Active   lidocaine  (HM LIDOCAINE  PATCH) 4 % [id number] No Place 1 patch onto the skin daily. Edsel Grace, MD Taking Active   linaclotide  (LINZESS ) 145 MCG CAPS capsule [id number] No TAKE 1 CAPSULE BY MOUTH DAILY BEFORE [name], MD Taking Active   naloxone [facility]) nasal spray 4 mg/0.1 mL  [id number] No Place into the nose as needed. overdose [provider] Taking Active   olmesartan  (BENICAR ) 5 MG tablet [id number]  TAKE ONE TABLET BY MOUTH TWO TIMES DAILY [name], Kardie, DO  Active   Pitavastatin  Calcium  1 MG TABS [id number]  Take 1 tablet (1 mg total) by mouth 2 (two) times a week. [name], Kardie, DO  Active   SUMAtriptan  (IMITREX ) 50 MG tablet [id number] No Take 1 tablet by mouth every 2 hours as needed for migraine. May repeat in 2 hours if headache persists or recurs. Edsel Grace, MD Taking Active   Turmeric 500 MG CAPS [id number] No Take 2 tablets by mouth daily. [provider] Taking Active   venlafaxine  XR (EFFEXOR -XR) 37.5 MG 24 hr capsule [id number] No Take 1 capsule by mouth daily with breakfast. 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 12/26/2021   Encounter for hearing screening with abnormal findings 06/14/2021   Rotator cuff tear, non-traumatic, right 05/21/2021   Lung nodule 09/19/2020   Dry eye syndrome of both eyes 01/15/2020   Memory change 02/07/2019   Tobacco abuse 12/08/2017   Chronic obstructive pulmonary disease (HCC) 03/04/2017   Postmenopausal bone loss 05/29/2014   Migraine aura, persistent, intractable 05/24/2014   CKD (chronic kidney disease), 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   Atrophic vaginitis 08/28/2012   Irritable bowel syndrome (IBS) 06/07/2012   Restless leg syndrome 05/28/2012   Anxiety 05/28/2012   Hypertension, essential, benign 05/28/2012   Hyperlipidemia 05/28/2012   Barrett's  esophagus 05/20/2012   Conditions to be addressed/monitored per PCP order:  Chronic healthcare management needs, CAD, HLD, HTN, chronic pain, asthma, COPD, CKD, GERD, RLS, gout, migraines  Care Plan : RN Care Manager Plan of Care  Updates made by Diana Redo, RN since 12/16/2023 12:00 AM     Problem: Health Promotion or Disease Self-Management (General Plan of Care)      Long-Range Goal: Chronic Disease Management   Start Date: 12/10/2022  Expected End Date: 03/15/2024  Priority: High  Note:   .Current Barriers:  Knowledge Deficits related to plan of care for management of CAD, HTN, HLD, COPD, CKD Stage 2, Gout, Asthma, and syncope, chronic pain, RLS, migraines, IBS, vaping  Chronic Disease Management support and education needs related to CAD, HTN, HLD, COPD, CKD Stage 2, Gout, Asthma, and syncope, chronic pain, RLS, migraines, IBS, vaping 12/16/23:  Ongoing sciatica, awaiting CT approval by insurance company.  Has pain clinic appt 1/19.  Using heat, ice, position changes, meds.  Breathing okay-vapes daily.  CARDS f/u in March-BP averages 140/90 per patient.  No current issues with syncope.    RNCM Clinical Goal(s):  Patient will verbalize understanding of plan for management of CAD, HTN, HLD, COPD, CKD Stage 2, gout, Asthma, and syncope, chronic pain, RLS, migraines, IBS,vaping as evidenced by patient report and taking all medications verbalize basic understanding of  CAD, HTN, HLD, COPD, CKD Stage 2, Gout, Asthma, and syncope, chronic pain, RLS, migraines,IBS,  vaping  disease process and self health management plan as evidenced by patient report and taking all medications take all medications exactly as prescribed and will call provider for medication related questions as evidenced by patient report demonstrate understanding of rationale for each prescribed medication as evidenced by patient report attend all scheduled medical appointments as evidenced by patient report demonstrate  Improved adherence to prescribed treatment plan for CAD, HTN, HLD, COPD, CKD Stage 2, Gout, Asthma, and syncope, chronic pain, RLS, migraines, IBS, vaping  as evidenced by patient report and taking all medications continue to work with RN Care Manager to address care management and care coordination needs related to  CAD, HTN, HLD, COPD, CKD Stage 2, Gout, and Asthma, syncope, chronic pain, RLS, migraines, IBS, vaping as evidenced by adherence to CM Team Scheduled appointments through collaboration with RN Care manager, provider, and care team.   Interventions: Inter-disciplinary care team collaboration (see longitudinal plan of care) Evaluation of current treatment plan related to  self management and patient's adherence to plan as established by provider  Asthma: (Status:New goal.) Long Term Goal Provided education about and advised patient to utilize infection prevention strategies to reduce risk of respiratory infection Discussed the importance of adequate rest and management of fatigue with Asthma Assessed social determinant of health barriers  CAD Interventions: (Status:  New goal.) Long Term Goal Assessed understanding of CAD diagnosis Medications reviewed including medications utilized in CAD treatment plan Reviewed Importance of taking all medications as prescribed Reviewed Importance of attending all scheduled provider appointments Advised patient to discuss medications with provider Assessed social determinant of health barriers   Chronic Kidney Disease Interventions:  (Status:  New goal.) Long Term Goal Evaluation of current treatment plan related to chronic kidney disease self management and patient's adherence to plan as established by provider      Reviewed medications with patient and discussed importance of compliance    Advised patient, providing education and rationale, to monitor blood pressure daily and record, calling PCP for findings outside established parameters     Reviewed scheduled/upcoming provider appointments including    Discussed plans with patient for ongoing care management follow up and provided patient with direct contact information for care management team    Assessed social determinant of health barriers    Last practice recorded BP readings:  BP Readings from Last 3 Encounters:     12/15/22 140/90  10/22/23   140/80  01/26/23         129/84  Most recent eGFR/CrCl: No results found for: "EGFR"  No components found for: "CRCL"  COPD Interventions:  (Status:  New goal.) Long Term Goal Provided education about and advised patient to utilize infection prevention strategies to reduce risk of respiratory infection Discussed the importance of adequate rest and management of fatigue with COPD Assessed social determinant of health barriers  Hyperlipidemia Interventions:  (Status:  New goal.) Long Term Goal Medication review performed; medication list updated in electronic medical record.  Assessed social determinant of health barriers   Hypertension Interventions:  (Status:  New goal.) Long Term Goal Last practice recorded BP readings:  BP Readings from Last 3 Encounters:  07/22/23:         138-146/79-83 09/01/23        124/74 12/15/22        140/90  Most recent eGFR/CrCl: No results found for: "EGFR"  No components found for: "CRCL"  Evaluation of current treatment plan related to hypertension self management and patient's adherence to plan as established by provider Reviewed medications with patient and discussed importance of compliance Discussed plans with patient for ongoing care management follow up and provided patient with direct contact information for care management team Advised patient, providing education and rationale, to monitor blood pressure daily and record, calling PCP for findings outside established parameters Reviewed scheduled/upcoming provider appointments including:  Advised patient to discuss medication  with  provider Assessed social determinant of health barriers  Pain Interventions:  (Status:  New goal.) Long Term Goal Pain assessment performed Medications reviewed Reviewed provider established plan for pain management Discussed importance of adherence to all scheduled medical appointments Counseled on the importance of reporting any/all new or changed pain symptoms or management strategies to pain management provider Advised patient to report to care team affect of pain on daily activities Discussed use of relaxation techniques and/or diversional activities to assist with pain reduction (distraction, imagery, relaxation, massage, acupressure, TENS, heat, and cold application Reviewed with patient prescribed pharmacological and nonpharmacological pain relief strategies Assessed social determinant of health barriers  Patient Goals/Self-Care Activities: Take all medications as prescribed Attend all scheduled provider appointments Call pharmacy for medication refills 3-7 days in advance of running out of medications Perform all self care activities independently  Perform IADL's (shopping, preparing meals, housekeeping, managing finances) independently Call provider  office for new concerns or questions  Patient to follow up with provider regarding cough, BP medication, RLS-completed  Follow Up Plan:  The patient has been provided with contact information for the care management team and has been advised to call with any health related questions or concerns.  The care management team will reach out to the patient again over the next 60 business  days.    Long-Range Goal: Establish Plan of Care for Chronic Disease Management Needs   Priority: High  Note:   Timeframe:  Long-Range Goal Priority:  High Start Date:      12/10/22                       Expected End Date:    ongoing                  Follow Up Date 01/29/24   - schedule appointment for flu shot - schedule appointment for vaccines  needed due to my age or health - schedule recommended health tests (blood work, mammogram, colonoscopy, pap test) - schedule and keep appointment for annual check-up    Why is this important?   Screening tests can find diseases early when they are easier to treat.  Your doctor or nurse will talk with you about which tests are important for you.  Getting shots for common diseases like the flu and shingles will help prevent them. 12/16/23:  CARDS appt in March, pain clinic this month   Follow Up:  Patient agrees to Care Plan and Follow-up.  Plan: The Managed Medicaid care management team will reach out to the patient again over the next 60 business  days. and The  Patient has been provided with contact information for the Managed Medicaid care management team and has been advised to call with any health related questions or concerns.  Date/time of next scheduled RN care management/care coordination outreach:  01/29/24 at 230

## 2023-12-16 NOTE — Patient Instructions (Signed)
 Hi Ms. Peri, great to catch up with you today-stay warm!!  Ms. Hodapp was given information about Medicaid Managed Care team care coordination services as a part of their Healthy [facility] Medicaid benefit. [name] verbally consented to engagement with the [facility] Managed Care team.   If you are experiencing a medical emergency, please call 911 or report to your local emergency department or urgent care.   If you have a non-emergency medical problem during routine business hours, please contact your provider's office and ask to speak with a nurse.   For questions related to your Healthy [facility] health plan, please call: [phone] or visit the homepage here: [email or web address]  If you would like to schedule transportation through your Healthy [facility] plan, please call the following number at least 2 days in advance of your appointment: [phone]  For information about your ride after you set it up, call Ride Assist at [phone]. Use this number to activate a Will Call pickup, or if your transportation is late for a scheduled pickup. Use this number, too, if you need to make a change or cancel a previously scheduled reservation.  If you need transportation services right away, call [phone]. The after-hours call center is staffed 24 hours to handle ride assistance and urgent reservation requests (including discharges) 365 days a year. Urgent trips include sick visits, hospital discharge requests and life-sustaining treatment.  Call the [facility] Line at [phone], at any time, 24 hours a day, 7 days a week. If you are in danger or need immediate medical attention call 911.  If you would like help to quit smoking, call 1-800-QUIT-NOW (6234501714) OR Espaol: 1-855-Djelo-Ya (4-742-595-6387) o para ms informacin haga clic aqu or Text READY to 564-332 to register via text  Ms. Bellizzi - following are the goals we  discussed in your visit today:   Goals Addressed    Timeframe:  Long-Range Goal Priority:  High Start Date:      12/10/22                       Expected End Date:    ongoing                  Follow Up Date 01/29/24   - schedule appointment for flu shot - schedule appointment for vaccines needed due to my age or health - schedule recommended health tests (blood work, mammogram, colonoscopy, pap test) - schedule and keep appointment for annual check-up    Why is this important?   Screening tests can find diseases early when they are easier to treat.  Your doctor or nurse will talk with you about which tests are important for you.  Getting shots for common diseases like the flu and shingles will help prevent them. 12/16/23:  CARDS appt in March, pain clinic this month  Patient verbalizes understanding of instructions and care plan provided today and agrees to view in MyChart. Active MyChart status and patient understanding of how to access instructions and care plan via MyChart confirmed with patient.     The Managed Medicaid care management team will reach out to the patient again over the next 60 business  days.  The  Patient  has been provided with contact information for the Managed Medicaid care management team and has been advised to call with any health related questions or concerns.   Glenn Lange RN, [name], [employer] [facility] RN Care Manager Direct Dial [phone]  [phone] Website: [email or web address]   Following is a copy of your plan of care:  Care Plan : RN Care Manager Plan of Care  Updates made by Lianne Redo, RN since 12/16/2023 12:00 AM     Problem: Health Promotion or Disease Self-Management (General Plan of Care)      Long-Range Goal: Chronic Disease Management   Start Date: 12/10/2022  Expected End Date: 03/15/2024  Priority: High  Note:   .Current Barriers:  Knowledge Deficits related to plan of care for management of CAD,  HTN, HLD, COPD, CKD Stage 2, Gout, Asthma, and syncope, chronic pain, RLS, migraines, IBS, vaping  Chronic Disease Management support and education needs related to CAD, HTN, HLD, COPD, CKD Stage 2, Gout, Asthma, and syncope, chronic pain, RLS, migraines, IBS, vaping 12/16/23:  Ongoing sciatica, awaiting CT approval by insurance company.  Has pain clinic appt 1/19.  Using heat, ice, position changes, meds.  Breathing okay-vapes daily.  CARDS f/u in March-BP averages 140/90 per patient.  No current issues with syncope.    RNCM Clinical Goal(s):  Patient will verbalize understanding of plan for management of CAD, HTN, HLD, COPD, CKD Stage 2, gout, Asthma, and syncope, chronic pain, RLS, migraines, IBS,vaping as evidenced by patient report and taking all medications verbalize basic understanding of  CAD, HTN, HLD, COPD, CKD Stage 2, Gout, Asthma, and syncope, chronic pain, RLS, migraines,IBS,  vaping  disease process and self health management plan as evidenced by patient report and taking all medications take all medications exactly as prescribed and will call provider for medication related questions as evidenced by patient report demonstrate understanding of rationale for each prescribed medication as evidenced by patient report attend all scheduled medical appointments as evidenced by patient report demonstrate Improved adherence to prescribed treatment plan for CAD, HTN, HLD, COPD, CKD Stage 2, Gout, Asthma, and syncope, chronic pain, RLS, migraines, IBS, vaping  as evidenced by patient report and taking all medications continue to work with RN Care Manager to address care management and care coordination needs related to  CAD, HTN, HLD, COPD, CKD Stage 2, Gout, and Asthma, syncope, chronic pain, RLS, migraines, IBS, vaping as evidenced by adherence to CM Team Scheduled appointments through collaboration with RN Care manager, provider, and care team.   Interventions: Inter-disciplinary care team  collaboration (see longitudinal plan of care) Evaluation of current treatment plan related to  self management and patient's adherence to plan as established by provider  Asthma: (Status:New goal.) Long Term Goal Provided education about and advised patient to utilize infection prevention strategies to reduce risk of respiratory infection Discussed the importance of adequate rest and management of fatigue with Asthma Assessed social determinant of health barriers   CAD Interventions: (Status:  New goal.) Long Term Goal Assessed understanding of CAD diagnosis Medications reviewed including medications utilized in CAD treatment plan Reviewed Importance of taking all medications as prescribed Reviewed Importance of attending all scheduled provider appointments Advised patient to discuss medications with provider Assessed social determinant of health barriers   Chronic Kidney Disease Interventions:  (Status:  New goal.) Long Term Goal Evaluation of current treatment plan related to chronic kidney disease self management and patient's adherence to plan as established by provider      Reviewed medications with patient and discussed importance of compliance    Advised patient, providing education and rationale, to monitor blood pressure daily and record, calling PCP for findings outside established parameters    Reviewed scheduled/upcoming provider appointments including    Discussed  plans with patient for ongoing care management follow up and provided patient with direct contact information for care management team    Assessed social determinant of health barriers    Last practice recorded BP readings:  BP Readings from Last 3 Encounters:     12/15/22 140/90  10/22/23   140/80  01/26/23         129/84  Most recent eGFR/CrCl: No results found for: "EGFR"  No components found for: "CRCL"  COPD Interventions:  (Status:  New goal.) Long Term Goal Provided education about and advised patient to  utilize infection prevention strategies to reduce risk of respiratory infection Discussed the importance of adequate rest and management of fatigue with COPD Assessed social determinant of health barriers  Hyperlipidemia Interventions:  (Status:  New goal.) Long Term Goal Medication review performed; medication list updated in electronic medical record.  Assessed social determinant of health barriers   Hypertension Interventions:  (Status:  New goal.) Long Term Goal Last practice recorded BP readings:  BP Readings from Last 3 Encounters:  07/22/23:         138-146/79-83 09/01/23        124/74 12/15/22        140/90  Most recent eGFR/CrCl: No results found for: "EGFR"  No components found for: "CRCL"  Evaluation of current treatment plan related to hypertension self management and patient's adherence to plan as established by provider Reviewed medications with patient and discussed importance of compliance Discussed plans with patient for ongoing care management follow up and provided patient with direct contact information for care management team Advised patient, providing education and rationale, to monitor blood pressure daily and record, calling PCP for findings outside established parameters Reviewed scheduled/upcoming provider appointments including:  Advised patient to discuss medication  with provider Assessed social determinant of health barriers  Pain Interventions:  (Status:  New goal.) Long Term Goal Pain assessment performed Medications reviewed Reviewed provider established plan for pain management Discussed importance of adherence to all scheduled medical appointments Counseled on the importance of reporting any/all new or changed pain symptoms or management strategies to pain management provider Advised patient to report to care team affect of pain on daily activities Discussed use of relaxation techniques and/or diversional activities to assist with pain reduction  (distraction, imagery, relaxation, massage, acupressure, TENS, heat, and cold application Reviewed with patient prescribed pharmacological and nonpharmacological pain relief strategies Assessed social determinant of health barriers  Patient Goals/Self-Care Activities: Take all medications as prescribed Attend all scheduled provider appointments Call pharmacy for medication refills 3-7 days in advance of running out of medications Perform all self care activities independently  Perform IADL's (shopping, preparing meals, housekeeping, managing finances) independently Call provider office for new concerns or questions  Patient to follow up with provider regarding cough, BP medication, RLS-completed  Follow Up Plan:  The patient has been provided with contact information for the care management team and has been advised to call with any health related questions or concerns.  The care management team will reach out to the patient again over the next 60 business  days.

## 2024-01-05 ENCOUNTER — Ambulatory Visit: Payer: Medicare Other | Admitting: Obstetrics and Gynecology

## 2024-01-20 ENCOUNTER — Ambulatory Visit: Payer: Medicaid Other | Admitting: Cardiology

## 2024-01-27 ENCOUNTER — Ambulatory Visit: Payer: Medicare (Managed Care) | Attending: Cardiology | Admitting: Cardiology

## 2024-01-27 ENCOUNTER — Encounter: Payer: Self-pay | Admitting: Cardiology

## 2024-01-27 VITALS — BP 118/82 | HR 82 | Ht 61.0 in | Wt 142.8 lb

## 2024-01-27 DIAGNOSIS — E785 Hyperlipidemia, unspecified: Secondary | ICD-10-CM | POA: Diagnosis not present

## 2024-01-27 DIAGNOSIS — I10 Essential (primary) hypertension: Secondary | ICD-10-CM | POA: Diagnosis not present

## 2024-01-27 DIAGNOSIS — Z713 Dietary counseling and surveillance: Secondary | ICD-10-CM | POA: Diagnosis not present

## 2024-01-27 NOTE — Patient Instructions (Signed)
 Medication Instructions:  Your physician recommends that you continue on your current medications as directed. Please refer to the Current Medication list given to you today.  *If you need a refill on your cardiac medications before your next appointment, please call your pharmacy*   Lab Work: Lipids If you have labs (blood work) drawn today and your tests are completely normal, you will receive your results only by: MyChart Message (if you have MyChart) OR A paper copy in the mail If you have any lab test that is abnormal or we need to change your treatment, we will call you to review the results.   Follow-Up: At [facility], you and your health needs are our priority.  As part of our continuing mission to provide you with exceptional heart care, we have created designated Provider Care Teams.  These Care Teams include your primary Cardiologist (physician) and Advanced Practice Providers (APPs -  Physician Assistants and Nurse Practitioners) who all work together to provide you with the care you need, when you need it.   Your next appointment:   1 year(s)  Provider:   [name], DO     Other Instructions Please see our pharmacy staff for medical weight loss

## 2024-01-28 ENCOUNTER — Encounter: Payer: Self-pay | Admitting: Cardiology

## 2024-01-28 LAB — LIPID PANEL
Chol/HDL Ratio: 2.6 ratio (ref 0.0–4.4)
Cholesterol, Total: 176 mg/dL (ref 100–199)
HDL: 67 mg/dL (ref >39)
LDL Chol Calc (NIH): 87 mg/dL (ref 0–99)
Triglycerides: 127 mg/dL (ref 0–149)
VLDL Cholesterol Cal: 22 mg/dL (ref 5–40)

## 2024-01-29 ENCOUNTER — Other Ambulatory Visit: Payer: Self-pay | Admitting: Obstetrics and Gynecology

## 2024-01-29 NOTE — Patient Instructions (Signed)
 Visit Information  Ms. Diana [name]  - as a part of your Medicaid benefit, you are eligible for care management and care coordination services at no cost or copay. I was unable to reach you by phone today but would be happy to help you with your health related needs. Please feel free to call me at [phone].  A member of the Managed Medicaid care management team will reach out to you again over the next 30 business  days.   Kathi Der RN, [name], [employer] [facility] RN Care Manager Direct Dial [phone]/MWN [phone] Website: [email or web address]

## 2024-01-29 NOTE — Patient Outreach (Signed)
  Medicaid Managed Care   Unsuccessful Attempt Note   01/29/2024 Name: Diana [name] MRN: [id number] DOB: 02/13/1956  Referred by: Para March, DO Reason for referral : High Risk Managed Medicaid (Unsuccessful telephone outreach)  An unsuccessful telephone outreach was attempted today. The patient was referred to the case management team for assistance with care management and care coordination.    Follow Up Plan: The Managed Medicaid care management team will reach out to the patient again over the next 30 business  days. and The  Patient has been provided with contact information for the Managed Medicaid care management team and has been advised to call with any health related questions or concerns.   Kathi Der RN, [name], [employer] [facility] RN Care Manager Direct Dial [phone]/WGN [phone] Website: [email or web address]

## 2024-01-30 NOTE — Progress Notes (Signed)
 Cardiology Office Note:    Date:  01/30/2024   ID:  Diana [name], DOB 1956-02-28, MRN [id number]  PCP:  Para March, DO  Cardiologist:  [name], DO  Electrophysiologist:  None   Referring MD: Para March, DO   " I am doing well"  History of Present Illness:    Diana [name] is a 68 y.o. female with a hx of  Hollenhorst plaque, hyperlipidemia, hypertension, tobacco abuse, carotid artery disease s/p right CEA, history of CVA and mild CAD on coronary CT 10/2022.  Echocardiogram obtained on 01/18/2020 showed EF 55 to 60%, grade 1 DD.  Heart monitor obtained in February 2021 showed second-degree AV block lasting 3 seconds, asymptomatic paroxysmal supraventricular tachycardia, PAC less than 1%, PVC less than 1%.  Beta-blocker was stopped afterward.  She has not required a pacemaker.  She is on Livalo and Zetia for hyperlipidemia.  Most recent echocardiogram obtained at [facility] on 09/08/2022 showed EF 50 to 55%, trivial MR, trivial TR, no regional wall motion abnormality.  Repeat heart monitor obtained in November 2023 shows no evidence of any AV block, occasional paroxysmal supraventricular tachycardia as well as PACs.  Coronary CT obtained on 11/19/2022 showed coronary calcium score 418, which placed the patient at 45 percentile for age and sex matched control, mild nonobstructive CAD less than 50%.  She underwent a right CEA for symptomatic right carotid artery stenosis in November 2023.  Carotid Doppler obtained in April 2024 showed 60 to 79% stenosis in bilateral internal carotid artery.  This is being followed by vascular surgery at [facility].  She is on aspirin and Plavix and along with statin therapy.   Since here visit with me she followed with Azalee Course, PA at that time She as stable from a CV standpoint.   She is here for her routine follow up. She offer no specific complaints today.     Past Medical History:  Diagnosis Date   Acute gout involving toe of left foot  10/12/2019   Anxiety    Asthma    Barrett esophagus    "don't know that I've ever been stretched" (08/17/2013)   Bilateral leg edema 07/16/2020   Choledocholithiasis with acute cholecystitis    Chronic lower back pain    COPD (chronic obstructive pulmonary disease) (HCC)    Craniosynostosis    congenital   DDD (degenerative disc disease)    Family history of anesthesia complication    "PONV; both parents" (08/17/2013)   GERD (gastroesophageal reflux disease)    H/O hiatal hernia    H/O: hysterectomy 09/22/2014   By her report at age 14. Has never been on Norma replacement therapy. 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 Switched to lovastatin b/c its $4.     Hypertension    Hyponatremia 08/28/2020   IBS (irritable bowel syndrome)    Kidney stones 07/22/2017   Migraines    "since age 25; probably twice/month" (08/17/2013)   PONV (postoperative nausea and vomiting)    Postmenopausal atrophic vaginitis 09/22/2014   History of hysterectomy. We'll try adding estradiol cream 3 times a week. I would give this for 8 weeks and she's not having some improvement, I would have her return to clinic. If she is having improvement she can decrease the frequency of her  applications to once or twice a week. If she's not had improvement then I would consider increasing him to 7 days a week. 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   Shingles    Sinus pause 02/10/2020   Sleep apnea-like behavior 03/20/2020   Takes dietary supplements 07/28/2020    Past Surgical History:  Procedure Laterality Date   ABDOMINAL HYSTERECTOMY  1992   Total for chronic pelvic pain     APPENDECTOMY     BACK SURGERY  1990's   L4-L5 fusion @ [facility]   CERVICAL SPINE SURGERY  05/28/2019   fused C4-5-6, done in [place]   CHOLECYSTECTOMY  2007   Performed in     COLONOSCOPY     CRANIECTOMY FOR CRANIOSYNOSTOSIS  19591 1960   "1st time got infected; 2nd time didn't take; fix the 3rd time" (08/17/2013)   POSTERIOR LUMBAR FUSION  1990's   "took piece off my hip" (08/17/2013)   TOTAL HIP ARTHROPLASTY Right 12/31/04   AVN after fall injury; performed by Sharion Dove; DePuy S-ROM total hip (metal on metal)   UPPER GASTROINTESTINAL ENDOSCOPY      Current Medications: Current Meds  Medication Sig   albuterol (VENTOLIN HFA) 108 (90 Base) MCG/ACT inhaler Inhale 2 puffs into the lungs every 4 (four) hours as needed for wheezing or shortness of breath.   Alirocumab (PRALUENT) 150 MG/ML SOAJ INJECT 150 MG INTO THE SKIN EVERY 14 (FOURTEEN) DAYS.   allopurinol (ZYLOPRIM) 100 MG tablet Take 1 tablet by mouth daily.   AMITIZA 24 MCG capsule Take 24 mcg by mouth daily.   aspirin EC 325 MG tablet Take 325 mg by mouth daily.   aspirin-acetaminophen-caffeine (EXCEDRIN MIGRAINE) 250-250-65 MG tablet Take 2 tablets by mouth every 6 (six) hours as needed for headache.   azelastine (ASTELIN) 0.1 % nasal spray Place into the nose in the morning and at bedtime.   co-enzyme Q-10 30 MG capsule Take 100 mg by mouth daily.   Cyanocobalamin (B-12) 3000 MCG CAPS Take 1 capsule by mouth daily.   diclofenac sodium (VOLTAREN) 1 % GEL Apply 2 g topically 4 (four) times daily as needed.   esomeprazole (NEXIUM) 40 MG capsule TAKE 1 CAPSULE BY MOUTH DAILY AT 12 NOON   famotidine (PEPCID) 20 MG tablet One after supper   HYDROcodone-acetaminophen (NORCO/VICODIN) 5-325 MG tablet Take 1 tablet by mouth every 8 (eight) hours as needed.   lidocaine (HM LIDOCAINE PATCH) 4 % Place 1 patch onto the skin daily.   linaclotide (LINZESS) 145 MCG CAPS capsule TAKE 1 CAPSULE BY MOUTH DAILY BEFORE  BREAKFAST   naloxone (NARCAN) nasal spray 4 mg/0.1 mL Place into the nose as needed. overdose   olmesartan (BENICAR) 5 MG tablet TAKE ONE TABLET BY MOUTH TWO TIMES DAILY   Pitavastatin Calcium 1 MG TABS Take 1 tablet (1 mg total) by mouth 2 (two) times a week.   SUMAtriptan (IMITREX) 50 MG tablet Take 1 tablet by mouth every 2 hours as needed for migraine. May repeat in 2 hours if headache persists or recurs.   Turmeric 500 MG CAPS Take 2 tablets by mouth daily.   venlafaxine XR (EFFEXOR-XR) 37.5 MG 24 hr capsule Take 1 capsule by mouth daily with breakfast.     Allergies:   Penicillins, Demerol [meperidine], Doxycycline hyclate,  Gabapentin, Sulfa antibiotics, Atorvastatin, Fluvastatin, Levaquin [levofloxacin], Lovastatin, No known allergies, Pitavastatin, Pravastatin, Rosuvastatin, Simvastatin, Benadryl [diphenhydramine hcl], Buprenorphine hcl, Cephalexin, Morphine and codeine, and Spiriva handihaler [tiotropium bromide monohydrate]   Social History   Socioeconomic History   Marital status: Divorced    Spouse name: Not on file   Number of children: 2   Years of education: Not on file   Highest education level: Not on file  Occupational History   Occupation: retired  Tobacco Use   Smoking status: Former    Current packs/day: 0.00    Average packs/day: 0.5 packs/day for 32.0 years (16.0 ttl pk-yrs)    Types: Cigarettes    Start date: 05/30/1990    Quit date: 05/30/2022    Years since quitting: 1.6    Passive exposure: Past   Smokeless tobacco: Never   Tobacco comments:    on chanix  Vaping Use   Vaping status: Every Day  Substance and Sexual Activity   Alcohol use: Yes    Alcohol/week: 0.0 standard drinks of alcohol    Comment: occasionally   Drug use: No   Sexual activity: Not Currently  Other Topics Concern   Not on file  Social History Narrative   Not on file   Social Drivers of Health   Financial Resource Strain: Low Risk  (12/16/2023)   Overall Financial Resource  Strain (CARDIA)    Difficulty of Paying Living Expenses: Not hard at all  Food Insecurity: Low Risk  (10/20/2023)   Received from [facility]   Hunger Vital Sign    Worried About Running Out of Food in the Last Year: Never true    Ran Out of Food in the Last Year: Never true  Transportation Needs: No Transportation Needs (10/20/2023)   Received from [facility]    In the past 12 months, has lack of reliable transportation kept you from medical appointments, meetings, work or from getting things needed for daily living? : No  Physical Activity: Inactive (12/16/2023)   Exercise Vital Sign    Days of Exercise per Week: 0 days    Minutes of Exercise per Session: 0 min  Stress: No Stress Concern Present (06/05/2023)   [employer] of Occupational Health - Occupational Stress Questionnaire    Feeling of Stress : Only a little  Social Connections: Moderately Isolated (06/05/2023)   Social Connection and Isolation Panel [NHANES]    Frequency of Communication with Friends and Family: More than three times a week    Frequency of Social Gatherings with Friends and Family: More than three times a week    Attends Religious Services: 1 to 4 times per year    Active Member of [employer] or Organizations: No    Attends Engineer, structural: Never    Marital Status: Divorced     Family History: The patient's family history includes Breast cancer in her maternal grandmother; Colon cancer in her paternal grandfather; Hypertension in her father and mother; Skin cancer in her father. There is no history of Rectal cancer, Stomach cancer, or Esophageal cancer.  ROS:   Review of Systems  Constitution: Negative for decreased appetite, fever and weight gain.  HENT: Negative for congestion, ear discharge, hoarse voice and sore throat.   Eyes: Negative for discharge, redness, vision loss in right eye and visual halos.  Cardiovascular: Negative for chest pain, dyspnea on exertion, leg  swelling, orthopnea and palpitations.  Respiratory: Negative for cough, hemoptysis, shortness of breath and snoring.  Endocrine: Negative for heat intolerance and polyphagia.  Hematologic/Lymphatic: Negative for bleeding problem. Does not bruise/bleed easily.  Skin: Negative for flushing, nail changes, rash and suspicious lesions.  Musculoskeletal: Negative for arthritis, joint pain, muscle cramps, myalgias, neck pain and stiffness.  Gastrointestinal: Negative for abdominal pain, bowel incontinence, diarrhea and excessive appetite.  Genitourinary: Negative for decreased libido, genital sores and incomplete emptying.  Neurological: Negative for brief paralysis, focal weakness, headaches and loss of balance.  Psychiatric/Behavioral: Negative for altered mental status, depression and suicidal ideas.  Allergic/Immunologic: Negative for HIV exposure and persistent infections.    EKGs/Labs/Other Studies Reviewed:    The following studies were reviewed today:   EKG:  The ekg ordered today demonstrates sinus rhythm  Zio monitor 11/29/202  Addendum by [name], DO on Wed Oct 29, 2022  8:47 AM   Patch Wear Time:  14 days and 0 hours (2023-10-28T15:18:41-0400 to 2023-11-11T14:18:41-0500)   Patient had a min HR of 62 bpm, max HR of 169 bpm, and avg HR of 90 bpm. Predominant underlying rhythm was Sinus Rhythm. QRS morphology changes were present throughout recording.    28 Supraventricular Tachycardia runs occurred, the run with the fastest interval lasting 7 beats with a max rate of 169 bpm, the longest lasting 14 beats with an avg rate of 109 bpm. Supraventricular Tachycardia was detected within +/- 45 seconds of symptomatic patient event(s). Isolated SVEs were rare (<1.0%), SVE Couplets  were rare (<1.0%), and SVE Triplets were rare (<1.0%). Isolated VEs were rare (<1.0%), and no VE Couplets or VE Triplets were present.   Symptoms associated with paroxysmal supraventricular tachycardia,  rare premature atrial complex.   Conclusion: This study is remarkable for the following:                       1.  Symptomatic paroxysmal supraventricular tachycardia.                       2.  Symptomatic rare premature atrial complex   Compared to prior monitor done.  No evidence of AV blocks. CCTA 10/2022 TECHNIQUE: The patient was scanned on a [employer].   FINDINGS: A 100 kV prospective scan was triggered in the descending thoracic aorta at 111 HU's. Axial non-contrast 3 mm slices were carried out through the heart. The data set was analyzed on a dedicated work station and scored using the Agatson method. Gantry rotation speed was 250 msecs and collimation was .6 mm. No beta blockade and 0.8 mg of sl NTG was given. The 3D data set was reconstructed in 5% intervals of the 67-82 % of the R-R cycle. Diastolic phases were analyzed on a dedicated work station using MPR, MIP and VRT modes. The patient received 80 cc of contrast.   Aorta: Normal size. Moderate aortic root calcifications. No dissection.   Aortic Valve:  Trileaflet.  No calcifications.   Coronary Arteries:  Normal coronary origin.  Right dominance.   RCA is a large dominant artery that gives rise to PDA and PLA. There is a mild focal calcification in the mid RCA.   Left main is a large artery that gives rise to LAD and LCX arteries.   LAD is a large vessel. Mild focal calcification in the proximal and mid LAD. The distal LAD with no plaques.   LCX is a non-dominant artery that gives rise to one large OM1 branch. There is no plaque.   Coronary Calcium Score:  Left main: 0   Left anterior descending artery: 246   Left circumflex artery: 0   Right coronary artery: 47.2   Total: 418   Percentile: 45   Other findings:   Normal pulmonary vein drainage into the left atrium.   Normal left atrial appendage without a thrombus.   Normal size of the pulmonary artery.   IMPRESSION: 1.  Coronary calcium score of 418. This was 45 percentile for age and sex matched control.   2. Normal coronary origin with right dominance.   3. CAD-RADS 2. Mild non-obstructive CAD (25-49%). Consider non-atherosclerotic causes of chest pain. Consider preventive therapy and risk factor modification.   The noncardiac portion of this study will be interpreted in separate report by the radiologist.   Electronically Signed: By: [name] D.O. On: 11/19/2022 14:41  Stress echo SUMMARY  Normal left ventricular function at rest.  The estimated LV ejection fraction is 60-65% .  There were no segmental wall motion abnormalities at rest  Normal left ventricular function and global wall motion with stress.  There were no segmental wall motion abnormalities with dobutamine.  Negative dobutamine echocardiography for inducible ischemia at target  heart rate.   TTE 09/20/2022 PROCEDURE  A two-dimensional transthoracic echocardiogram with color flow and Doppler was  performed. Image Quality: Technically adequate. Injection of agitated saline  contrast performed to evaluate for possible shunt.  -  SUMMARY  Normal echo doppler study  agitated saline study negative for shunt.  Injection of agitated saline contrast performed to evaluate for possible shunt   -  FINDINGS:  LEFT VENTRICLE  The left ventricular size is normal. There is normal left ventricular wall  thickness. LV ejection fraction = 55-60%. The left ventricular wall motion is  normal.   -  RIGHT VENTRICLE  The right ventricle is normal in size and function.   LEFT ATRIUM  The left atrial size is normal.   RIGHT ATRIUM  Right atrial size is normal. Injection of agitated saline showed no right-to-  left shunt.  -  AORTIC VALVE  There is trivial aortic valve thickening. There is no aortic stenosis. There  is no aortic regurgitation.  -  MITRAL VALVE  There is trivial mitral valve thickening. There is trace mitral  regurgitation.   -  TRICUSPID VALVE  The tricuspid valve is normal in structure and function. No tricuspid  regurgitation.  -  PULMONIC VALVE  Structurally normal pulmonic valve. There is no pulmonic valvular  regurgitation. There is no pulmonic valvular stenosis.  -  ARTERIES  The aortic sinus is normal size.  -  VENOUS  IVC size was normal.  -  EFFUSION  There is no pericardial effusion. There is no pleural effusion. No cardiac  tamponade.   Chest CTA 09/22/2022 IMPRESSION:  1. No acute intracranial abnormality.  2. Approximately 75% stenosis of the right carotid bifurcation.  3. Approximately 55% stenosis of the distal left common carotid  artery.  4. Increased tortuosity of the V1 segments of the bilateral  vertebral arteries with associated approximately 40% stenosis of the  V1 segment of the right vertebral artery.  5. Intracranial atherosclerotic disease without hemodynamically  significant stenosis.  6. Aortic atherosclerosis.   Aortic Atherosclerosis (ICD10-I70.0).    Electronically Signed    By: Baldemar Lenis M.D.    On: 09/22/2022 11:06  CT chest lung cancer screening COMPARISON:  09/17/2020   FINDINGS: Cardiovascular: Heart size appears within normal limits. No pericardial effusion. Aortic atherosclerosis.  Coronary artery calcifications.   Mediastinum/Nodes: No enlarged mediastinal, hilar, or axillary lymph nodes. Thyroid gland, trachea, and esophagus demonstrate no significant findings.   Lungs/Pleura: Mild centrilobular emphysema with diffuse bronchial wall thickening. No pleural effusion, airspace consolidation or atelectasis. Small calcified and noncalcified lung nodules are noted. The largest noncalcified nodule is in the subpleural right upper lobe with a mean derived diameter of 2.9 mm. No suspicious lung nodules identified at this time.   Upper Abdomen: No acute findings within the imaged portions of the upper abdomen.    Musculoskeletal: Mild thoracic spondylosis. No acute or suspicious osseous findings.   IMPRESSION: 1. Lung-RADS 2, benign appearance or behavior. Continue annual screening with low-dose chest CT without contrast in 12 months. 2. Coronary artery calcifications. 3. Aortic Atherosclerosis (ICD10-I70.0) and Emphysema (ICD10-J43.9).     Electronically Signed   By: Signa Kell M.D.   On: 10/02/2021 08:43     January 18, 2020 IMPRESSIONS   1. Left ventricular ejection fraction, by estimation, is 55 to 60%. The  left ventricle has normal function. The left ventricle has no regional  wall motion abnormalities. Left ventricular diastolic parameters are  consistent with Grade I diastolic  dysfunction (impaired relaxation).   2. Right ventricular systolic function is normal. The right ventricular  size is normal.   3. The mitral valve is normal in structure and function. No evidence of  mitral valve regurgitation. No evidence of mitral stenosis.   4. The aortic valve is normal in structure and function. Aortic valve  regurgitation is not visualized. No aortic stenosis is present.   5. There is no obvious atheromatous plaque identified in the visible  portions of the ascending aorta or aortic arch.   6. The inferior vena cava is normal in size with greater than 50%  respiratory variability, suggesting right atrial pressure of 3 mmHg.   Comparison(s): No prior Echocardiogram.   FINDINGS   Left Ventricle: Left ventricular ejection fraction, by estimation, is 55  to 60%. The left ventricle has normal function. The left ventricle has no  regional wall motion abnormalities. The left ventricular internal cavity  size was normal in size. There is   no left ventricular hypertrophy. Left ventricular diastolic parameters  are consistent with Grade I diastolic dysfunction (impaired relaxation).  Normal left ventricular filling pressure.   Right Ventricle: The right ventricular size is  normal. No increase in  right ventricular wall thickness. Right ventricular systolic function is  normal.   Left [facility]: Left atrial size was normal in size.   Right [facility]: Right atrial size was normal in size.   Pericardium: There is no evidence of pericardial effusion.   Mitral Valve: The mitral valve is normal in structure and function. Normal  mobility of the mitral valve leaflets. No evidence of mitral valve  regurgitation. No evidence of mitral valve stenosis.   Tricuspid Valve: The tricuspid valve is normal in structure. Tricuspid  valve regurgitation is not demonstrated. No evidence of tricuspid  stenosis.   Aortic Valve: The aortic valve is normal in structure and function. Aortic  valve regurgitation is not visualized. No aortic stenosis is present.   Pulmonic Valve: The pulmonic valve was normal in structure. Pulmonic valve  regurgitation is not visualized. No evidence of pulmonic stenosis.   Aorta: There is no obvious atheromatous plaque identified in the visible  portions of the ascending aorta or aortic arch. The aortic root is normal  in size and structure and the aortic root, ascending aorta  and aortic arch  are all structurally normal,  with no evidence of dilitation or obstruction.   Venous: The inferior vena cava is normal in size with greater than 50%  respiratory variability, suggesting right atrial pressure of 3 mmHg.   IAS/Shunts: No atrial level shunt detected by color flow Doppler.       Recent Labs: 02/19/2023: Creatinine, Ser 0.90  Recent Lipid Panel    Component Value Date/Time   CHOL 176 01/27/2024 1203   TRIG 127 01/27/2024 1203   HDL 67 01/27/2024 1203   CHOLHDL 2.6 01/27/2024 1203   CHOLHDL 5.7 04/20/2015 1138   VLDL 34 04/20/2015 1138   LDLCALC 87 01/27/2024 1203   LDLDIRECT 246 (H) 10/31/2015 1444    Physical Exam:    VS:  BP 118/82 (BP Location: Right Arm, Patient Position: Sitting, Cuff Size: Normal)   Pulse 82   Ht 5\' 1"   (1.549 m)   Wt 142 lb 12.8 oz (64.8 kg)   SpO2 98%   BMI 26.98 kg/m     Wt Readings from Last 3 Encounters:  01/27/24 142 lb 12.8 oz (64.8 kg)  09/01/23 150 lb (68 kg)  07/15/23 152 lb 9.6 oz (69.2 kg)     GEN: Well nourished, well developed in no acute distress HEENT: Normal NECK: No JVD; No carotid bruits LYMPHATICS: No lymphadenopathy CARDIAC: S1S2 noted,RRR, no murmurs, rubs, gallops RESPIRATORY:  Clear to auscultation without rales, wheezing or rhonchi  ABDOMEN: Soft, non-tender, non-distended, +bowel sounds, no guarding. EXTREMITIES: No edema, No cyanosis, no clubbing MUSCULOSKELETAL:  No deformity  SKIN: Warm and dry NEUROLOGIC:  Alert and oriented x 3, non-focal PSYCHIATRIC:  Normal affect, good insight  ASSESSMENT:    1. Hypertension, essential, benign   2. Hyperlipidemia LDL goal <70   3. Weight loss counseling, encounter for    PLAN:     CAD - no anginal continue current medication regimen.   Hyperlipidemia - Continue lipid lowering agent. Will get lipid panel. Interested in medical weight management - will refer to PharmD team History of second-degree heart block: Resolved after stopping beta-blocker. Repeat heart monitor in November 2023 was negative for acute process. She did have syncope near the end of 2023 and early 2024, this has not recurred. She underwent right CEA for carotid artery disease.     The patient is in agreement with the above plan. The patient left the office in stable condition.  The patient will follow up in 6 months  or sooner if needed.   Medication Adjustments/Labs and Tests Ordered: Current medicines are reviewed at length with the patient today.  Concerns regarding medicines are outlined above.  Orders Placed This Encounter  Procedures   Lipid panel   AMB Referral to [facility] Pharm-D   EKG 12-Lead    No orders of the defined types were placed in this encounter.    Patient Instructions  Medication Instructions:  Your  physician recommends that you continue on your current medications as directed. Please refer to the Current Medication list given to you today.  *If you need a refill on your cardiac medications before your next appointment, please call your pharmacy*   Lab Work: Lipids If you have labs (blood work) drawn today and your tests are completely normal, you will receive your results only by: MyChart Message (if you have MyChart) OR A paper copy in the mail If you have any lab test that is abnormal or we need to change your treatment, we will call you  to review the results.   Follow-Up: At [facility], you and your health needs are our priority.  As part of our continuing mission to provide you with exceptional heart care, we have created designated Provider Care Teams.  These Care Teams include your primary Cardiologist (physician) and Advanced Practice Providers (APPs -  Physician Assistants and Nurse Practitioners) who all work together to provide you with the care you need, when you need it.   Your next appointment:   1 year(s)  Provider:   [name], DO     Other Instructions Please see our pharmacy staff for medical weight loss        Adopting a Healthy Lifestyle.  Know what a healthy weight is for you (roughly BMI <25) and aim to maintain this   Aim for 7+ servings of fruits and vegetables daily   65-80+ fluid ounces of water or unsweet tea for healthy kidneys   Limit to max 1 drink of alcohol per day; avoid smoking/tobacco   Limit animal fats in diet for cholesterol and heart health - choose grass fed whenever available   Avoid highly processed foods, and foods high in saturated/trans fats   Aim for low stress - take time to unwind and care for your mental health   Aim for 150 min of moderate intensity exercise weekly for heart health, and weights twice weekly for bone health   Aim for 7-9 hours of sleep daily   When it comes to diets, agreement about the  perfect plan isnt easy to find, even among the experts. Experts at the [facility] [employer] developed an idea known as the Healthy Eating Plate. Just imagine a plate divided into logical, healthy portions.   The emphasis is on diet quality:   Load up on vegetables and fruits - one-half of your plate: Aim for color and variety, and remember that potatoes dont count.   Go for whole grains - one-quarter of your plate: Whole wheat, barley, wheat berries, quinoa, oats, brown rice, and foods made with them. If you want pasta, go with whole wheat pasta.   Protein power - one-quarter of your plate: Fish, chicken, beans, and nuts are all healthy, versatile protein sources. Limit red meat.   The diet, however, does go beyond the plate, offering a few other suggestions.   Use healthy plant oils, such as olive, canola, soy, corn, sunflower and peanut. Check the labels, and avoid partially hydrogenated oil, which have unhealthy trans fats.   If youre thirsty, drink water. Coffee and tea are good in moderation, but skip sugary drinks and limit milk and dairy products to one or two daily servings.   The type of carbohydrate in the diet is more important than the amount. Some sources of carbohydrates, such as vegetables, fruits, whole grains, and beans-are healthier than others.   Finally, stay active  Signed, [name], DO  01/30/2024 11:49 AM    [facility]

## 2024-02-09 ENCOUNTER — Telehealth: Payer: Self-pay | Admitting: Pharmacist

## 2024-02-09 ENCOUNTER — Telehealth: Payer: Self-pay | Admitting: Pharmacy Technician

## 2024-02-09 ENCOUNTER — Other Ambulatory Visit (HOSPITAL_COMMUNITY): Payer: Self-pay

## 2024-02-09 ENCOUNTER — Other Ambulatory Visit: Payer: Self-pay | Admitting: Obstetrics and Gynecology

## 2024-02-09 NOTE — Telephone Encounter (Signed)
 Per test claim for PRALUENT: Refill too soon. PA is not needed at this time. Medication was filled 02/05/24. Next eligible fill date is 04/07/24.

## 2024-02-09 NOTE — Patient Outreach (Signed)
  Medicaid Managed Care   Unsuccessful Attempt Note   02/09/2024 Name: Diana [name] MRN: [id number] DOB: 1956/05/20  Referred by: Para March, DO Reason for referral : High Risk Managed Medicaid (Unsuccessful telephone outreach)  A second unsuccessful telephone outreach was attempted today. The patient was referred to the case management team for assistance with care management and care coordination.    Follow Up Plan: The Managed Medicaid care management team will reach out to the patient again over the next 30 business  days. and The  Patient has been provided with contact information for the Managed Medicaid care management team and has been advised to call with any health related questions or concerns.   Kathi Der RNC, [name], [employer] [facility] Health RN Care Manager Direct Dial [phone] Website: [email or web address]

## 2024-02-09 NOTE — Telephone Encounter (Signed)
 Refill too soon. PA is not needed at this time. Medication was filled 02/05/24. Next eligible fill date is 04/07/24.    I called her pharmacy and they confirmed this was filled and sold

## 2024-02-09 NOTE — Patient Instructions (Signed)
 Visit Information  Ms. Diana [name]  - as a part of your Medicaid benefit, you are eligible for care management and care coordination services at no cost or copay. I was unable to reach you by phone today but would be happy to help you with your health related needs. Please feel free to call me at [phone].  A member of the Managed Medicaid care management team will reach out to you again over the next 30 business  days.   Kathi Der RNC, [name], [employer] [facility] Health RN Care Manager Direct Dial [phone] Website: [email or web address]

## 2024-02-19 ENCOUNTER — Other Ambulatory Visit: Payer: Self-pay | Admitting: Obstetrics and Gynecology

## 2024-02-19 NOTE — Patient Instructions (Signed)
 Visit Information  Ms. Diana [name]  - as a part of your Medicaid benefit, you are eligible for care management and care coordination services at no cost or copay. I was unable to reach you by phone today but would be happy to help you with your health related needs. Please feel free to call me at [phone].  Kathi Der RNC, [name], [employer] [facility] Health RN Care Manager Direct Dial [phone] Website: [email or web address]

## 2024-02-19 NOTE — Patient Outreach (Signed)
  Medicaid Managed Care   Unsuccessful Attempt Note   02/19/2024 Name: Diana [name] MRN: [id number] DOB: Aug 26, 1956  Referred by: Para March, DO Reason for referral : High Risk Managed Medicaid (Unsuccessful telephone outreach)  Third unsuccessful telephone outreach was attempted today. The patient was referred to the case management team for assistance with care management and care coordination. The patient's primary care provider has been notified of our unsuccessful attempts to make or maintain contact with the patient. The care management team is pleased to engage with this patient at any time in the future should he/she be interested in assistance from the care management team.    Follow Up Plan: The  Patient has been provided with contact information for the Managed Medicaid care management team and has been advised to call with any health related questions or concerns. and The Managed Medicaid care management team is available to follow up with the patient after provider conversation with the patient regarding recommendation for care management engagement and subsequent re-referral to the care management team.    Kathi Der [facility], [name], CM [facility] Health RN Care Manager Direct Dial [phone] Website: [email or web address]

## 2024-02-24 ENCOUNTER — Ambulatory Visit: Payer: Self-pay | Admitting: Cardiology

## 2024-03-01 ENCOUNTER — Other Ambulatory Visit (HOSPITAL_COMMUNITY): Payer: Self-pay

## 2024-03-01 ENCOUNTER — Telehealth: Payer: Self-pay | Admitting: Pharmacist

## 2024-03-01 ENCOUNTER — Ambulatory Visit: Payer: Medicare (Managed Care) | Attending: Cardiology | Admitting: Pharmacist

## 2024-03-01 ENCOUNTER — Encounter: Payer: Self-pay | Admitting: Pharmacist

## 2024-03-01 ENCOUNTER — Telehealth: Payer: Self-pay | Admitting: Pharmacy Technician

## 2024-03-01 VITALS — Ht 61.0 in | Wt 150.0 lb

## 2024-03-01 DIAGNOSIS — Z8673 Personal history of transient ischemic attack (TIA), and cerebral infarction without residual deficits: Secondary | ICD-10-CM | POA: Diagnosis not present

## 2024-03-01 DIAGNOSIS — E669 Obesity, unspecified: Secondary | ICD-10-CM

## 2024-03-01 NOTE — Telephone Encounter (Signed)
 Please complete PA for [facility]. Patient has history of CVA

## 2024-03-01 NOTE — Progress Notes (Signed)
 Patient ID: Diana [name]                 DOB: [birth year]                    MRN: [id number]     HPI: Diana [name] is a 68 y.o. female patient referred to pharmacy clinic by Dr Diana [name] to initiate GLP1-RA therapy. PMH is significant for CVA, HTN, COPD, CKD, HLD , and obesity. Most recent BMI 28.36.  Patient presents today to discuss weight loss therapy. Had stroke in late 2023 and has been working on improving her health since then. Uses stairs at townhouse and walks her dog three times daily.  Eats vegetables, proteins and nuts. Reports her biggest weaknesses are rice and pasta but she only has them a couple times a month.  Patient has no history or family history of MEN2 or medullary thyroid cancer.  Labs: No results found for: "HGBA1C"  Wt Readings from Last 1 Encounters:  01/27/24 142 lb 12.8 oz (64.8 kg)    BP Readings from Last 1 Encounters:  01/27/24 118/82   Pulse Readings from Last 1 Encounters:  01/27/24 82       Component Value Date/Time   CHOL 176 01/27/2024 1203   TRIG 127 01/27/2024 1203   HDL 67 01/27/2024 1203   CHOLHDL 2.6 01/27/2024 1203   CHOLHDL 5.7 04/20/2015 1138   VLDL 34 04/20/2015 1138   LDLCALC 87 01/27/2024 1203   LDLDIRECT 246 (H) 10/31/2015 1444    Past Medical History:  Diagnosis Date   Acute gout involving toe of left foot 10/12/2019   Anxiety    Asthma    Barrett esophagus    "don't know that I've ever been stretched" (08/17/2013)   Bilateral leg edema 07/16/2020   Choledocholithiasis with acute cholecystitis    Chronic lower back pain    COPD (chronic obstructive pulmonary disease) (HCC)    Craniosynostosis    congenital   DDD (degenerative disc disease)    Family history of anesthesia complication    "PONV; both parents" (08/17/2013)   GERD (gastroesophageal reflux disease)    H/O hiatal hernia    H/O: hysterectomy 09/22/2014   By her report at age 36. Has never been on Norma replacement therapy. 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 Switched to lovastatin b/c its $4.     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 We'll try adding estradiol cream 3 times a week. I would give this for 8 weeks and she's not having some improvement, I would have her return to clinic. If she is having improvement she can decrease the frequency of her applications to once or twice a week. If she's not had improvement then I would consider increasing him to 7 days a week. 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 1 each 0   Alirocumab (PRALUENT) 150 MG/ML SOAJ INJECT 150 MG INTO THE SKIN EVERY 14 (FOURTEEN) DAYS. 6 mL 3   allopurinol (ZYLOPRIM) 100 MG tablet Take 1 tablet by mouth daily. 90 tablet 2   AMITIZA 24 MCG capsule Take 24 mcg by mouth daily.     aspirin EC 325 MG tablet Take 325 mg by mouth daily.     aspirin-acetaminophen-caffeine (EXCEDRIN MIGRAINE) 250-250-65 MG tablet Take 2 tablets by mouth every 6 (six) hours as needed for headache.     azelastine (ASTELIN) 0.1 % nasal spray Place into the  nose in the morning and at bedtime.     azithromycin (ZITHROMAX Z-PAK) 250 MG tablet Take 2 tablets on day 1 and then follow with 1 tablet daily for 4 more days 6 each 0   clopidogrel (PLAVIX) 75 MG tablet Take 75 mg by mouth daily.     co-enzyme Q-10 30 MG capsule Take 100 mg by mouth daily.     Cyanocobalamin (B-12) 3000 MCG CAPS Take 1 capsule by mouth daily.     diclofenac sodium (VOLTAREN) 1 % GEL Apply 2 g topically 4 (four) times daily as needed. 100 g 5   esomeprazole (NEXIUM) 40 MG capsule TAKE 1 CAPSULE BY MOUTH DAILY AT 12 NOON 90 capsule 1   famotidine (PEPCID) 20 MG tablet One after supper 30 tablet 11   HYDROcodone-acetaminophen (NORCO/VICODIN) 5-325 MG tablet Take 1 tablet by mouth every 8 (eight) hours as needed.     lidocaine (HM LIDOCAINE PATCH) 4 % Place 1 patch onto the skin daily. 30 patch 2   linaclotide (LINZESS) 145 MCG CAPS capsule TAKE 1 CAPSULE BY MOUTH DAILY BEFORE BREAKFAST 30 capsule 5   naloxone (NARCAN) nasal spray 4 mg/0.1 mL Place into the nose as needed. overdose     olmesartan (BENICAR) 5 MG tablet TAKE ONE TABLET BY MOUTH TWO TIMES DAILY 180 tablet 2   Pitavastatin Calcium 1 MG TABS Take 1 tablet (1 mg total) by mouth 2 (two) times a week. 24 tablet 2   SUMAtriptan (IMITREX) 50 MG tablet Take 1 tablet by mouth every 2 hours as needed for migraine. May repeat in 2 hours if headache persists or recurs. 10 tablet 1   Turmeric 500 MG CAPS Take 2 tablets by mouth daily.     venlafaxine XR (EFFEXOR-XR) 37.5 MG 24 hr capsule Take 1 capsule by mouth daily with breakfast. 30 capsule 3   No current facility-administered medications on file prior to visit.    Allergies  Allergen Reactions   Penicillins Anaphylaxis   Demerol [Meperidine] Nausea And Vomiting   Doxycycline Hyclate Nausea And Vomiting   Gabapentin Other (See Comments)    Malaise   Sulfa Antibiotics Hives   Atorvastatin     myalgias   Fluvastatin     myalgia   Levaquin [Levofloxacin]      Reported some possible facial swelling with administration 05/2018   Lovastatin     myalgia   No Known Allergies     Other reaction(s): Dizziness   Pitavastatin     myalgia   Pravastatin  myalgia   Rosuvastatin     myalgia   Simvastatin     myalgia   Benadryl [Diphenhydramine Hcl] Palpitations and Rash   Buprenorphine Hcl Nausea And Vomiting   Cephalexin Rash   Morphine And Codeine Nausea And Vomiting   Spiriva Handihaler [Tiotropium Bromide Monohydrate] Rash     Assessment/Plan:  1. Weight loss - Patient's BMI today 28.36 classifying her as overweight.  Has made diet changes over past year and is interested in weight loss medications. Due to history of CVA, recommend Wegovy.  Using demo pen, educated patient on mechanism of action, storage, site selection, and administration.  Confirmed patient pregnant and no personal or family history of medullary thyroid carcinoma (MTC) or Multiple Endocrine Neoplasia syndrome type 2 (MEN 2). Injection technique reviewed at today's visit.  Advised patient on common side effects including nausea, diarrhea, dyspepsia, decreased appetite, and fatigue. Counseled patient on reducing meal size and how to titrate medication to minimize side effects. Counseled patient to call if intolerable side effects or if experiencing dehydration, abdominal pain, or dizziness. Patient will adhere to dietary modifications and will target at least 150 minutes of moderate intensity exercise weekly.   Follow up in 1 month via telephone for tolerability update and dose titration.   Will complete PA and contact patient with result  Laural Golden, PharmD, BCACP, CDCES, CPP [address] [place], [place], [address] Phone: [phone], Fax: [phone]

## 2024-03-01 NOTE — Patient Instructions (Addendum)
 It was good seeing you again  The medications we talked about today are called Wegovy and Zepbound. Both are once weekly injections  I will complete the prior authorizations for you and contact you with the result  Try to focus on vegetables, lean proteins, whole grains, and healthy fats  Work on increasing your physical activity up to at least 30 minutes a day at least 5 days a week  Please give [place] a call with any questions  Laural Golden, PharmD, BCACP, CDCES, CPP [address], [address] [place], [place], [address] Phone: [phone] , Fax: [phone]

## 2024-03-01 NOTE — Telephone Encounter (Signed)
 Pharmacy Patient Advocate Encounter   Received notification from Pt Calls Messages that prior authorization for [facility] is required/requested.   Insurance verification completed.   The patient is insured through [facility] .   Per test claim: PA required; PA submitted to above mentioned insurance via CoverMyMeds Key/confirmation #/EOC [id number] Status is pending

## 2024-03-02 ENCOUNTER — Other Ambulatory Visit (HOSPITAL_BASED_OUTPATIENT_CLINIC_OR_DEPARTMENT_OTHER): Payer: Self-pay

## 2024-03-02 ENCOUNTER — Other Ambulatory Visit (HOSPITAL_COMMUNITY): Payer: Self-pay

## 2024-03-02 MED ORDER — WEGOVY 0.25 MG/0.5ML ~~LOC~~ SOAJ
0.2500 mg | SUBCUTANEOUS | 0 refills | Status: AC
  Filled 2024-03-02 – 2024-03-28 (×2): qty 2, 28d supply, fill #0

## 2024-03-02 NOTE — Telephone Encounter (Signed)
 Pharmacy Patient Advocate Encounter  Received notification from [facility] that Prior Authorization for Diana [name] has been APPROVED from 02/16/24 to until further notice. Ran test claim, Copay is $0.00- one month. This test claim was processed through [facility]- copay amounts may vary at other pharmacies due to pharmacy/plan contracts, or as the patient moves through the different stages of their insurance plan.   PA #/Case ID/Reference #: [id number]

## 2024-03-02 NOTE — Addendum Note (Signed)
 Addended by: Cheree Ditto on: 03/02/2024 11:10 AM   Modules accepted: Orders

## 2024-03-14 ENCOUNTER — Other Ambulatory Visit (HOSPITAL_BASED_OUTPATIENT_CLINIC_OR_DEPARTMENT_OTHER): Payer: Self-pay

## 2024-03-28 ENCOUNTER — Other Ambulatory Visit (HOSPITAL_BASED_OUTPATIENT_CLINIC_OR_DEPARTMENT_OTHER): Payer: Self-pay

## 2024-04-04 ENCOUNTER — Telehealth: Payer: Self-pay | Admitting: Cardiology

## 2024-04-04 ENCOUNTER — Other Ambulatory Visit (HOSPITAL_BASED_OUTPATIENT_CLINIC_OR_DEPARTMENT_OTHER): Payer: Self-pay

## 2024-04-04 NOTE — Telephone Encounter (Signed)
 Left a message to call back.

## 2024-04-04 NOTE — Telephone Encounter (Signed)
 Pt c/o medication issue:  1. Name of Medication: Semaglutide -Weight Management (WEGOVY ) 0.25 MG/0.5ML SOAJ   2. How are you currently taking this medication (dosage and times per day)? As written  3. Are you having a reaction (difficulty breathing--STAT)? No  4. What is your medication issue? Nausea, fatigue, cramping and muscle tightness

## 2024-04-05 NOTE — Telephone Encounter (Signed)
 Started Wegovy -first injection 4 days ago- reports tiredness the next day, yesterday had decreased appetite, nausea, pressure at top of stomach "right under my boobs," did vomit and didn't feel any relief. States she's having muscle spasms in her legs and "my big toe is bending over and hurting so bad." Denies feeling anything like this prior to Wegovy  use. Nausea is still so bad and she has tried zofran  with no relief. States "it feels like something is in my stomach and is trying to get out." Advised most of this sounds pretty standard (except for the toe/LE cramping) but would speak with PharmD for formal input.

## 2024-04-06 NOTE — Telephone Encounter (Signed)
 Pavero, [name], [facility]  You15 minutes ago (5:24 PM)    I would expect them to subside soon since the first injection was 4 days ago. As for the muscle spasms, recommend she follow up with her PCP   You  Cv Div Pharmd17 minutes ago (5:21 PM)    Can you give a little more info-when she should expect this to subside? Any suggestions on what to do to alleviate?   Pavero, [name], [facility]  You8 hours ago (8:49 AM)    Agree, the GI side effects sound typical for starting a GLP1a. However the muscle spasms and toe pain are unlikely due to Wegovy    Called patient back and advised her of the above. She states that today is some better, but still really nauseous. She states that Chris Pavero told her that she could use zofran  while on this and is wanting a prescription called in. Will route to him to advise.

## 2024-04-07 NOTE — Telephone Encounter (Signed)
 Pavero, [name], [facility]  Keller Patella, RN Caller: Unspecified (3 days ago, 12:41 PM) Hi Daishaun Ayre, I do not recall saying she could take Zofran  but she is able to. Unfortunately that is out of my scope to send in to her pharmacy  Called and spoke with patient. She's appreciative of the call back and says she will reach out to PCP for zofran .

## 2024-05-02 ENCOUNTER — Other Ambulatory Visit (HOSPITAL_COMMUNITY): Payer: Self-pay

## 2024-05-02 ENCOUNTER — Telehealth: Payer: Self-pay

## 2024-05-02 NOTE — Telephone Encounter (Signed)
 Pharmacy Patient Advocate Encounter  Received notification from [facility] that Prior Authorization for Praluent  has been APPROVED from 04/18/24 to 11/30/98. Ran test claim, Copay is $0. This test claim was processed through [facility]- copay amounts may vary at other pharmacies due to pharmacy/plan contracts, or as the patient moves through the different stages of their insurance plan.

## 2024-05-02 NOTE — Telephone Encounter (Signed)
 Pharmacy Patient Advocate Encounter   Received notification from CoverMyMeds that prior authorization for PRALUENT  is required/requested.   Insurance verification completed.   The patient is insured through [facility] [place] .   Per test claim: PA required; PA submitted to above mentioned insurance via CoverMyMeds Key/confirmation #/EOC [id number] Status is pending

## 2024-06-06 ENCOUNTER — Other Ambulatory Visit: Payer: Self-pay

## 2024-06-06 DIAGNOSIS — F419 Anxiety disorder, unspecified: Secondary | ICD-10-CM

## 2024-06-24 ENCOUNTER — Other Ambulatory Visit (HOSPITAL_BASED_OUTPATIENT_CLINIC_OR_DEPARTMENT_OTHER): Payer: Self-pay

## 2024-07-02 ENCOUNTER — Encounter: Payer: Self-pay | Admitting: Gastroenterology

## 2024-08-09 ENCOUNTER — Other Ambulatory Visit: Payer: Self-pay | Admitting: Cardiology

## 2024-08-31 ENCOUNTER — Other Ambulatory Visit: Payer: Self-pay | Admitting: Cardiology

## 2024-08-31 MED ORDER — OLMESARTAN MEDOXOMIL 5 MG PO TABS: 5.0000 mg | ORAL_TABLET | Freq: Two times a day (BID) | ORAL | 1 refills | Status: AC

## 2024-09-02 NOTE — Telephone Encounter (Signed)
 Per pt's insurance please send a 90 day supply.

## 2024-12-16 ENCOUNTER — Other Ambulatory Visit: Payer: Self-pay | Admitting: Cardiology

## 2024-12-16 DIAGNOSIS — E7800 Pure hypercholesterolemia, unspecified: Secondary | ICD-10-CM

## 2024-12-16 DIAGNOSIS — E782 Mixed hyperlipidemia: Secondary | ICD-10-CM

## 2024-12-16 DIAGNOSIS — I2510 Atherosclerotic heart disease of native coronary artery without angina pectoris: Secondary | ICD-10-CM
